# Patient Record
Sex: Male | Born: 1970 | Race: Black or African American | Hispanic: No | Marital: Single | Smoking: Former smoker
Health system: Southern US, Community
[De-identification: ages and names within clinical notes are randomized; demographics above are authoritative.]

## PROBLEM LIST (undated history)

## (undated) DIAGNOSIS — Z9114 Patient's other noncompliance with medication regimen: Secondary | ICD-10-CM

## (undated) DIAGNOSIS — I340 Nonrheumatic mitral (valve) insufficiency: Secondary | ICD-10-CM

## (undated) DIAGNOSIS — T148XXA Other injury of unspecified body region, initial encounter: Secondary | ICD-10-CM

## (undated) DIAGNOSIS — I428 Other cardiomyopathies: Secondary | ICD-10-CM

## (undated) DIAGNOSIS — N189 Chronic kidney disease, unspecified: Secondary | ICD-10-CM

## (undated) DIAGNOSIS — K759 Inflammatory liver disease, unspecified: Secondary | ICD-10-CM

## (undated) DIAGNOSIS — Q780 Osteogenesis imperfecta: Secondary | ICD-10-CM

## (undated) DIAGNOSIS — Z993 Dependence on wheelchair: Secondary | ICD-10-CM

## (undated) DIAGNOSIS — I5022 Chronic systolic (congestive) heart failure: Secondary | ICD-10-CM

## (undated) DIAGNOSIS — I10 Essential (primary) hypertension: Secondary | ICD-10-CM

## (undated) DIAGNOSIS — R0600 Dyspnea, unspecified: Secondary | ICD-10-CM

## (undated) DIAGNOSIS — Z91148 Patient's other noncompliance with medication regimen for other reason: Secondary | ICD-10-CM

## (undated) DIAGNOSIS — I272 Pulmonary hypertension, unspecified: Secondary | ICD-10-CM

## (undated) DIAGNOSIS — G4730 Sleep apnea, unspecified: Secondary | ICD-10-CM

## (undated) HISTORY — PX: LEG SURGERY: SHX1003

## (undated) HISTORY — PX: CHOLECYSTECTOMY: SHX55

## (undated) HISTORY — PX: VASCULAR SURGERY: SHX849

---

## 2002-05-10 ENCOUNTER — Encounter: Payer: Self-pay | Admitting: Family Medicine

## 2002-05-10 ENCOUNTER — Encounter: Payer: Self-pay | Admitting: General Surgery

## 2002-05-10 ENCOUNTER — Inpatient Hospital Stay (HOSPITAL_COMMUNITY): Admission: EM | Admit: 2002-05-10 | Discharge: 2002-05-16 | Payer: Self-pay | Admitting: Emergency Medicine

## 2002-05-10 ENCOUNTER — Ambulatory Visit (HOSPITAL_COMMUNITY): Admission: RE | Admit: 2002-05-10 | Discharge: 2002-05-10 | Payer: Self-pay | Admitting: Family Medicine

## 2010-04-20 ENCOUNTER — Emergency Department (HOSPITAL_COMMUNITY)
Admission: EM | Admit: 2010-04-20 | Discharge: 2010-04-20 | Payer: Self-pay | Source: Home / Self Care | Admitting: Emergency Medicine

## 2010-04-20 LAB — POCT I-STAT, CHEM 8
BUN: 17 mg/dL (ref 6–23)
Calcium, Ion: 1.11 mmol/L — ABNORMAL LOW (ref 1.12–1.32)
Creatinine, Ser: 1 mg/dL (ref 0.4–1.5)
Glucose, Bld: 93 mg/dL (ref 70–99)
HCT: 53 % — ABNORMAL HIGH (ref 39.0–52.0)
Hemoglobin: 18 g/dL — ABNORMAL HIGH (ref 13.0–17.0)
Potassium: 3.9 mEq/L (ref 3.5–5.1)
Sodium: 144 mEq/L (ref 135–145)
TCO2: 30 mmol/L (ref 0–100)

## 2011-04-24 ENCOUNTER — Emergency Department (HOSPITAL_COMMUNITY): Payer: Medicare Other

## 2011-04-24 ENCOUNTER — Other Ambulatory Visit: Payer: Self-pay

## 2011-04-24 ENCOUNTER — Emergency Department (HOSPITAL_COMMUNITY)
Admission: EM | Admit: 2011-04-24 | Discharge: 2011-04-24 | Disposition: A | Discharge status: Home / Self Care | Payer: Medicare Other | Attending: Emergency Medicine | Admitting: Emergency Medicine

## 2011-04-24 ENCOUNTER — Encounter (HOSPITAL_COMMUNITY): Payer: Self-pay | Admitting: Emergency Medicine

## 2011-04-24 DIAGNOSIS — E876 Hypokalemia: Secondary | ICD-10-CM | POA: Diagnosis not present

## 2011-04-24 DIAGNOSIS — R0602 Shortness of breath: Secondary | ICD-10-CM | POA: Diagnosis not present

## 2011-04-24 DIAGNOSIS — R42 Dizziness and giddiness: Secondary | ICD-10-CM | POA: Diagnosis not present

## 2011-04-24 DIAGNOSIS — R930 Abnormal findings on diagnostic imaging of skull and head, not elsewhere classified: Secondary | ICD-10-CM | POA: Insufficient documentation

## 2011-04-24 DIAGNOSIS — I517 Cardiomegaly: Secondary | ICD-10-CM | POA: Diagnosis not present

## 2011-04-24 DIAGNOSIS — R51 Headache: Secondary | ICD-10-CM | POA: Insufficient documentation

## 2011-04-24 DIAGNOSIS — I10 Essential (primary) hypertension: Secondary | ICD-10-CM | POA: Insufficient documentation

## 2011-04-24 DIAGNOSIS — H538 Other visual disturbances: Secondary | ICD-10-CM | POA: Insufficient documentation

## 2011-04-24 DIAGNOSIS — R6889 Other general symptoms and signs: Secondary | ICD-10-CM | POA: Insufficient documentation

## 2011-04-24 DIAGNOSIS — I498 Other specified cardiac arrhythmias: Secondary | ICD-10-CM | POA: Insufficient documentation

## 2011-04-24 HISTORY — DX: Essential (primary) hypertension: I10

## 2011-04-24 HISTORY — DX: Osteogenesis imperfecta: Q78.0

## 2011-04-24 LAB — POCT I-STAT, CHEM 8
Calcium, Ion: 1.19 mmol/L (ref 1.12–1.32)
Chloride: 100 mEq/L (ref 96–112)
HCT: 51 % (ref 39.0–52.0)
Potassium: 3.3 mEq/L — ABNORMAL LOW (ref 3.5–5.1)
Sodium: 143 mEq/L (ref 135–145)

## 2011-04-24 LAB — POCT I-STAT TROPONIN I: Troponin i, poc: 0 ng/mL (ref 0.00–0.08)

## 2011-04-24 LAB — URINALYSIS, ROUTINE W REFLEX MICROSCOPIC
Glucose, UA: NEGATIVE mg/dL
Hgb urine dipstick: NEGATIVE
pH: 6 (ref 5.0–8.0)

## 2011-04-24 LAB — URINE MICROSCOPIC-ADD ON

## 2011-04-24 MED ORDER — LORAZEPAM 1 MG PO TABS
1.0000 mg | ORAL_TABLET | Freq: Once | ORAL | Status: AC
Start: 2011-04-24 — End: 2011-04-24
  Administered 2011-04-24: 1 mg via ORAL

## 2011-04-24 MED ORDER — LORAZEPAM 1 MG PO TABS
ORAL_TABLET | ORAL | Status: AC
  Filled 2011-04-24: qty 1

## 2011-04-24 NOTE — ED Notes (Signed)
Pt alert, c/o dizziness, blurred vision, head aches, also c/o nausea, denies emesis, onset a few days ago, resp even unlabored, skin pwd,

## 2011-04-24 NOTE — ED Notes (Signed)
Pt st's he feels dizzy and lightheaded, blurred vision, however pt st's he hasn't taken his bp medications in over one year.  No n/v/d, no chest pain, no sob.  No headache right now, st's he has them off and on.  No abdominal pain

## 2011-04-24 NOTE — ED Notes (Signed)
Pt to MRI

## 2011-04-24 NOTE — ED Provider Notes (Signed)
Pt care assumed from Dr Thurnell Garbe in sign out with MRI pending. MRI does not show any acute abnormality. Pt with no new complaints prior to DC. Renal function normal. Will start on HCTZ given HTN. Further BP management more appropriate for outpt. Discussed importance of med compliance. Pt instructed to increase dietary intake of potassium.    Dg Chest 2 View  04/24/2011  *RADIOLOGY REPORT*  Clinical Data: Shortness of breath  CHEST - 2 VIEW  Comparison: 04/20/2010  Findings: The shallow inspiration.  Heart size and pulmonary vascularity are normal.  No focal airspace consolidation.  No blunting of costophrenic angles.  No pneumothorax.  Tortuous aorta. No significant change since previous study.  IMPRESSION: Mild cardiac enlargement.  No evidence of active pulmonary disease.  Original Report Authenticated By: Neale Burly, M.D.   Ct Head Wo Contrast  04/24/2011  *RADIOLOGY REPORT*  Clinical Data: Dizziness, headache, blurred vision, and lightheadedness.  History of osteogenesis imperfecta.  CT HEAD WITHOUT CONTRAST  Technique:  Contiguous axial images were obtained from the base of the skull through the vertex without contrast.  Comparison: None  Findings: The ventricles and sulci appear symmetrical.  No mass effect or midline shift.  No abnormal extra-axial fluid collections. Suggestion of low attenuation change in the brain stem and cerebellar peduncles.  Mild cerebellar tonsillar ectopia. These changes may represent artifact, but there is suggestion of some effacement of the fourth ventricle and of the basal cisterns which might be due to edema causing mass effect.  Gray-white matter junctions in the cerebral hemispheres are intact.  Ventricles are not dilated.  Suggest MRI for further evaluation.  No depressed skull fractures.  Visualized paranasal sinuses are not opacified.  IMPRESSION: Suggestion of low attenuation change in the brainstem with some mild mass effect on the fourth ventricle and  quadrigeminal cisterns. This could represent artifact but ischemia and edema is not excluded and MRI is recommended for further evaluation.  Results were discussed with Dr. Thurnell Garbe at the time of dictation, 0513 hours on 04/24/2011.  Original Report Authenticated By: Neale Burly, M.D.   Mr Brain Wo Contrast  04/24/2011  *RADIOLOGY REPORT*  Clinical Data: Dizzy.  Headache and lightheadedness.  Abnormal CT scan of the head 04/23/2010.  MRI HEAD WITHOUT CONTRAST  Technique:  Multiplanar, multiecho pulse sequences of the brain and surrounding structures were obtained according to standard protocol without intravenous contrast.  Comparison: None.  Findings: Central pontine T2 and FLAIR white matter hyperintensity corresponds with the finding at CT.  No focal mass lesion is present.  The fourth ventricle is of normal size.  Extensive periventricular and subcortical white matter changes are evident bilaterally.  No hemorrhage or mass lesion is present.  The diffusion weighted images reveal no evidence for acute or subacute infarction.  Marrow signal is somewhat low within the upper cervical spine.  The adenoid tissue is prominent for age.  Flow is present in the major intracranial arteries.  The globes and orbits are intact.  The paranasal sinuses and mastoid air cells are clear.  IMPRESSION:  1.  No acute infarct or significant intracranial abnormality to explain the patient's symptoms. 2.  Diffuse white matter disease.  This is advanced for age. The finding is nonspecific but can be seen in the setting of chronic microvascular ischemia, a demyelinating process such as multiple sclerosis, vasculitis, complicated migraine headaches, or as the sequelae of a prior infectious or inflammatory process. 3.  Prominent adenoid tissue.  No discrete mass lesion is  present. The area should be amendable to direct visualization. Although nonspecific, prominent adenoid tissue can be seen in the setting of HIV. 4.  Decreased T1  marrow signal.  Question anemia.  Original Report Authenticated By: Resa Miner. MATTERN, M.D.    Virgel Manifold, MD 04/24/11 [phone]

## 2011-04-24 NOTE — ED Notes (Addendum)
Patient transported to MRI. Will re-check vitals when pt returns.

## 2011-04-24 NOTE — ED Provider Notes (Signed)
History     CSN: [id number]  Arrival date & time 04/24/11  0243   Chief Complaint  Patient presents with  . Blurred Vision  . Dizziness    HPI Pt was seen at 0445.  Per pt, c/o gradual onset and persistence of constant "high blood pressure" for over the past year.  Pt states he has not taken his BP meds in over 1 year, unk name of last med he took.  States for the past several weeks he has been experiencing intermittent vague headaches, "blurred" vision, and lightheadedness.  Denies symptoms currently.   Denies CP/SOB, no cough, no fevers, no abd pain, no back pain, no focal motor weakness, no tingling/numbness in extremities.    Past Medical History  Diagnosis Date  . Hypertension   . Osteogenesis imperfecta     History reviewed. No pertinent past surgical history.   History  Substance Use Topics  . Smoking status: Never Smoker   . Smokeless tobacco: Not on file  . Alcohol Use: No    Review of Systems ROS: Statement: All systems negative except as marked or noted in the HPI; Constitutional: Negative for fever and chills. ; ; Eyes: Negative for eye pain, redness and discharge. ; ; ENMT: Negative for ear pain, hoarseness, nasal congestion, sinus pressure and sore throat. ; ; Cardiovascular: Negative for chest pain, palpitations, diaphoresis, dyspnea and peripheral edema. ; ; Respiratory: Negative for cough, wheezing and stridor. ; ; Gastrointestinal: Negative for nausea, vomiting, diarrhea, abdominal pain, blood in stool, hematemesis, jaundice and rectal bleeding. . ; ; Genitourinary: Negative for dysuria, flank pain and hematuria. ; ; Musculoskeletal: Negative for back pain and neck pain. Negative for swelling and trauma.; ; Skin: Negative for pruritus, rash, abrasions, blisters, bruising and skin lesion.; ; Neuro: Positive for intermittent headache, lightheadedness and negative for neck stiffness. Negative for weakness, altered level of consciousness , altered mental status,  extremity weakness, paresthesias, involuntary movement, seizure and syncope.      Allergies  Review of patient's allergies indicates no known allergies.  Home Medications  No current outpatient prescriptions on file.  BP 223/143  Pulse 112  Temp(Src) 98.7 F (37.1 C) (Oral)  SpO2 97%  Physical Exam 0455: Physical examination:  Nursing notes reviewed; Vital signs and O2 SAT reviewed;  Constitutional: Well developed, Well nourished, Well hydrated, In no acute distress; Head:  Normocephalic, atraumatic; Eyes: EOMI, PERRL, No scleral icterus; ENMT: Mouth and pharynx normal, Mucous membranes moist; Neck: Supple, Full range of motion, No lymphadenopathy; Cardiovascular: Regular rate and rhythm, No murmur, rub, or gallop; Respiratory: Breath sounds clear & equal bilaterally, No rales, rhonchi, wheezes, or rub, Normal respiratory effort/excursion; Chest: Nontender, Movement normal; Abdomen: Soft, Nontender, Nondistended, Normal bowel sounds; Genitourinary: No CVA tenderness; Extremities: Pulses normal, No tenderness, No edema, No calf edema or asymmetry.; Neuro: AA&Ox3, Major CN grossly intact.  No facial droop, speech clear, moves ext on stretcher..; Skin: Color normal, Warm, Dry, no rash.    ED Course  Procedures  0520:  Pt not orthostatic.  Will order MRI brain to f/u CT scan findings.    7:19 AM:  MRI brain pending.  Pt was medicated with ativan PO before MRI for anxiety.  Sign out to Dr. Wilson Singer.    MDM  MDM Reviewed: nursing note and vitals Reviewed previous: ECG Interpretation: ECG, labs, x-ray and CT scan    Date: 04/24/2011  Rate: 101  Rhythm: sinus tachycardia  QRS Axis: normal  Intervals: normal  ST/T Wave abnormalities:  normal  Conduction Disutrbances:nonspecific intraventricular conduction delay  Narrative Interpretation:  LVH with repolarization abnormality  Old EKG Reviewed: unchanged; no significant changes from previous EKG dated 04/20/2010.   Results for orders  placed during the hospital encounter of 04/24/11  URINALYSIS, ROUTINE W REFLEX MICROSCOPIC      Component Value Range   Color, Urine AMBER (*) YELLOW    APPearance CLEAR  CLEAR    Specific Gravity, Urine 1.023  1.005 - 1.030    pH 6.0  5.0 - 8.0    Glucose, UA NEGATIVE  NEGATIVE (mg/dL)   Hgb urine dipstick NEGATIVE  NEGATIVE    Bilirubin Urine SMALL (*) NEGATIVE    Ketones, ur TRACE (*) NEGATIVE (mg/dL)   Protein, ur 100 (*) NEGATIVE (mg/dL)   Urobilinogen, UA 1.0  0.0 - 1.0 (mg/dL)   Nitrite NEGATIVE  NEGATIVE    Leukocytes, UA NEGATIVE  NEGATIVE   URINE MICROSCOPIC-ADD ON      Component Value Range   WBC, UA 0-2  <3 (WBC/hpf)   Bacteria, UA FEW (*) RARE    Casts HYALINE CASTS (*) NEGATIVE    Urine-Other MUCOUS PRESENT    POCT I-STAT, CHEM 8      Component Value Range   Sodium 143  135 - 145 (mEq/L)   Potassium 3.3 (*) 3.5 - 5.1 (mEq/L)   Chloride 100  96 - 112 (mEq/L)   BUN 10  6 - 23 (mg/dL)   Creatinine, Ser 0.90  0.50 - 1.35 (mg/dL)   Glucose, Bld 81  70 - 99 (mg/dL)   Calcium, Ion 1.19  1.12 - 1.32 (mmol/L)   TCO2 26  0 - 100 (mmol/L)   Hemoglobin 17.3 (*) 13.0 - 17.0 (g/dL)   HCT 51.0  39.0 - 52.0 (%)  POCT I-STAT TROPONIN I      Component Value Range   Troponin i, poc 0.00  0.00 - 0.08 (ng/mL)   Comment 3            Dg Chest 2 View 04/24/2011  *RADIOLOGY REPORT*  Clinical Data: Shortness of breath  CHEST - 2 VIEW  Comparison: 04/20/2010  Findings: The shallow inspiration.  Heart size and pulmonary vascularity are normal.  No focal airspace consolidation.  No blunting of costophrenic angles.  No pneumothorax.  Tortuous aorta. No significant change since previous study.  IMPRESSION: Mild cardiac enlargement.  No evidence of active pulmonary disease.  Original Report Authenticated By: Neale Burly, M.D.    Ct Head Wo Contrast 04/24/2011  *RADIOLOGY REPORT*  Clinical Data: Dizziness, headache, blurred vision, and lightheadedness.  History of osteogenesis imperfecta.   CT HEAD WITHOUT CONTRAST  Technique:  Contiguous axial images were obtained from the base of the skull through the vertex without contrast.  Comparison: None  Findings: The ventricles and sulci appear symmetrical.  No mass effect or midline shift.  No abnormal extra-axial fluid collections. Suggestion of low attenuation change in the brain stem and cerebellar peduncles.  Mild cerebellar tonsillar ectopia. These changes may represent artifact, but there is suggestion of some effacement of the fourth ventricle and of the basal cisterns which might be due to edema causing mass effect.  Gray-white matter junctions in the cerebral hemispheres are intact.  Ventricles are not dilated.  Suggest MRI for further evaluation.  No depressed skull fractures.  Visualized paranasal sinuses are not opacified.  IMPRESSION: Suggestion of low attenuation change in the brainstem with some mild mass effect on the fourth ventricle and quadrigeminal cisterns. This  could represent artifact but ischemia and edema is not excluded and MRI is recommended for further evaluation.  Results were discussed with Dr. Thurnell Garbe at the time of dictation, 0513 hours on 04/24/2011.  Original Report Authenticated By: Neale Burly, M.D.              [facility] [address] Darnelle Spangle, DO 04/24/11 1958

## 2011-04-25 LAB — URINE CULTURE
Colony Count: NO GROWTH
Culture  Setup Time: 201302011011

## 2011-05-26 ENCOUNTER — Emergency Department (HOSPITAL_COMMUNITY)
Admission: EM | Admit: 2011-05-26 | Discharge: 2011-05-27 | Disposition: A | Discharge status: Home / Self Care | Payer: Medicare Other | Attending: Emergency Medicine | Admitting: Emergency Medicine

## 2011-05-26 ENCOUNTER — Other Ambulatory Visit: Payer: Self-pay

## 2011-05-26 ENCOUNTER — Emergency Department (HOSPITAL_COMMUNITY): Payer: Medicare Other

## 2011-05-26 ENCOUNTER — Encounter (HOSPITAL_COMMUNITY): Payer: Self-pay | Admitting: Emergency Medicine

## 2011-05-26 DIAGNOSIS — S20219A Contusion of unspecified front wall of thorax, initial encounter: Secondary | ICD-10-CM | POA: Diagnosis not present

## 2011-05-26 DIAGNOSIS — I498 Other specified cardiac arrhythmias: Secondary | ICD-10-CM | POA: Insufficient documentation

## 2011-05-26 DIAGNOSIS — I10 Essential (primary) hypertension: Secondary | ICD-10-CM | POA: Diagnosis not present

## 2011-05-26 DIAGNOSIS — R079 Chest pain, unspecified: Secondary | ICD-10-CM | POA: Insufficient documentation

## 2011-05-26 DIAGNOSIS — X58XXXA Exposure to other specified factors, initial encounter: Secondary | ICD-10-CM | POA: Insufficient documentation

## 2011-05-26 DIAGNOSIS — Q780 Osteogenesis imperfecta: Secondary | ICD-10-CM | POA: Diagnosis not present

## 2011-05-26 LAB — URINALYSIS, ROUTINE W REFLEX MICROSCOPIC
Nitrite: NEGATIVE
Specific Gravity, Urine: 1.028 (ref 1.005–1.030)
Urobilinogen, UA: 1 mg/dL (ref 0.0–1.0)

## 2011-05-26 LAB — POCT I-STAT, CHEM 8
Calcium, Ion: 1.23 mmol/L (ref 1.12–1.32)
Chloride: 105 mEq/L (ref 96–112)
Creatinine, Ser: 0.9 mg/dL (ref 0.50–1.35)
Glucose, Bld: 106 mg/dL — ABNORMAL HIGH (ref 70–99)
HCT: 45 % (ref 39.0–52.0)

## 2011-05-26 LAB — CBC
MCH: 28.6 pg (ref 26.0–34.0)
Platelets: 241 10*3/uL (ref 150–400)
RBC: 4.72 MIL/uL (ref 4.22–5.81)
WBC: 8 10*3/uL (ref 4.0–10.5)

## 2011-05-26 LAB — BASIC METABOLIC PANEL
GFR calc Af Amer: >90 mL/min (ref >90)
GFR calc non Af Amer: >90 mL/min (ref >90)
Glucose, Bld: 103 mg/dL — ABNORMAL HIGH (ref 70–99)
Potassium: 3.8 mEq/L (ref 3.5–5.1)
Sodium: 141 mEq/L (ref 135–145)

## 2011-05-26 LAB — URINE MICROSCOPIC-ADD ON

## 2011-05-26 LAB — DIFFERENTIAL
Basophils Relative: 0 % (ref 0–1)
Eosinophils Absolute: 0.2 10*3/uL (ref 0.0–0.7)
Lymphs Abs: 3.4 10*3/uL (ref 0.7–4.0)
Neutro Abs: 3.6 10*3/uL (ref 1.7–7.7)
Neutrophils Relative %: 45 % (ref 43–77)

## 2011-05-26 MED ORDER — KETOROLAC TROMETHAMINE 30 MG/ML IJ SOLN
30.0000 mg | Freq: Once | INTRAMUSCULAR | Status: AC
  Administered 2011-05-26: 30 mg via INTRAVENOUS
  Filled 2011-05-26: qty 1

## 2011-05-26 MED ORDER — HYDRALAZINE HCL 20 MG/ML IJ SOLN
5.0000 mg | Freq: Once | INTRAMUSCULAR | Status: AC
  Administered 2011-05-26: 5 mg via INTRAVENOUS
  Filled 2011-05-26: qty 0.25

## 2011-05-26 NOTE — ED Notes (Signed)
MD at bedside. Dr. Randal Buba at bedside.

## 2011-05-26 NOTE — ED Provider Notes (Signed)
History     CSN: [id number]  Arrival date & time 05/26/11  2219   First MD Initiated Contact with Patient 05/26/11 2306      Chief Complaint  Patient presents with  . Abdominal Pain    (Consider location/radiation/quality/duration/timing/severity/associated sxs/prior treatment) Patient is a 41 y.o. male presenting with chest pain. The history is provided by the patient. No language interpreter was used.  Chest Pain The chest pain began 1 - 2 weeks ago. Duration of episode(s) is 7 days. Chest pain occurs constantly. The chest pain is unchanged. Associated with: nothing. At its most intense, the pain is at 10/10. The pain is currently at 10/10. The severity of the pain is severe. The quality of the pain is described as sharp (last 3 ribs anterior axillary line are tender to palpation). The pain does not radiate. Exacerbated by: movement and touching the area. Pertinent negatives for primary symptoms include no fever, no fatigue, no syncope, no shortness of breath, no cough, no wheezing, no palpitations, no abdominal pain, no nausea, no vomiting, no dizziness and no altered mental status.  Pertinent negatives for associated symptoms include no claudication and no diaphoresis. He tried nothing for the symptoms. Risk factors include male gender.  Pertinent negatives for past medical history include no COPD, no CHF and no diabetes. Past medical history comments: osteogenesis imperfecta  Pertinent negatives for family medical history include: no Marfan's syndrome in family.  Procedure history is negative for cardiac catheterization.     Past Medical History  Diagnosis Date  . Hypertension   . Osteogenesis imperfecta     History reviewed. No pertinent past surgical history.  No family history on file.  History  Substance Use Topics  . Smoking status: Never Smoker   . Smokeless tobacco: Not on file  . Alcohol Use: No      Review of Systems  Constitutional: Negative for fever,  diaphoresis and fatigue.  HENT: Negative.   Eyes: Negative.   Respiratory: Negative for cough, chest tightness, shortness of breath and wheezing.   Cardiovascular: Positive for chest pain. Negative for palpitations, claudication and syncope.  Gastrointestinal: Negative for nausea, vomiting and abdominal pain.  Genitourinary: Negative.   Musculoskeletal: Negative.   Skin: Negative.   Neurological: Negative for dizziness.  Hematological: Negative.   Psychiatric/Behavioral: Negative.  Negative for altered mental status.    Allergies  Review of patient's allergies indicates no known allergies.  Home Medications   Current Outpatient Rx  Name Route Sig Dispense Refill  . IBUPROFEN 200 MG PO TABS Oral Take 800 mg by mouth every 8 (eight) hours as needed. For pain.      BP 207/126  Pulse 98  Temp 98 F (36.7 C)  Resp 16  SpO2 93%  Physical Exam  Constitutional: He is oriented to person, place, and time. He appears well-developed and well-nourished. No distress.  HENT:  Head: Normocephalic and atraumatic.  Mouth/Throat: Oropharynx is clear and moist.  Eyes: Conjunctivae are normal. Pupils are equal, round, and reactive to light.  Neck: Normal range of motion. Neck supple.  Cardiovascular: Normal rate and regular rhythm.   Pulmonary/Chest: Effort normal and breath sounds normal. He has no wheezes. He has no rales. He exhibits tenderness.  Abdominal: Soft. Bowel sounds are normal. There is no tenderness. There is no rebound and no guarding.  Musculoskeletal: Normal range of motion. He exhibits no edema.  Neurological: He is alert and oriented to person, place, and time.  Skin: Skin is warm and  dry.  Psychiatric: He has a normal mood and affect.    ED Course  Procedures (including critical care time)  Labs Reviewed  URINALYSIS, ROUTINE W REFLEX MICROSCOPIC - Abnormal; Notable for the following:    Protein, ur 30 (*)    All other components within normal limits  POCT I-STAT,  CHEM 8 - Abnormal; Notable for the following:    Glucose, Bld 106 (*)    All other components within normal limits  URINE MICROSCOPIC-ADD ON  CBC  DIFFERENTIAL  BASIC METABOLIC PANEL  D-DIMER, QUANTITATIVE   No results found.   No diagnosis found.    MDM   Date: 05/27/2011  Rate: 103  Rhythm: sinus tachycardia  QRS Axis: normal  Intervals: normal  ST/T Wave abnormalities: nonspecific ST changes  Conduction Disutrbances:none  Narrative Interpretation: LVH  Old EKG Reviewed: unchanged   Return for CP, shortness of breath, cough, vomiting fevers or any concerns.  Follow up with a family doctor.  Patient verbalizes understanding and agrees to follow up      Issa Luster Alfonso Patten, MD 05/27/11 0202

## 2011-05-26 NOTE — ED Notes (Signed)
Patient transported to X-ray 

## 2011-05-26 NOTE — ED Notes (Signed)
Pt alert, nad, c/o left sided abd pain, onset a week ago, denies changes in bowel or bladder, denies trauma or injury, states pain radiates from side to abd, described as a cramp

## 2011-05-26 NOTE — ED Notes (Signed)
Pt states he has L sided rib pain which feels like when he broke a rib before. Pt denies trauma to rib.

## 2011-05-27 LAB — D-DIMER, QUANTITATIVE: D-Dimer, Quant: 0.22 ug{FEU}/mL (ref 0.00–0.48)

## 2011-05-27 MED ORDER — OXYCODONE-ACETAMINOPHEN 5-325 MG PO TABS: 1.0000 | ORAL_TABLET | Freq: Four times a day (QID) | ORAL | Status: AC | PRN

## 2011-05-27 MED ORDER — AMLODIPINE BESYLATE 2.5 MG PO TABS: 5.0000 mg | ORAL_TABLET | Freq: Every day | ORAL | Status: DC

## 2011-05-27 MED ORDER — HYDRALAZINE HCL 20 MG/ML IJ SOLN
5.0000 mg | Freq: Once | INTRAMUSCULAR | Status: AC
  Administered 2011-05-27: 5 mg via INTRAVENOUS
  Filled 2011-05-27: qty 0.25

## 2011-05-27 NOTE — Discharge Instructions (Signed)
Chest Contusion A contusion is a deep bruise. Bruises happen when an injury causes bleeding under the skin. Signs of bruising include pain, puffiness (swelling), and discolored skin. The bruise may turn blue, purple, or yellow. Pay attention to how you are doing. HOME CARE  Put ice on the injured area.   Put ice in a plastic bag.   Place a towel between the skin and the bag.   Leave the ice on for 15 to 20 minutes at a time, 3 to 4 times a day for the first 48 hours.   Rest.   Do not lift anything heavy.   Limit your activity as told by your doctor   Take 3 to 4 deep breaths every hour while awake. Hold your hand or a pillow over the sore area for support.   Breathe from the belly (abdomen).   Breathe in through the nose, as if you are smelling a flower.   Breathe out through the mouth, as if you are blowing out a candle.   Only take medicine as told by your doctor.  GET HELP RIGHT AWAY IF:   You have trouble breathing or cough up thick spit (mucus).   You have chest pain that goes into the arms or jaw.   The skin is wet and pale.   You have a fever.   You feel dizzy, weak, or pass out (faint).   You cannot breathe easily.   The bruise is getting worse.  MAKE SURE YOU:   Understand these instructions.   Will watch your condition.   Will get help right away if you are not doing well or get worse.  Document Released: 08/26/2007 Document Revised: 02/26/2011 Document Reviewed: 08/26/2007 [facility] Patient Information 2012 [place].

## 2011-05-27 NOTE — ED Notes (Signed)
MD at bedside. Dr. Randal Buba at bedside.

## 2012-01-16 ENCOUNTER — Encounter (HOSPITAL_COMMUNITY): Payer: Self-pay | Admitting: *Deleted

## 2012-01-16 ENCOUNTER — Emergency Department (HOSPITAL_COMMUNITY)
Admission: EM | Admit: 2012-01-16 | Discharge: 2012-01-17 | Disposition: A | Discharge status: Home / Self Care | Payer: Medicare Other | Attending: Emergency Medicine | Admitting: Emergency Medicine

## 2012-01-16 DIAGNOSIS — J029 Acute pharyngitis, unspecified: Secondary | ICD-10-CM

## 2012-01-16 DIAGNOSIS — Q780 Osteogenesis imperfecta: Secondary | ICD-10-CM | POA: Diagnosis not present

## 2012-01-16 DIAGNOSIS — I10 Essential (primary) hypertension: Secondary | ICD-10-CM | POA: Diagnosis not present

## 2012-01-16 DIAGNOSIS — R509 Fever, unspecified: Secondary | ICD-10-CM | POA: Diagnosis not present

## 2012-01-16 MED ORDER — IBUPROFEN 800 MG PO TABS: 800.0000 mg | ORAL_TABLET | Freq: Three times a day (TID) | ORAL | Status: DC

## 2012-01-16 MED ORDER — ACETAMINOPHEN 325 MG PO TABS
650.0000 mg | ORAL_TABLET | Freq: Once | ORAL | Status: AC
  Administered 2012-01-17: 650 mg via ORAL
  Filled 2012-01-16: qty 1

## 2012-01-16 MED ORDER — AMOXICILLIN 500 MG PO CAPS: 500.0000 mg | ORAL_CAPSULE | Freq: Three times a day (TID) | ORAL | Status: DC

## 2012-01-16 MED ORDER — CEFTRIAXONE SODIUM 1 G IJ SOLR
1.0000 g | Freq: Once | INTRAMUSCULAR | Status: AC
  Administered 2012-01-17: 1 g via INTRAMUSCULAR
  Filled 2012-01-16: qty 10

## 2012-01-16 NOTE — ED Notes (Signed)
Sore throat since Thursday. Fever and chills starting today.

## 2012-01-16 NOTE — ED Provider Notes (Signed)
History     CSN: [id number]  Arrival date & time 01/16/12  2026   First MD Initiated Contact with Patient 01/16/12 2330      Chief Complaint  Patient presents with  . Sore Throat  . Fever    (Consider location/radiation/quality/duration/timing/severity/associated sxs/prior treatment) HPI Hx per PT, Sore throat and fever, hurts to swallow, has h/o strep throat and feels the same.  No cough, no emesis, no trouble breathing. No rash.  Mod in severity. No known sick contacts. No known alleviating factors Past Medical History  Diagnosis Date  . Hypertension   . Osteogenesis imperfecta     History reviewed. No pertinent past surgical history.  History reviewed. No pertinent family history.  History  Substance Use Topics  . Smoking status: Never Smoker   . Smokeless tobacco: Not on file  . Alcohol Use: Yes     occasionally      Review of Systems  Constitutional: Positive for fever and chills.  HENT: Positive for sore throat. Negative for drooling, mouth sores, neck pain, neck stiffness and voice change.   Eyes: Negative for pain.  Respiratory: Negative for shortness of breath.   Cardiovascular: Negative for chest pain.  Gastrointestinal: Negative for abdominal pain.  Genitourinary: Negative for dysuria.  Musculoskeletal: Negative for back pain.  Skin: Negative for rash.  Neurological: Negative for headaches.  All other systems reviewed and are negative.    Allergies  Peanuts; Shellfish allergy; and Latex  Home Medications   Current Outpatient Rx  Name Route Sig Dispense Refill  . GUAIFENESIN 100 MG/5ML PO SYRP Oral Take 200 mg by mouth 3 (three) times daily as needed.    Marland Kitchen AMLODIPINE BESYLATE 2.5 MG PO TABS Oral Take 2 tablets (5 mg total) by mouth daily. 30 tablet 0  . AMOXICILLIN 500 MG PO CAPS Oral Take 1 capsule (500 mg total) by mouth 3 (three) times daily. 21 capsule 0  . IBUPROFEN 200 MG PO TABS Oral Take 800 mg by mouth every 8 (eight) hours as needed.  For pain.    . IBUPROFEN 800 MG PO TABS Oral Take 1 tablet (800 mg total) by mouth 3 (three) times daily. 21 tablet 0    BP 183/109  Pulse 62  Temp 100.9 F (38.3 C) (Oral)  Resp 22  SpO2 91%  Physical Exam  Constitutional: He is oriented to person, place, and time. He appears well-developed and well-nourished.  HENT:  Head: Normocephalic and atraumatic.       Bilateral tonsillar exudates and swelling, uvula midline. No trismus, mild associated cervical lymphadenopathy.  Eyes: EOM are normal. Pupils are equal, round, and reactive to light.  Neck: Neck supple. No tracheal deviation present.  Cardiovascular: Normal rate, regular rhythm and intact distal pulses.   Pulmonary/Chest: Effort normal. No stridor. No respiratory distress.  Musculoskeletal: Normal range of motion. He exhibits no edema.  Lymphadenopathy:    He has cervical adenopathy.  Neurological: He is alert and oriented to person, place, and time.  Skin: Skin is warm and dry.    ED Course  Procedures (including critical care time)  Tylenol. IM rocephin  1. Pharyngitis     MDM   Pharyngitis with strong h/o strep throat/ treated for same, multiple centor criteria present. VS and nursing notes reviewed. Tylenol and ABx given in ED. RX provided.   Reliable historian agrees to strict return precautions.         Teressa Lower, MD 01/16/12 2350

## 2012-01-17 MED ORDER — IBUPROFEN 800 MG PO TABS
ORAL_TABLET | ORAL | Status: AC
  Administered 2012-01-17: 800 mg
  Filled 2012-01-17: qty 1

## 2012-01-17 NOTE — ED Notes (Signed)
C/o sore throat and fever. Difficulty talking and swallowing x 2 days.

## 2012-01-17 NOTE — ED Notes (Signed)
Patient given discharge instructions, information, prescriptions, and diet order. Patient states that they adequately understand discharge information given and to return to ED if symptoms return or worsen.     

## 2012-01-17 NOTE — ED Notes (Signed)
Will speak with MD Marnette Burgess to make sure he would like patient discharged with elevated temp and elevated heart rate.

## 2012-02-23 ENCOUNTER — Encounter (HOSPITAL_COMMUNITY): Payer: Self-pay | Admitting: Emergency Medicine

## 2012-02-23 ENCOUNTER — Emergency Department (HOSPITAL_COMMUNITY): Payer: Medicare Other

## 2012-02-23 ENCOUNTER — Inpatient Hospital Stay (HOSPITAL_COMMUNITY)
Admission: EM | Admit: 2012-02-23 | Discharge: 2012-02-27 | DRG: 291 | Disposition: A | Discharge status: Home / Self Care | Payer: Medicare Other | Attending: Internal Medicine | Admitting: Internal Medicine

## 2012-02-23 DIAGNOSIS — I059 Rheumatic mitral valve disease, unspecified: Secondary | ICD-10-CM | POA: Diagnosis not present

## 2012-02-23 DIAGNOSIS — J189 Pneumonia, unspecified organism: Secondary | ICD-10-CM | POA: Diagnosis not present

## 2012-02-23 DIAGNOSIS — D649 Anemia, unspecified: Secondary | ICD-10-CM

## 2012-02-23 DIAGNOSIS — F17200 Nicotine dependence, unspecified, uncomplicated: Secondary | ICD-10-CM | POA: Diagnosis not present

## 2012-02-23 DIAGNOSIS — I509 Heart failure, unspecified: Secondary | ICD-10-CM | POA: Diagnosis present

## 2012-02-23 DIAGNOSIS — I5023 Acute on chronic systolic (congestive) heart failure: Secondary | ICD-10-CM | POA: Diagnosis not present

## 2012-02-23 DIAGNOSIS — R0600 Dyspnea, unspecified: Secondary | ICD-10-CM

## 2012-02-23 DIAGNOSIS — Q780 Osteogenesis imperfecta: Secondary | ICD-10-CM | POA: Diagnosis not present

## 2012-02-23 DIAGNOSIS — I10 Essential (primary) hypertension: Secondary | ICD-10-CM | POA: Diagnosis present

## 2012-02-23 DIAGNOSIS — R079 Chest pain, unspecified: Secondary | ICD-10-CM

## 2012-02-23 DIAGNOSIS — I498 Other specified cardiac arrhythmias: Secondary | ICD-10-CM | POA: Diagnosis present

## 2012-02-23 DIAGNOSIS — R0989 Other specified symptoms and signs involving the circulatory and respiratory systems: Secondary | ICD-10-CM | POA: Diagnosis not present

## 2012-02-23 DIAGNOSIS — R0602 Shortness of breath: Secondary | ICD-10-CM | POA: Diagnosis not present

## 2012-02-23 DIAGNOSIS — R0789 Other chest pain: Secondary | ICD-10-CM | POA: Diagnosis not present

## 2012-02-23 HISTORY — DX: Other injury of unspecified body region, initial encounter: T14.8XXA

## 2012-02-23 LAB — CBC
HCT: 35.7 % — ABNORMAL LOW (ref 39.0–52.0)
Hemoglobin: 11 g/dL — ABNORMAL LOW (ref 13.0–17.0)
MCH: 27.7 pg (ref 26.0–34.0)
MCHC: 30.8 g/dL (ref 30.0–36.0)
MCV: 89.9 fL (ref 78.0–100.0)
Platelets: 370 10*3/uL (ref 150–400)
RBC: 3.97 MIL/uL — ABNORMAL LOW (ref 4.22–5.81)
RDW: 13.2 % (ref 11.5–15.5)
WBC: 7.2 10*3/uL (ref 4.0–10.5)

## 2012-02-23 LAB — BASIC METABOLIC PANEL
BUN: 26 mg/dL — ABNORMAL HIGH (ref 6–23)
CO2: 27 mEq/L (ref 19–32)
Calcium: 8.7 mg/dL (ref 8.4–10.5)
Chloride: 106 mEq/L (ref 96–112)
Creatinine, Ser: 1 mg/dL (ref 0.50–1.35)
GFR calc Af Amer: >90 mL/min (ref >90)
GFR calc non Af Amer: >90 mL/min (ref >90)
Glucose, Bld: 79 mg/dL (ref 70–99)
Potassium: 4.2 mEq/L (ref 3.5–5.1)
Sodium: 142 mEq/L (ref 135–145)

## 2012-02-23 LAB — TROPONIN I: Troponin I: <0.3 ng/mL (ref <0.30)

## 2012-02-23 LAB — PRO B NATRIURETIC PEPTIDE: Pro B Natriuretic peptide (BNP): 7441 pg/mL — ABNORMAL HIGH (ref 0–125)

## 2012-02-23 MED ORDER — FUROSEMIDE 10 MG/ML IJ SOLN
40.0000 mg | Freq: Once | INTRAMUSCULAR | Status: AC
  Administered 2012-02-23: 40 mg via INTRAVENOUS
  Filled 2012-02-23: qty 4

## 2012-02-23 MED ORDER — POTASSIUM CHLORIDE CRYS ER 20 MEQ PO TBCR
20.0000 meq | EXTENDED_RELEASE_TABLET | Freq: Once | ORAL | Status: AC
  Administered 2012-02-23: 20 meq via ORAL
  Filled 2012-02-23: qty 1

## 2012-02-23 MED ORDER — AMLODIPINE BESYLATE 5 MG PO TABS
5.0000 mg | ORAL_TABLET | Freq: Every day | ORAL | Status: DC
  Filled 2012-02-23: qty 1

## 2012-02-23 MED ORDER — BIOTENE DRY MOUTH MT LIQD
15.0000 mL | Freq: Two times a day (BID) | OROMUCOSAL | Status: DC
  Administered 2012-02-23 – 2012-02-27 (×7): 15 mL via OROMUCOSAL

## 2012-02-23 MED ORDER — IOHEXOL 300 MG/ML  SOLN
100.0000 mL | Freq: Once | INTRAMUSCULAR | Status: AC | PRN
  Administered 2012-02-23: 100 mL via INTRAVENOUS

## 2012-02-23 MED ORDER — ACETAMINOPHEN 650 MG RE SUPP: 650.0000 mg | Freq: Four times a day (QID) | RECTAL | Status: DC | PRN

## 2012-02-23 MED ORDER — ENOXAPARIN SODIUM 40 MG/0.4ML ~~LOC~~ SOLN
40.0000 mg | Freq: Every day | SUBCUTANEOUS | Status: DC
  Administered 2012-02-24 – 2012-02-26 (×4): 40 mg via SUBCUTANEOUS
  Filled 2012-02-23 (×5): qty 0.4

## 2012-02-23 MED ORDER — ONDANSETRON HCL 4 MG PO TABS: 4.0000 mg | ORAL_TABLET | Freq: Four times a day (QID) | ORAL | Status: DC | PRN

## 2012-02-23 MED ORDER — ONDANSETRON HCL 4 MG/2ML IJ SOLN: 4.0000 mg | Freq: Three times a day (TID) | INTRAMUSCULAR | Status: AC | PRN

## 2012-02-23 MED ORDER — DEXTROSE 5 % IV SOLN
1.0000 g | Freq: Once | INTRAVENOUS | Status: DC
  Filled 2012-02-23: qty 10

## 2012-02-23 MED ORDER — FUROSEMIDE 10 MG/ML IJ SOLN
40.0000 mg | Freq: Two times a day (BID) | INTRAMUSCULAR | Status: DC
  Administered 2012-02-24 (×3): 40 mg via INTRAVENOUS
  Filled 2012-02-23 (×5): qty 4

## 2012-02-23 MED ORDER — NITROGLYCERIN IN D5W 200-5 MCG/ML-% IV SOLN
2.0000 ug/min | Freq: Once | INTRAVENOUS | Status: AC
  Administered 2012-02-23: 5 ug/min via INTRAVENOUS
  Filled 2012-02-23: qty 250

## 2012-02-23 MED ORDER — ONDANSETRON HCL 4 MG/2ML IJ SOLN: 4.0000 mg | Freq: Four times a day (QID) | INTRAMUSCULAR | Status: DC | PRN

## 2012-02-23 MED ORDER — ASPIRIN 81 MG PO CHEW
324.0000 mg | CHEWABLE_TABLET | Freq: Once | ORAL | Status: AC
  Administered 2012-02-23: 324 mg via ORAL
  Filled 2012-02-23: qty 4

## 2012-02-23 MED ORDER — DEXTROSE 5 % IV SOLN: 500.0000 mg | Freq: Once | INTRAVENOUS | Status: DC

## 2012-02-23 MED ORDER — SODIUM CHLORIDE 0.9 % IJ SOLN
3.0000 mL | Freq: Two times a day (BID) | INTRAMUSCULAR | Status: DC
Start: 2012-02-23 — End: 2012-02-27
  Administered 2012-02-24 – 2012-02-26 (×2): 3 mL via INTRAVENOUS

## 2012-02-23 MED ORDER — ASPIRIN EC 325 MG PO TBEC
325.0000 mg | DELAYED_RELEASE_TABLET | Freq: Every day | ORAL | Status: DC
  Administered 2012-02-24 – 2012-02-27 (×4): 325 mg via ORAL
  Filled 2012-02-23 (×4): qty 1

## 2012-02-23 MED ORDER — SODIUM CHLORIDE 0.9 % IJ SOLN
3.0000 mL | Freq: Two times a day (BID) | INTRAMUSCULAR | Status: DC
  Administered 2012-02-24 – 2012-02-27 (×5): 3 mL via INTRAVENOUS

## 2012-02-23 MED ORDER — ACETAMINOPHEN 325 MG PO TABS: 650.0000 mg | ORAL_TABLET | Freq: Four times a day (QID) | ORAL | Status: DC | PRN

## 2012-02-23 MED ORDER — NITROGLYCERIN IN D5W 200-5 MCG/ML-% IV SOLN
2.0000 ug/min | INTRAVENOUS | Status: DC
  Administered 2012-02-23: 25 ug/min via INTRAVENOUS

## 2012-02-23 MED ORDER — FUROSEMIDE 10 MG/ML IJ SOLN
20.0000 mg | Freq: Once | INTRAMUSCULAR | Status: AC
  Administered 2012-02-23: 20 mg via INTRAVENOUS
  Filled 2012-02-23: qty 4

## 2012-02-23 NOTE — ED Provider Notes (Signed)
History    41 year old male with chest pain. Patient describes a sensation of chest tightness. Onset approximately 3 days ago. Waxes and wanes but does not completely go way. No appreciable exacerbating relieving factors. Mild shortness of breath. Feels like his heart is racing. No dizziness or lightheadedness. Reports bilateral lower extremity swelling which is unusual for him. No cough. No fevers or chills. Denies history of blood clot. Has a history of osteogenesis imperfecta and essentially nonambulatory.  CSN: [id number]  Arrival date & time 02/23/12  1522   First MD Initiated Contact with Patient 02/23/12 1610      Chief Complaint  Patient presents with  . Chest Pain  . Shortness of Breath    (Consider location/radiation/quality/duration/timing/severity/associated sxs/prior treatment) HPI  Past Medical History  Diagnosis Date  . Hypertension   . Osteogenesis imperfecta     History reviewed. No pertinent past surgical history.  No family history on file.  History  Substance Use Topics  . Smoking status: Never Smoker   . Smokeless tobacco: Not on file  . Alcohol Use: Yes     Comment: occasionally      Review of Systems   Review of symptoms negative unless otherwise noted in HPI.   Allergies  Peanuts; Shellfish allergy; and Latex  Home Medications   Current Outpatient Rx  Name  Route  Sig  Dispense  Refill  . AMLODIPINE BESYLATE 2.5 MG PO TABS   Oral   Take 2 tablets (5 mg total) by mouth daily.   30 tablet   0   . IBUPROFEN 200 MG PO TABS   Oral   Take 800 mg by mouth every 8 (eight) hours as needed. For pain.           BP 192/117  Pulse 106  Temp 98.1 F (36.7 C) (Oral)  Resp 18  SpO2 91%  Physical Exam  Nursing note and vitals reviewed. Constitutional: He appears well-developed and well-nourished. No distress.  HENT:  Head: Normocephalic and atraumatic.  Eyes: Conjunctivae normal are normal. Right eye exhibits no discharge. Left eye  exhibits no discharge.  Neck: Neck supple.  Cardiovascular: Normal rate, regular rhythm and normal heart sounds.  Exam reveals no gallop and no friction rub.   No murmur heard. Pulmonary/Chest: Breath sounds normal. No respiratory distress. He exhibits no tenderness.       Mild tachypnea.  Abdominal: Soft. He exhibits no distension. There is no tenderness.  Musculoskeletal: He exhibits edema. He exhibits no tenderness.       Chronic appearing deformities to bilateral lower extremities. Bilateral lower extremity edema.  Neurological: He is alert.  Skin: Skin is warm and dry. He is not diaphoretic.  Psychiatric: He has a normal mood and affect. His behavior is normal. Thought content normal.    ED Course  Procedures (including critical care time)  Labs Reviewed  CBC - Abnormal; Notable for the following:    RBC 3.97 (*)     Hemoglobin 11.0 (*)     HCT 35.7 (*)     All other components within normal limits  BASIC METABOLIC PANEL - Abnormal; Notable for the following:    BUN 26 (*)     All other components within normal limits  TROPONIN I  PRO B NATRIURETIC PEPTIDE   Dg Chest 2 View  02/23/2012  *RADIOLOGY REPORT*  Clinical Data: Chest pain and shortness of breath  CHEST - 2 VIEW  Comparison: May 26, 2011.  Findings: There is essentially stable enlargement  of the cardiac silhouette.  Pulmonary vascularity is normal.  Lungs clear.  No adenopathy.  Bony structures appear stable without focal lesions seen.  IMPRESSION: Stable cardiac enlargement.  The appearance of the cardiac silhouette raises concern for potential pericardial effusion. Echocardiography could be helpful in this regard as clinically indicated.  No edema or consolidation.   Original Report Authenticated By: Lowella Grip, M.D.    Ct Angio Chest W/cm &/or Wo Cm  02/23/2012  *RADIOLOGY REPORT*  Clinical Data: Chest pain and shortness of breath. History of osteogenesis imperfecta.  CT ANGIOGRAPHY CHEST  Technique:   Multidetector CT imaging of the chest using the standard protocol during bolus administration of intravenous contrast. Multiplanar reconstructed images including MIPs were obtained and reviewed to evaluate the vascular anatomy.  Contrast: 123mL OMNIPAQUE IOHEXOL 300 MG/ML  SOLN  Comparison: Plain films of the chest earlier this same day 04/24/2011.  Findings: No pulmonary embolus is identified.  The patient has marked cardiomegaly.  There is reflux of contrast material into the inferior vena cava compatible with right heart insufficiency.  Very small pericardial effusion is present.  Trace right pleural effusion is also noted.  A right hilar lymph node measures 1.7 x 2.6 cm on image 26.  No other enlarged lymph nodes are identified.  Lungs demonstrate scattered ground-glass attenuation.  Small foci of airspace disease are identified in the upper lobes bilaterally. There is diffuse body wall edema.  Incidentally imaged upper abdomen shows a small amount of perihepatic ascites.  Visualized intra-abdominal contents are otherwise unremarkable.  Bones demonstrate compression fracture deformities at all visualized levels of the spine consistent with the patient's history of osteogenesis imperfecta.  IMPRESSION:  1.  Negative for pulmonary embolus. 2.  Scattered ground-glass attenuation in patient with marked cardiomegaly most consistent with interstitial edema. 3.  Small foci airspace opacity in the upper lobes bilaterally, more prominent on the left, could be due to atelectasis or bronchopneumonia. 4.  Single 1.7 cm right hilar lymph node is nonspecific but likely reactive. 5.  Trace amount of perihepatic ascites of indeterminate etiology. 6.  Multiple thoracic spine compression fractures consistent with history of osteogenesis imperfecta.   Original Report Authenticated By: Orlean Patten, M.D.    EKG:  Rhythm: sinus tach Vent. rate 102 BPM PR interval 160 ms QRS duration 92 ms QT/QTc 368/479 ms LAE ST  segments: NS ST changes   1. Dyspnea   2. CHF (congestive heart failure)   3. Anemia   4. Chest pain   5. Osteogenesis imperfecta   6. HTN (hypertension), malignant   7. CAP (community acquired pneumonia)   80. Acute on chronic systolic CHF (congestive heart failure)       MDM  41yM with dyspnea. Pt with likely undiagnosed HF. Significant hypertension. LVH on review of EKGs.  Possible R heart failure based on CT findings.  Pt with no PCP or reliable follow-up. Only BP medication that is on was prescribed by ER physician. He is mildly tachypneic and hypoxic on RA. Will gently diurese and start on nitro gtt. Will discuss with hospitalist. Questionable pneumonia on CT. I feel infectious process is less likely. Will defer from abx at this time.        Virgel Manifold, MD 02/27/12 [id number]
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
## 2012-02-23 NOTE — ED Notes (Signed)
Pt BP still elevated, still c/o of chest pain, no headache at the moment. Nitroglycerin titrated to 10 mcg.

## 2012-02-23 NOTE — ED Notes (Signed)
Pt presents to ED with c/o centralized chest pain and SOB x3 days.  Pt also reports hot flashes, leg swelling, and dry cough.  Pt states "my heart has been beating really weird, speeding up and slowing down. I came to get checked out because I don't feel good."
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
## 2012-02-24 DIAGNOSIS — I509 Heart failure, unspecified: Secondary | ICD-10-CM

## 2012-02-24 DIAGNOSIS — I059 Rheumatic mitral valve disease, unspecified: Secondary | ICD-10-CM

## 2012-02-24 DIAGNOSIS — R0609 Other forms of dyspnea: Secondary | ICD-10-CM

## 2012-02-24 LAB — CBC WITH DIFFERENTIAL/PLATELET
Basophils Absolute: 0 10*3/uL (ref 0.0–0.1)
HCT: 37.3 % — ABNORMAL LOW (ref 39.0–52.0)
Lymphocytes Relative: 25 % (ref 12–46)
Lymphs Abs: 1.9 10*3/uL (ref 0.7–4.0)
MCV: 90.3 fL (ref 78.0–100.0)
Monocytes Absolute: 0.9 10*3/uL (ref 0.1–1.0)
Neutro Abs: 4.5 10*3/uL (ref 1.7–7.7)
Platelets: 362 10*3/uL (ref 150–400)
RBC: 4.13 MIL/uL — ABNORMAL LOW (ref 4.22–5.81)
RDW: 13.4 % (ref 11.5–15.5)
WBC: 7.4 10*3/uL (ref 4.0–10.5)

## 2012-02-24 LAB — COMPREHENSIVE METABOLIC PANEL
Albumin: 2.9 g/dL — ABNORMAL LOW (ref 3.5–5.2)
Alkaline Phosphatase: 60 U/L (ref 39–117)
BUN: 22 mg/dL (ref 6–23)
Creatinine, Ser: 0.93 mg/dL (ref 0.50–1.35)
GFR calc Af Amer: >90 mL/min (ref >90)
Glucose, Bld: 88 mg/dL (ref 70–99)
Potassium: 3.3 mEq/L — ABNORMAL LOW (ref 3.5–5.1)
Total Protein: 7 g/dL (ref 6.0–8.3)

## 2012-02-24 LAB — FOLATE: Folate: 12.5 ng/mL

## 2012-02-24 LAB — TSH: TSH: 1.802 u[IU]/mL (ref 0.350–4.500)

## 2012-02-24 LAB — PRO B NATRIURETIC PEPTIDE: Pro B Natriuretic peptide (BNP): 7123 pg/mL — ABNORMAL HIGH (ref 0–125)

## 2012-02-24 LAB — VITAMIN B12: Vitamin B-12: 810 pg/mL (ref 211–911)

## 2012-02-24 LAB — LIPID PANEL
HDL: 36 mg/dL — ABNORMAL LOW (ref >39)
Total CHOL/HDL Ratio: 3 RATIO
Triglycerides: 54 mg/dL (ref <150)

## 2012-02-24 LAB — IRON AND TIBC
Saturation Ratios: 11 % — ABNORMAL LOW (ref 20–55)
TIBC: 412 ug/dL (ref 215–435)
UIBC: 365 ug/dL (ref 125–400)

## 2012-02-24 LAB — TROPONIN I
Troponin I: <0.3 ng/mL (ref <0.30)
Troponin I: <0.3 ng/mL (ref <0.30)

## 2012-02-24 LAB — POTASSIUM: Potassium: 3.7 mEq/L (ref 3.5–5.1)

## 2012-02-24 MED ORDER — POTASSIUM CHLORIDE CRYS ER 20 MEQ PO TBCR
40.0000 meq | EXTENDED_RELEASE_TABLET | Freq: Every day | ORAL | Status: DC
  Administered 2012-02-24 – 2012-02-25 (×2): 40 meq via ORAL
  Filled 2012-02-24 (×3): qty 2

## 2012-02-24 MED ORDER — LEVOFLOXACIN IN D5W 750 MG/150ML IV SOLN
750.0000 mg | Freq: Every day | INTRAVENOUS | Status: DC
  Administered 2012-02-24: 750 mg via INTRAVENOUS
  Filled 2012-02-24: qty 150

## 2012-02-24 MED ORDER — HYDRALAZINE HCL 20 MG/ML IJ SOLN: 10.0000 mg | Freq: Four times a day (QID) | INTRAMUSCULAR | Status: DC | PRN

## 2012-02-24 MED ORDER — LISINOPRIL 20 MG PO TABS
20.0000 mg | ORAL_TABLET | Freq: Every day | ORAL | Status: DC
  Administered 2012-02-24 – 2012-02-27 (×4): 20 mg via ORAL
  Filled 2012-02-24 (×4): qty 1

## 2012-02-24 MED ORDER — CARVEDILOL 3.125 MG PO TABS
3.1250 mg | ORAL_TABLET | Freq: Two times a day (BID) | ORAL | Status: DC
  Filled 2012-02-24 (×3): qty 1

## 2012-02-24 MED ORDER — CARVEDILOL 6.25 MG PO TABS
6.2500 mg | ORAL_TABLET | Freq: Two times a day (BID) | ORAL | Status: DC
  Administered 2012-02-24 – 2012-02-25 (×4): 6.25 mg via ORAL
  Filled 2012-02-24 (×6): qty 1

## 2012-02-24 MED ORDER — LEVOFLOXACIN IN D5W 750 MG/150ML IV SOLN
750.0000 mg | Freq: Every day | INTRAVENOUS | Status: AC
  Administered 2012-02-24 – 2012-02-25 (×2): 750 mg via INTRAVENOUS
  Filled 2012-02-24 (×2): qty 150
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
## 2012-02-24 NOTE — Progress Notes (Signed)
[facility] PROGRESS NOTE  Jerry Edwards [id number] DOB: June 25, 1970 DOA: 02/23/2012 PCP: No primary provider on file.  Assessment/Plan: Principal Problem:  *CHF (congestive heart failure) Active Problems:  Chest pain  Anemia  Osteogenesis imperfecta    1. Malignant HTN: Patient was found to have a markedly elevated BP of 192/117 at presentation  Associated with chest discomfort, over the past 3 day, as well as shortness of breath. . This is being managed with ivi NTG, Norvasc and Coreg, with improvement overnight Patient has had no chest pain overnight. Will discontinue ivi NTG today, and manage with increased doses of ACE-i/Coreg, as well as prn iv Hydralazine.  2. Congestive Heart Failure: As described above, patient has SOB on presentation. CXR/CTA confirmed cardiomegaly and CHF, likely driven by afterload, in the context of uncontrolled HTN. 12-Lead EKG is devoid of acute ischemic changes, cardiac enzymes are un-elevated. 2D Echocardiogram is pending. Managing with iv Lasix and following BNP. Dr Aletta Edouard provided cardiology consultation, and we are managing as recommended. 3. CAP: This was an incidental finding on CTA, which showed Small foci airspace opacity in the upper lobes bilaterally, more prominent on the left. Patient is afebrile, and wcc is normal. Managing with Levaquin, now day#2.     Code Status: Full Code.  Family Communication:  Disposition Plan: Stable for transfer to telemetry floor.    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 He reports 3 days of orthopnea and PND as well as 2 days of nearly constant chest pressure. He recently had a cold and was taking alka seltzer cold with phenylephrine but stopped this a few days ago. He has previously noted elevated BP but not to the degree that it was elevated here. He received IV lasix in the ED with improvement of his  symptoms. He also underwent CTA of the chest for PE. He was admitted for further management.   Consultants:  N/A  Procedures:  CXR  CTA.   Antibiotics:  Levaquin 02/23/12>>>  HPI/Subjective: Feels better. No chest pain, no SOB.   Objective: Vital signs in last 24 hours: Temp:  [97.7 F (36.5 C)-98.1 F (36.7 C)] 98.1 F (36.7 C) (12/04 0400) Pulse Rate:  [94-113] 111  (12/04 0730) Resp:  [13-31] 24  (12/04 0730) BP: (140-192)/(64-138) 149/64 mmHg (12/04 0730) SpO2:  [91 %-100 %] 94 % (12/04 0730) Weight:  [83.2 kg (183 lb 6.8 oz)] 83.2 kg (183 lb 6.8 oz) (12/04 0145) Weight change:  Last BM Date: 02/22/12  Intake/Output from previous day: 12/03 0701 - 12/04 0700 In: 298.5 [I.V.:58.5; IV Piggyback:160] Out: [id number] [Urine:1825] Total I/O In: 14.5 [I.V.:4.5; Other:10] Out: 300 [Urine:300]   Physical Exam: General: Comfortable, alert, communicative, fully oriented, not short of breath at rest.  HEENT:  No clinical pallor, no jaundice, no conjunctival injection or discharge. Hydration status is fair.  NECK:  Supple, JVP not seen, no carotid bruits, no palpable lymphadenopathy, no palpable goiter. CHEST:  Clinically clear to auscultation, no wheezes, no crackles. HEART:  Sounds 1 and 2 heard, normal, regular, mildly tachycardic, no murmurs. ABDOMEN:  Full, soft, non-tender, no palpable organomegaly, no palpable masses, normal bowel sounds. GENITALIA:  Not examined. LOWER EXTREMITIES:  No pitting edema, palpable peripheral pulses. MUSCULOSKELETAL SYSTEM:  Bilateral LE, shortened/bowed.  CENTRAL NERVOUS SYSTEM:  No focal neurologic deficit on gross examination.  Lab Results:  Basename 02/24/12 0010 02/23/12 1620  WBC 7.4 7.2  HGB 11.6* 11.0*  HCT 37.3*  35.7*  PLT 362 370    Basename 02/24/12 0530 02/24/12 0010 02/23/12 1620  NA 141 -- 142  K 3.3* 3.7 --  CL 102 -- 106  CO2 30 -- 27  GLUCOSE 88 -- 79  BUN 22 -- 26*  CREATININE 0.93 0.99 --  CALCIUM 8.3* --  8.7   Recent Results (from the past 240 hour(s))  MRSA PCR SCREENING     Status: Normal   Collection Time   02/23/12 11:20 PM      Component Value Range Status Comment   MRSA by PCR NEGATIVE  NEGATIVE Final      Studies/Results: Dg Chest 2 View  02/23/2012  *RADIOLOGY REPORT*  Clinical Data: Chest pain and shortness of breath  CHEST - 2 VIEW  Comparison: May 26, 2011.  Findings: There is essentially stable enlargement of the cardiac silhouette.  Pulmonary vascularity is normal.  Lungs clear.  No adenopathy.  Bony structures appear stable without focal lesions seen.  IMPRESSION: Stable cardiac enlargement.  The appearance of the cardiac silhouette raises concern for potential pericardial effusion. Echocardiography could be helpful in this regard as clinically indicated.  No edema or consolidation.   Original Report Authenticated By: Lowella Grip, M.D.    Ct Angio Chest W/cm &/or Wo Cm  02/23/2012  *RADIOLOGY REPORT*  Clinical Data: Chest pain and shortness of breath. History of osteogenesis imperfecta.  CT ANGIOGRAPHY CHEST  Technique:  Multidetector CT imaging of the chest using the standard protocol during bolus administration of intravenous contrast. Multiplanar reconstructed images including MIPs were obtained and reviewed to evaluate the vascular anatomy.  Contrast: 169mL OMNIPAQUE IOHEXOL 300 MG/ML  SOLN  Comparison: Plain films of the chest earlier this same day 04/24/2011.  Findings: No pulmonary embolus is identified.  The patient has marked cardiomegaly.  There is reflux of contrast material into the inferior vena cava compatible with right heart insufficiency.  Very small pericardial effusion is present.  Trace right pleural effusion is also noted.  A right hilar lymph node measures 1.7 x 2.6 cm on image 26.  No other enlarged lymph nodes are identified.  Lungs demonstrate scattered ground-glass attenuation.  Small foci of airspace disease are identified in the upper lobes bilaterally.  There is diffuse body wall edema.  Incidentally imaged upper abdomen shows a small amount of perihepatic ascites.  Visualized intra-abdominal contents are otherwise unremarkable.  Bones demonstrate compression fracture deformities at all visualized levels of the spine consistent with the patient's history of osteogenesis imperfecta.  IMPRESSION:  1.  Negative for pulmonary embolus. 2.  Scattered ground-glass attenuation in patient with marked cardiomegaly most consistent with interstitial edema. 3.  Small foci airspace opacity in the upper lobes bilaterally, more prominent on the left, could be due to atelectasis or bronchopneumonia. 4.  Single 1.7 cm right hilar lymph node is nonspecific but likely reactive. 5.  Trace amount of perihepatic ascites of indeterminate etiology. 6.  Multiple thoracic spine compression fractures consistent with history of osteogenesis imperfecta.   Original Report Authenticated By: Orlean Patten, M.D.     Medications: Scheduled Meds:   . amLODipine  5 mg Oral Daily  . antiseptic oral rinse  15 mL Mouth Rinse BID  . [COMPLETED] aspirin  324 mg Oral Once  . aspirin EC  325 mg Oral Daily  . carvedilol  3.125 mg Oral BID WC  . enoxaparin (LOVENOX) injection  40 mg Subcutaneous QHS  . [COMPLETED] furosemide  20 mg Intravenous Once  . [COMPLETED] furosemide  40 mg Intravenous Once  . furosemide  40 mg Intravenous BID  . [COMPLETED] nitroGLYCERIN  2-200 mcg/min Intravenous Once  . [COMPLETED] potassium chloride  20 mEq Oral Once  . sodium chloride  3 mL Intravenous Q12H  . sodium chloride  3 mL Intravenous Q12H  . [DISCONTINUED] azithromycin (ZITHROMAX) 500 MG IVPB  500 mg Intravenous Once  . [DISCONTINUED] cefTRIAXone (ROCEPHIN)  IV  1 g Intravenous Once  . [DISCONTINUED] levofloxacin (LEVAQUIN) IV  750 mg Intravenous QHS   Continuous Infusions:   . nitroGLYCERIN 15 mcg/min (02/24/12 0800)   PRN Meds:.acetaminophen, acetaminophen, [COMPLETED] iohexol, ondansetron  (ZOFRAN) IV, ondansetron (ZOFRAN) IV, ondansetron    LOS: 1 day   Chuckie Mccathern,CHRISTOPHER  [facility] Pager [phone]. If 8PM-8AM, please contact night-coverage at [email or web address], password [facility] 02/24/2012, 8:53 AM  LOS: 1 day

## 2012-02-24 NOTE — Progress Notes (Signed)
[id number] Rosana Hoes, RN, BSN, CCM: CHART REVIEWED AND UPDATED.  Next chart review due on [id number]. NO DISCHARGE NEEDS PRESENT AT THIS TIME. CASE MANAGEMENT [phone]

## 2012-02-25 DIAGNOSIS — J189 Pneumonia, unspecified organism: Secondary | ICD-10-CM

## 2012-02-25 DIAGNOSIS — I10 Essential (primary) hypertension: Secondary | ICD-10-CM

## 2012-02-25 LAB — BASIC METABOLIC PANEL
Calcium: 8.8 mg/dL (ref 8.4–10.5)
GFR calc Af Amer: >90 mL/min (ref >90)
GFR calc non Af Amer: >90 mL/min (ref >90)
Potassium: 3.2 mEq/L — ABNORMAL LOW (ref 3.5–5.1)
Sodium: 140 mEq/L (ref 135–145)

## 2012-02-25 LAB — POTASSIUM: Potassium: 3.7 mEq/L (ref 3.5–5.1)

## 2012-02-25 LAB — MAGNESIUM: Magnesium: 1.8 mg/dL (ref 1.5–2.5)

## 2012-02-25 LAB — PRO B NATRIURETIC PEPTIDE: Pro B Natriuretic peptide (BNP): 5069 pg/mL — ABNORMAL HIGH (ref 0–125)

## 2012-02-25 LAB — CBC
MCH: 27.7 pg (ref 26.0–34.0)
Platelets: 341 10*3/uL (ref 150–400)
RBC: 4.01 MIL/uL — ABNORMAL LOW (ref 4.22–5.81)
WBC: 7.7 10*3/uL (ref 4.0–10.5)

## 2012-02-25 MED ORDER — SODIUM CHLORIDE 0.9 % IV BOLUS (SEPSIS)
500.0000 mL | Freq: Once | INTRAVENOUS | Status: AC
  Administered 2012-02-25: 500 mL via INTRAVENOUS

## 2012-02-25 MED ORDER — CARVEDILOL 3.125 MG PO TABS
3.1250 mg | ORAL_TABLET | Freq: Two times a day (BID) | ORAL | Status: DC
  Filled 2012-02-25 (×4): qty 1

## 2012-02-25 MED ORDER — HYDRALAZINE HCL 20 MG/ML IJ SOLN
5.0000 mg | Freq: Four times a day (QID) | INTRAMUSCULAR | Status: DC | PRN
  Administered 2012-02-25 – 2012-02-27 (×2): 5 mg via INTRAVENOUS
  Filled 2012-02-25 (×3): qty 1

## 2012-02-25 MED ORDER — LEVOFLOXACIN 500 MG PO TABS
500.0000 mg | ORAL_TABLET | Freq: Every day | ORAL | Status: DC
  Administered 2012-02-26 – 2012-02-27 (×2): 500 mg via ORAL
  Filled 2012-02-25 (×2): qty 1

## 2012-02-25 MED ORDER — FUROSEMIDE 40 MG PO TABS
40.0000 mg | ORAL_TABLET | Freq: Every day | ORAL | Status: DC
  Administered 2012-02-25 – 2012-02-27 (×3): 40 mg via ORAL
  Filled 2012-02-25 (×3): qty 1

## 2012-02-25 MED ORDER — HYDRALAZINE HCL 20 MG/ML IJ SOLN
10.0000 mg | Freq: Four times a day (QID) | INTRAMUSCULAR | Status: DC | PRN
  Administered 2012-02-25: 10 mg via INTRAVENOUS
  Filled 2012-02-25: qty 1

## 2012-02-25 MED ORDER — CARVEDILOL 3.125 MG PO TABS: 3.1250 mg | ORAL_TABLET | Freq: Two times a day (BID) | ORAL | Status: DC

## 2012-02-25 NOTE — Clinical Documentation Improvement (Signed)
CHF DOCUMENTATION CLARIFICATION QUERY  THIS DOCUMENT IS NOT A PERMANENT PART OF THE MEDICAL RECORD  TO RESPOND TO THE THIS QUERY, FOLLOW THE INSTRUCTIONS BELOW:  1. If needed, update documentation for the patient's encounter via the notes activity.  2. Access this query again and click edit on the [employer].  3. After updating, or not, click F2 to complete all highlighted (required) fields concerning your review. Select "additional documentation in the medical record" OR "no additional documentation provided".  4. Click Sign note button.  5. The deficiency will fall out of your In Basket *Please let us know if you are not able to complete this workflow by phone or e-mail (listed below).  Please update your documentation within the medical record to reflect your response to this query.                                                                                    02/25/12  Dear Dr.P Leathie Weich and  Associates,  In a better effort to capture your patient's severity of illness, reflect appropriate length of stay and utilization of resources, a review of the patient medical record has revealed the following indicators the diagnosis of Heart Failure.    Based on your clinical judgment, please clarify and document in a progress note and/or discharge summary the clinical condition associated with the following supporting information:  In responding to this query please exercise your independent judgment.  The fact that a query is asked, does not imply that any particular answer is desired or expected. 02/25/12 progr note "Congestive Heart Failure"... For accute Dx specificity & severity can noted cond be further specified for type & acuity. Thank you   Possible Clinical Conditions? . CHF NOS . Left Heart Failure . Systolic or Diastolic Congestive Heart Failure . Systolic & Diastolic Congestive Heart Failure  . Chronic Systolic or Diastolic Congestive Heart Failure . Chronic Systolic  & Diastolic Congestive Heart Failure . Acute Systolic or Diastolic Congestive Heart Failure . Acute Systolic & Diastolic Congestive Heart Failure  . Acute on Chronic Systolic or Diastolic Congestive Heart Failure . Acute on Chronic Systolic & Diastolic Congestive Heart Failure  . Other Condition . 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 He reports 3 days of orthopnea and PND as well as 2 days of nearly constant chest pressure.".Marland KitchenMarland Kitchen  Diagnostics: BNP: 02/25/12: Pro B Natriuretic peptide (BNP) 5069.0 (*)   Echo results:Study Conclusions: EF: 02/25/12 Progr note.Marland KitchenMarland Kitchen"Left ventricle: The cavity size was mildly dilated. Wall thickness was increased in a pattern of mild LVH. Systolic function was severely reduced. The estimated ejection fraction was in the range of 25% to 30%. Diffuse hypokinesis. Left ventricular diastolic function parameters were normal.".Marland KitchenMarland Kitchen  EKG:02/25/12 Prog note.Marland KitchenMarland Kitchen"EKG: sinus tachy with LVH"...  Radiology: 02/25/12 prog note..."CXR: Cardiomegally with mild edema.".Marland KitchenMarland Kitchen  Treatment: 02/25/12 Progr note.Marland KitchenMarland Kitchen"Coreg 6.25 BID; Lasix 40 IV BID - change to po 40 mg daily ;Lisinopril 20 QD"...    Reviewed: additional documentation in the medical record  Thank You,  Ophelia R  Laurance Flatten, RN, BSN, [place] Certified Clinical Documentation Specialist Pager: [phone]
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
## 2012-02-25 NOTE — Progress Notes (Signed)
Writer informed by monitor tech that pt had a 3.98 second pause on telemetry. Pt's MD present, Dr. Blenda Nicely, and made aware of this pause.  Pt asleep and was awakened by Probation officer and questioned of how he is feeling. Pt stated he was feeling OK and that he was not feeling any "different" than before. Will order K and MG level per MD order and contact Cardiology. Will continue to monitor pt closely.

## 2012-02-25 NOTE — Progress Notes (Signed)
[facility] PROGRESS NOTE  Jerry Edwards [id number] DOB: [birth year] DOA: 02/23/2012 PCP: No primary provider on file.  Assessment/Plan: Principal Problem:  *CHF (congestive heart failure) Active Problems:  Chest pain  Anemia  Osteogenesis imperfecta  HTN (hypertension), malignant    1. Malignant HTN: Patient was found to have a markedly elevated BP of 192/117 at presentation, associated with chest discomfort, over the past 3 days, as well as shortness of breath. This was initially managed with ivi NTG, Norvasc and Coreg, with improvement overnight. As of 02/24/12, patient had no chest pain and BP had improved, so ivi NTG was discontinued on that date, and patient managed with ACE-i/Coreg, adjusted as indicated. BP is normal today. Patient had a single hypotensive episode with iv Hydralazine, so this has been discontinued.  2. Systolic Congestive Heart Failure: As described above, patient has SOB on presentation. CXR/CTA confirmed cardiomegaly and CHF, likely driven by afterload, in the context of uncontrolled HTN. 12-Lead EKG was devoid of acute ischemic changes, cardiac enzymes were un-elevated. 2D Echocardiogram showed mildly dilated LV cavity size, mild LVH, EF of 25% to 30% and diffusehypokinesis. There was mild mitral regurgitation, mildly dilated left atrium, mildly dilated RV and right atrium. Dr Aletta Edouard provided cardiology consultation, and patient was subsequently seen by Dr Acie Fredrickson. We are managing as recommended. Patient has been transitioned to oral Lasix today.  3. CAP: This was an incidental finding on CTA, which showed Small foci airspace opacity in the upper lobes bilaterally, more prominent on the left. Patient is afebrile, and wcc is normal. Managing with iv Levaquin, now day#3. Patient is afebrile, he has no phlegm production or cough, and wcc is normal. Have switched to oral Levaquin for an additional 4 days.     Code Status: Full Code.  Family Communication:   Disposition Plan: Per cardiology.    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 He reports 3 days of orthopnea and PND as well as 2 days of nearly constant chest pressure. He recently had a cold and was taking alka seltzer cold with phenylephrine but stopped this a few days ago. He has previously noted elevated BP but not to the degree that it was elevated here. He received IV lasix in the ED with improvement of his symptoms. He also underwent CTA of the chest for PE. He was admitted for further management.   Consultants:  N/A  Procedures:  CXR  CTA.   Antibiotics:  Levaquin 02/23/12>>>  HPI/Subjective: No new issues.   Objective: Vital signs in last 24 hours: Temp:  [97.6 F (36.4 C)-98.7 F (37.1 C)] 98.7 F (37.1 C) (12/05 1325) Pulse Rate:  [70-100] 89  (12/05 1325) Resp:  [19-27] 21  (12/05 1325) BP: (104-155)/(68-108) 138/76 mmHg (12/05 1325) SpO2:  [94 %-99 %] 99 % (12/05 1325) Weight:  [79.742 kg (175 lb 12.8 oz)-79.8 kg (175 lb 14.8 oz)] 79.742 kg (175 lb 12.8 oz) (12/05 0500) Weight change: -3.4 kg (-7 lb 7.9 oz) Last BM Date: 02/24/12  Intake/Output from previous day: 12/04 0701 - 12/05 0700 In: 717 [I.V.:9; IV Piggyback:658] Out: [id number] [Urine:3275; Stool:1]     Physical Exam: General: Comfortable, alert, communicative, fully oriented, not short of breath at rest.  HEENT:  No clinical pallor, no jaundice, no conjunctival injection or discharge. Hydration status is fair.  NECK:  Supple, JVP not seen, no carotid bruits, no palpable lymphadenopathy, no palpable goiter. CHEST:  Clinically clear to auscultation,  no wheezes, no crackles. HEART:  Sounds 1 and 2 heard, normal, regular, mildly tachycardic, no murmurs. ABDOMEN:  Full, soft, non-tender, no palpable organomegaly, no palpable masses, normal bowel sounds. GENITALIA:  Not examined. LOWER EXTREMITIES:  No pitting  edema, palpable peripheral pulses. MUSCULOSKELETAL SYSTEM:  Bilateral LE, shortened/bowed.  CENTRAL NERVOUS SYSTEM:  No focal neurologic deficit on gross examination.  Lab Results:  Basename 02/25/12 0415 02/24/12 0010  WBC 7.7 7.4  HGB 11.1* 11.6*  HCT 36.0* 37.3*  PLT 341 362    Basename 02/25/12 0415 02/24/12 0530  NA 140 141  K 3.2* 3.3*  CL 102 102  CO2 29 30  GLUCOSE 97 88  BUN 16 22  CREATININE 0.85 0.93  CALCIUM 8.8 8.3*   Recent Results (from the past 240 hour(s))  MRSA PCR SCREENING     Status: Normal   Collection Time   02/23/12 11:20 PM      Component Value Range Status Comment   MRSA by PCR NEGATIVE  NEGATIVE Final      Studies/Results: Dg Chest 2 View  02/23/2012  *RADIOLOGY REPORT*  Clinical Data: Chest pain and shortness of breath  CHEST - 2 VIEW  Comparison: May 26, 2011.  Findings: There is essentially stable enlargement of the cardiac silhouette.  Pulmonary vascularity is normal.  Lungs clear.  No adenopathy.  Bony structures appear stable without focal lesions seen.  IMPRESSION: Stable cardiac enlargement.  The appearance of the cardiac silhouette raises concern for potential pericardial effusion. Echocardiography could be helpful in this regard as clinically indicated.  No edema or consolidation.   Original Report Authenticated By: Lowella Grip, M.D.    Ct Angio Chest W/cm &/or Wo Cm  02/23/2012  *RADIOLOGY REPORT*  Clinical Data: Chest pain and shortness of breath. History of osteogenesis imperfecta.  CT ANGIOGRAPHY CHEST  Technique:  Multidetector CT imaging of the chest using the standard protocol during bolus administration of intravenous contrast. Multiplanar reconstructed images including MIPs were obtained and reviewed to evaluate the vascular anatomy.  Contrast: 156mL OMNIPAQUE IOHEXOL 300 MG/ML  SOLN  Comparison: Plain films of the chest earlier this same day 04/24/2011.  Findings: No pulmonary embolus is identified.  The patient has marked  cardiomegaly.  There is reflux of contrast material into the inferior vena cava compatible with right heart insufficiency.  Very small pericardial effusion is present.  Trace right pleural effusion is also noted.  A right hilar lymph node measures 1.7 x 2.6 cm on image 26.  No other enlarged lymph nodes are identified.  Lungs demonstrate scattered ground-glass attenuation.  Small foci of airspace disease are identified in the upper lobes bilaterally. There is diffuse body wall edema.  Incidentally imaged upper abdomen shows a small amount of perihepatic ascites.  Visualized intra-abdominal contents are otherwise unremarkable.  Bones demonstrate compression fracture deformities at all visualized levels of the spine consistent with the patient's history of osteogenesis imperfecta.  IMPRESSION:  1.  Negative for pulmonary embolus. 2.  Scattered ground-glass attenuation in patient with marked cardiomegaly most consistent with interstitial edema. 3.  Small foci airspace opacity in the upper lobes bilaterally, more prominent on the left, could be due to atelectasis or bronchopneumonia. 4.  Single 1.7 cm right hilar lymph node is nonspecific but likely reactive. 5.  Trace amount of perihepatic ascites of indeterminate etiology. 6.  Multiple thoracic spine compression fractures consistent with history of osteogenesis imperfecta.   Original Report Authenticated By: Orlean Patten, M.D.     Medications: Scheduled  Meds:    . antiseptic oral rinse  15 mL Mouth Rinse BID  . aspirin EC  325 mg Oral Daily  . carvedilol  6.25 mg Oral BID WC  . enoxaparin (LOVENOX) injection  40 mg Subcutaneous QHS  . furosemide  40 mg Oral Daily  . levofloxacin (LEVAQUIN) IV  750 mg Intravenous QHS  . lisinopril  20 mg Oral Daily  . potassium chloride  40 mEq Oral Daily  . [COMPLETED] sodium chloride  500 mL Intravenous Once  . sodium chloride  3 mL Intravenous Q12H  . sodium chloride  3 mL Intravenous Q12H  . [DISCONTINUED]  furosemide  40 mg Intravenous BID   Continuous Infusions:  PRN Meds:.acetaminophen, acetaminophen, hydrALAZINE, ondansetron (ZOFRAN) IV, ondansetron, [DISCONTINUED] hydrALAZINE, [DISCONTINUED] hydrALAZINE    LOS: 2 days   Ermine Spofford,CHRISTOPHER  [facility] Pager [phone]. If 8PM-8AM, please contact night-coverage at [email or web address], password [email or web address] 02/25/2012, 2:29 PM  LOS: 2 days

## 2012-02-25 NOTE — Progress Notes (Signed)
Patient had high BP on 155/104 when arrived to unit around 0020. 10 mg PRN hydralazine given as ordered and about an hour later patient suddenly began to feel very dizzy. No nausea or headache. BP taken at 0137 and was 141/90. NP on call notified and I was instructed to watch the BP. At 0219 patient complained of worsening dizziness and BP had dropped to 104/68. NP on call notified again and a 500 cc bolus was ordered. PRN hydralazine order was also reduced to 5 mg. At completion of the bolus, patient's BP was back up to 138/88. Patient stated that he still felt dizzy so I reassured him that I would keep an eye on his BP. At around 0500, pt was resting comfortably and his BP was 147/88. Will continue to monitor. Marita Snellen

## 2012-02-25 NOTE — Progress Notes (Signed)
Paged Cardiology

## 2012-02-25 NOTE — Progress Notes (Signed)
Cardiologist, Dr. Idolina Primer returned page.  Aware of pause. Orders noted.

## 2012-02-25 NOTE — Progress Notes (Signed)
Received call from nursing that patient had nearly 4 second pause. He was asleep so was asymptomatic. No documented hx of sleep apnea but has osteogenesis imperfecta/tobacco abuse. BP stable. Already got Coreg for today at 6.25mg . Will cut down dose to 3.125mg  BID starting tomorrow morning until MD can review strips. If this is only an isolated pause, may able to continue previous dose of 6.25mg  but will have rounding MD assess HR trend in AM. Melina Copa PA-C

## 2012-02-25 NOTE — Progress Notes (Signed)
Talked to patient about DCP; patient lives alone, depends on friends to assist him when needed; patient does not have a PCP; A list of primary care physicians given to patient that are accepting new patients; also information given to patient about the [facility], [facility], [facility], [facility] and [facility]; patient will decide on which place/ physician to follow up with; Pharmacy of choice is [facility] and patient does not have any problems getting his medication. Encouraged patient to get scales to weigh himself regularly. CM stressed the importance of getting a PCP for continuation of care after hospitalization, patient acknowledged understanding. B Narmeen Kerper RN,BSN,MHA.

## 2012-02-26 DIAGNOSIS — R079 Chest pain, unspecified: Secondary | ICD-10-CM

## 2012-02-26 DIAGNOSIS — I5023 Acute on chronic systolic (congestive) heart failure: Principal | ICD-10-CM

## 2012-02-26 LAB — PRO B NATRIURETIC PEPTIDE: Pro B Natriuretic peptide (BNP): 8246 pg/mL — ABNORMAL HIGH (ref 0–125)

## 2012-02-26 LAB — BASIC METABOLIC PANEL
CO2: 27 mEq/L (ref 19–32)
Calcium: 9.3 mg/dL (ref 8.4–10.5)
Chloride: 101 mEq/L (ref 96–112)
Potassium: 4.2 mEq/L (ref 3.5–5.1)
Sodium: 137 mEq/L (ref 135–145)

## 2012-02-26 LAB — CBC
Platelets: 371 10*3/uL (ref 150–400)
RBC: 4.47 MIL/uL (ref 4.22–5.81)
WBC: 9.2 10*3/uL (ref 4.0–10.5)

## 2012-02-26 MED ORDER — CARVEDILOL 6.25 MG PO TABS
6.2500 mg | ORAL_TABLET | Freq: Two times a day (BID) | ORAL | Status: DC
  Administered 2012-02-26 – 2012-02-27 (×3): 6.25 mg via ORAL
  Filled 2012-02-26 (×5): qty 1

## 2012-02-26 MED ORDER — MENTHOL 3 MG MT LOZG
1.0000 | LOZENGE | OROMUCOSAL | Status: DC | PRN
  Filled 2012-02-26: qty 9

## 2012-02-26 MED ORDER — POTASSIUM CHLORIDE CRYS ER 20 MEQ PO TBCR
20.0000 meq | EXTENDED_RELEASE_TABLET | Freq: Every day | ORAL | Status: DC
  Administered 2012-02-26 – 2012-02-27 (×2): 20 meq via ORAL
  Filled 2012-02-26 (×2): qty 1

## 2012-02-26 NOTE — Progress Notes (Signed)
Pt c/o "sore throat, like the beginning of strep throat", rubbing his throat area and pointing to the area below his ears.  Oral area pink.  Refuses Tylenol at present time.  Fredirick Maudlin NP made aware; order received and implemented.
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
## 2012-02-26 NOTE — Discharge Summary (Addendum)
Physician Discharge Summary  Jerry Edwards [id number] DOB: Oct 07, 1970 DOA: 02/23/2012  PCP: No primary provider on file.  Admit date: 02/23/2012 Discharge date: 02/27/2012  Time spent: 40 minutes  Recommendations for Outpatient Follow-up:  1. Follow up with [facility] cardiology.   Discharge Diagnoses:  Principal Problem:  *Acute on chronic systolic CHF (congestive heart failure) Active Problems:  Chest pain  Anemia  Osteogenesis imperfecta  HTN (hypertension), malignant  CAP (community acquired pneumonia)   Discharge Condition: Satisfactory.   Diet recommendation: Heart-Healthy.   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 He reports 3 days of orthopnea and PND as well as 2 days of nearly constant chest pressure. He recently had a cold and was taking alka seltzer cold with phenylephrine but stopped this a few days ago. He has previously noted elevated BP but not to the degree that it was elevated here. He received IV lasix in the ED with improvement of his symptoms. He also underwent CTA of the chest for PE. He was admitted for further management.   Hospital Course:  1. Malignant HTN: Patient was found to have a markedly elevated BP of 192/117 at presentation, associated with chest discomfort, over the prior 3 days, as well as shortness of breath. This was initially managed with ivi NTG, Norvasc and Coreg, with improvement overnight. As of 02/24/12, patient had no further chest pain and BP had improved, so ivi NTG was discontinued on that date, and patient managed with ACE-i/Coreg, adjusted as indicated. Patient had a single hypotensive episode with iv Hydralazine, so this has been discontinued. BP medications were titrated, with satisfactory  clinical response.  2. Systolic Congestive Heart Failure: As described above, patient has SOB on presentation. CXR/CTA confirmed cardiomegaly and CHF, likely driven by afterload, in the context of uncontrolled HTN. 12-Lead EKG was devoid of acute ischemic changes, cardiac enzymes were un-elevated. 2D Echocardiogram showed mildly dilated LV cavity size, mild LVH, EF of 25% to 30% and diffusehypokinesis. There was mild mitral regurgitation, mildly dilated left atrium, mildly dilated RV and right atrium. Dr Aletta Edouard provided initial cardiology consultation, and patient was subsequently seen by Dr Acie Fredrickson. Patient was initially managed with iv lasix, but with clinical improvement, he was successfully transitioned to oral Lasix on 02/25/12. Clinically, CHF now compensated.  3. CAP: This was an incidental finding on CTA, which showed Small focal airspace opacity in the upper lobes bilaterally, more prominent on the left. Patient remained afebrile, had no phlegm production or cough, and wcc, normal. He was managed with a 7-day course of Levaquin, to be concluded on 02/29/12.      Procedures:  See below.  2D Echocardiogram.  Consultations:  Dr Aletta Edouard, cardiologist.   Discharge Exam: Filed Vitals:   02/27/12 [id number] 02/27/12 [id number] 02/27/12 0727 02/27/12 1401  BP: 153/113 173/114 152/88 119/94  Pulse: 97   90  Temp: 97.9 F (36.6 C)   98.2 F (36.8 C)  TempSrc: Oral   Oral  Resp: 18   20  Height:      Weight:      SpO2: 97%   99%    General: Comfortable, alert, communicative, fully oriented, not short of breath at rest.  HEENT: No clinical pallor, no jaundice, no  conjunctival injection or discharge. Hydration status is fair.  NECK: Supple, JVP not seen, no carotid bruits, no palpable lymphadenopathy, no palpable goiter.  CHEST: Clinically clear to auscultation, no wheezes, no crackles.  HEART: Sounds 1 and 2 heard, normal, regular, no murmurs.  ABDOMEN: Full, soft, non-tender, no  palpable organomegaly, no palpable masses, normal bowel sounds.  GENITALIA: Not examined.  LOWER EXTREMITIES: No pitting edema, palpable peripheral pulses.  MUSCULOSKELETAL SYSTEM: Bilateral LE, shortened/bowed.  CENTRAL NERVOUS SYSTEM: No focal neurologic deficit on gross examination.   Discharge Instructions      Discharge Orders    Future Orders Please Complete By Expires   Diet - low sodium heart healthy      Increase activity slowly          Medication List     As of 02/27/2012  3:08 PM    STOP taking these medications         amLODipine 2.5 MG tablet   Commonly known as: NORVASC      ibuprofen 200 MG tablet   Commonly known as: ADVIL,MOTRIN      TAKE these medications         aspirin 325 MG EC tablet   Take 1 tablet (325 mg total) by mouth daily.      carvedilol 12.5 MG tablet   Commonly known as: COREG   Take 1 tablet (12.5 mg total) by mouth 2 (two) times daily with a meal.      furosemide 40 MG tablet   Commonly known as: LASIX   Take 1 tablet (40 mg total) by mouth daily.      levofloxacin 500 MG tablet   Commonly known as: LEVAQUIN   Take 1 tablet (500 mg total) by mouth daily.      lisinopril 20 MG tablet   Commonly known as: PRINIVIL,ZESTRIL   Take 1 tablet (20 mg total) by mouth daily.      potassium chloride SA 20 MEQ tablet   Commonly known as: K-DUR,KLOR-CON   Take 1 tablet (20 mEq total) by mouth daily.         Follow-up Information    Schedule an appointment as soon as possible for a visit with Darden Amber., MD.   Contact information:   [address]  [place] [address] [phone]           The results of significant diagnostics from this hospitalization (including imaging, microbiology, ancillary and laboratory) are listed below for reference.    Significant Diagnostic Studies: Dg Chest 2 View  02/23/2012  *RADIOLOGY REPORT*  Clinical Data: Chest pain and shortness of breath  CHEST - 2 VIEW   Comparison: May 26, 2011.  Findings: There is essentially stable enlargement of the cardiac silhouette.  Pulmonary vascularity is normal.  Lungs clear.  No adenopathy.  Bony structures appear stable without focal lesions seen.  IMPRESSION: Stable cardiac enlargement.  The appearance of the cardiac silhouette raises concern for potential pericardial effusion. Echocardiography could be helpful in this regard as clinically indicated.  No edema or consolidation.   Original Report Authenticated By: Lowella Grip, M.D.    Ct Angio Chest W/cm &/or Wo Cm  02/23/2012  *RADIOLOGY REPORT*  Clinical Data: Chest pain and shortness of breath. History of osteogenesis imperfecta.  CT ANGIOGRAPHY CHEST  Technique:  Multidetector CT imaging of the chest using the standard protocol during bolus administration of intravenous contrast. Multiplanar reconstructed images including MIPs were obtained and reviewed to evaluate the vascular  anatomy.  Contrast: 147mL OMNIPAQUE IOHEXOL 300 MG/ML  SOLN  Comparison: Plain films of the chest earlier this same day 04/24/2011.  Findings: No pulmonary embolus is identified.  The patient has marked cardiomegaly.  There is reflux of contrast material into the inferior vena cava compatible with right heart insufficiency.  Very small pericardial effusion is present.  Trace right pleural effusion is also noted.  A right hilar lymph node measures 1.7 x 2.6 cm on image 26.  No other enlarged lymph nodes are identified.  Lungs demonstrate scattered ground-glass attenuation.  Small foci of airspace disease are identified in the upper lobes bilaterally. There is diffuse body wall edema.  Incidentally imaged upper abdomen shows a small amount of perihepatic ascites.  Visualized intra-abdominal contents are otherwise unremarkable.  Bones demonstrate compression fracture deformities at all visualized levels of the spine consistent with the patient's history of osteogenesis imperfecta.  IMPRESSION:  1.   Negative for pulmonary embolus. 2.  Scattered ground-glass attenuation in patient with marked cardiomegaly most consistent with interstitial edema. 3.  Small foci airspace opacity in the upper lobes bilaterally, more prominent on the left, could be due to atelectasis or bronchopneumonia. 4.  Single 1.7 cm right hilar lymph node is nonspecific but likely reactive. 5.  Trace amount of perihepatic ascites of indeterminate etiology. 6.  Multiple thoracic spine compression fractures consistent with history of osteogenesis imperfecta.   Original Report Authenticated By: Orlean Patten, M.D.     Microbiology: Recent Results (from the past 240 hour(s))  MRSA PCR SCREENING     Status: Normal   Collection Time   02/23/12 11:20 PM      Component Value Range Status Comment   MRSA by PCR NEGATIVE  NEGATIVE Final      Labs: Basic Metabolic Panel:  Lab [id number] 02/26/12 0445 02/25/12 1900 02/25/12 0415 02/24/12 0530 02/24/12 0010 02/23/12 1620  NA 138 137 -- 140 141 -- 142  K 3.9 4.2 3.7 3.2* 3.3* -- --  CL 101 101 -- 102 102 -- 106  CO2 27 27 -- 29 30 -- 27  GLUCOSE 96 98 -- 97 88 -- 79  BUN 20 18 -- 16 22 -- 26*  CREATININE 0.97 0.97 -- 0.85 0.93 0.99 --  CALCIUM 8.9 9.3 -- 8.8 8.3* -- 8.7  MG 1.8 -- 1.8 -- -- 1.8 --  PHOS -- -- -- -- -- -- --   Liver Function Tests:  Lab 02/24/12 0530  AST 28  ALT 22  ALKPHOS 60  BILITOT 0.6  PROT 7.0  ALBUMIN 2.9*   No results found for this basename: LIPASE:5,AMYLASE:5 in the last 168 hours No results found for this basename: AMMONIA:5 in the last 168 hours CBC:  Lab 02/26/12 0445 02/25/12 0415 02/24/12 0010 02/23/12 1620  WBC 9.2 7.7 7.4 7.2  NEUTROABS -- -- 4.5 --  HGB 12.5* 11.1* 11.6* 11.0*  HCT 40.8 36.0* 37.3* 35.7*  MCV 91.3 89.8 90.3 89.9  PLT 371 341 362 370   Cardiac Enzymes:  Lab 02/24/12 1159 02/24/12 0530 02/24/12 0010 02/23/12 1620  CKTOTAL -- -- -- --  CKMB -- -- -- --  CKMBINDEX -- -- -- --  TROPONINI <0.30 <0.30  <0.30 <0.30   BNP: BNP (last 3 results)  Basename 02/26/12 0445 02/25/12 0415 02/24/12 0530  PROBNP 8246.0* 5069.0* 7123.0*   CBG: No results found for this basename: GLUCAP:5 in the last 168 hours     Signed:  Kerisha Goughnour,CHRISTOPHER  [facility] 02/27/2012, 3:08 PM

## 2012-02-26 NOTE — Progress Notes (Signed)
[facility] PROGRESS NOTE  Jerry Edwards [id number] DOB: 04-29-1970 DOA: 02/23/2012 PCP: No primary provider on file.  Assessment/Plan: Principal Problem:  *Acute on chronic systolic CHF (congestive heart failure) Active Problems:  Chest pain  Anemia  Osteogenesis imperfecta  HTN (hypertension), malignant  CAP (community acquired pneumonia)    1. Malignant HTN: Patient was found to have a markedly elevated BP of 192/117 at presentation, associated with chest discomfort, over the past 3 days, as well as shortness of breath. This was initially managed with ivi NTG, Norvasc and Coreg, with improvement overnight. As of 02/24/12, patient had no chest pain and BP had improved, so ivi NTG was discontinued on that date, and patient managed with ACE-i/Coreg, adjusted as indicated. Patient had a single hypotensive episode with iv Hydralazine, so this has been discontinued. As of 02/26/12, BP is reasonable, but sub-optimal.  2. Systolic Congestive Heart Failure: As described above, patient has SOB on presentation. CXR/CTA confirmed cardiomegaly and CHF, likely driven by afterload, in the context of uncontrolled HTN. 12-Lead EKG was devoid of acute ischemic changes, cardiac enzymes were un-elevated. 2D Echocardiogram showed mildly dilated LV cavity size, mild LVH, EF of 25% to 30% and diffusehypokinesis. There was mild mitral regurgitation, mildly dilated left atrium, mildly dilated RV and right atrium. Dr Aletta Edouard provided cardiology consultation, and patient was subsequently seen by Dr Acie Fredrickson. We are managing as recommended. Patient has been transitioned to oral Lasix on 02/25/12, and clinically, CHF appears compensated.  3. CAP: This was an incidental finding on CTA, which showed Small foci airspace opacity in the upper lobes bilaterally, more prominent on the left. Patient is afebrile, and wcc is normal. Managing with iv Levaquin, now day#3. Patient is afebrile, he has no phlegm production or  cough, and wcc is normal. Have switched to oral Levaquin for an additional 4 days, to be concluded on 02/29/12.     Code Status: Full Code.  Family Communication:  Disposition Plan: Per cardiology. Aiming discharge on 02/27/12.    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 He reports 3 days of orthopnea and PND as well as 2 days of nearly constant chest pressure. He recently had a cold and was taking alka seltzer cold with phenylephrine but stopped this a few days ago. He has previously noted elevated BP but not to the degree that it was elevated here. He received IV lasix in the ED with improvement of his symptoms. He also underwent CTA of the chest for PE. He was admitted for further management.   Consultants:  N/A  Procedures:  CXR  CTA.   Antibiotics:  Levaquin 02/23/12>>>  HPI/Subjective: Asymptomatic.  Objective: Vital signs in last 24 hours: Temp:  [97.5 F (36.4 C)-98.4 F (36.9 C)] 98.4 F (36.9 C) (12/06 0618) Pulse Rate:  [84-102] 84  (12/06 0634) Resp:  [22] 22  (12/05 2109) BP: (147-157)/(89-111) 157/96 mmHg (12/06 0634) SpO2:  [97 %-98 %] 97 % (12/06 0618) Weight:  [79.9 kg (176 lb 2.4 oz)] 79.9 kg (176 lb 2.4 oz) (12/06 0500) Weight change: 0.1 kg (3.5 oz) Last BM Date: 02/24/12  Intake/Output from previous day: 12/05 0701 - 12/06 0700 In: 120 [P.O.:120] Out: 100 [Urine:100]     Physical Exam: General: Comfortable, alert, communicative, fully oriented, not short of breath at rest.  HEENT:  No clinical pallor, no jaundice, no conjunctival injection or discharge. Hydration status is fair.  NECK:  Supple, JVP not seen,  no carotid bruits, no palpable lymphadenopathy, no palpable goiter. CHEST:  Clinically clear to auscultation, no wheezes, no crackles. HEART:  Sounds 1 and 2 heard, normal, regular, mildly tachycardic, no murmurs. ABDOMEN:  Full, soft, non-tender, no  palpable organomegaly, no palpable masses, normal bowel sounds. GENITALIA:  Not examined. LOWER EXTREMITIES:  No pitting edema, palpable peripheral pulses. MUSCULOSKELETAL SYSTEM:  Bilateral LE, shortened/bowed.  CENTRAL NERVOUS SYSTEM:  No focal neurologic deficit on gross examination.  Lab Results:  Basename 02/26/12 0445 02/25/12 0415  WBC 9.2 7.7  HGB 12.5* 11.1*  HCT 40.8 36.0*  PLT 371 341    Basename 02/26/12 0445 02/25/12 1900 02/25/12 0415  NA 137 -- 140  K 4.2 3.7 --  CL 101 -- 102  CO2 27 -- 29  GLUCOSE 98 -- 97  BUN 18 -- 16  CREATININE 0.97 -- 0.85  CALCIUM 9.3 -- 8.8   Recent Results (from the past 240 hour(s))  MRSA PCR SCREENING     Status: Normal   Collection Time   02/23/12 11:20 PM      Component Value Range Status Comment   MRSA by PCR NEGATIVE  NEGATIVE Final      Studies/Results: No results found.  Medications: Scheduled Meds:    . antiseptic oral rinse  15 mL Mouth Rinse BID  . aspirin EC  325 mg Oral Daily  . carvedilol  6.25 mg Oral BID WC  . enoxaparin (LOVENOX) injection  40 mg Subcutaneous QHS  . furosemide  40 mg Oral Daily  . [COMPLETED] levofloxacin (LEVAQUIN) IV  750 mg Intravenous QHS  . levofloxacin  500 mg Oral Daily  . lisinopril  20 mg Oral Daily  . potassium chloride  20 mEq Oral Daily  . sodium chloride  3 mL Intravenous Q12H  . sodium chloride  3 mL Intravenous Q12H  . [DISCONTINUED] carvedilol  3.125 mg Oral BID WC  . [DISCONTINUED] carvedilol  3.125 mg Oral BID WC  . [DISCONTINUED] carvedilol  6.25 mg Oral BID WC  . [DISCONTINUED] potassium chloride  40 mEq Oral Daily   Continuous Infusions:  PRN Meds:.acetaminophen, acetaminophen, hydrALAZINE, ondansetron (ZOFRAN) IV, ondansetron    LOS: 3 days   [facility] Pager [phone]. If 8PM-8AM, please contact night-coverage at [email or web address], password [facility] 02/26/2012, 2:09 PM  LOS: 3 days

## 2012-02-26 NOTE — Plan of Care (Signed)
Problem: Phase II Progression Outcomes Goal: Walk in hall or up in chair TID Outcome: Completed/Met Date Met:  02/26/12 Up in wheelchair TID

## 2012-02-27 LAB — BASIC METABOLIC PANEL
BUN: 20 mg/dL (ref 6–23)
GFR calc Af Amer: >90 mL/min (ref >90)
GFR calc non Af Amer: >90 mL/min (ref >90)
Potassium: 3.9 mEq/L (ref 3.5–5.1)

## 2012-02-27 MED ORDER — LEVOFLOXACIN 500 MG PO TABS: 500.0000 mg | ORAL_TABLET | Freq: Every day | ORAL | Status: AC

## 2012-02-27 MED ORDER — POTASSIUM CHLORIDE CRYS ER 20 MEQ PO TBCR: 20.0000 meq | EXTENDED_RELEASE_TABLET | Freq: Every day | ORAL | Status: DC

## 2012-02-27 MED ORDER — CARVEDILOL 12.5 MG PO TABS
12.5000 mg | ORAL_TABLET | Freq: Two times a day (BID) | ORAL | Status: DC
  Filled 2012-02-27 (×2): qty 1

## 2012-02-27 MED ORDER — LISINOPRIL 20 MG PO TABS: 20.0000 mg | ORAL_TABLET | Freq: Every day | ORAL | Status: DC

## 2012-02-27 MED ORDER — CARVEDILOL 12.5 MG PO TABS: 12.5000 mg | ORAL_TABLET | Freq: Two times a day (BID) | ORAL | Status: DC

## 2012-02-27 MED ORDER — FUROSEMIDE 40 MG PO TABS: 40.0000 mg | ORAL_TABLET | Freq: Every day | ORAL | Status: DC

## 2012-02-27 MED ORDER — ASPIRIN 325 MG PO TBEC: 325.0000 mg | DELAYED_RELEASE_TABLET | Freq: Every day | ORAL | Status: DC

## 2012-02-27 MED ORDER — LEVOFLOXACIN 500 MG PO TABS: 500.0000 mg | ORAL_TABLET | Freq: Every day | ORAL | Status: DC

## 2012-02-27 NOTE — Progress Notes (Signed)
Patient ID: Jerry Edwards, male   DOB: 1970-05-18, 41 y.o.   MRN: [id number]   SUBJECTIVE: Please see the complete cardiology note by Dr.[name] yesterday.    Filed Vitals:   02/26/12 2127 02/27/12 0500 02/27/12 0620 02/27/12 0638  BP: 139/94  153/113 173/114  Pulse: 93  97   Temp: 98.3 F (36.8 C)  97.9 F (36.6 C)   TempSrc: Oral  Oral   Resp: 20  18   Height:      Weight:  173 lb 11.6 oz (78.8 kg)    SpO2: 98%  97%     Intake/Output Summary (Last 24 hours) at 02/27/12 0724 Last data filed at 02/26/12 2300  Gross per 24 hour  Intake    480 ml  Output    700 ml  Net   -220 ml    LABS: Basic Metabolic Panel:  Basename 02/27/12 0525 02/26/12 0445 02/25/12 1900  NA 138 137 --  K 3.9 4.2 --  CL 101 101 --  CO2 27 27 --  GLUCOSE 96 98 --  BUN 20 18 --  CREATININE 0.97 0.97 --  CALCIUM 8.9 9.3 --  MG -- -- 1.8  PHOS -- -- --   Liver Function Tests: No results found for this basename: AST:2,ALT:2,ALKPHOS:2,BILITOT:2,PROT:2,ALBUMIN:2 in the last 72 hours No results found for this basename: LIPASE:2,AMYLASE:2 in the last 72 hours CBC:  Basename 02/26/12 0445 02/25/12 0415  WBC 9.2 7.7  NEUTROABS -- --  HGB 12.5* 11.1*  HCT 40.8 36.0*  MCV 91.3 89.8  PLT 371 341   Cardiac Enzymes:  Basename 02/24/12 1159  CKTOTAL --  CKMB --  CKMBINDEX --  TROPONINI <0.30   BNP: No components found with this basename: POCBNP:3 D-Dimer: No results found for this basename: DDIMER:2 in the last 72 hours Hemoglobin A1C: No results found for this basename: HGBA1C in the last 72 hours Fasting Lipid Panel: No results found for this basename: CHOL,HDL,LDLCALC,TRIG,CHOLHDL,LDLDIRECT in the last 72 hours Thyroid Function Tests: No results found for this basename: TSH,T4TOTAL,FREET3,T3FREE,THYROIDAB in the last 72 hours  RADIOLOGY: Dg Chest 2 View  02/23/2012  *RADIOLOGY REPORT*  Clinical Data: Chest pain and shortness of breath  CHEST - 2 VIEW  Comparison: May 26, 2011.   Findings: There is essentially stable enlargement of the cardiac silhouette.  Pulmonary vascularity is normal.  Lungs clear.  No adenopathy.  Bony structures appear stable without focal lesions seen.  IMPRESSION: Stable cardiac enlargement.  The appearance of the cardiac silhouette raises concern for potential pericardial effusion. Echocardiography could be helpful in this regard as clinically indicated.  No edema or consolidation.   Original Report Authenticated By: Lowella Grip, M.D.    Ct Angio Chest W/cm &/or Wo Cm  02/23/2012  *RADIOLOGY REPORT*  Clinical Data: Chest pain and shortness of breath. History of osteogenesis imperfecta.  CT ANGIOGRAPHY CHEST  Technique:  Multidetector CT imaging of the chest using the standard protocol during bolus administration of intravenous contrast. Multiplanar reconstructed images including MIPs were obtained and reviewed to evaluate the vascular anatomy.  Contrast: 17mL OMNIPAQUE IOHEXOL 300 MG/ML  SOLN  Comparison: Plain films of the chest earlier this same day 04/24/2011.  Findings: No pulmonary embolus is identified.  The patient has marked cardiomegaly.  There is reflux of contrast material into the inferior vena cava compatible with right heart insufficiency.  Very small pericardial effusion is present.  Trace right pleural effusion is also noted.  A right hilar lymph node measures 1.7 x 2.6  cm on image 26.  No other enlarged lymph nodes are identified.  Lungs demonstrate scattered ground-glass attenuation.  Small foci of airspace disease are identified in the upper lobes bilaterally. There is diffuse body wall edema.  Incidentally imaged upper abdomen shows a small amount of perihepatic ascites.  Visualized intra-abdominal contents are otherwise unremarkable.  Bones demonstrate compression fracture deformities at all visualized levels of the spine consistent with the patient's history of osteogenesis imperfecta.  IMPRESSION:  1.  Negative for pulmonary embolus.  2.  Scattered ground-glass attenuation in patient with marked cardiomegaly most consistent with interstitial edema. 3.  Small foci airspace opacity in the upper lobes bilaterally, more prominent on the left, could be due to atelectasis or bronchopneumonia. 4.  Single 1.7 cm right hilar lymph node is nonspecific but likely reactive. 5.  Trace amount of perihepatic ascites of indeterminate etiology. 6.  Multiple thoracic spine compression fractures consistent with history of osteogenesis imperfecta.   Original Report Authenticated By: Orlean Patten, M.D.     PHYSICAL EXAM    TELEMETRY: I have reviewed telemetry today February 27, 2012. There is normal sinus rhythm   ASSESSMENT AND PLAN:   HTN (hypertension), malignant    Suggest increasing carvedilol today,    Dola Argyle 02/27/2012 7:24 AM

## 2012-02-27 NOTE — Progress Notes (Signed)
Pt had 3bts vtach, then returns to NSR.  BP 173/114.  Alert, oriented.  Denies chest pain.  Medicated with Hydralazine prn as ordered.   At 0730, BP now 152/88.  Dr Rockne Menghini made aware of events; new orders received.

## 2012-02-27 NOTE — Progress Notes (Signed)
Having pauses longest 3.22 hr with one dropped to 39,Dr. Blenda Nicely and Dr. Ron Parker notified, No further orders.

## 2012-03-03 ENCOUNTER — Ambulatory Visit (INDEPENDENT_AMBULATORY_CARE_PROVIDER_SITE_OTHER): Payer: Medicare Other | Admitting: Physician Assistant

## 2012-03-03 ENCOUNTER — Ambulatory Visit: Payer: Medicare Other | Admitting: Physician Assistant

## 2012-03-03 ENCOUNTER — Encounter: Payer: Self-pay | Admitting: Physician Assistant

## 2012-03-03 VITALS — BP 133/83 | HR 80 | Ht 60.0 in | Wt 161.0 lb

## 2012-03-03 DIAGNOSIS — I5023 Acute on chronic systolic (congestive) heart failure: Secondary | ICD-10-CM

## 2012-03-03 DIAGNOSIS — I10 Essential (primary) hypertension: Secondary | ICD-10-CM

## 2012-03-03 DIAGNOSIS — I509 Heart failure, unspecified: Secondary | ICD-10-CM | POA: Diagnosis not present

## 2012-03-03 LAB — BASIC METABOLIC PANEL
BUN: 17 mg/dL (ref 6–23)
Creatinine, Ser: 1 mg/dL (ref 0.4–1.5)
GFR: 107.93 mL/min (ref >60.00)
Potassium: 3.7 mEq/L (ref 3.5–5.1)

## 2012-03-03 MED ORDER — CARVEDILOL 25 MG PO TABS: 25.0000 mg | ORAL_TABLET | Freq: Two times a day (BID) | ORAL | Status: DC

## 2012-03-03 NOTE — Patient Instructions (Addendum)
Your physician recommends that you schedule a follow-up appointment in: 1 month with Dr Acie Fredrickson  Your physician has recommended you make the following change in your medication: INCREASE Carvedilol to 25 mg twice daily    2 Gram Low Sodium Diet A 2 gram sodium diet restricts the amount of sodium in the diet to no more than 2 g or 2000 mg daily. Limiting the amount of sodium is often used to help lower blood pressure. It is important if you have heart, liver, or kidney problems. Many foods contain sodium for flavor and sometimes as a preservative. When the amount of sodium in a diet needs to be low, it is important to know what to look for when choosing foods and drinks. The following includes some information and guidelines to help make it easier for you to adapt to a low sodium diet. QUICK TIPS  Do not add salt to food.  Avoid convenience items and fast food.  Choose unsalted snack foods.  Buy lower sodium products, often labeled as "lower sodium" or "no salt added."  Check food labels to learn how much sodium is in 1 serving.  When eating at a restaurant, ask that your food be prepared with less salt or none, if possible. READING FOOD LABELS FOR SODIUM INFORMATION The nutrition facts label is a good place to find how much sodium is in foods. Look for products with no more than 500 to 600 mg of sodium per meal and no more than 150 mg per serving. Remember that 2 g = 2000 mg. The food label may also list foods as:  Sodium-free: Less than 5 mg in a serving.  Very low sodium: 35 mg or less in a serving.  Low-sodium: 140 mg or less in a serving.  Light in sodium: 50% less sodium in a serving. For example, if a food that usually has 300 mg of sodium is changed to become light in sodium, it will have 150 mg of sodium.  Reduced sodium: 25% less sodium in a serving. For example, if a food that usually has 400 mg of sodium is changed to reduced sodium, it will have 300 mg of  sodium. CHOOSING FOODS Grains  Avoid: Salted crackers and snack items. Some cereals, including instant hot cereals. Bread stuffing and biscuit mixes. Seasoned rice or pasta mixes.  Choose: Unsalted snack items. Low-sodium cereals, oats, puffed wheat and rice, shredded wheat. English muffins and bread. Pasta. Meats  Avoid: Salted, canned, smoked, spiced, pickled meats, including fish and poultry. Bacon, ham, sausage, cold cuts, hot dogs, anchovies.  Choose: Low-sodium canned tuna and salmon. Fresh or frozen meat, poultry, and fish. Dairy  Avoid: Processed cheese and spreads. Cottage cheese. Buttermilk and condensed milk. Regular cheese.  Choose: Milk. Low-sodium cottage cheese. Yogurt. Sour cream. Low-sodium cheese. Fruits and Vegetables  Avoid: Regular canned vegetables. Regular canned tomato sauce and paste. Frozen vegetables in sauces. Olives. Angie Fava. Relishes. Sauerkraut.  Choose: Low-sodium canned vegetables. Low-sodium tomato sauce and paste. Frozen or fresh vegetables. Fresh and frozen fruit. Condiments  Avoid: Canned and packaged gravies. Worcestershire sauce. Tartar sauce. Barbecue sauce. Soy sauce. Steak sauce. Ketchup. Onion, garlic, and table salt. Meat flavorings and tenderizers.  Choose: Fresh and dried herbs and spices. Low-sodium varieties of mustard and ketchup. Lemon juice. Tabasco sauce. Horseradish. SAMPLE 2 GRAM SODIUM MEAL PLAN Breakfast / Sodium (mg)  1 cup low-fat milk / [id number] mg  2 slices whole-wheat toast / 270 mg  1 tbs heart-healthy margarine / 153 mg  1 hard-boiled egg / 139 mg  1 small orange / 0 mg Lunch / Sodium (mg)  1 cup raw carrots / 76 mg   cup hummus / 298 mg  1 cup low-fat milk / 143 mg   cup red grapes / 2 mg  1 whole-wheat pita bread / 356 mg Dinner / Sodium (mg)  1 cup whole-wheat pasta / 2 mg  1 cup low-sodium tomato sauce / 73 mg  3 oz lean ground beef / 57 mg  1 small side salad (1 cup raw spinach leaves,  cup  cucumber,  cup yellow bell pepper) with 1 tsp olive oil and 1 tsp red wine vinegar / 25 mg Snack / Sodium (mg)  1 container low-fat vanilla yogurt / 107 mg  3 graham cracker squares / 127 mg Nutrient Analysis  Calories: 2033  Protein: 77 g  Carbohydrate: 282 g  Fat: 72 g  Sodium: 1971 mg Document Released: 03/09/2005 Document Revised: 06/01/2011 Document Reviewed: 06/10/2009 [facility] Patient Information 2013 [place], [place].
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
## 2012-03-03 NOTE — Assessment & Plan Note (Addendum)
Patient had recent admission with acute on chronic heart failure. His ejection fraction is 25-30% with diffuse hypokinesis. He is well compensated today. Continue current medications. Will increase Coreg to 25 mg b.i.d.Check BMET.

## 2012-03-03 NOTE — Assessment & Plan Note (Signed)
Blood pressure is stable. Will increase Coreg to 25 mg b.i.d.

## 2012-03-14 ENCOUNTER — Emergency Department (HOSPITAL_COMMUNITY)
Admission: EM | Admit: 2012-03-14 | Discharge: 2012-03-15 | Disposition: A | Discharge status: Home / Self Care | Payer: Medicare Other | Attending: Emergency Medicine | Admitting: Emergency Medicine

## 2012-03-14 ENCOUNTER — Encounter (HOSPITAL_COMMUNITY): Payer: Self-pay | Admitting: *Deleted

## 2012-03-14 DIAGNOSIS — Z79899 Other long term (current) drug therapy: Secondary | ICD-10-CM | POA: Insufficient documentation

## 2012-03-14 DIAGNOSIS — Z7982 Long term (current) use of aspirin: Secondary | ICD-10-CM | POA: Diagnosis not present

## 2012-03-14 DIAGNOSIS — Q780 Osteogenesis imperfecta: Secondary | ICD-10-CM | POA: Insufficient documentation

## 2012-03-14 DIAGNOSIS — R141 Gas pain: Secondary | ICD-10-CM | POA: Diagnosis not present

## 2012-03-14 DIAGNOSIS — Z8781 Personal history of (healed) traumatic fracture: Secondary | ICD-10-CM | POA: Diagnosis not present

## 2012-03-14 DIAGNOSIS — I509 Heart failure, unspecified: Secondary | ICD-10-CM | POA: Insufficient documentation

## 2012-03-14 DIAGNOSIS — J811 Chronic pulmonary edema: Secondary | ICD-10-CM | POA: Diagnosis not present

## 2012-03-14 DIAGNOSIS — Z87891 Personal history of nicotine dependence: Secondary | ICD-10-CM | POA: Insufficient documentation

## 2012-03-14 DIAGNOSIS — I10 Essential (primary) hypertension: Secondary | ICD-10-CM | POA: Insufficient documentation

## 2012-03-14 DIAGNOSIS — R142 Eructation: Secondary | ICD-10-CM | POA: Insufficient documentation

## 2012-03-14 DIAGNOSIS — M7989 Other specified soft tissue disorders: Secondary | ICD-10-CM | POA: Insufficient documentation

## 2012-03-14 NOTE — ED Notes (Signed)
Per pt report: Pt has hx of CHF and was release from the hospital about 3 weeks ago.  Pt currently c/o SOB that began this evening.  Pt reporting tightness in his chest and has become worse after pt took a shower.  Pt reports it feels like he is retaining fluids.  Pt reports legs are swollen and abd feeling "tight."

## 2012-03-15 ENCOUNTER — Emergency Department (HOSPITAL_COMMUNITY): Payer: Medicare Other

## 2012-03-15 DIAGNOSIS — J811 Chronic pulmonary edema: Secondary | ICD-10-CM | POA: Diagnosis not present

## 2012-03-15 LAB — COMPREHENSIVE METABOLIC PANEL
ALT: 37 U/L (ref 0–53)
AST: 42 U/L — ABNORMAL HIGH (ref 0–37)
Albumin: 2.8 g/dL — ABNORMAL LOW (ref 3.5–5.2)
Alkaline Phosphatase: 69 U/L (ref 39–117)
BUN: 22 mg/dL (ref 6–23)
Chloride: 107 mEq/L (ref 96–112)
Potassium: 3.8 mEq/L (ref 3.5–5.1)
Sodium: 140 mEq/L (ref 135–145)
Total Bilirubin: 0.4 mg/dL (ref 0.3–1.2)
Total Protein: 7.1 g/dL (ref 6.0–8.3)

## 2012-03-15 LAB — CBC WITH DIFFERENTIAL/PLATELET
Basophils Absolute: 0 10*3/uL (ref 0.0–0.1)
Basophils Relative: 0 % (ref 0–1)
Eosinophils Absolute: 0 10*3/uL (ref 0.0–0.7)
Hemoglobin: 11.8 g/dL — ABNORMAL LOW (ref 13.0–17.0)
MCH: 27.7 pg (ref 26.0–34.0)
MCHC: 30.6 g/dL (ref 30.0–36.0)
Monocytes Relative: 14 % — ABNORMAL HIGH (ref 3–12)
Neutro Abs: 2.2 10*3/uL (ref 1.7–7.7)
Neutrophils Relative %: 48 % (ref 43–77)
Platelets: 233 10*3/uL (ref 150–400)

## 2012-03-15 MED ORDER — FUROSEMIDE 10 MG/ML IJ SOLN
80.0000 mg | Freq: Once | INTRAMUSCULAR | Status: AC
  Administered 2012-03-15: 80 mg via INTRAVENOUS
  Filled 2012-03-15: qty 8
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
## 2012-03-15 NOTE — ED Notes (Signed)
Pt reports feeling less pressure in his abd and feeling well enough to go home.  MD made aware.

## 2012-03-24 ENCOUNTER — Telehealth: Payer: Self-pay | Admitting: Cardiovascular Disease

## 2012-03-24 MED ORDER — POTASSIUM CHLORIDE CRYS ER 20 MEQ PO TBCR: 20.0000 meq | EXTENDED_RELEASE_TABLET | Freq: Every day | ORAL | Status: DC

## 2012-03-24 MED ORDER — FUROSEMIDE 40 MG PO TABS: 40.0000 mg | ORAL_TABLET | Freq: Every day | ORAL | Status: DC

## 2012-03-24 MED ORDER — LISINOPRIL 20 MG PO TABS: 20.0000 mg | ORAL_TABLET | Freq: Every day | ORAL | Status: DC

## 2012-03-24 NOTE — Telephone Encounter (Signed)
Fax Received. Refill Completed. [place] Beckford Chowoe (R.M.A)  No Refills Until Appointment

## 2012-03-24 NOTE — Telephone Encounter (Signed)
Pt calling back to say he is out of potassium, almost out of lasix and lisinopril, needs refill asap

## 2012-03-24 NOTE — Telephone Encounter (Signed)
New problem:   [facility] on [address]    rx on all medication

## 2012-03-30 DIAGNOSIS — G245 Blepharospasm: Secondary | ICD-10-CM | POA: Diagnosis not present

## 2012-04-08 ENCOUNTER — Encounter: Payer: Self-pay | Admitting: Cardiovascular Disease

## 2012-04-08 ENCOUNTER — Ambulatory Visit (INDEPENDENT_AMBULATORY_CARE_PROVIDER_SITE_OTHER): Payer: Medicare Other | Admitting: Cardiovascular Disease

## 2012-04-08 VITALS — BP 156/108 | HR 81 | Ht <= 58 in | Wt 147.0 lb

## 2012-04-08 DIAGNOSIS — I5023 Acute on chronic systolic (congestive) heart failure: Secondary | ICD-10-CM

## 2012-04-08 DIAGNOSIS — I509 Heart failure, unspecified: Secondary | ICD-10-CM

## 2012-04-08 DIAGNOSIS — I10 Essential (primary) hypertension: Secondary | ICD-10-CM

## 2012-04-08 MED ORDER — HYDRALAZINE HCL 25 MG PO TABS: ORAL_TABLET | ORAL | Status: DC

## 2012-04-08 NOTE — Assessment & Plan Note (Signed)
Peytin presents today for followup. His blood pressure still elevated. He is feeling better than when he presented to the hospital last month.  He had a good response to  IV hydralazine in the hospital. We'll start him on thousand 12.5 mg 3 times a day per week. He will then increase to 25 mg 3 times a day. I'll see him again in 3 months. We'll check a basic metabolic profile at that time.

## 2012-04-08 NOTE — Patient Instructions (Addendum)
Your physician has recommended you make the following change in your medication:   START HYDRALAZINE 25 MG TABLETS.  FIRST WEEK TAKE 1/2 TABLET THREE TIMES DAILY WHICH IS EVERY 8 HRS. THEN  TAKE WHOLE TABLET THREE TIMES DAILY WHICH IS EVERY 8 HRS.   Your physician recommends that you schedule a follow-up appointment in: 3 MONTHS    REDUCE [place], GRAVY, SAUCES, READY PREPARED FOODS [place]; LEAN CUISINE, LASAGNA. BACON, SAUSAGE, LUNCH MEAT, FAST FOODS.Marland Kitchen   CALL IF YOUR HAVING ANY DIZZINESS FOR FURTHER INTRUCTIONS
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
## 2012-04-08 NOTE — Assessment & Plan Note (Addendum)
Jerry Edwards seems to be doing well. His symptoms have improved. We will add hydralazine.I will see him  in 3 months.  We reviewed the fact that he should be low salt diet. We've given him information on the DASH diet.   I suspect that his congestive heart failure is due to his prolonged hypertension. He's never had any episodes of angina. At some point we will likely want to do a Lexiscan Myoview to rule out coronary artery disease.

## 2012-04-14 ENCOUNTER — Telehealth: Payer: Self-pay | Admitting: Cardiovascular Disease

## 2012-04-14 MED ORDER — FUROSEMIDE 40 MG PO TABS: 40.0000 mg | ORAL_TABLET | Freq: Every day | ORAL | Status: DC

## 2012-04-14 NOTE — Telephone Encounter (Signed)
Pt dropped med in the sink, needs refill of lasix asap to [place]

## 2012-04-25 ENCOUNTER — Telehealth: Payer: Self-pay | Admitting: Cardiovascular Disease

## 2012-04-25 MED ORDER — POTASSIUM CHLORIDE CRYS ER 20 MEQ PO TBCR: 20.0000 meq | EXTENDED_RELEASE_TABLET | Freq: Every day | ORAL | Status: DC

## 2012-04-25 MED ORDER — LISINOPRIL 20 MG PO TABS: 20.0000 mg | ORAL_TABLET | Freq: Every day | ORAL | Status: DC

## 2012-04-25 NOTE — Telephone Encounter (Signed)
New Problem:    PAtient called in needing a refill of his lisinopril (PRINIVIL,ZESTRIL) 20 MG tablet and potassium chloride SA (K-DUR,KLOR-CON) 20 MEQ tablet.

## 2012-05-04 ENCOUNTER — Other Ambulatory Visit: Payer: Self-pay | Admitting: Cardiovascular Disease

## 2012-05-04 DIAGNOSIS — R0602 Shortness of breath: Secondary | ICD-10-CM

## 2012-05-04 DIAGNOSIS — D649 Anemia, unspecified: Secondary | ICD-10-CM

## 2012-05-04 DIAGNOSIS — I509 Heart failure, unspecified: Secondary | ICD-10-CM

## 2012-05-04 MED ORDER — FUROSEMIDE 40 MG PO TABS: 40.0000 mg | ORAL_TABLET | Freq: Every day | ORAL | Status: DC

## 2012-05-04 NOTE — Telephone Encounter (Signed)
New Problem    Pt called in c/o being very tired. Requesting some nutritional suggestions to boost his energy. Could like to speak to nurse. (Has not contacted PCP)

## 2012-05-04 NOTE — Telephone Encounter (Signed)
No further recs. Agree with him calling his primary md.

## 2012-05-04 NOTE — Telephone Encounter (Signed)
Pt c/o occasional SOB/ increased fatigue, he is not getting daily wts/ taught him 3-5 lb rule/ daily wts with digital scale and encouraged to call with weight gain so meds can be adjusted. Salt intake was reviewed and seems to be avoiding salt intake well. Pt has not had labs since hospital dc12/24/13. Ordered labs bmet/bnp/cbc for this week, pt has been anemic and last bnp was 10243. on 03/15/12. Pt will call back with his bp readings but states they have been better since adding hydralazine. Advised calling pcp but we can check labs in the mean time, please review and can you advise anything further?

## 2012-05-05 ENCOUNTER — Other Ambulatory Visit: Payer: Medicare Other

## 2012-05-06 ENCOUNTER — Emergency Department (HOSPITAL_COMMUNITY): Payer: Medicare Other

## 2012-05-06 ENCOUNTER — Inpatient Hospital Stay (HOSPITAL_COMMUNITY)
Admission: EM | Admit: 2012-05-06 | Discharge: 2012-05-08 | DRG: 194 | Disposition: A | Discharge status: Home / Self Care | Payer: Medicare Other | Attending: Internal Medicine | Admitting: Internal Medicine

## 2012-05-06 ENCOUNTER — Encounter (HOSPITAL_COMMUNITY): Payer: Self-pay | Admitting: Emergency Medicine

## 2012-05-06 DIAGNOSIS — R059 Cough, unspecified: Secondary | ICD-10-CM | POA: Diagnosis not present

## 2012-05-06 DIAGNOSIS — R0902 Hypoxemia: Secondary | ICD-10-CM | POA: Diagnosis not present

## 2012-05-06 DIAGNOSIS — Q780 Osteogenesis imperfecta: Secondary | ICD-10-CM

## 2012-05-06 DIAGNOSIS — Z79899 Other long term (current) drug therapy: Secondary | ICD-10-CM

## 2012-05-06 DIAGNOSIS — D649 Anemia, unspecified: Secondary | ICD-10-CM | POA: Diagnosis present

## 2012-05-06 DIAGNOSIS — I5023 Acute on chronic systolic (congestive) heart failure: Secondary | ICD-10-CM

## 2012-05-06 DIAGNOSIS — Z87891 Personal history of nicotine dependence: Secondary | ICD-10-CM | POA: Diagnosis not present

## 2012-05-06 DIAGNOSIS — I5022 Chronic systolic (congestive) heart failure: Secondary | ICD-10-CM | POA: Diagnosis present

## 2012-05-06 DIAGNOSIS — J189 Pneumonia, unspecified organism: Principal | ICD-10-CM

## 2012-05-06 DIAGNOSIS — Z7982 Long term (current) use of aspirin: Secondary | ICD-10-CM | POA: Diagnosis not present

## 2012-05-06 DIAGNOSIS — R0602 Shortness of breath: Secondary | ICD-10-CM | POA: Diagnosis not present

## 2012-05-06 DIAGNOSIS — I509 Heart failure, unspecified: Secondary | ICD-10-CM | POA: Diagnosis not present

## 2012-05-06 DIAGNOSIS — I10 Essential (primary) hypertension: Secondary | ICD-10-CM | POA: Diagnosis not present

## 2012-05-06 DIAGNOSIS — R05 Cough: Secondary | ICD-10-CM | POA: Diagnosis not present

## 2012-05-06 LAB — CBC WITH DIFFERENTIAL/PLATELET
Basophils Relative: 1 % (ref 0–1)
Eosinophils Absolute: 0 10*3/uL (ref 0.0–0.7)
HCT: 38.3 % — ABNORMAL LOW (ref 39.0–52.0)
Hemoglobin: 11.7 g/dL — ABNORMAL LOW (ref 13.0–17.0)
Lymphs Abs: 0.7 10*3/uL (ref 0.7–4.0)
MCH: 26.8 pg (ref 26.0–34.0)
MCHC: 30.5 g/dL (ref 30.0–36.0)
MCV: 87.8 fL (ref 78.0–100.0)
Monocytes Absolute: 1.1 10*3/uL — ABNORMAL HIGH (ref 0.1–1.0)
Monocytes Relative: 24 % — ABNORMAL HIGH (ref 3–12)
Neutrophils Relative %: 59 % (ref 43–77)
RBC: 4.36 MIL/uL (ref 4.22–5.81)

## 2012-05-06 LAB — COMPREHENSIVE METABOLIC PANEL
Albumin: 2.9 g/dL — ABNORMAL LOW (ref 3.5–5.2)
Alkaline Phosphatase: 77 U/L (ref 39–117)
BUN: 13 mg/dL (ref 6–23)
Creatinine, Ser: 0.75 mg/dL (ref 0.50–1.35)
GFR calc Af Amer: >90 mL/min (ref >90)
Glucose, Bld: 89 mg/dL (ref 70–99)
Potassium: 4.1 mEq/L (ref 3.5–5.1)
Total Bilirubin: 1.7 mg/dL — ABNORMAL HIGH (ref 0.3–1.2)
Total Protein: 7.3 g/dL (ref 6.0–8.3)

## 2012-05-06 LAB — TROPONIN I: Troponin I: <0.3 ng/mL (ref <0.30)

## 2012-05-06 LAB — EXPECTORATED SPUTUM ASSESSMENT W GRAM STAIN, RFLX TO RESP C

## 2012-05-06 LAB — PRO B NATRIURETIC PEPTIDE: Pro B Natriuretic peptide (BNP): 9525 pg/mL — ABNORMAL HIGH (ref 0–125)

## 2012-05-06 MED ORDER — DEXTROSE 5 % IV SOLN
1.0000 g | INTRAVENOUS | Status: DC
  Administered 2012-05-06: 1 g via INTRAVENOUS
  Filled 2012-05-06: qty 10

## 2012-05-06 MED ORDER — HYDRALAZINE HCL 50 MG PO TABS
50.0000 mg | ORAL_TABLET | Freq: Three times a day (TID) | ORAL | Status: DC
  Administered 2012-05-06 – 2012-05-07 (×3): 50 mg via ORAL
  Filled 2012-05-06 (×6): qty 1

## 2012-05-06 MED ORDER — SODIUM CHLORIDE 0.9 % IJ SOLN
3.0000 mL | Freq: Two times a day (BID) | INTRAMUSCULAR | Status: DC
  Administered 2012-05-08: 3 mL via INTRAVENOUS

## 2012-05-06 MED ORDER — LABETALOL HCL 5 MG/ML IV SOLN
10.0000 mg | Freq: Once | INTRAVENOUS | Status: AC
  Administered 2012-05-06: 10 mg via INTRAVENOUS
  Filled 2012-05-06: qty 4

## 2012-05-06 MED ORDER — SODIUM CHLORIDE 0.9 % IJ SOLN: 3.0000 mL | Freq: Two times a day (BID) | INTRAMUSCULAR | Status: DC

## 2012-05-06 MED ORDER — LISINOPRIL 20 MG PO TABS
20.0000 mg | ORAL_TABLET | Freq: Every day | ORAL | Status: DC
  Administered 2012-05-06: 20 mg via ORAL
  Filled 2012-05-06 (×2): qty 1

## 2012-05-06 MED ORDER — VANCOMYCIN HCL IN DEXTROSE 1-5 GM/200ML-% IV SOLN
1000.0000 mg | Freq: Three times a day (TID) | INTRAVENOUS | Status: DC
  Administered 2012-05-06 – 2012-05-08 (×6): 1000 mg via INTRAVENOUS
  Filled 2012-05-06 (×10): qty 200

## 2012-05-06 MED ORDER — IOHEXOL 350 MG/ML SOLN
80.0000 mL | Freq: Once | INTRAVENOUS | Status: AC | PRN
  Administered 2012-05-06: 80 mL via INTRAVENOUS

## 2012-05-06 MED ORDER — DEXTROSE 5 % IV SOLN
1.0000 g | Freq: Three times a day (TID) | INTRAVENOUS | Status: DC
  Administered 2012-05-06 – 2012-05-08 (×5): 1 g via INTRAVENOUS
  Filled 2012-05-06 (×8): qty 1

## 2012-05-06 MED ORDER — FUROSEMIDE 40 MG PO TABS
40.0000 mg | ORAL_TABLET | Freq: Two times a day (BID) | ORAL | Status: DC
  Administered 2012-05-06 – 2012-05-08 (×4): 40 mg via ORAL
  Filled 2012-05-06 (×6): qty 1

## 2012-05-06 MED ORDER — CARVEDILOL 25 MG PO TABS
25.0000 mg | ORAL_TABLET | Freq: Two times a day (BID) | ORAL | Status: DC
  Administered 2012-05-06 – 2012-05-08 (×4): 25 mg via ORAL
  Filled 2012-05-06 (×6): qty 1

## 2012-05-06 MED ORDER — LEVOFLOXACIN IN D5W 750 MG/150ML IV SOLN
750.0000 mg | INTRAVENOUS | Status: DC
  Administered 2012-05-06 – 2012-05-07 (×2): 750 mg via INTRAVENOUS
  Filled 2012-05-06 (×3): qty 150

## 2012-05-06 MED ORDER — SODIUM CHLORIDE 0.9 % IJ SOLN: 3.0000 mL | INTRAMUSCULAR | Status: DC | PRN

## 2012-05-06 MED ORDER — ALBUTEROL SULFATE (5 MG/ML) 0.5% IN NEBU
2.5000 mg | INHALATION_SOLUTION | Freq: Four times a day (QID) | RESPIRATORY_TRACT | Status: DC
  Administered 2012-05-07: 2.5 mg via RESPIRATORY_TRACT
  Filled 2012-05-06 (×3): qty 0.5

## 2012-05-06 MED ORDER — FUROSEMIDE 10 MG/ML IJ SOLN
40.0000 mg | Freq: Once | INTRAMUSCULAR | Status: AC
  Administered 2012-05-06: 40 mg via INTRAVENOUS
  Filled 2012-05-06: qty 4

## 2012-05-06 MED ORDER — ALBUTEROL SULFATE (5 MG/ML) 0.5% IN NEBU: 2.5000 mg | INHALATION_SOLUTION | RESPIRATORY_TRACT | Status: DC | PRN

## 2012-05-06 MED ORDER — SODIUM CHLORIDE 0.9 % IV SOLN: 250.0000 mL | INTRAVENOUS | Status: DC | PRN

## 2012-05-06 MED ORDER — POTASSIUM CHLORIDE CRYS ER 20 MEQ PO TBCR
20.0000 meq | EXTENDED_RELEASE_TABLET | Freq: Every day | ORAL | Status: DC
  Administered 2012-05-06 – 2012-05-08 (×3): 20 meq via ORAL
  Filled 2012-05-06 (×3): qty 1

## 2012-05-06 MED ORDER — ENOXAPARIN SODIUM 40 MG/0.4ML ~~LOC~~ SOLN
40.0000 mg | SUBCUTANEOUS | Status: DC
  Administered 2012-05-06 – 2012-05-07 (×2): 40 mg via SUBCUTANEOUS
  Filled 2012-05-06 (×3): qty 0.4

## 2012-05-06 MED ORDER — GUAIFENESIN-DM 100-10 MG/5ML PO SYRP
5.0000 mL | ORAL_SOLUTION | ORAL | Status: DC | PRN
  Administered 2012-05-07: 5 mL via ORAL
  Filled 2012-05-06: qty 10

## 2012-05-06 MED ORDER — ASPIRIN EC 325 MG PO TBEC
325.0000 mg | DELAYED_RELEASE_TABLET | Freq: Every day | ORAL | Status: DC
  Administered 2012-05-06 – 2012-05-08 (×3): 325 mg via ORAL
  Filled 2012-05-06 (×4): qty 1
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
## 2012-05-06 NOTE — ED Notes (Signed)
Transporter called to transport pt to floor.
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
## 2012-05-06 NOTE — ED Notes (Signed)
Per patient, hypertension, heart "flutter"-history of CHF, enlarged heart-cough past 4 days-unable to sleep

## 2012-05-06 NOTE — ED Notes (Signed)
Pt states been having shortness of breath, difficulty sleeping d/t fluid and shortness of breath, also states been coughing. Pt states been feeling heart "flutters" also.
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
## 2012-05-06 NOTE — ED Notes (Signed)
Pt returned from CT °

## 2012-05-06 NOTE — Progress Notes (Signed)
Pt confirms he is seen at [facility] providers-- dr Tollie Pizza, wilson, stallings and kaplan

## 2012-05-07 DIAGNOSIS — I10 Essential (primary) hypertension: Secondary | ICD-10-CM | POA: Diagnosis not present

## 2012-05-07 DIAGNOSIS — J189 Pneumonia, unspecified organism: Secondary | ICD-10-CM | POA: Diagnosis not present

## 2012-05-07 DIAGNOSIS — I5023 Acute on chronic systolic (congestive) heart failure: Secondary | ICD-10-CM | POA: Diagnosis not present

## 2012-05-07 DIAGNOSIS — I509 Heart failure, unspecified: Secondary | ICD-10-CM | POA: Diagnosis not present

## 2012-05-07 LAB — BASIC METABOLIC PANEL
BUN: 11 mg/dL (ref 6–23)
Calcium: 8.3 mg/dL — ABNORMAL LOW (ref 8.4–10.5)
GFR calc non Af Amer: >90 mL/min (ref >90)
Glucose, Bld: 110 mg/dL — ABNORMAL HIGH (ref 70–99)
Sodium: 136 mEq/L (ref 135–145)

## 2012-05-07 LAB — VANCOMYCIN, TROUGH: Vancomycin Tr: 17.1 ug/mL (ref 10.0–20.0)

## 2012-05-07 LAB — LEGIONELLA ANTIGEN, URINE: Legionella Antigen, Urine: NEGATIVE

## 2012-05-07 MED ORDER — METOPROLOL TARTRATE 1 MG/ML IV SOLN
5.0000 mg | Freq: Once | INTRAVENOUS | Status: AC
  Administered 2012-05-07: 5 mg via INTRAVENOUS
  Filled 2012-05-07: qty 5

## 2012-05-07 MED ORDER — GUAIFENESIN-CODEINE 100-10 MG/5ML PO SOLN
5.0000 mL | Freq: Four times a day (QID) | ORAL | Status: DC | PRN
  Administered 2012-05-07 – 2012-05-08 (×2): 5 mL via ORAL
  Filled 2012-05-07 (×2): qty 5

## 2012-05-07 MED ORDER — LISINOPRIL 40 MG PO TABS
40.0000 mg | ORAL_TABLET | Freq: Every day | ORAL | Status: DC
  Administered 2012-05-07 – 2012-05-08 (×2): 40 mg via ORAL
  Filled 2012-05-07 (×2): qty 1

## 2012-05-07 MED ORDER — DIPHENHYDRAMINE HCL 25 MG PO CAPS
25.0000 mg | ORAL_CAPSULE | Freq: Once | ORAL | Status: AC
  Administered 2012-05-07: 25 mg via ORAL
  Filled 2012-05-07: qty 1

## 2012-05-07 MED ORDER — HYDRALAZINE HCL 50 MG PO TABS
75.0000 mg | ORAL_TABLET | Freq: Three times a day (TID) | ORAL | Status: DC
  Administered 2012-05-07 – 2012-05-08 (×3): 75 mg via ORAL
  Filled 2012-05-07 (×6): qty 1

## 2012-05-07 NOTE — Progress Notes (Signed)
[facility] PROGRESS NOTE  Jerry Edwards [id number] DOB: Jul 24, 1970 DOA: 05/06/2012 PCP: [name], MD   Brief narrative: 42 year old male with osteogenesis imperfecta, CHF with EF of 25-30% on recent echo 2 months back, malignant hypertension who presented to the ED with a three-day history of cough with clear sputum and worsening shortness of breath.  Assessment/Plan: HCAP (healthcare-associated pneumonia)  Admit to telemetry. Given recent hospitalization in past 2 months we'll treat as HCP with both spectrum antibiotics including IV vancomycin cefepime and Levaquin. Follow sputum and blood culture.  Patient wheezy on admission. Continue with albuterol nebs. Continue antitussives.   Active Problems:  History of systolic CHF  Patient has EF of 20-30% on recent 2-D echo. Has trace edema on exam with elevated proBNP.increased his Lasix dose to 40 mg twice a day. Symptoms better today. Follows with [facility] cardiology who can be consulted as needed   Hypertensive urgency the setting of uncontrolled hypertension  Blood pressure slightly improved after receiving IV Lasix and labetalol in the ED. resume medications . Continue Coreg. Blood pressure still elevated will increase lisinopril and hydralazine dose. Continue amlodipine. Continue telemetry monitoring   Anemia  Stable at baseline   DVT prophylaxis: Subcutaneous Lovenox   Code Status: Full code  Family Communication: Brother at bedside  Disposition Plan: Home once stable        Consultants:  None  Procedures:  None  Antibiotics:  IV vancomycin, Zosyn and Levaquin (day 2)  HPI/Subjective: Informs his shortness of breath to be better however complains of productive cough persistent. Afebrile.  Objective: Filed Vitals:   05/07/12 0500 05/07/12 0843 05/07/12 1017 05/07/12 1325  BP:  145/81 145/94 135/82  Pulse:  91 97 82  Temp:   99.2 F (37.3 C) 98.4 F (36.9 C)  TempSrc:   Oral Oral  Resp:   18  20  Height: 4\' 7"  (1.397 m)     Weight: 78.2 kg (172 lb 6.4 oz)     SpO2:  95% 93% 98%    Intake/Output Summary (Last 24 hours) at 05/07/12 1558 Last data filed at 05/07/12 1325  Gross per 24 hour  Intake    120 ml  Output    625 ml  Net   -505 ml   Filed Weights   05/06/12 1608 05/07/12 0500  Weight: 66.679 kg (147 lb) 78.2 kg (172 lb 6.4 oz)    Exam:   General:  Middle aged male lying in bed in no acute distress  HEENT: No pallor, moist oral mucosa, no JVD  Cardiovascular: Normal S1 and S2, no murmurs rub or gallop  Respiratory: Clear to auscultation bilaterally no added sounds  Abdomen: Soft, nontender, nondistended, bowel sounds present  Extremities: Short lower extremity, warm, no edema  CNS: AAO x3  Data Reviewed: Basic Metabolic Panel:  Recent Labs Lab 05/06/12 0840 05/07/12 0540  NA 136 136  K 4.1 3.6  CL 100 100  CO2 24 27  GLUCOSE 89 110*  BUN 13 11  CREATININE 0.75 0.78  CALCIUM 8.4 8.3*   Liver Function Tests:  Recent Labs Lab 05/06/12 0840  AST 57*  ALT 38  ALKPHOS 77  BILITOT 1.7*  PROT 7.3  ALBUMIN 2.9*   No results found for this basename: LIPASE, AMYLASE,  in the last 168 hours No results found for this basename: AMMONIA,  in the last 168 hours CBC:  Recent Labs Lab 05/06/12 0840  WBC 4.4  NEUTROABS 2.6  HGB 11.7*  HCT 38.3*  MCV  87.8  PLT 240   Cardiac Enzymes:  Recent Labs Lab 05/06/12 0924  TROPONINI <0.30   BNP (last 3 results)  Recent Labs  02/26/12 0445 03/15/12 0014 05/06/12 0840  PROBNP 8246.0* 10243.0* 9525.0*   CBG: No results found for this basename: GLUCAP,  in the last 168 hours  Recent Results (from the past 240 hour(s))  CULTURE, EXPECTORATED SPUTUM-ASSESSMENT     Status: None   Collection Time    05/06/12  4:46 PM      Result Value Range Status   Specimen Description SPUTUM   Final   Special Requests Immunocompromised   Final   Sputum evaluation     Final   Value: MICROSCOPIC  FINDINGS SUGGEST THAT THIS SPECIMEN IS NOT REPRESENTATIVE OF LOWER RESPIRATORY SECRETIONS. PLEASE RECOLLECT.     Gram Stain Report Called to,Read Back By and Verified With: [id number]   Report Status 05/06/2012 FINAL   Final  CULTURE, EXPECTORATED SPUTUM-ASSESSMENT     Status: None   Collection Time    05/06/12  6:39 PM      Result Value Range Status   Specimen Description SPUTUM   Final   Special Requests NONE   Final   Sputum evaluation     Final   Value: THIS SPECIMEN IS ACCEPTABLE. RESPIRATORY CULTURE REPORT TO FOLLOW.   Report Status 05/06/2012 FINAL   Final  CULTURE, RESPIRATORY (NON-EXPECTORATED)     Status: None   Collection Time    05/06/12  6:39 PM      Result Value Range Status   Specimen Description SPUTUM   Final   Special Requests NONE   Final   Gram Stain     Final   Value: MODERATE WBC PRESENT, PREDOMINANTLY PMN     RARE SQUAMOUS EPITHELIAL CELLS PRESENT     MODERATE GRAM POSITIVE COCCI     IN PAIRS IN CHAINS IN CLUSTERS FEW GRAM POSITIVE RODS     FEW GRAM NEGATIVE RODS   Culture PENDING   Incomplete   Report Status PENDING   Incomplete     Studies: Dg Chest 2 View  05/06/2012  *RADIOLOGY REPORT*  Clinical Data: Cough and shortness of breath.  CHEST - 2 VIEW  Comparison: Single view of the chest 03/15/2012 and CT chest 02/23/2012.  Findings: There is marked cardiomegaly without pulmonary edema.  No consolidative process, pneumothorax or effusion is identified. Thoracic spine compression fractures at all levels consistent with history of osteogenesis imperfecta appear unchanged.  IMPRESSION: Cardiomegaly without acute disease.   Original Report Authenticated By: Orlean Patten, M.D.    Ct Angio Chest W/cm &/or Wo Cm  05/06/2012  *RADIOLOGY REPORT*  Clinical Data: Short of breath, hypoxia, cough  CT ANGIOGRAPHY CHEST  Technique:  Multidetector CT imaging of the chest using the standard protocol during bolus administration of intravenous contrast.  Multiplanar reconstructed images including MIPs were obtained and reviewed to evaluate the vascular anatomy.  Contrast: 13mL OMNIPAQUE IOHEXOL 350 MG/ML SOLN  Comparison: CT angio chest of 02/23/2012  Findings: The pulmonary arteries are moderately well opacified and there is no evidence of acute pulmonary embolism.  The thoracic aorta also opacifies with no acute abnormality noted.  The origins of the great vessels appear patent.  No mediastinal or hilar adenopathy is seen.  A posterior right hilar lymph node is stable with a short-axis diameter of 17 mm. Borderline cardiomegaly is stable. The upper abdomen is unremarkable.  On the lung window images there is parenchymal infiltrate within the right  upper lobe most consistent with right upper lobe pneumonia.  No central endobronchial lesion is seen.  Mild atelectasis is noted in both lower lobes.  No pleural effusion is seen.  The bones are osteopenic and there is deformity of multiple vertebral bodies in this patient with history of osteogenesis imperfecta. Thoracolumbar scoliosis is noted.  IMPRESSION:  1.  No evidence of acute pulmonary embolism. 2.  Parenchymal opacity in the right upper lobe most consistent with right upper lobe pneumonia. 3.  Multiple vertebral deformities in this patient with osteogenesis imperfecta.   Original Report Authenticated By: Ivar Drape, M.D.     Scheduled Meds: . aspirin  325 mg Oral Daily  . carvedilol  25 mg Oral BID WC  . ceFEPime (MAXIPIME) IV  1 g Intravenous Q8H  . enoxaparin (LOVENOX) injection  40 mg Subcutaneous Q24H  . furosemide  40 mg Oral BID  . hydrALAZINE  75 mg Oral Q8H  . levofloxacin (LEVAQUIN) IV  750 mg Intravenous Q24H  . lisinopril  40 mg Oral Daily  . potassium chloride SA  20 mEq Oral Daily  . sodium chloride  3 mL Intravenous Q12H  . sodium chloride  3 mL Intravenous Q12H  . vancomycin  1,000 mg Intravenous Q8H   Continuous Infusions:     Time spent: 25 minutes    Lakeithia Rasor,  [place]  [facility] Pager [phone] If 8PM-8AM, please contact night-coverage at [email or web address], password [facility] 05/07/2012, 3:58 PM  LOS: 1 day

## 2012-05-07 NOTE — Progress Notes (Signed)
ANTIBIOTIC CONSULT NOTE - FOLLOW UP  Pharmacy Consult for Vanc, abx renal adjustment Indication: suspected HCAP  Allergies  Allergen Reactions  . Fish Allergy Anaphylaxis  . Peanuts (Peanut Oil) Anaphylaxis  . Shellfish Allergy Anaphylaxis  . Latex Itching    Patient Measurements: Height: 4\' 7"  (139.7 cm) Weight: 172 lb 6.4 oz (78.2 kg) (pt states RN on 2/14 got inaccurate weight with bed per RN.) IBW/kg (Calculated) : 38.5  Vital Signs: Temp: 98.4 F (36.9 C) (02/15 1325) Temp src: Oral (02/15 1325) BP: 160/93 mmHg (02/15 1742) Pulse Rate: 91 (02/15 1742) Intake/Output from previous day:   Intake/Output from this shift: Total I/O In: 120 [P.O.:120] Out: 825 [Urine:825]  Labs:  Recent Labs  05/06/12 0840 05/07/12 0540  WBC 4.4  --   HGB 11.7*  --   PLT 240  --   CREATININE 0.75 0.78   Estimated Creatinine Clearance: 92.6 ml/min (by C-G formula based on Cr of 0.78).  Recent Labs  05/07/12 1653  VANCOTROUGH 17.1     Assessment:  60 yom presented 2/14 with cough, SOB, subjective fever. CT angio negative for PE, but with RUL PNA. Broad abx for HCAP give recent hospitalization.  Today is D#2 Vanc, Cefepime, D2/3 Levaquin per protocol.  Patient is Afeb, WBC wnl, Scr 0.78, CG CrCl 92 ml/min, Normalized CrCl > 100 m/min.  Blood cultures and sputum culture pending.    Vancomycin trough returns therapeutic today at 17.1   Goal of Therapy:  Vancomycin trough level 15-20 mcg/ml Renal adjustment of Cefepime, Levaquin  Plan:   Continue Vancomycin 1gm IV q8h  Continue Cefepime 1g IV q8h  Levaquin 750 mg IV q24h set to end tomorrow  Pharmacy will f/u  Vanessa Lisbon, PharmD, BCPS Pager: [phone] 6:08 PM Pharmacy #: [id number]

## 2012-05-07 NOTE — Progress Notes (Signed)
UR completed 

## 2012-05-08 DIAGNOSIS — I509 Heart failure, unspecified: Secondary | ICD-10-CM | POA: Diagnosis not present

## 2012-05-08 DIAGNOSIS — J189 Pneumonia, unspecified organism: Secondary | ICD-10-CM | POA: Diagnosis not present

## 2012-05-08 DIAGNOSIS — I10 Essential (primary) hypertension: Secondary | ICD-10-CM | POA: Diagnosis not present

## 2012-05-08 MED ORDER — LISINOPRIL 40 MG PO TABS: 40.0000 mg | ORAL_TABLET | Freq: Every day | ORAL | Status: DC

## 2012-05-08 MED ORDER — GUAIFENESIN-CODEINE 100-10 MG/5ML PO SOLN: 5.0000 mL | Freq: Four times a day (QID) | ORAL | Status: DC | PRN

## 2012-05-08 MED ORDER — HYDRALAZINE HCL 25 MG PO TABS: 75.0000 mg | ORAL_TABLET | Freq: Three times a day (TID) | ORAL | Status: DC

## 2012-05-08 MED ORDER — LEVOFLOXACIN 750 MG PO TABS: 750.0000 mg | ORAL_TABLET | Freq: Every day | ORAL | Status: DC

## 2012-05-08 NOTE — Discharge Summary (Signed)
Physician Discharge Summary  Jerry Edwards [id number] DOB: 1970-05-14 DOA: 05/06/2012  PCP: [name], MD  Admit date: 05/06/2012 Discharge date: 05/08/2012  Time spent: 40 minutes  Recommendations for Outpatient Follow-up:  1. Home with outpatient follow up with PCP and cardiology.  Discharge Diagnoses:  Principal Problem:   HCAP (healthcare-associated pneumonia)   Active Problems:   Uncontrolled hypertension   Anemia   Osteogenesis imperfecta   CHF (congestive heart failure)   Discharge Condition: Fair  Diet recommendation: Cardiac  Filed Weights   05/06/12 1608 05/07/12 0500 05/08/12 0604  Weight: 66.679 kg (147 lb) 78.2 kg (172 lb 6.4 oz) 77.111 kg (170 lb)    History of present illness:  Please refer to admission H&P for details but in brief 42 year old male with osteogenesis imperfecta, CHF with EF of 25-30% on recent echo 2 months back, malignant hypertension who presented to the ED with a three-day history of cough with clear sputum and worsening shortness of breath.   Hospital Course:     HCAP (healthcare-associated pneumonia)  Right upper lobe infiltrate noted on CT of the chest which was done to rule out PE. Admit to telemetry. Given recent hospitalization in past 2 months being treated as HC AP with broad spectrum antibiotics including IV vancomycin cefepime and Levaquin.  sputum and blood culture negative.  Patient wheezy on admission. Improved with albuterol nebs and antitussives. Remains afebrile without any leukocytosis and symptoms much better. I will discharge him on 3 more days of oral Levaquin to complete a five-day course.  Active Problems:  History of systolic CHF  Patient has EF of 20-30% on recent 2-D echo. Has trace edema on exam with elevated proBNP on presentation.increased his Lasix dose to 40 mg twice a day. Symptoms much improved now and I will discharge him on his home dose of Lasix  Hypertensive urgency the setting of  uncontrolled hypertension  Patient has issue with uncontrolled hypertension. I have increased his hydralazine to 75 mg 3 times a day and his lisinopril to 40 mg once a day. Continue Coreg and Lasix  Anemia  Stable at baseline   Code Status: Full code   This is stable for discharge home with outpatient follow up       Procedures:  None  Consultations:  None  Discharge Exam: Filed Vitals:   05/07/12 1742 05/07/12 2138 05/08/12 0604 05/08/12 0755  BP: 160/93 150/91 152/82 162/85  Pulse: 91 88 89 85  Temp:  100.2 F (37.9 C) 99.4 F (37.4 C)   TempSrc:  Oral Oral   Resp:  20 18 20   Height:      Weight:   77.111 kg (170 lb)   SpO2: 98% 95% 92% 95%    General: Middle aged male lying in bed in no acute distress  HEENT: No pallor, moist oral mucosa, no JVD  Cardiovascular: Normal S1 and S2, no murmurs rub or gallop  Respiratory: Clear to auscultation bilaterally no added sounds  Abdomen: Soft, nontender, nondistended, bowel sounds present  Extremities: Short lower extremity, warm, no edema  CNS: AAO x3   Discharge Instructions   Future Appointments Provider Department Dept Phone   05/09/2012 8:10 AM [facility] [employer] [facility] Main Office [place]) [phone]   07/11/2012 10:30 AM Thayer Headings, MD [facility] Main Office [place]) [phone]   07/11/2012 10:45 AM [facility] [place]) [phone]       Medication List    TAKE these medications  aspirin 325 MG EC tablet  Take 1 tablet (325 mg total) by mouth daily.     COREG 25 MG tablet  Generic drug:  carvedilol  Take 25 mg by mouth 2 (two) times daily with a meal.     furosemide 40 MG tablet  Commonly known as:  LASIX  Take 1 tablet (40 mg total) by mouth daily.     guaiFENesin-codeine 100-10 MG/5ML syrup  Take 5 mLs by mouth every 6 (six) hours as needed for cough.     hydrALAZINE 25 MG tablet  Commonly known as:  APRESOLINE  Take 3 tablets (75  mg total) by mouth every 8 (eight) hours.     levofloxacin 750 MG tablet  Commonly known as:  LEVAQUIN  Take 1 tablet (750 mg total) by mouth daily.     lisinopril 40 MG tablet  Commonly known as:  PRINIVIL,ZESTRIL  Take 1 tablet (40 mg total) by mouth daily.             potassium chloride SA 20 MEQ tablet  Commonly known as:  K-DUR,KLOR-CON  Take 1 tablet (20 mEq total) by mouth daily.           Follow-up Information   Follow up with BURNETT,BRENT A, MD In 1 week.   Contact information:   [address] [place] [address] [phone]        The results of significant diagnostics from this hospitalization (including imaging, microbiology, ancillary and laboratory) are listed below for reference.    Significant Diagnostic Studies: Dg Chest 2 View  05/06/2012  *RADIOLOGY REPORT*  Clinical Data: Cough and shortness of breath.  CHEST - 2 VIEW  Comparison: Single view of the chest 03/15/2012 and CT chest 02/23/2012.  Findings: There is marked cardiomegaly without pulmonary edema.  No consolidative process, pneumothorax or effusion is identified. Thoracic spine compression fractures at all levels consistent with history of osteogenesis imperfecta appear unchanged.  IMPRESSION: Cardiomegaly without acute disease.   Original Report Authenticated By: Orlean Patten, M.D.    Ct Angio Chest W/cm &/or Wo Cm  05/06/2012  *RADIOLOGY REPORT*  Clinical Data: Short of breath, hypoxia, cough  CT ANGIOGRAPHY CHEST  Technique:  Multidetector CT imaging of the chest using the standard protocol during bolus administration of intravenous contrast. Multiplanar reconstructed images including MIPs were obtained and reviewed to evaluate the vascular anatomy.  Contrast: 28mL OMNIPAQUE IOHEXOL 350 MG/ML SOLN  Comparison: CT angio chest of 02/23/2012  Findings: The pulmonary arteries are moderately well opacified and there is no evidence of acute pulmonary embolism.  The thoracic aorta also opacifies  with no acute abnormality noted.  The origins of the great vessels appear patent.  No mediastinal or hilar adenopathy is seen.  A posterior right hilar lymph node is stable with a short-axis diameter of 17 mm. Borderline cardiomegaly is stable. The upper abdomen is unremarkable.  On the lung window images there is parenchymal infiltrate within the right upper lobe most consistent with right upper lobe pneumonia.  No central endobronchial lesion is seen.  Mild atelectasis is noted in both lower lobes.  No pleural effusion is seen.  The bones are osteopenic and there is deformity of multiple vertebral bodies in this patient with history of osteogenesis imperfecta. Thoracolumbar scoliosis is noted.  IMPRESSION:  1.  No evidence of acute pulmonary embolism. 2.  Parenchymal opacity in the right upper lobe most consistent with right upper lobe pneumonia. 3.  Multiple vertebral deformities in this patient with osteogenesis  imperfecta.   Original Report Authenticated By: Ivar Drape, M.D.     Microbiology: Recent Results (from the past 240 hour(s))  CULTURE, EXPECTORATED SPUTUM-ASSESSMENT     Status: None   Collection Time    05/06/12  4:46 PM      Result Value Range Status   Specimen Description SPUTUM   Final   Special Requests Immunocompromised   Final   Sputum evaluation     Final   Value: MICROSCOPIC FINDINGS SUGGEST THAT THIS SPECIMEN IS NOT REPRESENTATIVE OF LOWER RESPIRATORY SECRETIONS. PLEASE RECOLLECT.     Gram Stain Report Called to,Read Back By and Verified With: [id number]   Report Status 05/06/2012 FINAL   Final  CULTURE, EXPECTORATED SPUTUM-ASSESSMENT     Status: None   Collection Time    05/06/12  6:39 PM      Result Value Range Status   Specimen Description SPUTUM   Final   Special Requests NONE   Final   Sputum evaluation     Final   Value: THIS SPECIMEN IS ACCEPTABLE. RESPIRATORY CULTURE REPORT TO FOLLOW.   Report Status 05/06/2012 FINAL   Final  CULTURE, RESPIRATORY  (NON-EXPECTORATED)     Status: None   Collection Time    05/06/12  6:39 PM      Result Value Range Status   Specimen Description SPUTUM   Final   Special Requests NONE   Final   Gram Stain     Final   Value: MODERATE WBC PRESENT, PREDOMINANTLY PMN     RARE SQUAMOUS EPITHELIAL CELLS PRESENT     MODERATE GRAM POSITIVE COCCI     IN PAIRS IN CHAINS IN CLUSTERS FEW GRAM POSITIVE RODS     FEW GRAM NEGATIVE RODS   Culture PENDING   Incomplete   Report Status PENDING   Incomplete     Labs: Basic Metabolic Panel:  Recent Labs Lab 05/06/12 0840 05/07/12 0540  NA 136 136  K 4.1 3.6  CL 100 100  CO2 24 27  GLUCOSE 89 110*  BUN 13 11  CREATININE 0.75 0.78  CALCIUM 8.4 8.3*   Liver Function Tests:  Recent Labs Lab 05/06/12 0840  AST 57*  ALT 38  ALKPHOS 77  BILITOT 1.7*  PROT 7.3  ALBUMIN 2.9*   No results found for this basename: LIPASE, AMYLASE,  in the last 168 hours No results found for this basename: AMMONIA,  in the last 168 hours CBC:  Recent Labs Lab 05/06/12 0840  WBC 4.4  NEUTROABS 2.6  HGB 11.7*  HCT 38.3*  MCV 87.8  PLT 240   Cardiac Enzymes:  Recent Labs Lab 05/06/12 0924  TROPONINI <0.30   BNP: BNP (last 3 results)  Recent Labs  02/26/12 0445 03/15/12 0014 05/06/12 0840  PROBNP 8246.0* 10243.0* 9525.0*   CBG: No results found for this basename: GLUCAP,  in the last 168 hours     Signed:  Viviann Broyles  [facility] 05/08/2012, 9:56 AM

## 2012-05-08 NOTE — Progress Notes (Signed)
05/08/12 1204 Reviewed discharge instructions with patient and his father. Both verbalized understanding. Patient received prescriptions and a copy of discharge instructions. IV removed. Telemetry box removed and given to CNA to clean.

## 2012-05-09 ENCOUNTER — Other Ambulatory Visit: Payer: Medicare Other

## 2012-05-09 LAB — CULTURE, RESPIRATORY W GRAM STAIN

## 2012-05-09 NOTE — ED Provider Notes (Signed)
Medical screening examination/treatment/procedure(s) were performed by non-physician practitioner and as supervising physician I was immediately available for consultation/collaboration.  Leota Jacobsen, MD 05/09/12 [phone]

## 2012-05-09 NOTE — Telephone Encounter (Signed)
Pt was hospitalized for pneumonia. htn med's were changed also, today's BP 146/97 139/95, will continue to monitor and will get daily weights, he was told to call with 3-5 lb wt gain. He agreed to plan.

## 2012-05-13 LAB — CULTURE, BLOOD (ROUTINE X 2): Culture: NO GROWTH

## 2012-06-02 ENCOUNTER — Telehealth: Payer: Self-pay | Admitting: Cardiovascular Disease

## 2012-06-02 NOTE — Telephone Encounter (Signed)
Pt eeds refill of  , klor kon, furosimide, [facility]

## 2012-06-02 NOTE — Telephone Encounter (Signed)
Called pharmacy and states pt has refills left. Pt is informed

## 2012-06-21 ENCOUNTER — Telehealth: Payer: Self-pay

## 2012-06-21 DIAGNOSIS — I509 Heart failure, unspecified: Secondary | ICD-10-CM

## 2012-06-21 DIAGNOSIS — R0602 Shortness of breath: Secondary | ICD-10-CM

## 2012-06-21 DIAGNOSIS — D649 Anemia, unspecified: Secondary | ICD-10-CM

## 2012-06-21 MED ORDER — FUROSEMIDE 40 MG PO TABS: 40.0000 mg | ORAL_TABLET | Freq: Every day | ORAL | Status: DC

## 2012-06-21 MED ORDER — HYDRALAZINE HCL 25 MG PO TABS: 25.0000 mg | ORAL_TABLET | Freq: Three times a day (TID) | ORAL | Status: DC

## 2012-06-21 MED ORDER — LISINOPRIL 40 MG PO TABS: 40.0000 mg | ORAL_TABLET | Freq: Every day | ORAL | Status: DC

## 2012-06-21 NOTE — Telephone Encounter (Signed)
Pt called to rqst refills for lasix, lisinopril, hctz. 30 R-3 for each completed.

## 2012-07-11 ENCOUNTER — Encounter: Payer: Self-pay | Admitting: Cardiovascular Disease

## 2012-07-11 ENCOUNTER — Other Ambulatory Visit (INDEPENDENT_AMBULATORY_CARE_PROVIDER_SITE_OTHER): Payer: Medicare Other

## 2012-07-11 ENCOUNTER — Encounter: Payer: Self-pay | Admitting: *Deleted

## 2012-07-11 ENCOUNTER — Ambulatory Visit (INDEPENDENT_AMBULATORY_CARE_PROVIDER_SITE_OTHER): Payer: Medicare Other | Admitting: Cardiovascular Disease

## 2012-07-11 VITALS — BP 140/86 | HR 89 | Ht <= 58 in | Wt 180.8 lb

## 2012-07-11 DIAGNOSIS — D649 Anemia, unspecified: Secondary | ICD-10-CM

## 2012-07-11 DIAGNOSIS — I509 Heart failure, unspecified: Secondary | ICD-10-CM | POA: Diagnosis not present

## 2012-07-11 DIAGNOSIS — R0602 Shortness of breath: Secondary | ICD-10-CM

## 2012-07-11 DIAGNOSIS — I10 Essential (primary) hypertension: Secondary | ICD-10-CM

## 2012-07-11 LAB — BASIC METABOLIC PANEL
BUN: 16 mg/dL (ref 6–23)
Chloride: 102 mEq/L (ref 96–112)
Potassium: 3.4 mEq/L — ABNORMAL LOW (ref 3.5–5.1)
Sodium: 138 mEq/L (ref 135–145)

## 2012-07-11 LAB — CBC
HCT: 43.7 % (ref 39.0–52.0)
Hemoglobin: 14 g/dL (ref 13.0–17.0)
Platelets: 211 10*3/uL (ref 150.0–400.0)
RBC: 5.03 Mil/uL (ref 4.22–5.81)
RDW: 14.4 % (ref 11.5–14.6)
WBC: 7.3 10*3/uL (ref 4.5–10.5)

## 2012-07-11 NOTE — Patient Instructions (Addendum)
Your physician wants you to follow-up in: 6 months  You will receive a reminder letter in the mail two months in advance. If you don't receive a letter, please call our office to schedule the follow-up appointment.  Your physician recommends that you continue on your current medications as directed. Please refer to the Current Medication list given to you today.  
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
## 2012-07-11 NOTE — Assessment & Plan Note (Signed)
Continue current medications. 

## 2012-07-11 NOTE — Assessment & Plan Note (Addendum)
His CHF symptoms are well controlled.  Cont. Current meds. BMP and BNP today.

## 2012-07-12 MED ORDER — FUROSEMIDE 40 MG PO TABS: 40.0000 mg | ORAL_TABLET | Freq: Every day | ORAL | Status: DC

## 2013-01-31 ENCOUNTER — Other Ambulatory Visit: Payer: Self-pay | Admitting: Cardiovascular Disease

## 2013-01-31 MED ORDER — LISINOPRIL 40 MG PO TABS: 40.0000 mg | ORAL_TABLET | Freq: Every day | ORAL | Status: DC

## 2013-02-01 ENCOUNTER — Other Ambulatory Visit: Payer: Self-pay

## 2013-02-01 MED ORDER — LISINOPRIL 40 MG PO TABS: 40.0000 mg | ORAL_TABLET | Freq: Every day | ORAL | Status: DC

## 2013-02-01 MED ORDER — LISINOPRIL 20 MG PO TABS: ORAL_TABLET | ORAL | Status: DC

## 2013-02-08 ENCOUNTER — Ambulatory Visit: Payer: Medicare Other | Admitting: Physician Assistant

## 2013-02-10 ENCOUNTER — Encounter: Payer: Self-pay | Admitting: Physician Assistant

## 2013-04-08 ENCOUNTER — Emergency Department (HOSPITAL_COMMUNITY): Payer: Medicare Other

## 2013-04-08 ENCOUNTER — Inpatient Hospital Stay (HOSPITAL_COMMUNITY)
Admission: EM | Admit: 2013-04-08 | Discharge: 2013-04-21 | DRG: 287 | Disposition: A | Discharge status: Home / Self Care | Payer: Medicare Other | Attending: Internal Medicine | Admitting: Internal Medicine

## 2013-04-08 ENCOUNTER — Encounter (HOSPITAL_COMMUNITY): Payer: Self-pay | Admitting: Emergency Medicine

## 2013-04-08 DIAGNOSIS — T502X5A Adverse effect of carbonic-anhydrase inhibitors, benzothiadiazides and other diuretics, initial encounter: Secondary | ICD-10-CM | POA: Diagnosis not present

## 2013-04-08 DIAGNOSIS — J189 Pneumonia, unspecified organism: Secondary | ICD-10-CM

## 2013-04-08 DIAGNOSIS — I5043 Acute on chronic combined systolic (congestive) and diastolic (congestive) heart failure: Secondary | ICD-10-CM | POA: Diagnosis not present

## 2013-04-08 DIAGNOSIS — E662 Morbid (severe) obesity with alveolar hypoventilation: Secondary | ICD-10-CM | POA: Diagnosis present

## 2013-04-08 DIAGNOSIS — N179 Acute kidney failure, unspecified: Secondary | ICD-10-CM | POA: Diagnosis not present

## 2013-04-08 DIAGNOSIS — I10 Essential (primary) hypertension: Secondary | ICD-10-CM | POA: Diagnosis not present

## 2013-04-08 DIAGNOSIS — R0609 Other forms of dyspnea: Secondary | ICD-10-CM | POA: Diagnosis not present

## 2013-04-08 DIAGNOSIS — I498 Other specified cardiac arrhythmias: Secondary | ICD-10-CM | POA: Diagnosis not present

## 2013-04-08 DIAGNOSIS — I509 Heart failure, unspecified: Secondary | ICD-10-CM | POA: Diagnosis not present

## 2013-04-08 DIAGNOSIS — F4321 Adjustment disorder with depressed mood: Secondary | ICD-10-CM | POA: Diagnosis present

## 2013-04-08 DIAGNOSIS — R079 Chest pain, unspecified: Secondary | ICD-10-CM | POA: Diagnosis not present

## 2013-04-08 DIAGNOSIS — I4729 Other ventricular tachycardia: Secondary | ICD-10-CM | POA: Diagnosis not present

## 2013-04-08 DIAGNOSIS — R918 Other nonspecific abnormal finding of lung field: Secondary | ICD-10-CM | POA: Diagnosis not present

## 2013-04-08 DIAGNOSIS — G4733 Obstructive sleep apnea (adult) (pediatric): Secondary | ICD-10-CM

## 2013-04-08 DIAGNOSIS — I5023 Acute on chronic systolic (congestive) heart failure: Secondary | ICD-10-CM

## 2013-04-08 DIAGNOSIS — I959 Hypotension, unspecified: Secondary | ICD-10-CM | POA: Diagnosis not present

## 2013-04-08 DIAGNOSIS — Z87891 Personal history of nicotine dependence: Secondary | ICD-10-CM | POA: Diagnosis not present

## 2013-04-08 DIAGNOSIS — Z8249 Family history of ischemic heart disease and other diseases of the circulatory system: Secondary | ICD-10-CM | POA: Diagnosis not present

## 2013-04-08 DIAGNOSIS — R0602 Shortness of breath: Secondary | ICD-10-CM | POA: Diagnosis not present

## 2013-04-08 DIAGNOSIS — Z91199 Patient's noncompliance with other medical treatment and regimen due to unspecified reason: Secondary | ICD-10-CM

## 2013-04-08 DIAGNOSIS — I472 Ventricular tachycardia: Secondary | ICD-10-CM | POA: Diagnosis not present

## 2013-04-08 DIAGNOSIS — R001 Bradycardia, unspecified: Secondary | ICD-10-CM

## 2013-04-08 DIAGNOSIS — I2789 Other specified pulmonary heart diseases: Secondary | ICD-10-CM | POA: Diagnosis not present

## 2013-04-08 DIAGNOSIS — Q780 Osteogenesis imperfecta: Secondary | ICD-10-CM

## 2013-04-08 DIAGNOSIS — R0789 Other chest pain: Secondary | ICD-10-CM | POA: Diagnosis not present

## 2013-04-08 DIAGNOSIS — I428 Other cardiomyopathies: Secondary | ICD-10-CM | POA: Diagnosis not present

## 2013-04-08 DIAGNOSIS — I5033 Acute on chronic diastolic (congestive) heart failure: Secondary | ICD-10-CM

## 2013-04-08 DIAGNOSIS — R141 Gas pain: Secondary | ICD-10-CM | POA: Diagnosis not present

## 2013-04-08 DIAGNOSIS — I272 Pulmonary hypertension, unspecified: Secondary | ICD-10-CM

## 2013-04-08 DIAGNOSIS — E876 Hypokalemia: Secondary | ICD-10-CM | POA: Diagnosis not present

## 2013-04-08 DIAGNOSIS — R0989 Other specified symptoms and signs involving the circulatory and respiratory systems: Secondary | ICD-10-CM | POA: Diagnosis not present

## 2013-04-08 DIAGNOSIS — Z9119 Patient's noncompliance with other medical treatment and regimen: Secondary | ICD-10-CM

## 2013-04-08 DIAGNOSIS — Z7982 Long term (current) use of aspirin: Secondary | ICD-10-CM

## 2013-04-08 DIAGNOSIS — R0600 Dyspnea, unspecified: Secondary | ICD-10-CM

## 2013-04-08 DIAGNOSIS — R140 Abdominal distension (gaseous): Secondary | ICD-10-CM

## 2013-04-08 LAB — BASIC METABOLIC PANEL
BUN: 20 mg/dL (ref 6–23)
CHLORIDE: 100 meq/L (ref 96–112)
CO2: 28 mEq/L (ref 19–32)
CREATININE: 1.07 mg/dL (ref 0.50–1.35)
Calcium: 8.6 mg/dL (ref 8.4–10.5)
GFR calc non Af Amer: 84 mL/min — ABNORMAL LOW (ref >90)
Glucose, Bld: 104 mg/dL — ABNORMAL HIGH (ref 70–99)
POTASSIUM: 3.5 meq/L — AB (ref 3.7–5.3)
SODIUM: 142 meq/L (ref 137–147)

## 2013-04-08 LAB — CBC WITH DIFFERENTIAL/PLATELET
BASOS ABS: 0 10*3/uL (ref 0.0–0.1)
Basophils Relative: 0 % (ref 0–1)
Eosinophils Absolute: 0.1 10*3/uL (ref 0.0–0.7)
Eosinophils Relative: 1 % (ref 0–5)
HCT: 42.6 % (ref 39.0–52.0)
Hemoglobin: 12.9 g/dL — ABNORMAL LOW (ref 13.0–17.0)
LYMPHS ABS: 1.7 10*3/uL (ref 0.7–4.0)
LYMPHS PCT: 25 % (ref 12–46)
MCH: 28.1 pg (ref 26.0–34.0)
MCHC: 30.3 g/dL (ref 30.0–36.0)
MCV: 92.8 fL (ref 78.0–100.0)
Monocytes Absolute: 0.7 10*3/uL (ref 0.1–1.0)
Monocytes Relative: 10 % (ref 3–12)
NEUTROS ABS: 4.5 10*3/uL (ref 1.7–7.7)
NEUTROS PCT: 64 % (ref 43–77)
PLATELETS: 256 10*3/uL (ref 150–400)
RBC: 4.59 MIL/uL (ref 4.22–5.81)
RDW: 14.2 % (ref 11.5–15.5)
WBC: 7.1 10*3/uL (ref 4.0–10.5)

## 2013-04-08 LAB — TROPONIN I: Troponin I: <0.3 ng/mL (ref <0.30)

## 2013-04-08 LAB — PRO B NATRIURETIC PEPTIDE: PRO B NATRI PEPTIDE: 6010 pg/mL — AB (ref 0–125)

## 2013-04-08 LAB — MAGNESIUM: MAGNESIUM: 1.9 mg/dL (ref 1.5–2.5)

## 2013-04-08 MED ORDER — POTASSIUM CHLORIDE CRYS ER 20 MEQ PO TBCR
20.0000 meq | EXTENDED_RELEASE_TABLET | Freq: Every day | ORAL | Status: DC
  Administered 2013-04-08: 20 meq via ORAL
  Filled 2013-04-08: qty 1

## 2013-04-08 MED ORDER — ENOXAPARIN SODIUM 40 MG/0.4ML ~~LOC~~ SOLN
40.0000 mg | SUBCUTANEOUS | Status: DC
  Administered 2013-04-08 – 2013-04-21 (×13): 40 mg via SUBCUTANEOUS
  Filled 2013-04-08 (×14): qty 0.4

## 2013-04-08 MED ORDER — CARVEDILOL 25 MG PO TABS
25.0000 mg | ORAL_TABLET | Freq: Two times a day (BID) | ORAL | Status: DC
  Administered 2013-04-08 – 2013-04-16 (×17): 25 mg via ORAL
  Filled 2013-04-08 (×21): qty 1
  Filled 2013-04-08: qty 2

## 2013-04-08 MED ORDER — SODIUM CHLORIDE 0.9 % IJ SOLN
3.0000 mL | Freq: Two times a day (BID) | INTRAMUSCULAR | Status: DC
  Administered 2013-04-08 – 2013-04-21 (×19): 3 mL via INTRAVENOUS

## 2013-04-08 MED ORDER — ONDANSETRON HCL 4 MG PO TABS: 4.0000 mg | ORAL_TABLET | Freq: Four times a day (QID) | ORAL | Status: DC | PRN

## 2013-04-08 MED ORDER — LISINOPRIL 20 MG PO TABS
20.0000 mg | ORAL_TABLET | Freq: Two times a day (BID) | ORAL | Status: DC
  Administered 2013-04-08 – 2013-04-15 (×16): 20 mg via ORAL
  Filled 2013-04-08 (×20): qty 1

## 2013-04-08 MED ORDER — ACETAMINOPHEN 650 MG RE SUPP: 650.0000 mg | Freq: Four times a day (QID) | RECTAL | Status: DC | PRN

## 2013-04-08 MED ORDER — NITROGLYCERIN 2 % TD OINT
1.0000 [in_us] | TOPICAL_OINTMENT | Freq: Four times a day (QID) | TRANSDERMAL | Status: DC
  Administered 2013-04-08 (×2): 1 [in_us] via TOPICAL
  Filled 2013-04-08 (×2): qty 30

## 2013-04-08 MED ORDER — ONDANSETRON HCL 4 MG/2ML IJ SOLN: 4.0000 mg | Freq: Four times a day (QID) | INTRAMUSCULAR | Status: DC | PRN

## 2013-04-08 MED ORDER — POTASSIUM CHLORIDE 20 MEQ/15ML (10%) PO LIQD
20.0000 meq | Freq: Two times a day (BID) | ORAL | Status: DC
  Administered 2013-04-08 – 2013-04-17 (×19): 20 meq via ORAL
  Filled 2013-04-08 (×25): qty 15

## 2013-04-08 MED ORDER — POTASSIUM CHLORIDE CRYS ER 20 MEQ PO TBCR
40.0000 meq | EXTENDED_RELEASE_TABLET | Freq: Once | ORAL | Status: AC
  Administered 2013-04-08: 40 meq via ORAL
  Filled 2013-04-08: qty 2

## 2013-04-08 MED ORDER — ISOSORBIDE DINITRATE 10 MG PO TABS
10.0000 mg | ORAL_TABLET | Freq: Three times a day (TID) | ORAL | Status: DC
  Administered 2013-04-08 – 2013-04-10 (×7): 10 mg via ORAL
  Filled 2013-04-08 (×9): qty 1

## 2013-04-08 MED ORDER — ACETAMINOPHEN 325 MG PO TABS
650.0000 mg | ORAL_TABLET | Freq: Four times a day (QID) | ORAL | Status: DC | PRN
  Administered 2013-04-12: 650 mg via ORAL
  Filled 2013-04-08: qty 2

## 2013-04-08 MED ORDER — ASPIRIN EC 325 MG PO TBEC
325.0000 mg | DELAYED_RELEASE_TABLET | Freq: Every day | ORAL | Status: DC
  Administered 2013-04-08 – 2013-04-13 (×5): 325 mg via ORAL
  Filled 2013-04-08 (×7): qty 1

## 2013-04-08 MED ORDER — POTASSIUM CHLORIDE CRYS ER 20 MEQ PO TBCR
20.0000 meq | EXTENDED_RELEASE_TABLET | Freq: Two times a day (BID) | ORAL | Status: DC
  Filled 2013-04-08 (×2): qty 1

## 2013-04-08 MED ORDER — FUROSEMIDE 10 MG/ML IJ SOLN
40.0000 mg | Freq: Once | INTRAMUSCULAR | Status: AC
  Administered 2013-04-08: 40 mg via INTRAVENOUS
  Filled 2013-04-08: qty 4

## 2013-04-08 MED ORDER — FUROSEMIDE 10 MG/ML IJ SOLN
40.0000 mg | Freq: Two times a day (BID) | INTRAMUSCULAR | Status: DC
  Administered 2013-04-08 – 2013-04-09 (×3): 40 mg via INTRAVENOUS
  Filled 2013-04-08 (×5): qty 4

## 2013-04-08 MED ORDER — HYDRALAZINE HCL 25 MG PO TABS
25.0000 mg | ORAL_TABLET | Freq: Four times a day (QID) | ORAL | Status: DC
Start: 2013-04-08 — End: 2013-04-14
  Administered 2013-04-08 – 2013-04-14 (×23): 25 mg via ORAL
  Filled 2013-04-08 (×32): qty 1

## 2013-04-08 MED ORDER — HYDRALAZINE HCL 20 MG/ML IJ SOLN: 10.0000 mg | INTRAMUSCULAR | Status: DC | PRN
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
## 2013-04-08 NOTE — ED Notes (Signed)
Patient states that for the past week he has been experiencing shortness of breath and chest tightness (not pain).  "Feels like a fat person is sitting on my chest and I'm not getting enough air".

## 2013-04-08 NOTE — Progress Notes (Signed)
  Echocardiogram 2D Echocardiogram has been performed.  Jerry Edwards 04/08/2013, 10:33 AM

## 2013-04-08 NOTE — Progress Notes (Signed)
[facility] PROGRESS NOTE   Jerry Edwards [id number] DOB: 1970/10/31 DOA: 04/08/2013 PCP: [name], MD  Brief narrative: Jerry Edwards is an 43 y.o. male with a PMH of osteogenesis imperfecta, hypertension and nonischemic cardiomyopathy/chronic systolic CHF, last 2-D echo done 02/24/2012 with EF 25-30%, diffuse hypokinesis, and normal diastolic parameters who presented to the ED on 04/08/13 with a chief complaint of shortness of breath and chest pain. Initial evaluation in the ED included chest x-ray which showed vascular congestion and cardiomegaly. EKG showed sinus tachycardia. ProBNP was 6010.  Assessment/Plan: Principal Problem:   Systolic CHF, acute on chronic F/U repeat 2 D Echo. Known chronic systolic CHF with EF [id number]. Was given Lasix 40 mg IV x1. Normally takes Lasix 40 mg orally daily. We'll place on Lasix 40 mg IV twice a day. Monitor intake/output and daily weights. Active Problems:   Chest pain 12-lead EKG nonacute. Continue aspirin, beta blocker. Troponin I not elevated. Cycle for 2 more sets.   Osteogenesis imperfecta No specific treatment, recommend outpatient surveillance for osteoporosis.   HTN (hypertension), malignant Continue Coreg, hydralazine, Isordil, NTG paste and lisinopril. Needs better blood pressure control with systolic blood pressure ranging [id number] and diastolic blood pressure ranging 110s.  Code Status: Full. Family Communication: Multiple family updated at bedside. Disposition Plan: Likely home tomorrow.   IV access:  Peripheral IV  Medical Consultants:  None.  Other Consultants:  None.  Anti-infectives:  None.  HPI/Subjective: Jerry Edwards says his breathing has improved.  He denies chest pain and tightness (although he reports having had some earlier today). No cough.  Objective: Filed Vitals:   04/08/13 [id number] 04/08/13 0615 04/08/13 0639 04/08/13 0650  BP:  161/112 174/118   Pulse:  95    Temp:  97.5 F (36.4 C)     TempSrc:  Axillary    Resp:      Height: 4\' 9"  (1.448 m)     Weight: 94.5 kg (208 lb 5.4 oz)   93.9 kg (207 lb 0.2 oz)  SpO2:  97%      Intake/Output Summary (Last 24 hours) at 04/08/13 0836 Last data filed at 04/08/13 0741  Gross per 24 hour  Intake      0 ml  Output    225 ml  Net   -225 ml    Exam: Gen:  NAD Cardiovascular:  RRR, No M/R/G Respiratory:  Lungs CTAB Gastrointestinal:  Abdomen soft, NT/ND, + BS Extremities:  2+ edema  Data Reviewed: Basic Metabolic Panel:  Recent Labs Lab 04/08/13 0258 04/08/13 0447  NA 142  --   K 3.5*  --   CL 100  --   CO2 28  --   GLUCOSE 104*  --   BUN 20  --   CREATININE 1.07  --   CALCIUM 8.6  --   MG  --  1.9   GFR Estimated Creatinine Clearance: 80.6 ml/min (by C-G formula based on Cr of 1.07).  CBC:  Recent Labs Lab 04/08/13 0258  WBC 7.1  NEUTROABS 4.5  HGB 12.9*  HCT 42.6  MCV 92.8  PLT 256   Cardiac Enzymes:  Recent Labs Lab 04/08/13 0258  TROPONINI <0.30   BNP (last 3 results)  Recent Labs  05/06/12 0840 07/11/12 1117 04/08/13 0258  PROBNP 9525.0* 214.0* 6010.0*     Procedures and Diagnostic Studies: Dg Chest Port 1 View  04/08/2013   CLINICAL DATA:  Dyspnea, cough and congestion.  History of smoking.  EXAM: PORTABLE CHEST - 1 VIEW  COMPARISON:  Chest radiograph and CTA of the chest performed 05/06/2012  FINDINGS: The lungs are mildly hypoexpanded. Vascular congestion is noted, with question of mild interstitial edema. No definite pleural effusion or pneumothorax is seen.  The cardiomediastinal silhouette is enlarged, as on the prior study. No acute osseous abnormalities are seen.  IMPRESSION: Lungs mildly hypoexpanded. Vascular congestion and cardiomegaly, with question of mild interstitial edema.   Electronically Signed   By: Garald Balding M.D.   On: 04/08/2013 03:25    Scheduled Meds: . aspirin  325 mg Oral Daily  . carvedilol  25 mg Oral BID WC  . enoxaparin (LOVENOX) injection  40  mg Subcutaneous Q24H  . hydrALAZINE  25 mg Oral Q6H  . isosorbide dinitrate  10 mg Oral TID  . lisinopril  20 mg Oral BID  . nitroGLYCERIN  1 inch Topical Q6H  . potassium chloride SA  20 mEq Oral Daily  . sodium chloride  3 mL Intravenous Q12H   Continuous Infusions:   Time spent: 30 minutes.   LOS: 0 days   Jerry Edwards  [facility] Pager [phone].   *Please note that the hospitalists switch teams on Wednesdays. Please call the flow manager at [phone] if you are having difficulty reaching the hospitalist taking care of this patient as she can update you and provide the most up-to-date pager number of provider caring for the patient. If 8PM-8AM, please contact night-coverage at [email or web address], password [facility]  04/08/2013, 8:36 AM
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
## 2013-04-09 DIAGNOSIS — R141 Gas pain: Secondary | ICD-10-CM

## 2013-04-09 DIAGNOSIS — R142 Eructation: Secondary | ICD-10-CM

## 2013-04-09 DIAGNOSIS — R143 Flatulence: Secondary | ICD-10-CM

## 2013-04-09 DIAGNOSIS — G4733 Obstructive sleep apnea (adult) (pediatric): Secondary | ICD-10-CM

## 2013-04-09 LAB — COMPREHENSIVE METABOLIC PANEL
ALT: 29 U/L (ref 0–53)
AST: 34 U/L (ref 0–37)
Albumin: 2.8 g/dL — ABNORMAL LOW (ref 3.5–5.2)
Alkaline Phosphatase: 65 U/L (ref 39–117)
BUN: 25 mg/dL — ABNORMAL HIGH (ref 6–23)
CALCIUM: 9.1 mg/dL (ref 8.4–10.5)
CO2: 28 meq/L (ref 19–32)
Chloride: 102 mEq/L (ref 96–112)
Creatinine, Ser: 1.07 mg/dL (ref 0.50–1.35)
GFR, EST NON AFRICAN AMERICAN: 84 mL/min — AB (ref >90)
GLUCOSE: 120 mg/dL — AB (ref 70–99)
Potassium: 4.2 mEq/L (ref 3.7–5.3)
Sodium: 141 mEq/L (ref 137–147)
Total Bilirubin: 0.6 mg/dL (ref 0.3–1.2)
Total Protein: 6.9 g/dL (ref 6.0–8.3)

## 2013-04-09 LAB — CBC
HEMATOCRIT: 39.6 % (ref 39.0–52.0)
HEMOGLOBIN: 11.6 g/dL — AB (ref 13.0–17.0)
MCH: 27.3 pg (ref 26.0–34.0)
MCHC: 29.3 g/dL — AB (ref 30.0–36.0)
MCV: 93.2 fL (ref 78.0–100.0)
Platelets: 237 10*3/uL (ref 150–400)
RBC: 4.25 MIL/uL (ref 4.22–5.81)
RDW: 14.1 % (ref 11.5–15.5)
WBC: 6.2 10*3/uL (ref 4.0–10.5)

## 2013-04-09 LAB — PROTIME-INR
INR: 1.05 (ref 0.00–1.49)
PROTHROMBIN TIME: 13.5 s (ref 11.6–15.2)

## 2013-04-09 MED ORDER — SIMETHICONE 80 MG PO CHEW
80.0000 mg | CHEWABLE_TABLET | Freq: Four times a day (QID) | ORAL | Status: DC
  Administered 2013-04-09 – 2013-04-11 (×9): 80 mg via ORAL
  Filled 2013-04-09 (×12): qty 1

## 2013-04-09 MED ORDER — SACCHAROMYCES BOULARDII 250 MG PO CAPS
250.0000 mg | ORAL_CAPSULE | Freq: Two times a day (BID) | ORAL | Status: DC
  Administered 2013-04-09 – 2013-04-11 (×5): 250 mg via ORAL
  Filled 2013-04-09 (×6): qty 1

## 2013-04-09 MED ORDER — FUROSEMIDE 40 MG PO TABS
40.0000 mg | ORAL_TABLET | Freq: Two times a day (BID) | ORAL | Status: DC
  Administered 2013-04-09 – 2013-04-10 (×2): 40 mg via ORAL
  Filled 2013-04-09 (×4): qty 1

## 2013-04-09 NOTE — Progress Notes (Signed)
CMT notified RN that patient's HR was bradycardic.  RN checked on patient, at that time HR was 47; 119/77.  PCP on call was notified. Nitroglycerin at 0000 is to be held. Will keep monitoring patient.

## 2013-04-09 NOTE — Progress Notes (Signed)
Patient had a pause of 2.44 seconds. Patient was sleeping at the time and may have sleep apnea? PCP on call was notified. Awaiting any new orders.

## 2013-04-09 NOTE — Progress Notes (Signed)
[facility] PROGRESS NOTE   Jerry Edwards [id number] DOB: 10-Jul-1970 DOA: 04/08/2013 PCP: [name], MD  Brief narrative: Jerry Edwards is an 43 y.o. male with a PMH of osteogenesis imperfecta, hypertension and nonischemic cardiomyopathy/chronic systolic CHF, last 2-D echo done 02/24/2012 with EF 25-30%, diffuse hypokinesis, and normal diastolic parameters who presented to the ED on 04/08/13 with a chief complaint of shortness of breath and chest pain. Initial evaluation in the ED included chest x-ray which showed vascular congestion and cardiomegaly. EKG showed sinus tachycardia. ProBNP was 6010.  Assessment/Plan: Principal Problem:   Systolic CHF, acute on chronic Repeat 2 D Echo with EF 20-25% (prior EF 25-30%). Currently on Lasix 40 mg IV twice a day. I/O balance equal, weight down 0.5 lb.  Appears compensated.  D/C nitropaste.  Change Lasix to oral route and monitor. Active Problems:   Abdominal bloating Simethicone and probiotics ordered.   ? OSA/OHS RN reports that patient's oxygen saturations drop, BP rises and HR rises in sleep. ? OSA.  Recommend outpatient sleep study.   Chest pain 12-lead EKG nonacute. Continue aspirin, beta blocker. Troponin I negative x 3 sets.   Osteogenesis imperfecta No specific treatment, recommend outpatient surveillance for osteoporosis.   HTN (hypertension), malignant Continue Coreg, hydralazine, Isordil and lisinopril. Needs better blood pressure control with systolic blood pressure ranging [id number] and diastolic blood pressure ranging 70s.  Code Status: Full. Family Communication: Multiple family updated at bedside 04/08/13. Disposition Plan: Likely home tomorrow.   IV access:  Peripheral IV  Medical Consultants:  None.  Other Consultants:  None.  Anti-infectives:  None.  HPI/Subjective: [name] says his breathing has improved.  He denies chest pain and tightness.  Complains of abdominal bloating and discomfort.   Bowels are moving, no vomiting.  Objective: Filed Vitals:   04/09/13 0009 04/09/13 0541 04/09/13 0700 04/09/13 0910  BP:  125/78 126/79 155/75  Pulse: 47 71 76 71  Temp:  97.5 F (36.4 C)    TempSrc:  Oral    Resp:  18  18  Height:      Weight:  94.2 kg (207 lb 10.8 oz)    SpO2:  95%  95%    Intake/Output Summary (Last 24 hours) at 04/09/13 1045 Last data filed at 04/09/13 0630  Gross per 24 hour  Intake    480 ml  Output    501 ml  Net    -21 ml    Exam: Gen:  NAD Cardiovascular:  RRR, No M/R/G Respiratory:  Lungs CTAB Gastrointestinal:  Abdomen distended, + BS Extremities:  1+ edema  Data Reviewed: Basic Metabolic Panel:  Recent Labs Lab 04/08/13 0258 04/08/13 0447 04/09/13 0550  NA 142  --  141  K 3.5*  --  4.2  CL 100  --  102  CO2 28  --  28  GLUCOSE 104*  --  120*  BUN 20  --  25*  CREATININE 1.07  --  1.07  CALCIUM 8.6  --  9.1  MG  --  1.9  --    GFR Estimated Creatinine Clearance: 80.8 ml/min (by C-G formula based on Cr of 1.07).  CBC:  Recent Labs Lab 04/08/13 0258 04/09/13 0550  WBC 7.1 6.2  NEUTROABS 4.5  --   HGB 12.9* 11.6*  HCT 42.6 39.6  MCV 92.8 93.2  PLT 256 237   Cardiac Enzymes:  Recent Labs Lab 04/08/13 0258 04/08/13 1055 04/08/13 1600  TROPONINI <0.30 <0.30 <0.30   BNP (last 3 results)  Recent Labs  05/06/12 0840 07/11/12 1117 04/08/13 0258  PROBNP 9525.0* 214.0* 6010.0*     Procedures and Diagnostic Studies: Dg Chest Port 1 View  04/08/2013   CLINICAL DATA:  Dyspnea, cough and congestion.  History of smoking.  EXAM: PORTABLE CHEST - 1 VIEW  COMPARISON:  Chest radiograph and CTA of the chest performed 05/06/2012  FINDINGS: The lungs are mildly hypoexpanded. Vascular congestion is noted, with question of mild interstitial edema. No definite pleural effusion or pneumothorax is seen.  The cardiomediastinal silhouette is enlarged, as on the prior study. No acute osseous abnormalities are seen.  IMPRESSION: Lungs  mildly hypoexpanded. Vascular congestion and cardiomegaly, with question of mild interstitial edema.   Electronically Signed   By: Garald Balding M.D.   On: 04/08/2013 03:25    Scheduled Meds: . aspirin  325 mg Oral Daily  . carvedilol  25 mg Oral BID WC  . enoxaparin (LOVENOX) injection  40 mg Subcutaneous Q24H  . furosemide  40 mg Intravenous BID  . hydrALAZINE  25 mg Oral Q6H  . isosorbide dinitrate  10 mg Oral TID  . lisinopril  20 mg Oral BID  . nitroGLYCERIN  1 inch Topical Q6H  . potassium chloride  20 mEq Oral BID  . saccharomyces boulardii  250 mg Oral BID  . simethicone  80 mg Oral QID  . sodium chloride  3 mL Intravenous Q12H   Continuous Infusions:   Time spent: 35 minutes with > 50% of time discussing current diagnostic test results, clinical impression and plan of care.    LOS: 1 day   RAMA,CHRISTINA  [facility] Pager [phone].   *Please note that the hospitalists switch teams on Wednesdays. Please call the flow manager at [phone] if you are having difficulty reaching the hospitalist taking care of this patient as she can update you and provide the most up-to-date pager number of provider caring for the patient. If 8PM-8AM, please contact night-coverage at [email or web address], password [facility]  04/09/2013, 10:45 AM

## 2013-04-10 DIAGNOSIS — R0989 Other specified symptoms and signs involving the circulatory and respiratory systems: Secondary | ICD-10-CM

## 2013-04-10 DIAGNOSIS — I5023 Acute on chronic systolic (congestive) heart failure: Secondary | ICD-10-CM

## 2013-04-10 DIAGNOSIS — R0609 Other forms of dyspnea: Secondary | ICD-10-CM

## 2013-04-10 DIAGNOSIS — I498 Other specified cardiac arrhythmias: Secondary | ICD-10-CM

## 2013-04-10 LAB — BASIC METABOLIC PANEL
BUN: 28 mg/dL — AB (ref 6–23)
CO2: 29 mEq/L (ref 19–32)
Calcium: 9.2 mg/dL (ref 8.4–10.5)
Chloride: 102 mEq/L (ref 96–112)
Creatinine, Ser: 1.08 mg/dL (ref 0.50–1.35)
GFR, EST NON AFRICAN AMERICAN: 83 mL/min — AB (ref >90)
Glucose, Bld: 106 mg/dL — ABNORMAL HIGH (ref 70–99)
POTASSIUM: 4.8 meq/L (ref 3.7–5.3)
Sodium: 142 mEq/L (ref 137–147)

## 2013-04-10 LAB — PRO B NATRIURETIC PEPTIDE: PRO B NATRI PEPTIDE: 1600 pg/mL — AB (ref 0–125)

## 2013-04-10 MED ORDER — FUROSEMIDE 40 MG PO TABS
60.0000 mg | ORAL_TABLET | Freq: Two times a day (BID) | ORAL | Status: AC
  Administered 2013-04-10 – 2013-04-11 (×3): 60 mg via ORAL
  Filled 2013-04-10 (×4): qty 1

## 2013-04-10 MED ORDER — ISOSORBIDE DINITRATE 5 MG PO TABS
5.0000 mg | ORAL_TABLET | Freq: Three times a day (TID) | ORAL | Status: DC
  Administered 2013-04-10 – 2013-04-13 (×10): 5 mg via ORAL
  Filled 2013-04-10 (×15): qty 1

## 2013-04-10 NOTE — Progress Notes (Signed)
Nightshift RN, Carolyn Stare, reports patient's heart rate drops between 20-30 bpm while he is asleep.  RN reports patient having pauses of four seconds while asleep.  Rhythm appears junctional at times and sinus brady at times.  Last vital signs are temp 97.9, pulse is 65, respirations are 24, blood pressure is 116/81, and oxygen saturation is 92% on 2 liters nasal canula.  Patient resting in bed upon entering room, patient easily aroused, and denies any complaints.  Patient on continuous pulse oximeter.  Dr. Rockne Menghini text paged this am to inform her of patient's night and of assessment previously stated in note.  Will continue to monitor patient.

## 2013-04-10 NOTE — Progress Notes (Addendum)
[facility] PROGRESS NOTE   Jerry Edwards [id number] DOB: 11-12-1970 DOA: 04/08/2013 PCP: [name], MD  Brief narrative: Jerry Edwards is an 43 y.o. male with a PMH of osteogenesis imperfecta, hypertension and nonischemic cardiomyopathy/chronic systolic CHF, last 2-D echo done 02/24/2012 with EF 25-30%, diffuse hypokinesis, and normal diastolic parameters who presented to the ED on 04/08/13 with a chief complaint of shortness of breath and chest pain. Initial evaluation in the ED included chest x-ray which showed vascular congestion and cardiomegaly. EKG showed sinus tachycardia. ProBNP was 6010.  Assessment/Plan: Principal Problem:   Systolic CHF, acute on chronic Repeat 2 D Echo with EF 20-25% (prior EF 25-30%). Nitropaste D/C'd and lasix changed to oral route 04/09/13. Currently on Lasix 40 mg PO twice a day (home dose 40 mg daily). I/O balance -925, weight unchanged.  Slightly more dyspneic today.  Will increase lasix to 60 mg BID.  Was placed on Isordil on admission, but should be used with caution in patient's with CHF, so will wean off. Active Problems:   Bradycardia with pauses / ? Junctional rhythm On Coreg.  HR currently 74.  Will ask cardiologist to review current medications and make adjustments.   Abdominal bloating No improvement with simethicone and probiotics.  May be from passive congestion of the liver related to his CHF.   ? OSA/OHS RN reports that patient's oxygen saturations drop when he is sleeping. ? OSA.  Recommend outpatient sleep study.   Chest pain 12-lead EKG nonacute on admission. Continue aspirin, beta blocker. Troponin I negative x 3 sets.   Osteogenesis imperfecta No specific treatment, recommend outpatient surveillance for osteoporosis.   HTN (hypertension), malignant Continue Coreg, hydralazine and lisinopril. Wean Isordil.  Systolic blood pressure ranging from 0000000 and diastolic blood pressure ranging 71-90.  Code Status: Full. Family  Communication: Multiple family updated at bedside 04/08/13. Disposition Plan: Home when stable.   IV access:  Peripheral IV  Medical Consultants:  Cardiology.  Other Consultants:  None.  Anti-infectives:  None.  HPI/Subjective: Jerry Edwards says he still is a bit dyspneic, but improved from admission, though not yet back to baseline.  He denies chest pain.  Continues to report abdominal bloating and discomfort, no change with simethicone and probiotics.  Bowels are moving, no vomiting.  Objective: Filed Vitals:   04/09/13 1816 04/09/13 2136 04/10/13 0542 04/10/13 0700  BP: 117/86 109/71 116/81 116/90  Pulse: 75 74 65 74  Temp:  98 F (36.7 C) 97.9 F (36.6 C) 97.7 F (36.5 C)  TempSrc:  Oral Oral Oral  Resp:  16 24 22   Height:      Weight:   94.303 kg (207 lb 14.4 oz)   SpO2: 90% 91% 90% 90%    Intake/Output Summary (Last 24 hours) at 04/10/13 1009 Last data filed at 04/10/13 0543  Gross per 24 hour  Intake      0 ml  Output    925 ml  Net   -925 ml    Exam: Gen:  NAD Cardiovascular:  RRR, No M/R/G Respiratory:  Lungs CTAB Gastrointestinal:  Abdomen distended, + BS Extremities:  1+ edema  Data Reviewed: Basic Metabolic Panel:  Recent Labs Lab 04/08/13 0258 04/08/13 0447 04/09/13 0550 04/10/13 0527  NA 142  --  141 142  K 3.5*  --  4.2 4.8  CL 100  --  102 102  CO2 28  --  28 29  GLUCOSE 104*  --  120* 106*  BUN 20  --  25*  28*  CREATININE 1.07  --  1.07 1.08  CALCIUM 8.6  --  9.1 9.2  MG  --  1.9  --   --    GFR Estimated Creatinine Clearance: 80.2 ml/min (by C-G formula based on Cr of 1.08).  CBC:  Recent Labs Lab 04/08/13 0258 04/09/13 0550  WBC 7.1 6.2  NEUTROABS 4.5  --   HGB 12.9* 11.6*  HCT 42.6 39.6  MCV 92.8 93.2  PLT 256 237   Cardiac Enzymes:  Recent Labs Lab 04/08/13 0258 04/08/13 1055 04/08/13 1600  TROPONINI <0.30 <0.30 <0.30   BNP (last 3 results)  Recent Labs  07/11/12 1117 04/08/13 0258  04/10/13 0527  PROBNP 214.0* 6010.0* 1600.0*     Procedures and Diagnostic Studies: Dg Chest Port 1 View  04/08/2013   CLINICAL DATA:  Dyspnea, cough and congestion.  History of smoking.  EXAM: PORTABLE CHEST - 1 VIEW  COMPARISON:  Chest radiograph and CTA of the chest performed 05/06/2012  FINDINGS: The lungs are mildly hypoexpanded. Vascular congestion is noted, with question of mild interstitial edema. No definite pleural effusion or pneumothorax is seen.  The cardiomediastinal silhouette is enlarged, as on the prior study. No acute osseous abnormalities are seen.  IMPRESSION: Lungs mildly hypoexpanded. Vascular congestion and cardiomegaly, with question of mild interstitial edema.   Electronically Signed   By: Garald Balding M.D.   On: 04/08/2013 03:25    Scheduled Meds: . aspirin  325 mg Oral Daily  . carvedilol  25 mg Oral BID WC  . enoxaparin (LOVENOX) injection  40 mg Subcutaneous Q24H  . furosemide  40 mg Oral BID  . hydrALAZINE  25 mg Oral Q6H  . isosorbide dinitrate  10 mg Oral TID  . lisinopril  20 mg Oral BID  . potassium chloride  20 mEq Oral BID  . saccharomyces boulardii  250 mg Oral BID  . simethicone  80 mg Oral QID  . sodium chloride  3 mL Intravenous Q12H   Continuous Infusions:   Time spent: 35 minutes with > 50% of time discussing current diagnostic test results, clinical impression and plan of care.    LOS: 2 days   Jerry Edwards  [facility] Pager [phone].   *Please note that the hospitalists switch teams on Wednesdays. Please call the flow manager at [phone] if you are having difficulty reaching the hospitalist taking care of this patient as she can update you and provide the most up-to-date pager number of provider caring for the patient. If 8PM-8AM, please contact night-coverage at [email or web address], password [facility]  04/10/2013, 10:09 AM

## 2013-04-10 NOTE — Progress Notes (Signed)
Patient continued to have periods of bradycardia during the night when sleeping.  HR down to high 20's-30's when he is asleep.  Patient easily arouses and has no complaints but continues to drop down and cause patient to have significant pauses.  Longest pause over night was 4.04 secs.  Will continue to monitor patient.
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
## 2013-04-11 LAB — BASIC METABOLIC PANEL
BUN: 26 mg/dL — ABNORMAL HIGH (ref 6–23)
CALCIUM: 9.3 mg/dL (ref 8.4–10.5)
CO2: 30 mEq/L (ref 19–32)
Chloride: 103 mEq/L (ref 96–112)
Creatinine, Ser: 1.07 mg/dL (ref 0.50–1.35)
GFR calc non Af Amer: 84 mL/min — ABNORMAL LOW (ref >90)
Glucose, Bld: 92 mg/dL (ref 70–99)
Potassium: 4.4 mEq/L (ref 3.7–5.3)
SODIUM: 143 meq/L (ref 137–147)

## 2013-04-11 MED ORDER — SODIUM CHLORIDE 0.9 % IV SOLN: 250.0000 mL | INTRAVENOUS | Status: DC | PRN

## 2013-04-11 MED ORDER — SODIUM CHLORIDE 0.9 % IJ SOLN
3.0000 mL | Freq: Two times a day (BID) | INTRAMUSCULAR | Status: DC
  Administered 2013-04-11 – 2013-04-12 (×2): 3 mL via INTRAVENOUS

## 2013-04-11 MED ORDER — SODIUM CHLORIDE 0.9 % IJ SOLN: 3.0000 mL | INTRAMUSCULAR | Status: DC | PRN

## 2013-04-11 MED ORDER — DIAZEPAM 2 MG PO TABS
2.0000 mg | ORAL_TABLET | ORAL | Status: AC
  Administered 2013-04-12: 2 mg via ORAL
  Filled 2013-04-11: qty 1

## 2013-04-11 MED ORDER — SODIUM CHLORIDE 0.9 % IV SOLN
INTRAVENOUS | Status: DC
  Administered 2013-04-12: 06:00:00 via INTRAVENOUS

## 2013-04-11 MED ORDER — ASPIRIN 81 MG PO CHEW
81.0000 mg | CHEWABLE_TABLET | ORAL | Status: AC
  Administered 2013-04-12: 81 mg via ORAL
  Filled 2013-04-11: qty 1

## 2013-04-11 NOTE — Progress Notes (Signed)
Patient had run of SVT. Heart rate reached around 150 bpm and did not sustain. Patient asleep during episode.  Dr. Rockne Menghini notified via text page.  Will continue to monitor patient.

## 2013-04-11 NOTE — Progress Notes (Signed)
[facility] PROGRESS NOTE   Jerry Edwards [id number] DOB: [birth year] DOA: 04/08/2013 PCP: [name], MD  Brief narrative: Jerry Edwards is an 43 y.o. male with a PMH of osteogenesis imperfecta, hypertension and nonischemic cardiomyopathy/chronic systolic CHF, last 2-D echo done 02/24/2012 with EF 25-30%, diffuse hypokinesis, and normal diastolic parameters who presented to the ED on 04/08/13 with a chief complaint of shortness of breath and chest pain. Initial evaluation in the ED included chest x-ray which showed vascular congestion and cardiomegaly. EKG showed sinus tachycardia. ProBNP was 6010.  Assessment/Plan: Principal Problem:   Systolic CHF, acute on chronic Repeat 2 D Echo with EF 20-25% (prior EF 25-30%). Nitropaste D/C'd and lasix changed to oral route 04/09/13. Currently on Lasix 60 mg PO twice a day (home dose 40 mg daily). I/O balance -1.2L, but weight up.  Dyspnea improved.  Renal function stable.  F/U TSH.  Hold a.m. Dose of Lasix tomorrow in anticipation of cath.  Will need to be resumed post-procedure. Active Problems:   Bradycardia with pauses / ? Junctional rhythm On Coreg.  HR 68-90.  Cardiologist following.   Abdominal bloating No improvement with simethicone and probiotics so will D/C.  May be from passive congestion of the liver related to his CHF.   ? OSA/OHS RN reports that patient's oxygen saturations drop when he is sleeping. ? OSA.  Recommend outpatient sleep study.   Chest pain 12-lead EKG nonacute on admission. Continue aspirin, beta blocker. Troponin I negative x 3 sets.  For cardiac catheterization.   Osteogenesis imperfecta No specific treatment, recommend outpatient surveillance for osteoporosis.   HTN (hypertension), malignant Continue Coreg, hydralazine and lisinopril and Isordil.  Systolic blood pressure ranging from [id number] and diastolic blood pressure ranging 65-83.  Code Status: Full. Family Communication: Multiple family updated at  bedside 04/08/13. Disposition Plan: Home when stable.   IV access:  Peripheral IV  Medical Consultants:  Cardiology.  Other Consultants:  None.  Anti-infectives:  None.  HPI/Subjective: Ponce Gamarra feels like his breathing is back to baseline.  He denies chest pain.  Bowels are moving, no vomiting, still with some abdominal distention.  Objective: Filed Vitals:   04/10/13 0926 04/10/13 1347 04/10/13 2201 04/11/13 0601  BP: 111/65 109/83 130/69 122/72  Pulse: 68 90 73 68  Temp: 97.6 F (36.4 C) 98.5 F (36.9 C) 98.6 F (37 C) 98.1 F (36.7 C)  TempSrc: Oral Oral Oral Oral  Resp: 22 18 18 18   Height:      Weight:    95.029 kg (209 lb 8 oz)  SpO2: 90% 92% 97% 95%    Intake/Output Summary (Last 24 hours) at 04/11/13 1216 Last data filed at 04/11/13 1100  Gross per 24 hour  Intake    240 ml  Output   1475 ml  Net  -1235 ml    Exam: Gen:  NAD Cardiovascular:  RRR, No M/R/G Respiratory:  Lungs CTAB Gastrointestinal:  Abdomen distended, + BS Extremities:  1+ edema  Data Reviewed: Basic Metabolic Panel:  Recent Labs Lab 04/08/13 0258 04/08/13 0447 04/09/13 0550 04/10/13 0527 04/11/13 0443  NA 142  --  141 142 143  K 3.5*  --  4.2 4.8 4.4  CL 100  --  102 102 103  CO2 28  --  28 29 30   GLUCOSE 104*  --  120* 106* 92  BUN 20  --  25* 28* 26*  CREATININE 1.07  --  1.07 1.08 1.07  CALCIUM 8.6  --  9.1 9.2  9.3  MG  --  1.9  --   --   --    GFR Estimated Creatinine Clearance: 81.3 ml/min (by C-G formula based on Cr of 1.07).  CBC:  Recent Labs Lab 04/08/13 0258 04/09/13 0550  WBC 7.1 6.2  NEUTROABS 4.5  --   HGB 12.9* 11.6*  HCT 42.6 39.6  MCV 92.8 93.2  PLT 256 237   Cardiac Enzymes:  Recent Labs Lab 04/08/13 0258 04/08/13 1055 04/08/13 1600  TROPONINI <0.30 <0.30 <0.30   BNP (last 3 results)  Recent Labs  07/11/12 1117 04/08/13 0258 04/10/13 0527  PROBNP 214.0* 6010.0* 1600.0*     Procedures and Diagnostic Studies: Dg  Chest Port 1 View  04/08/2013   CLINICAL DATA:  Dyspnea, cough and congestion.  History of smoking.  EXAM: PORTABLE CHEST - 1 VIEW  COMPARISON:  Chest radiograph and CTA of the chest performed 05/06/2012  FINDINGS: The lungs are mildly hypoexpanded. Vascular congestion is noted, with question of mild interstitial edema. No definite pleural effusion or pneumothorax is seen.  The cardiomediastinal silhouette is enlarged, as on the prior study. No acute osseous abnormalities are seen.  IMPRESSION: Lungs mildly hypoexpanded. Vascular congestion and cardiomegaly, with question of mild interstitial edema.   Electronically Signed   By: Garald Balding M.D.   On: 04/08/2013 03:25    Scheduled Meds: . aspirin  325 mg Oral Daily  . carvedilol  25 mg Oral BID WC  . enoxaparin (LOVENOX) injection  40 mg Subcutaneous Q24H  . furosemide  60 mg Oral BID  . hydrALAZINE  25 mg Oral Q6H  . isosorbide dinitrate  5 mg Oral TID  . lisinopril  20 mg Oral BID  . potassium chloride  20 mEq Oral BID  . saccharomyces boulardii  250 mg Oral BID  . simethicone  80 mg Oral QID  . sodium chloride  3 mL Intravenous Q12H   Continuous Infusions:   Time spent: 25 minutes.    LOS: 3 days   [facility] Pager [phone].   *Please note that the hospitalists switch teams on Wednesdays. Please call the flow manager at [phone] if you are having difficulty reaching the hospitalist taking care of this patient as she can update you and provide the most up-to-date pager number of provider caring for the patient. If 8PM-8AM, please contact night-coverage at [email or web address], password [email or web address]  04/11/2013, 12:16 PM

## 2013-04-11 NOTE — Progress Notes (Addendum)
    Subjective:  Denies CP; dyspnea improving   Objective:  Filed Vitals:   04/10/13 0926 04/10/13 1347 04/10/13 2201 04/11/13 0601  BP: 111/65 109/83 130/69 122/72  Pulse: 68 90 73 68  Temp: 97.6 F (36.4 C) 98.5 F (36.9 C) 98.6 F (37 C) 98.1 F (36.7 C)  TempSrc: Oral Oral Oral Oral  Resp: 22 18 18 18   Height:      Weight:    209 lb 8 oz (95.029 kg)  SpO2: 90% 92% 97% 95%    Intake/Output from previous day:  Intake/Output Summary (Last 24 hours) at 04/11/13 0914 Last data filed at 04/11/13 0600  Gross per 24 hour  Intake    480 ml  Output   1550 ml  Net  -1070 ml    Physical Exam: Physical exam: Osteogenesis imperfecta; well-nourished in no acute distress.  Skin is warm and dry.  HEENT is normal.  Neck is supple.  Chest is clear to auscultation with normal expansion.  Cardiovascular exam is regular rate and rhythm.  Abdominal exam nontender or distended. No masses palpated. Extremities show 1+ edema. neuro grossly intact    Lab Results: Basic Metabolic Panel:  Recent Labs  04/10/13 0527 04/11/13 0443  NA 142 143  K 4.8 4.4  CL 102 103  CO2 29 30  GLUCOSE 106* 92  BUN 28* 26*  CREATININE 1.08 1.07  CALCIUM 9.2 9.3   CBC:  Recent Labs  04/09/13 0550  WBC 6.2  HGB 11.6*  HCT 39.6  MCV 93.2  PLT 237   Cardiac Enzymes:  Recent Labs  04/08/13 1055 04/08/13 1600  TROPONINI <0.30 <0.30     Assessment/Plan:  1 acute on chronic systolic congestive heart failure-patient's symptoms are improving. Continue diuresis and follow renal function.  2 cardiomyopathy-felt to be hypertensive previously. Plan right and left cardiac catheterization tomorrow to exclude coronary disease. The risks and benefits were discussed and the patient agrees to proceed. Hold Lasix prior to the procedure. Check TSH. Continue beta blocker, ACE inhibitor and hydralazine/nitrate. 3 hypertension-blood pressure is controlled. Continue present medications. 4.  bradycardia-patient noted to have significant bradycardia on telemetry while asleep. This is most likely related to sleep apnea. Needs evaluation following discharge.  Kirk Ruths 04/11/2013, 9:14 AM

## 2013-04-12 ENCOUNTER — Encounter (HOSPITAL_COMMUNITY)
Admission: EM | Disposition: A | Discharge status: Home / Self Care | Payer: Self-pay | Source: Home / Self Care | Attending: Internal Medicine

## 2013-04-12 DIAGNOSIS — Q780 Osteogenesis imperfecta: Secondary | ICD-10-CM

## 2013-04-12 DIAGNOSIS — I4729 Other ventricular tachycardia: Secondary | ICD-10-CM

## 2013-04-12 DIAGNOSIS — I428 Other cardiomyopathies: Secondary | ICD-10-CM

## 2013-04-12 DIAGNOSIS — I10 Essential (primary) hypertension: Secondary | ICD-10-CM

## 2013-04-12 DIAGNOSIS — I472 Ventricular tachycardia: Secondary | ICD-10-CM

## 2013-04-12 DIAGNOSIS — I509 Heart failure, unspecified: Secondary | ICD-10-CM

## 2013-04-12 HISTORY — PX: LEFT AND RIGHT HEART CATHETERIZATION WITH CORONARY ANGIOGRAM: SHX5449

## 2013-04-12 LAB — POCT I-STAT 3, ART BLOOD GAS (G3+)
Acid-Base Excess: 5 mmol/L — ABNORMAL HIGH (ref 0.0–2.0)
Bicarbonate: 31.8 mEq/L — ABNORMAL HIGH (ref 20.0–24.0)
O2 SAT: 97 %
PCO2 ART: 53.4 mmHg — AB (ref 35.0–45.0)
PH ART: 7.382 (ref 7.350–7.450)
PO2 ART: 98 mmHg (ref 80.0–100.0)
TCO2: 33 mmol/L (ref 0–100)

## 2013-04-12 LAB — CBC
HCT: 41.6 % (ref 39.0–52.0)
HCT: 42.4 % (ref 39.0–52.0)
Hemoglobin: 12 g/dL — ABNORMAL LOW (ref 13.0–17.0)
Hemoglobin: 12.5 g/dL — ABNORMAL LOW (ref 13.0–17.0)
MCH: 27.3 pg (ref 26.0–34.0)
MCH: 28.6 pg (ref 26.0–34.0)
MCHC: 28.8 g/dL — ABNORMAL LOW (ref 30.0–36.0)
MCHC: 29.5 g/dL — AB (ref 30.0–36.0)
MCV: 94.5 fL (ref 78.0–100.0)
MCV: 97 fL (ref 78.0–100.0)
PLATELETS: 288 10*3/uL (ref 150–400)
Platelets: 238 10*3/uL (ref 150–400)
RBC: 4.4 MIL/uL (ref 4.22–5.81)
RBC: 4.37 MIL/uL (ref 4.22–5.81)
RDW: 14 % (ref 11.5–15.5)
RDW: 14.2 % (ref 11.5–15.5)
WBC: 5.7 10*3/uL (ref 4.0–10.5)
WBC: 4.2 10*3/uL (ref 4.0–10.5)

## 2013-04-12 LAB — BASIC METABOLIC PANEL
BUN: 24 mg/dL — ABNORMAL HIGH (ref 6–23)
CO2: 32 mEq/L (ref 19–32)
Calcium: 9.3 mg/dL (ref 8.4–10.5)
Chloride: 102 mEq/L (ref 96–112)
Creatinine, Ser: 1 mg/dL (ref 0.50–1.35)
Glucose, Bld: 86 mg/dL (ref 70–99)
POTASSIUM: 4.7 meq/L (ref 3.7–5.3)
SODIUM: 143 meq/L (ref 137–147)

## 2013-04-12 LAB — POCT I-STAT 3, VENOUS BLOOD GAS (G3P V)
ACID-BASE EXCESS: 7 mmol/L — AB (ref 0.0–2.0)
BICARBONATE: 34.9 meq/L — AB (ref 20.0–24.0)
O2 Saturation: 66 %
PH VEN: 7.324 — AB (ref 7.250–7.300)
TCO2: 37 mmol/L (ref 0–100)
pCO2, Ven: 67.1 mmHg — ABNORMAL HIGH (ref 45.0–50.0)
pO2, Ven: 38 mmHg (ref 30.0–45.0)

## 2013-04-12 LAB — CREATININE, SERUM
CREATININE: 0.91 mg/dL (ref 0.50–1.35)
GFR calc Af Amer: >90 mL/min (ref >90)

## 2013-04-12 LAB — TSH: TSH: 0.607 u[IU]/mL (ref 0.350–4.500)

## 2013-04-12 SURGERY — LEFT AND RIGHT HEART CATHETERIZATION WITH CORONARY ANGIOGRAM
Anesthesia: LOCAL

## 2013-04-12 MED ORDER — HYDROCODONE-ACETAMINOPHEN 5-325 MG PO TABS
1.0000 | ORAL_TABLET | Freq: Four times a day (QID) | ORAL | Status: DC | PRN
  Administered 2013-04-18 (×2): 1 via ORAL
  Filled 2013-04-12 (×2): qty 1

## 2013-04-12 MED ORDER — SODIUM CHLORIDE 0.9 % IV SOLN
INTRAVENOUS | Status: AC
Start: 2013-04-12 — End: 2013-04-12

## 2013-04-12 MED ORDER — HEPARIN (PORCINE) IN NACL 2-0.9 UNIT/ML-% IJ SOLN
INTRAMUSCULAR | Status: AC
  Filled 2013-04-12: qty 1000

## 2013-04-12 MED ORDER — FUROSEMIDE 10 MG/ML IJ SOLN
40.0000 mg | Freq: Two times a day (BID) | INTRAMUSCULAR | Status: DC
  Administered 2013-04-12 – 2013-04-14 (×4): 40 mg via INTRAVENOUS
  Filled 2013-04-12 (×6): qty 4

## 2013-04-12 MED ORDER — NITROGLYCERIN 0.2 MG/ML ON CALL CATH LAB
INTRAVENOUS | Status: AC
  Filled 2013-04-12: qty 1

## 2013-04-12 MED ORDER — FENTANYL CITRATE 0.05 MG/ML IJ SOLN
INTRAMUSCULAR | Status: AC
  Filled 2013-04-12: qty 2

## 2013-04-12 MED ORDER — LIDOCAINE HCL (PF) 1 % IJ SOLN
INTRAMUSCULAR | Status: AC
Start: 2013-04-12 — End: 2013-04-12
  Filled 2013-04-12: qty 30

## 2013-04-12 MED ORDER — FUROSEMIDE 40 MG PO TABS: 40.0000 mg | ORAL_TABLET | Freq: Every day | ORAL | Status: DC

## 2013-04-12 MED ORDER — MIDAZOLAM HCL 2 MG/2ML IJ SOLN
INTRAMUSCULAR | Status: AC
  Filled 2013-04-12: qty 2

## 2013-04-12 MED ORDER — FUROSEMIDE 40 MG PO TABS
60.0000 mg | ORAL_TABLET | Freq: Two times a day (BID) | ORAL | Status: DC
  Filled 2013-04-12 (×4): qty 1

## 2013-04-12 NOTE — H&P (View-Only) (Signed)
    Subjective:  Denies CP; dyspnea improving   Objective:  Filed Vitals:   04/11/13 2056 04/11/13 2347 04/12/13 0517 04/12/13 0547  BP: 127/66 133/73 152/79   Pulse: 76  77   Temp: 98.4 F (36.9 C)  98.6 F (37 C)   TempSrc: Oral  Oral   Resp: 20  12   Height:      Weight:    209 lb 14.1 oz (95.2 kg)  SpO2: 99%  98%     Intake/Output from previous day:  Intake/Output Summary (Last 24 hours) at 04/12/13 0711 Last data filed at 04/12/13 [id number]  Gross per 24 hour  Intake    600 ml  Output   2051 ml  Net  -1451 ml    Physical Exam: Physical exam: Osteogenesis imperfecta; well-nourished in no acute distress.  Skin is warm and dry.  HEENT is normal.  Neck is supple.  Chest is clear to auscultation with normal expansion.  Cardiovascular exam is regular rate and rhythm.  Abdominal exam nontender or distended. No masses palpated. Extremities show 1+ edema. neuro grossly intact    Lab Results: Basic Metabolic Panel:  Recent Labs  04/11/13 0443 04/12/13 0424  NA 143 143  K 4.4 4.7  CL 103 102  CO2 30 32  GLUCOSE 92 86  BUN 26* 24*  CREATININE 1.07 1.00  CALCIUM 9.3 9.3   CBC:  Recent Labs  04/12/13 0424  WBC 5.7  HGB 12.0*  HCT 41.6  MCV 94.5  PLT 288     Assessment/Plan:  1 acute on chronic systolic congestive heart failure-patient's symptoms are improving. Continue diuresis and follow renal function. Will most likely change lasix to 40 mg daily and add spironolactone at DC. 2 cardiomyopathy-felt to be hypertensive previously. Plan right and left cardiac catheterization to exclude coronary disease. The risks and benefits were discussed and the patient agrees to proceed. Hold Lasix prior to the procedure. TSH pending. Continue beta blocker, ACE inhibitor and hydralazine/nitrates. If no CAD, will need EP eval as outpt for consideration of ICD. 3 hypertension-blood pressure is controlled. Continue present medications. 4. bradycardia-patient noted to  have significant bradycardia on telemetry while asleep. This is most likely related to sleep apnea. Needs evaluation following discharge. 5. NSVT - noted on telemetry; continue beta blocker; EP eval as outpt for ICD.  Kirk Ruths 04/12/2013, 7:11 AM

## 2013-04-12 NOTE — Progress Notes (Signed)
[facility] PROGRESS NOTE  Jerry Edwards [id number] DOB: 09/22/1970 DOA: 04/08/2013 PCP: [name], MD  Assessment/Plan: Systolic CHF, acute on chronic  Repeat 2 D Echo with EF 20-25% (prior EF 25-30%).  -Nitropaste D/C'd and lasix changed to oral route 04/09/13. Currently on Lasix 60 mg PO twice a day (home dose 40 mg daily). -Dyspnea improved. Renal function stable.  -F/U TSH--0.607  -Held a.m. Dose of Lasix  in anticipation of cath. Will need to be resumed post-procedure.  -neg 3.5 liters for the admission -appreciate cardiology follow up Active Problems:  Bradycardia with pauses / ? Junctional rhythm  On Coreg. HR 68-90. Cardiologist following.  Abdominal bloating  No improvement with simethicone and probiotics so will D/C. May be from passive congestion of the liver related to his CHF.  ? OSA/OHS  RN reports that patient's oxygen saturations drop when he is sleeping. ? OSA.  -had apneic episodes during cath -Recommend outpatient sleep study.  Chest pain  12-lead EKG nonacute on admission. Continue aspirin, beta blocker. Troponin I negative x 3 sets. -04/12/13 heart cath--no significant CAD  Osteogenesis imperfecta  No specific treatment, recommend outpatient surveillance for osteoporosis.  HTN (hypertension), malignant with HTN heart disease Continue Coreg, hydralazine and lisinopril and Isordil. Systolic blood pressure ranging from [id number] and diastolic blood pressure ranging 65-83.  Family Communication:   mother at beside Disposition Plan:   Home when medically stable       Procedures/Studies: Dg Chest Port 1 View  04/08/2013   CLINICAL DATA:  Dyspnea, cough and congestion.  History of smoking.  EXAM: PORTABLE CHEST - 1 VIEW  COMPARISON:  Chest radiograph and CTA of the chest performed 05/06/2012  FINDINGS: The lungs are mildly hypoexpanded. Vascular congestion is noted, with question of mild interstitial edema. No definite pleural effusion or  pneumothorax is seen.  The cardiomediastinal silhouette is enlarged, as on the prior study. No acute osseous abnormalities are seen.  IMPRESSION: Lungs mildly hypoexpanded. Vascular congestion and cardiomegaly, with question of mild interstitial edema.   Electronically Signed   By: Garald Balding M.D.   On: 04/08/2013 03:25         Subjective: Patient continues to have some mild dyspnea but much improved. Denies any chest discomfort, nausea, vomiting, diarrhea, abdominal pain.  Objective: Filed Vitals:   04/12/13 0517 04/12/13 0547 04/12/13 1329 04/12/13 1540  BP: 152/79   128/75  Pulse: 77  68 62  Temp: 98.6 F (37 C)   98.2 F (36.8 C)  TempSrc: Oral   Oral  Resp: 12   18  Height:      Weight:  95.2 kg (209 lb 14.1 oz)    SpO2: 98%   100%    Intake/Output Summary (Last 24 hours) at 04/12/13 1713 Last data filed at 04/12/13 1619  Gross per 24 hour  Intake    860 ml  Output   1650 ml  Net   -790 ml   Weight change: 0.172 kg (6.1 oz) Exam:   General:  Pt is alert, follows commands appropriately, not in acute distress  HEENT: No icterus, No thrush,  [place]/AT  Cardiovascular: RRR, S1/S2, no rubs, no gallops  Respiratory: Basilar crackles. No wheezing. Good air movement.  Abdomen: Soft/+BS, non tender, non distended, no guarding  Extremities: 2+ LE edema, No lymphangitis, No petechiae, No rashes, no synovitis  Data Reviewed: Basic Metabolic Panel:  Recent Labs Lab 04/08/13 0258 04/08/13 0447 04/09/13 0550 04/10/13 0527 04/11/13 0443 04/12/13 0424  NA 142  --  141 142 143 143  K 3.5*  --  4.2 4.8 4.4 4.7  CL 100  --  102 102 103 102  CO2 28  --  28 29 30  32  GLUCOSE 104*  --  120* 106* 92 86  BUN 20  --  25* 28* 26* 24*  CREATININE 1.07  --  1.07 1.08 1.07 1.00  CALCIUM 8.6  --  9.1 9.2 9.3 9.3  MG  --  1.9  --   --   --   --    Liver Function Tests:  Recent Labs Lab 04/09/13 0550  AST 34  ALT 29  ALKPHOS 65  BILITOT 0.6  PROT 6.9  ALBUMIN 2.8*    No results found for this basename: LIPASE, AMYLASE,  in the last 168 hours No results found for this basename: AMMONIA,  in the last 168 hours CBC:  Recent Labs Lab 04/08/13 0258 04/09/13 0550 04/12/13 0424  WBC 7.1 6.2 5.7  NEUTROABS 4.5  --   --   HGB 12.9* 11.6* 12.0*  HCT 42.6 39.6 41.6  MCV 92.8 93.2 94.5  PLT 256 237 288   Cardiac Enzymes:  Recent Labs Lab 04/08/13 0258 04/08/13 1055 04/08/13 1600  TROPONINI <0.30 <0.30 <0.30   BNP: No components found with this basename: POCBNP,  CBG: No results found for this basename: GLUCAP,  in the last 168 hours  No results found for this or any previous visit (from the past 240 hour(s)).   Scheduled Meds: . aspirin  325 mg Oral Daily  . carvedilol  25 mg Oral BID WC  . enoxaparin (LOVENOX) injection  40 mg Subcutaneous Q24H  . hydrALAZINE  25 mg Oral Q6H  . isosorbide dinitrate  5 mg Oral TID  . lisinopril  20 mg Oral BID  . potassium chloride  20 mEq Oral BID  . sodium chloride  3 mL Intravenous Q12H   Continuous Infusions: . sodium chloride       Evvie Behrmann, DO  [facility] Pager [phone]  If 7PM-7AM, please contact night-coverage [email or web address] Password [email or web address] 04/12/2013, 5:13 PM   LOS: 4 days

## 2013-04-12 NOTE — Interval H&P Note (Signed)
Cath Lab Visit (complete for each Cath Lab visit)  Clinical Evaluation Leading to the Procedure:   ACS: no  Non-ACS:    Anginal Classification: CCS III  Anti-ischemic medical therapy: Maximal Therapy (2 or more classes of medications)  Non-Invasive Test Results: No non-invasive testing performed  Prior CABG: No previous CABG      History and Physical Interval Note:  04/12/2013 1:11 PM  Jerry Edwards  has presented today for surgery, with the diagnosis of hf  The various methods of treatment have been discussed with the patient and family. After consideration of risks, benefits and other options for treatment, the patient has consented to  Procedure(s): LEFT AND RIGHT HEART CATHETERIZATION WITH CORONARY ANGIOGRAM (N/A) as a surgical intervention .  The patient's history has been reviewed, patient examined, no change in status, stable for surgery.  I have reviewed the patient's chart and labs.  Questions were answered to the patient's satisfaction.     Jerry Edwards

## 2013-04-12 NOTE — Progress Notes (Signed)
Patient seen and examined after cardiac cath .  transferred from [facility] for procedure.  vitals stable. C/o pain over csth site. Appears clean. No bleeding.  continue diuretics. Monitor I/O

## 2013-04-12 NOTE — Progress Notes (Signed)
CCMD notified this RN that pt had 15 beats of V.tach around 04:49am.  HR jumped up to 115 but did not sustain. When looking at the strip it appears that they are wide QRS complexes and not beats of V.tach.  Upon assessment, pt sleeping in bed at this time.  Woke the pt up to take vitals signs and pt denies any chest pain or complaints at this time. Notified NP on call, K. Schorr and received orders to monitor pt for now and call back if pt becomes symptomatic.  Will continue to monitor.

## 2013-04-12 NOTE — Progress Notes (Signed)
    Subjective:  Denies CP; dyspnea improving   Objective:  Filed Vitals:   04/11/13 2056 04/11/13 2347 04/12/13 0517 04/12/13 0547  BP: 127/66 133/73 152/79   Pulse: 76  77   Temp: 98.4 F (36.9 C)  98.6 F (37 C)   TempSrc: Oral  Oral   Resp: 20  12   Height:      Weight:    209 lb 14.1 oz (95.2 kg)  SpO2: 99%  98%     Intake/Output from previous day:  Intake/Output Summary (Last 24 hours) at 04/12/13 0711 Last data filed at 04/12/13 [id number]  Gross per 24 hour  Intake    600 ml  Output   2051 ml  Net  -1451 ml    Physical Exam: Physical exam: Osteogenesis imperfecta; well-nourished in no acute distress.  Skin is warm and dry.  HEENT is normal.  Neck is supple.  Chest is clear to auscultation with normal expansion.  Cardiovascular exam is regular rate and rhythm.  Abdominal exam nontender or distended. No masses palpated. Extremities show 1+ edema. neuro grossly intact    Lab Results: Basic Metabolic Panel:  Recent Labs  04/11/13 0443 04/12/13 0424  NA 143 143  K 4.4 4.7  CL 103 102  CO2 30 32  GLUCOSE 92 86  BUN 26* 24*  CREATININE 1.07 1.00  CALCIUM 9.3 9.3   CBC:  Recent Labs  04/12/13 0424  WBC 5.7  HGB 12.0*  HCT 41.6  MCV 94.5  PLT 288     Assessment/Plan:  1 acute on chronic systolic congestive heart failure-patient's symptoms are improving. Continue diuresis and follow renal function. Will most likely change lasix to 40 mg daily and add spironolactone at DC. 2 cardiomyopathy-felt to be hypertensive previously. Plan right and left cardiac catheterization to exclude coronary disease. The risks and benefits were discussed and the patient agrees to proceed. Hold Lasix prior to the procedure. TSH pending. Continue beta blocker, ACE inhibitor and hydralazine/nitrates. If no CAD, will need EP eval as outpt for consideration of ICD. 3 hypertension-blood pressure is controlled. Continue present medications. 4. bradycardia-patient noted to  have significant bradycardia on telemetry while asleep. This is most likely related to sleep apnea. Needs evaluation following discharge. 5. NSVT - noted on telemetry; continue beta blocker; EP eval as outpt for ICD.  Kirk Ruths 04/12/2013, 7:11 AM

## 2013-04-12 NOTE — CV Procedure (Addendum)
Cardiac Catheterization Procedure Note  Name: Jerry Edwards MRN: [id number] DOB: 07-16-1970  Procedure: Right and Left Heart Cath via the right arm, Selective Coronary Angiography.   Indication: Congestive heart failure severe cardiomyopathy.  Medications:  Sedation:  2 mg IV Versed, 75 mcg IV Fentanyl  Contrast:  70 Omnipaque   Procedural Details: The pre-existing Angiocath in the right antecubital vein was exchanged under sterile fashion into a 6 French slender sheath. Right heart catheterization was performed with a 5 French Swan-Ganz catheter. There was difficulty in engaging the pulmonary artery and thus I used the 0.025 wire. Cardiac output was calculated by the Fick method. The right wrist was prepped, draped, and anesthetized with 1% lidocaine. Using the modified Seldinger technique, a 5 French sheath was introduced into the right radial artery. 3 mg of verapamil was administered through the sheath, weight-based unfractionated heparin was administered intravenously. A Jackie catheter could not engage the coronary arteries due to significant tortuosities in the innominate artery. Standard Judkins catheters were used. A pigtail catheter was used for left ventricular pressure. Ventricular gram was not performed.  Catheter exchanges were performed over an exchange length guidewire. There were no immediate procedural complications. A TR band was used for radial hemostasis at the completion of the procedure.  The patient was transferred to the post catheterization recovery area for further monitoring.  Procedural Findings:  Right heart catheterization:  RA pressure: Severely elevated at 39/39 with a mean of 34. Prominent X. and Y. Descends. RV pressure: 76/26 with a right ventricular end-diastolic pressure of 41 mm mercury PA pressure: 70/36 with a mean of 51 Pulmonary capillary wedge pressure: 31/30 and mean of 29 LV pressure: 140/26 with a left ventricular end-diastolic pressure of  35 PA sat 66% AO sat 97% Cardiac output: 4.94 with a cardiac index of 2.63 Pulmonary vascular resistance: 4.45 Woods units  Coronary angiography: Coronary dominance:  Right   Left Main:  Normal  Left Anterior Descending (LAD):  Normal in size with minor irregularities. There is 20% proximal stenosis.  1st diagonal (D1):  No significant disease.  2nd diagonal (D2):  No significant disease.  3rd diagonal (D3):  No significant disease.  Circumflex (LCx):  Large in size and codominant. The vessel has minor irregularities.  1st obtuse marginal:  Small in size with no significant disease.  2nd obtuse marginal:  Large in size with no significant disease.  3rd obtuse marginal:  Large in size with no significant disease.   AV groove continuation segment:  Large in size with minor irregularities. It gives 2 posterolateral branches which are free of significant disease.  Right Coronary Artery:  Normal in size and codominant. The vessel has minor irregularities with no obstructive disease.  Posterior descending artery:  Normal in size with minor irregularities.  Left ventriculography:  was not performed. EF is known to be severely reduced by echo.  Final Conclusions:   1. No significant coronary artery disease.  2. Severely elevated filling pressures with evidence of severe pulmonary hypertension. 3. Near equalization of end-diastolic pressures with prominent X. and Y. Descents.   Recommendations:  The patient has evidence of restrictive physiology (less likely to be constrictive with this degree of pulmonary hypertension). This could be just a manifestation of advanced diastolic heart failure. However, infiltrative cardiomyopathy should be in the differential diagnosis. Consider cardiac MRI if there is no contraindication. Resume diuresis with IV furosemide.  Evaluate for sleep apnea. The patient was noted to have apnea episodes during cath.  [name] MD, [facility] 04/12/2013,  2:42 PM

## 2013-04-12 NOTE — Progress Notes (Signed)
Reviewed plan of care with Dr. Flonnie Overman Dhungel and instructed to infuse NS as instructed in post cath orders and give 40 mg IV lasix as ordered. Do not given lasix 60 mg po tonight. Will evaluate diuretics again tomorrow. Reported to Marton Redwood.

## 2013-04-13 LAB — BASIC METABOLIC PANEL
BUN: 25 mg/dL — AB (ref 6–23)
CO2: 31 meq/L (ref 19–32)
Calcium: 8.7 mg/dL (ref 8.4–10.5)
Chloride: 104 mEq/L (ref 96–112)
Creatinine, Ser: 1.01 mg/dL (ref 0.50–1.35)
GFR calc Af Amer: >90 mL/min (ref >90)
GFR calc non Af Amer: 89 mL/min — ABNORMAL LOW (ref >90)
GLUCOSE: 99 mg/dL (ref 70–99)
Potassium: 5 mEq/L (ref 3.7–5.3)
SODIUM: 144 meq/L (ref 137–147)

## 2013-04-13 NOTE — Progress Notes (Signed)
RT took CPAP into patients room.  Patient up in wheelchair and eating at this time.  Made patient aware that when he was ready for bed to let his RN know and RT would come back and set patient up with CPAP.

## 2013-04-13 NOTE — Progress Notes (Signed)
Patient Name: Jerry Edwards Date of Encounter: 04/13/2013  Principal Problem:   Systolic CHF, acute on chronic Active Problems:   Chest pain   Osteogenesis imperfecta   HTN (hypertension), malignant   Abdominal bloating   Suspected OSA (obstructive sleep apnea) / OHS   Bradycardia with pauses / ? junctional rhythm    SUBJECTIVE:  Still not at baseline. Would not be able to wear O2 at work. Never been told about OSA.   OBJECTIVE Filed Vitals:   04/12/13 2004 04/13/13 0001 04/13/13 0500 04/13/13 0800  BP: 107/57 156/79 126/52 142/79  Pulse: 70 75 78 77  Temp: 98.4 F (36.9 C) 97.4 F (36.3 C) 98.6 F (37 C) 98.5 F (36.9 C)  TempSrc:  Oral Oral Oral  Resp: 18 20 18 18   Height:      Weight:      SpO2: 94% 98% 97% 91%    Intake/Output Summary (Last 24 hours) at 04/13/13 0844 Last data filed at 04/13/13 0843  Gross per 24 hour  Intake    500 ml  Output   1675 ml  Net  -1175 ml   Filed Weights   04/10/13 0542 04/11/13 0601 04/12/13 0547  Weight: 207 lb 14.4 oz (94.303 kg) 209 lb 8 oz (95.029 kg) 209 lb 14.1 oz (95.2 kg)    PHYSICAL EXAM General: Well developed, well nourished, male in no acute distress. Head: Normocephalic, atraumatic.  Neck: Supple without bruits, JVD difficult to assess secondary to body habitus. Lungs:  Resp regular and unlabored, decreased BS bases with some rales. Heart: RRR, S1, S2, no S3, S4, soft murmur; no rub. Abdomen: Soft, non-tender, non-distended, BS + x 4.  Extremities: No clubbing, cyanosis, 1-2+ LE edema. Right radial without ecchymosis or bruit.  Neuro: Alert and oriented X 3. Moves all extremities spontaneously. Psych: Normal affect.  LABS: CBC: Recent Labs  04/12/13 0424 04/12/13 1905  WBC 5.7 4.2  HGB 12.0* 12.5*  HCT 41.6 42.4  MCV 94.5 97.0  PLT 288 [id number]   Basic Metabolic Panel: Recent Labs  04/12/13 0424 04/12/13 1905 04/13/13 0440  NA 143  --  144  K 4.7  --  5.0  CL 102  --  104  CO2 32  --  31   GLUCOSE 86  --  99  BUN 24*  --  25*  CREATININE 1.00 0.91 1.01  CALCIUM 9.3  --  8.7   BNP: Pro B Natriuretic peptide (BNP)  Date/Time Value Range Status  04/10/2013  5:27 AM 1600.0* 0 - 125 pg/mL Final  04/08/2013  2:58 AM 6010.0* 0 - 125 pg/mL Final   Thyroid Function Tests: Recent Labs  04/12/13 0424  TSH 0.607   TELE:  SR w/ sinus brady, ?non-conducted PACs, not sustained < 45, generally at night.      Current Medications:  . aspirin  325 mg Oral Daily  . carvedilol  25 mg Oral BID WC  . enoxaparin (LOVENOX) injection  40 mg Subcutaneous Q24H  . furosemide  40 mg Intravenous Q12H  . furosemide  60 mg Oral BID  . hydrALAZINE  25 mg Oral Q6H  . isosorbide dinitrate  5 mg Oral TID  . lisinopril  20 mg Oral BID  . potassium chloride  20 mEq Oral BID  . sodium chloride  3 mL Intravenous Q12H      ASSESSMENT AND PLAN: Principal Problem:   Systolic CHF, acute on chronic - continue diuresis with IV Lasix, Resp not  at baseline; may not qualify for home O2. See cath report, recommends eval for infiltrative CM w/ MRI due to restrictive physiology.  Active Problems:   Chest pain - no sig CAD at cath, follow    Osteogenesis imperfecta - follow    HTN (hypertension), malignant - SBP 104-156 last 24 hr, continue to follow, generally better control    Abdominal bloating - ?secondary to CHF    Suspected OSA (obstructive sleep apnea) / OHS - Case mgr to see and advise process for home CPAP, try it here and see how tolerated.    Bradycardia with pauses / ? junctional rhythm - no Sx, on Coreg 25 mg BID; bradycardia most significant at 4 am, no pauses > 3 sec, sinus brady with ?non-conducted PACs, possibly secondary to OSA. Follow  Signed, Rosaria Ferries , PA-C 8:44 AM 04/13/2013  I have personally seen and examined this patient with Rosaria Ferries, PA-C. I agree with the assessment and plan as outlined above. Pt with non-ischemic cardiomyopathy. Admitted with volume overload.  No evidence of obstructive CAD on cath 04/12/13. Hemodynamics suggestive of restrictive cardiomyopathy. To further evaluate and exclude infiltrative cardiomyopathy, will need a cardiac MRI. This can be done as an inpatient or outpatient. Pt still with volume overload on exam and PCWP 29 yesterday. Would continue IV Lasix today. Continue to follow on telemetry. Noted to have pauses which are most likely related to sleep apnea. He will need EP referral as an outpatient for discussion regarding ICD.   MCALHANY,CHRISTOPHER 9:57 AM 04/13/2013

## 2013-04-13 NOTE — Progress Notes (Signed)
Patient out of bed in wheelchair, O2 saturations monitored during activity. Wheeled from sink to bed and transferred from wheelchair to bed. O2 saturations on room air. 88% and decreased to 83% with activity. Increased to 85% with rest in bed.2 liters O2 applied and patient resting quietly with o2 sats 92% with 2 liters O2 via nasal cannula.

## 2013-04-13 NOTE — Progress Notes (Addendum)
[facility] PROGRESS NOTE  Starsky Ozaeta [id number] DOB: 1970/04/25 DOA: 04/08/2013 PCP: [name], MD   Brief narrative 43 y.o. male with Past medical history of osteogenesis imperfecta, hypertension, nonischemic cardiomyopathy presented with SOB and chest pain. admitted for r/o ACS and Acute CHF exacerbation.  Assessment/Plan:  Systolic CHF, acute on chronic  Repeat 2 D Echo with EF 20-25% (prior EF 25-30%).  Switched to IV lasix 40 mg bid. Diuresing well. -Dyspnea improving . Renal function stable.  -cardiac cath done on 1/21 with non obstructive CAD. Cardiology considering getting cardiac MRI for possible to r/o infiltrative cardiomyopathy. Will defer to them.  Active Problems:  Bradycardia with pauses / ? Junctional rhythm  Stable On Coreg.    Abdominal bloating  Possibly hepartic congestion due to CHF. Now resolved.  ? OSA/OHS  RN reports that patient's oxygen saturations drop when he is sleeping. ? OSA.  -had apneic episodes during cath  -Recommend outpatient sleep study.   Chest pain  Resolved. 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 Continue ASA and BB.  Osteogenesis imperfecta   recommend outpatient surveillance for osteoporosis.   HTN (hypertension), malignant with HTN heart disease  Continue Coreg, hydralazine and lisinopril and Isordil. BP stable  Family Communication: at beside  Disposition Plan: Home likely in am  Procedures/Studies:  Cardiac cath on 1/21   Antibiotics:  none  HPI/Subjective: SOB much improved. No overnight issues  Objective: Filed Vitals:   04/13/13 1128  BP: 142/83  Pulse: 74  Temp: 98.7 F (37.1 C)  Resp: 18    Intake/Output Summary (Last 24 hours) at 04/13/13 1338 Last data filed at 04/13/13 1128  Gross per 24 hour  Intake    240 ml  Output   2775 ml  Net  -2535 ml   Filed Weights   04/11/13 0601 04/12/13 0547 04/13/13 0900  Weight: 95.029 kg (209 lb 8 oz) 95.2 kg (209 lb 14.1 oz) 94.8 kg (208 lb 15.9 oz)     Exam:  General: middle aged male not in acute distress  HEENT: no pallor, moist mucosa  Cardiovascular: RRR, S1/S2 normal , no rubs, no gallops  Respiratory: fine Basilar crackles. No added sounds Abdomen: Soft/+BS, non tender, non distended,   Extremities: 1+ LE edema,     Data Reviewed: Basic Metabolic Panel:  Recent Labs Lab 04/08/13 0447 04/09/13 0550 04/10/13 0527 04/11/13 0443 04/12/13 0424 04/12/13 1905 04/13/13 0440  NA  --  141 142 143 143  --  144  K  --  4.2 4.8 4.4 4.7  --  5.0  CL  --  102 102 103 102  --  104  CO2  --  28 29 30  32  --  31  GLUCOSE  --  120* 106* 92 86  --  99  BUN  --  25* 28* 26* 24*  --  25*  CREATININE  --  1.07 1.08 1.07 1.00 0.91 1.01  CALCIUM  --  9.1 9.2 9.3 9.3  --  8.7  MG 1.9  --   --   --   --   --   --    Liver Function Tests:  Recent Labs Lab 04/09/13 0550  AST 34  ALT 29  ALKPHOS 65  BILITOT 0.6  PROT 6.9  ALBUMIN 2.8*   No results found for this basename: LIPASE, AMYLASE,  in the last 168 hours No results found for this basename: AMMONIA,  in the last 168 hours CBC:  Recent Labs Lab 04/08/13 0258  04/09/13 0550 04/12/13 0424 04/12/13 1905  WBC 7.1 6.2 5.7 4.2  NEUTROABS 4.5  --   --   --   HGB 12.9* 11.6* 12.0* 12.5*  HCT 42.6 39.6 41.6 42.4  MCV 92.8 93.2 94.5 97.0  PLT 256 237 288 238   Cardiac Enzymes:  Recent Labs Lab 04/08/13 0258 04/08/13 1055 04/08/13 1600  TROPONINI <0.30 <0.30 <0.30   BNP (last 3 results)  Recent Labs  07/11/12 1117 04/08/13 0258 04/10/13 0527  PROBNP 214.0* 6010.0* 1600.0*   CBG: No results found for this basename: GLUCAP,  in the last 168 hours  No results found for this or any previous visit (from the past 240 hour(s)).   Studies: No results found.  Scheduled Meds: . aspirin  325 mg Oral Daily  . carvedilol  25 mg Oral BID WC  . enoxaparin (LOVENOX) injection  40 mg Subcutaneous Q24H  . furosemide  40 mg Intravenous Q12H  . hydrALAZINE  25 mg  Oral Q6H  . isosorbide dinitrate  5 mg Oral TID  . lisinopril  20 mg Oral BID  . potassium chloride  20 mEq Oral BID  . sodium chloride  3 mL Intravenous Q12H   Continuous Infusions:     Time spent: 25 minutes    Yaremi Stahlman, [place]  [facility] Pager [phone] If 7PM-7AM, please contact night-coverage at [email or web address], password [facility] 04/13/2013, 1:38 PM  LOS: 5 days

## 2013-04-13 NOTE — Progress Notes (Signed)
RT set up patient with CPAP machine.  Patient has never worn CPAP before.  MD wanted to trial patient on CPAP tonight.  RT set machine to 5cmH2O, bled in 3L oxygen, and used a nasal mask.  Patientis tolerating fair at this time, states that it is hard to breathe with that much air blowing.  RT explained to patient that if he could not tolerate it to let his nurse know so she could hook his nasal cannula back up to the flow meter.

## 2013-04-14 ENCOUNTER — Inpatient Hospital Stay (HOSPITAL_COMMUNITY): Payer: Medicare Other

## 2013-04-14 DIAGNOSIS — I428 Other cardiomyopathies: Secondary | ICD-10-CM

## 2013-04-14 DIAGNOSIS — I5033 Acute on chronic diastolic (congestive) heart failure: Secondary | ICD-10-CM

## 2013-04-14 DIAGNOSIS — I5043 Acute on chronic combined systolic (congestive) and diastolic (congestive) heart failure: Principal | ICD-10-CM

## 2013-04-14 LAB — FOLATE: Folate: 19.2 ng/mL

## 2013-04-14 LAB — IRON AND TIBC
Iron: 204 ug/dL — ABNORMAL HIGH (ref 42–135)
Saturation Ratios: 39 % (ref 20–55)
TIBC: 529 ug/dL — AB (ref 215–435)
UIBC: 325 ug/dL (ref 125–400)

## 2013-04-14 LAB — BASIC METABOLIC PANEL
BUN: 18 mg/dL (ref 6–23)
CHLORIDE: 102 meq/L (ref 96–112)
CO2: 32 mEq/L (ref 19–32)
Calcium: 8.9 mg/dL (ref 8.4–10.5)
Creatinine, Ser: 0.82 mg/dL (ref 0.50–1.35)
Glucose, Bld: 97 mg/dL (ref 70–99)
Potassium: 4.5 mEq/L (ref 3.7–5.3)
Sodium: 143 mEq/L (ref 137–147)

## 2013-04-14 LAB — VITAMIN B12: Vitamin B-12: 809 pg/mL (ref 211–911)

## 2013-04-14 LAB — RETICULOCYTES
RBC.: 4.32 MIL/uL (ref 4.22–5.81)
RETIC CT PCT: 0.7 % (ref 0.4–3.1)
Retic Count, Absolute: 30.2 10*3/uL (ref 19.0–186.0)

## 2013-04-14 LAB — FERRITIN: FERRITIN: 156 ng/mL (ref 22–322)

## 2013-04-14 MED ORDER — ALUM HYDROXIDE-MAG TRISILICATE 80-20 MG PO CHEW
2.0000 | CHEWABLE_TABLET | Freq: Three times a day (TID) | ORAL | Status: DC
Start: 2013-04-14 — End: 2013-04-14
  Filled 2013-04-14 (×3): qty 2

## 2013-04-14 MED ORDER — ALUM & MAG HYDROXIDE-SIMETH 200-200-20 MG/5ML PO SUSP
15.0000 mL | Freq: Three times a day (TID) | ORAL | Status: DC
  Administered 2013-04-14 – 2013-04-21 (×20): 15 mL via ORAL
  Filled 2013-04-14 (×27): qty 30

## 2013-04-14 MED ORDER — HYDRALAZINE HCL 25 MG PO TABS
25.0000 mg | ORAL_TABLET | Freq: Three times a day (TID) | ORAL | Status: DC
Start: 2013-04-14 — End: 2013-04-16
  Administered 2013-04-14 – 2013-04-16 (×4): 25 mg via ORAL
  Filled 2013-04-14 (×10): qty 1

## 2013-04-14 MED ORDER — SPIRONOLACTONE 12.5 MG HALF TABLET
12.5000 mg | ORAL_TABLET | Freq: Every day | ORAL | Status: DC
  Administered 2013-04-14 – 2013-04-18 (×5): 12.5 mg via ORAL
  Filled 2013-04-14 (×7): qty 1

## 2013-04-14 MED ORDER — FUROSEMIDE 10 MG/ML IJ SOLN
80.0000 mg | Freq: Two times a day (BID) | INTRAMUSCULAR | Status: DC
  Administered 2013-04-14 – 2013-04-16 (×5): 80 mg via INTRAVENOUS
  Filled 2013-04-14 (×6): qty 8

## 2013-04-14 MED ORDER — ISOSORBIDE MONONITRATE ER 30 MG PO TB24
30.0000 mg | ORAL_TABLET | Freq: Every day | ORAL | Status: DC
  Administered 2013-04-14 – 2013-04-15 (×2): 30 mg via ORAL
  Filled 2013-04-14 (×3): qty 1

## 2013-04-14 MED ORDER — GADOBENATE DIMEGLUMINE 529 MG/ML IV SOLN
30.0000 mL | Freq: Once | INTRAVENOUS | Status: AC | PRN
  Administered 2013-04-14: 30 mL via INTRAVENOUS

## 2013-04-14 MED ORDER — FUROSEMIDE 40 MG PO TABS
60.0000 mg | ORAL_TABLET | Freq: Two times a day (BID) | ORAL | Status: DC
  Filled 2013-04-14 (×3): qty 1

## 2013-04-14 NOTE — Progress Notes (Addendum)
Patient Name: Jerry Edwards Date of Encounter: 04/14/2013  Principal Problem:   Systolic CHF, acute on chronic Active Problems:   Chest pain   Osteogenesis imperfecta   HTN (hypertension), malignant   Abdominal bloating   Suspected OSA (obstructive sleep apnea) / OHS   Bradycardia with pauses / ? junctional rhythm   Primary Cardiologist: Dr. Cathie Olden  SUBJECTIVE: Had problems with depression after death of brother 6 mo ago and grandmother a week ago. [birth year]ylar was his [birth year]. He does not know dry weight and feels is breathing some better but has abdominal fullness, thinks Gaviscon will help. Denies orthopnea or CP. + Edema. 24 hr I/O /2 liters.   OBJECTIVE Filed Vitals:   04/13/13 2042 04/14/13 0001 04/14/13 0400 04/14/13 0719  BP:  139/83 140/91 119/53  Pulse: 72 71 65   Temp:  98.5 F (36.9 C) 98.1 F (36.7 C) 98.5 F (36.9 C)  TempSrc:  Oral Oral Oral  Resp: 20 18 18 18   Height:      Weight:   209 lb 7 oz (95 kg)   SpO2: 100% 99% 94% 97%    Intake/Output Summary (Last 24 hours) at 04/14/13 0737 Last data filed at 04/14/13 0038  Gross per 24 hour  Intake    820 ml  Output   2650 ml  Net  -1830 ml   Filed Weights   04/12/13 0547 04/13/13 0900 04/14/13 0400  Weight: 209 lb 14.1 oz (95.2 kg) 208 lb 15.9 oz (94.8 kg) 209 lb 7 oz (95 kg)    PHYSICAL EXAM General: Well developed, well nourished, male in no acute distress; in wheelchair Head: Normocephalic, atraumatic.  Neck: Supple without bruits, JVD difficult to assess secondary to body habitus, but appears 10-12 Lungs:  Resp regular and unlabored, decreased BS bases with some rales.. Heart: RRR, S1, S2, no S3, S4, 2/6 murmur; no rub. Abdomen: Soft, non-tender, +distended, BS + x 4.  Extremities: No clubbing, cyanosis, 2+ bilateral LE edema.  Neuro: Alert and oriented X 3. Moves all extremities spontaneously. Psych: Normal affect.  LABS: CBC: Recent Labs  04/12/13 0424 04/12/13 1905  WBC 5.7 4.2   HGB 12.0* 12.5*  HCT 41.6 42.4  MCV 94.5 97.0  PLT 288 99991111   Basic Metabolic Panel: Recent Labs  04/13/13 0440 04/14/13 0523  NA 144 143  K 5.0 4.5  CL 104 102  CO2 31 32  GLUCOSE 99 97  BUN 25* 18  CREATININE 1.01 0.82  CALCIUM 8.7 8.9   BNP: Pro B Natriuretic peptide (BNP)  Date/Time Value Range Status  04/10/2013  5:27 AM 1600.0* 0 - 125 pg/mL Final  04/08/2013  2:58 AM 6010.0* 0 - 125 pg/mL Final   Thyroid Function Tests: Recent Labs  04/12/13 0424  TSH 0.607   TELE:  SR, sinus brady      Radiology/Studies: No results found.   Current Medications:  . aspirin  325 mg Oral Daily  . carvedilol  25 mg Oral BID WC  . enoxaparin (LOVENOX) injection  40 mg Subcutaneous Q24H  . furosemide  40 mg Intravenous Q12H  . hydrALAZINE  25 mg Oral Q6H  . isosorbide dinitrate  5 mg Oral TID  . lisinopril  20 mg Oral BID  . potassium chloride  20 mEq Oral BID  . 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 EF 20-25% with RV dilated and systolic fx mod reduced. He has been receiving lasix 40 mg IV BID with good UOP. I/O negative by 6 L but weight is not changing, however he can't stand. Cr stable. He still appears to have quite a bit of volume on board, UOP very dark. Will increase lasix to 80 mg IV BID. Place TED hose. Possible infiltrative disease, see cath report, recommends eval for infiltrative CM w/ MRI due to restrictive physiology. Will order cMRI today along with SPEP and UPEP. HIV negative and TSH nl. On goal dose BB coreg 25 mg BID. He has had some bradycardia in the evening with no symptoms will continue and if develops symptoms can cut back. Will change isordil to IMDUR 30 mg daily and hydralazine to 25 mg TID. Will also add spiro 12.5 mg daily.  Continue ACE-I at current dose. Dietician consult for HF education. Transfer to [place].  Active Problems:   Chest pain - ez negative, EF 20-25%; no sig CAD at cath, follow    Osteogenesis imperfecta - per IM    HTN (hypertension), malignant - improved control w/ diuresis; as above will add Spiro    Abdominal bloating - pt asking for Gaviscon will order, however likely related to volume overload.    Suspected OSA (obstructive sleep apnea) / OHS - Using CPAP in the hospital which he reports feeling better with, however complains of dry mucous membranes. Keep using CPAP while here and will need to sch sleep study as OP.    Bradycardia with pauses / ? junctional rhythm - No pauses over 3 seconds, bradycardia at HR < 40 lasts less than 10 seconds, no symptoms, possibly during sleep (after 10 pm yest), follow, continue Coreg for now.    Obesity - pt has been stress-eating after loss of brother/grandmother    Depression - situational after family deaths. Caused non-compliance w/ diet, possibly medications. Pt would appreciate counseling opportunity, will ask case mgt for options.   Signed, Junie Bame B NP-C 10:22 AM  Patient seen with NP, agree with the above note.   43 yo with nonischemic cardiomyopathy was admitted with acute/chronic systolic CHF.  RHC showed markedly elevated right and left heart filling pressures and primarily pulmonary venous hypertension.  He has a nonischemic cardiomyopathy of uncertain etiology: possibly hypertension-related as he has had long-standing HTN.  He has osteogenesis imperfecta but I do not know of any cardiac effects from this.  He had a negative HIV test in 2014.  - Cardiac MRI today to assess for infiltrative disease.  - Check Fe studies, serum/urine immunofixation.  - He remains volume overloaded on exam.  Will increase Lasix to 80 mg IV bid.  - Continue Coreg.  Adjust hydralazine to 25 mg tid with Imdur 30 daily.  Continue lisinopril 20 daily.  Add  spironolactone 12.5 mg daily.  - He will need ICD eventually, does not qualify for BiV device with narrow QRS.  - He has relatively short sinus pauses in his sleep.  Suspect this is OSA-related.  Needs OSA treatment, will arrange for outpatient sleep study.   Loralie Champagne 04/14/2013 10:54 AM

## 2013-04-14 NOTE — Progress Notes (Signed)
[facility] PROGRESS NOTE  Jerry Edwards [id number] DOB: December 02, 1970 DOA: 04/08/2013 PCP: [name], MD  Brief narrative  43 y.o. male with Past medical history of osteogenesis imperfecta, hypertension, nonischemic cardiomyopathy presented with SOB and chest pain. admitted for r/o ACS and Acute CHF exacerbation.   Assessment/Plan:  Systolic CHF, acute on chronic  Repeat 2 D Echo with EF 20-25% (prior EF 25-30%).  Switched to IV lasix 40 mg bid. Diuresing well.  -Dyspnea improving . Renal function stable.  -cardiac cath done on 1/21 with non obstructive CAD.  cardiac MRI for possible to r/o infiltrative cardiomyopathy.  -still volume overloaded. Switched to IV lasix 80 gm bid. Added aldactone and ACEi.  Active Problems:  Bradycardia with pauses / ? Junctional rhythm  Stable On Coreg. Possibly related to OSA. CPAP ordered.  Abdominal bloating  Possibly hepartic congestion due to CHF. Now resolved.   ? OSA/OHS  RN reports that patient's oxygen saturations drop when he is sleeping. Also has short sinus pauses on monitor. -had apneic episodes during cath  -Recommend outpatient sleep study. Started on CPAP here.  Chest pain  Resolved. 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 Continue ASA and BB.   Osteogenesis imperfecta  recommend outpatient surveillance for osteoporosis.   HTN (hypertension), malignant with HTN heart disease  Continue Coreg, hydralazine and lisinopril and Isordil. BP stable   Family Communication: at beside   Disposition Plan: Home once SOB better  Consult: cardiology  Procedures/Studies:  Cardiac cath on 1/21   Antibiotics:  None  HPI/Subjective:  Feels SOB to be better.  No overnight issues   Objective: Filed Vitals:   04/14/13 1252  BP: 168/96  Pulse: 78  Temp: 98.6 F (37 C)  Resp: 18    Intake/Output Summary (Last 24 hours) at 04/14/13 1333 Last data filed at 04/14/13 [id number]  Gross per 24 hour  Intake    460 ml  Output   2700 ml   Net  -2240 ml   Filed Weights   04/12/13 0547 04/13/13 0900 04/14/13 0400  Weight: 95.2 kg (209 lb 14.1 oz) 94.8 kg (208 lb 15.9 oz) 95 kg (209 lb 7 oz)    Exam:  General: middle aged male not in acute distress  HEENT: no pallor, moist mucosa  Cardiovascular: RRR, S1/S2 normal , no rubs, no gallops  Respiratory: fine Basilar crackles. No added sounds  Abdomen: Soft/+BS, non tender, non distended,  Extremities: 1+ LE edema,    Data Reviewed: Basic Metabolic Panel:  Recent Labs Lab 04/08/13 0447  04/10/13 0527 04/11/13 0443 04/12/13 0424 04/12/13 1905 04/13/13 0440 04/14/13 0523  NA  --   < > 142 143 143  --  144 143  K  --   < > 4.8 4.4 4.7  --  5.0 4.5  CL  --   < > 102 103 102  --  104 102  CO2  --   < > 29 30 32  --  31 32  GLUCOSE  --   < > 106* 92 86  --  99 97  BUN  --   < > 28* 26* 24*  --  25* 18  CREATININE  --   < > 1.08 1.07 1.00 0.91 1.01 0.82  CALCIUM  --   < > 9.2 9.3 9.3  --  8.7 8.9  MG 1.9  --   --   --   --   --   --   --   < > = values  in this interval not displayed. Liver Function Tests:  Recent Labs Lab 04/09/13 0550  AST 34  ALT 29  ALKPHOS 65  BILITOT 0.6  PROT 6.9  ALBUMIN 2.8*   No results found for this basename: LIPASE, AMYLASE,  in the last 168 hours No results found for this basename: AMMONIA,  in the last 168 hours CBC:  Recent Labs Lab 04/08/13 0258 04/09/13 0550 04/12/13 0424 04/12/13 1905  WBC 7.1 6.2 5.7 4.2  NEUTROABS 4.5  --   --   --   HGB 12.9* 11.6* 12.0* 12.5*  HCT 42.6 39.6 41.6 42.4  MCV 92.8 93.2 94.5 97.0  PLT 256 237 288 238   Cardiac Enzymes:  Recent Labs Lab 04/08/13 0258 04/08/13 1055 04/08/13 1600  TROPONINI <0.30 <0.30 <0.30   BNP (last 3 results)  Recent Labs  07/11/12 1117 04/08/13 0258 04/10/13 0527  PROBNP 214.0* 6010.0* 1600.0*   CBG: No results found for this basename: GLUCAP,  in the last 168 hours  No results found for this or any previous visit (from the past 240  hour(s)).   Studies: No results found.  Scheduled Meds: . alum hydroxide-mag trisilicate  2 tablet Oral TID AC  . carvedilol  25 mg Oral BID WC  . enoxaparin (LOVENOX) injection  40 mg Subcutaneous Q24H  . furosemide  80 mg Intravenous BID  . hydrALAZINE  25 mg Oral Q8H  . isosorbide mononitrate  30 mg Oral Daily  . lisinopril  20 mg Oral BID  . potassium chloride  20 mEq Oral BID  . sodium chloride  3 mL Intravenous Q12H  . spironolactone  12.5 mg Oral Daily   Continuous Infusions:     Time spent: 25 minutes    Olivier Frayre  [facility] Pager [phone]. If 7PM-7AM, please contact night-coverage at [email or web address], password [facility] 04/14/2013, 1:33 PM  LOS: 6 days

## 2013-04-14 NOTE — Progress Notes (Signed)
04/14/13 2153  BiPAP/CPAP/SIPAP  BiPAP/CPAP/SIPAP Pt Type Adult  Mask Type Nasal mask  Mask Size Medium  BiPAP/CPAP/SIPAP CPAP  Patient Home Equipment No  Auto Titrate Yes  Auto titrate max 18 min 6 with 2lpm 02 bleed in.  Patient is tolerating well.

## 2013-04-14 NOTE — Plan of Care (Signed)
Problem: Food- and Nutrition-Related Knowledge Deficit (NB-1.1) Goal: Nutrition education Formal process to instruct or train a patient/client in a skill or to impart knowledge to help patients/clients voluntarily manage or modify food choices and eating behavior to maintain or improve health. Outcome: Progressing Nutrition Education Note  RD consulted for nutrition education regarding CHF.  Pt lost his brother 6 months ago and has since put on 40 lb. Pt has been eating foods he knows that he should not. He has been picking up fast food on the way home from work as he works late.  Pt and mother do the shopping and cooking at home.  Pt feels he knows what to eat. We spent most of our time on finding hidden sodium.  Pt seemed willing to change.   RD provided "Low Sodium Nutrition Therapy" handout from the Academy of [employer]. Reviewed patient's dietary recall. Provided examples on ways to decrease sodium intake in diet. Discouraged intake of processed foods and use of salt shaker. Encouraged fresh fruits and vegetables as well as whole grain sources of carbohydrates to maximize fiber intake.   RD discussed why it is important for patient to adhere to diet recommendations, and emphasized the role of fluids, foods to avoid, and importance of weighing self daily. Teach back method used.  Expect fair compliance.  Body mass index is 45.31 kg/(m^2). Pt meets criteria for morbidly obese class III based on current BMI.  Current diet order is Heart Healthy, patient is consuming approximately 100% of meals at this time. Labs and medications reviewed. No further nutrition interventions warranted at this time. RD contact information provided. If additional nutrition issues arise, please re-consult RD.   [place], [place], [place] Pager [phone] After Hours Pager

## 2013-04-15 DIAGNOSIS — J189 Pneumonia, unspecified organism: Secondary | ICD-10-CM

## 2013-04-15 DIAGNOSIS — I2789 Other specified pulmonary heart diseases: Secondary | ICD-10-CM

## 2013-04-15 LAB — BASIC METABOLIC PANEL
BUN: 21 mg/dL (ref 6–23)
CALCIUM: 8.9 mg/dL (ref 8.4–10.5)
CO2: 34 meq/L — AB (ref 19–32)
Chloride: 101 mEq/L (ref 96–112)
Creatinine, Ser: 0.85 mg/dL (ref 0.50–1.35)
GFR calc Af Amer: >90 mL/min (ref >90)
GFR calc non Af Amer: >90 mL/min (ref >90)
Glucose, Bld: 127 mg/dL — ABNORMAL HIGH (ref 70–99)
Potassium: 4 mEq/L (ref 3.7–5.3)
Sodium: 143 mEq/L (ref 137–147)

## 2013-04-15 MED ORDER — SALINE SPRAY 0.65 % NA SOLN
1.0000 | NASAL | Status: DC | PRN
  Filled 2013-04-15: qty 44

## 2013-04-15 NOTE — Progress Notes (Addendum)
Patient Name: Jerry Edwards Date of Encounter: 04/15/2013  Principal Problem:   Acute on chronic combined systolic and diastolic CHF, NYHA class 4 Active Problems:   Chest pain   Osteogenesis imperfecta   HTN (hypertension), malignant   Abdominal bloating   Suspected OSA (obstructive sleep apnea) / OHS   Bradycardia with pauses / ? junctional rhythm   Primary Cardiologist: Dr. Cathie Olden  SUBJECTIVE: Had problems with depression after death of brother 6 mo ago and grandmother a week ago. Denies orthopnea or CP. + Edema.   OBJECTIVE Filed Vitals:   04/14/13 2015 04/15/13 0226 04/15/13 0533 04/15/13 0830  BP: 134/83 108/56 143/74 145/88  Pulse: 79 75 79 73  Temp: 98.3 F (36.8 C)  98 F (36.7 C)   TempSrc: Oral  Oral   Resp: 18 18 18    Height:      Weight: 209 lb 7 oz (95 kg)  207 lb 10.8 oz (94.2 kg)   SpO2: 92%  90%     Intake/Output Summary (Last 24 hours) at 04/15/13 1319 Last data filed at 04/15/13 1154  Gross per 24 hour  Intake   1886 ml  Output   5125 ml  Net  -3239 ml   Filed Weights   04/14/13 0400 04/14/13 2015 04/15/13 0533  Weight: 209 lb 7 oz (95 kg) 209 lb 7 oz (95 kg) 207 lb 10.8 oz (94.2 kg)    PHYSICAL EXAM General: Well developed, well nourished, male in no acute distress; in wheelchair. Characteristics of osteogenesis inperfecta.  Head: Normocephalic, atraumatic.  Neck: Supple without bruits, JVD difficult to assess secondary to body habitus, but appears 10-12 Lungs:  Resp regular and unlabored, decreased BS bases with some rales.. Heart: RRR, S1, S2, no S3, S4, 2/6 murmur; no rub. Abdomen: Soft, non-tender, +distended, BS + x 4.  Extremities: No clubbing, cyanosis, 2+ bilateral LE edema.  Neuro: Alert and oriented X 3. Moves all extremities spontaneously. Psych: Normal affect.  LABS: CBC:  Recent Labs  04/12/13 1905  WBC 4.2  HGB 12.5*  HCT 42.4  MCV 97.0  PLT 99991111   Basic Metabolic Panel:  Recent Labs  04/14/13 0523  04/15/13 0600  NA 143 143  K 4.5 4.0  CL 102 101  CO2 32 34*  GLUCOSE 97 127*  BUN 18 21  CREATININE 0.82 0.85  CALCIUM 8.9 8.9   BNP: Pro B Natriuretic peptide (BNP)  Date/Time Value Range Status  04/10/2013  5:27 AM 1600.0* 0 - 125 pg/mL Final  04/08/2013  2:58 AM 6010.0* 0 - 125 pg/mL Final   TELE:  SR, sinus brady      Radiology/Studies: Mr Card Morphology Wo/w Cm  04/14/2013   CLINICAL DATA:  Cardiomyopathy of uncertain etiology  EXAM: CARDIAC MRI  TECHNIQUE: The patient was scanned on a 1.5 Tesla GE magnet. A dedicated cardiac coil was used. Functional imaging was done using Fiesta sequences. 2,3, and 4 chamber views were done to assess for RWMA's. Modified Simpson's rule using a short axis stack was used to calculate an ejection fraction on a dedicated work Conservation officer, nature. The patient received 30 cc of Multihance. After 10 minutes inversion recovery sequences were used to assess for infiltration and scar tissue.  CONTRAST:  30 cc Multihance  MEASUREMENTS: MEASUREMENTS LV EDV 230 mL  LV SV 87 mL  LV EF 38%  FINDINGS: Limited views of the lung fields did not show gross abnormalities. Normal left ventricular size with mild  LV hypertrophy. There was moderate global hypokinesis with EF 38%. Mild left atrial enlargement, normal right atrial size. Normal right ventricular size with mild to moderately decreased systolic function. There did not appear to be significant mitral regurgitation. The aortic valve was trileaflet without significant stenosis or regurgitation.  Delayed enhancement images were difficult. There appeared to be a small discrete area of mid-wall enhancement in the mid inferolateral wall. There was a small discrete area of subendocardial enhancement in the mid anterolateral wall. There was a small discrete area of subepicardial enhancement at the mid inferoseptal RV insertion site.  IMPRESSION: 1. Normal LV size with mild LV hypertrophy. EF 38% with global  hypokinesis.  2.  Normal RV size with mild to moderate RV systolic dysfunction.  3. Non-coronary delayed enhancement pattern as described above. This suggests the possibility of an infiltrative disease such as cardiac involvement by sarcoidosis.  Dalton Mclean   Electronically Signed   By: Loralie Champagne M.D.   On: 04/14/2013 18:46    . alum & mag hydroxide-simeth  15 mL Oral TID AC  . carvedilol  6.25 mg Oral BID WC  . enoxaparin (LOVENOX) injection  40 mg Subcutaneous Q24H  . furosemide  80 mg Intravenous BID  . lisinopril  5 mg Oral BID  . potassium chloride  20 mEq Oral BID  . sodium chloride  3 mL Intravenous Q12H  . 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 EF 20-25% with RV dilated and systolic fx mod reduced. I/O negative by -11 L but weight is not changing, however he can't stand. Cr stable. He still appears to have quite a bit of volume on board. On lasix  80 mg IV BID. Place TED hose. Possible infiltrative disease on MRI can not exclude sarcoid. EF 38%.  See cath report. SPEP and UPEP pending. HIV negative and TSH nl.   (CORRECTION IN MEDS. NOT ON HOME DOSE AS WRITTEN BELOW, ON DOSE AS ABOVE IN CURRENT MED LIST) addendum 04/16/13 at 2:39pm On goal dose BB coreg 25 mg BID. He has had some bradycardia in the evening with no symptoms will continue and if develops symptoms can cut back. IMDUR 30 mg daily and hydralazine to 25 mg TID. Also spironolactone 12.5 mg daily. Continue ACE-I at current dose. Dietician consult for HF education.   Chest pain - trop negative, EF 20-25%; no sig CAD at cath, follow  Osteogenesis imperfecta - per IM  HTN (hypertension), malignant - improved control w/ diuresis; as above will add [name]. May be reason for NICM  Abdominal bloating - pt asking for  Gaviscon will order, however likely related to volume overload.  Suspected OSA (obstructive sleep apnea) / OHS - Using CPAP in the hospital which he reports feeling better with, however complains of dry mucous membranes. Keep using CPAP while here and will need to sch sleep study as OP.  Bradycardia with pauses / ? junctional rhythm - No pauses over 3 seconds, bradycardia at HR < 40 lasts less than 10 seconds, no symptoms, possibly during sleep (after 10 pm yest), follow, continue Coreg for now.  Obesity - pt has been stress-eating after loss of brother/grandmother  Depression - situational after family deaths. Caused non-compliance w/ diet, possibly medications. Pt would appreciate counseling opportunity, will ask case mgt for options.  Still has more fluid to diurese.  Hanifa Antonetti 04/15/2013 1:19 PM

## 2013-04-15 NOTE — Progress Notes (Signed)
Chart reviewed.  [facility] PROGRESS NOTE  Jerry Edwards [id number] DOB: 1970/05/02 DOA: 04/08/2013 PCP: [name], MD  Brief narrative  42 y.o. male with Past medical history of osteogenesis imperfecta, hypertension, nonischemic cardiomyopathy presented with SOB and chest pain. admitted for r/o ACS and Acute CHF exacerbation.   Assessment/Plan:  Systolic CHF, acute on chronic  Ace inhibitor, coreg, spironolactone IV lasix, management per cardiology. Cardiac MRI suggestive of infiltrative disease.  SPEP and urine IFE pending. Add serum ACE.  Active Problems:  Bradycardia with pauses / ? Junctional rhythm  Stable On Coreg. Possibly related to OSA. CPAP ordered.   ? OSA/OHS  RN reports that patient's oxygen saturations drop when he is sleeping. Also has short sinus pauses on monitor. -had apneic episodes during cath  -Recommend outpatient sleep study. Started on CPAP here.  Osteogenesis imperfecta  recommend outpatient surveillance for osteoporosis.   HTN (hypertension), malignant with HTN heart disease  Continue Coreg, hydralazine and lisinopril and Isordil. BP stable   Family Communication: mother at beside   Disposition Plan: Home once SOB better  Consult: cardiology  Procedures/Studies:  Cardiac cath on 1/21   Antibiotics:  None  HPI/Subjective:  Dyspnea better, but not back to baseline   Objective: Filed Vitals:   04/15/13 1359  BP: 137/88  Pulse: 79  Temp: 98.6 F (37 C)  Resp: 20    Intake/Output Summary (Last 24 hours) at 04/15/13 1612 Last data filed at 04/15/13 1300  Gross per 24 hour  Intake   1886 ml  Output   4325 ml  Net  -2439 ml   Filed Weights   04/14/13 0400 04/14/13 2015 04/15/13 0533  Weight: 95 kg (209 lb 7 oz) 95 kg (209 lb 7 oz) 94.2 kg (207 lb 10.8 oz)    Exam:  General: in WC. Breathing nonlabored HEENT: no pallor, moist mucosa  Cardiovascular: RRR, S1/S2 normal , no rubs, no gallops  Respiratory: CTA  without WRR Abdomen: Soft/+BS, non tender, non distended,  Extremities: trace edema   Data Reviewed: Basic Metabolic Panel:  Recent Labs Lab 04/11/13 0443 04/12/13 0424 04/12/13 1905 04/13/13 0440 04/14/13 0523 04/15/13 0600  NA 143 143  --  144 143 143  K 4.4 4.7  --  5.0 4.5 4.0  CL 103 102  --  104 102 101  CO2 30 32  --  31 32 34*  GLUCOSE 92 86  --  99 97 127*  BUN 26* 24*  --  25* 18 21  CREATININE 1.07 1.00 0.91 1.01 0.82 0.85  CALCIUM 9.3 9.3  --  8.7 8.9 8.9   Liver Function Tests:  Recent Labs Lab 04/09/13 0550  AST 34  ALT 29  ALKPHOS 65  BILITOT 0.6  PROT 6.9  ALBUMIN 2.8*   No results found for this basename: LIPASE, AMYLASE,  in the last 168 hours No results found for this basename: AMMONIA,  in the last 168 hours CBC:  Recent Labs Lab 04/09/13 0550 04/12/13 0424 04/12/13 1905  WBC 6.2 5.7 4.2  HGB 11.6* 12.0* 12.5*  HCT 39.6 41.6 42.4  MCV 93.2 94.5 97.0  PLT 237 288 238   Cardiac Enzymes: No results found for this basename: CKTOTAL, CKMB, CKMBINDEX, TROPONINI,  in the last 168 hours BNP (last 3 results)  Recent Labs  07/11/12 1117 04/08/13 0258 04/10/13 0527  PROBNP 214.0* 6010.0* 1600.0*   CBG: No results found for this basename: GLUCAP,  in the last 168 hours  No results found  for this or any previous visit (from the past 240 hour(s)).   Studies: Mr Card Morphology Wo/w Cm  04/14/2013   CLINICAL DATA:  Cardiomyopathy of uncertain etiology  EXAM: CARDIAC MRI  TECHNIQUE: The patient was scanned on a 1.5 Tesla GE magnet. A dedicated cardiac coil was used. Functional imaging was done using Fiesta sequences. 2,3, and 4 chamber views were done to assess for RWMA's. Modified Simpson's rule using a short axis stack was used to calculate an ejection fraction on a dedicated work Conservation officer, nature. The patient received 30 cc of Multihance. After 10 minutes inversion recovery sequences were used to assess for infiltration and  scar tissue.  CONTRAST:  30 cc Multihance  MEASUREMENTS: MEASUREMENTS LV EDV 230 mL  LV SV 87 mL  LV EF 38%  FINDINGS: Limited views of the lung fields did not show gross abnormalities. Normal left ventricular size with mild LV hypertrophy. There was moderate global hypokinesis with EF 38%. Mild left atrial enlargement, normal right atrial size. Normal right ventricular size with mild to moderately decreased systolic function. There did not appear to be significant mitral regurgitation. The aortic valve was trileaflet without significant stenosis or regurgitation.  Delayed enhancement images were difficult. There appeared to be a small discrete area of mid-wall enhancement in the mid inferolateral wall. There was a small discrete area of subendocardial enhancement in the mid anterolateral wall. There was a small discrete area of subepicardial enhancement at the mid inferoseptal RV insertion site.  IMPRESSION: 1. Normal LV size with mild LV hypertrophy. EF 38% with global hypokinesis.  2.  Normal RV size with mild to moderate RV systolic dysfunction.  3. Non-coronary delayed enhancement pattern as described above. This suggests the possibility of an infiltrative disease such as cardiac involvement by sarcoidosis.  Jerry Edwards   Electronically Signed   By: Loralie Champagne M.D.   On: 04/14/2013 18:46    Scheduled Meds: . alum & mag hydroxide-simeth  15 mL Oral TID AC  . carvedilol  25 mg Oral BID WC  . enoxaparin (LOVENOX) injection  40 mg Subcutaneous Q24H  . furosemide  80 mg Intravenous BID  . hydrALAZINE  25 mg Oral Q8H  . isosorbide mononitrate  30 mg Oral Daily  . lisinopril  20 mg Oral BID  . potassium chloride  20 mEq Oral BID  . sodium chloride  3 mL Intravenous Q12H  . spironolactone  12.5 mg Oral Daily   Continuous Infusions:     Time spent: 35 minutes, counselling, going over test results  [place] [facility] Pager [phone]. If 7PM-7AM, please contact  night-coverage at [email or web address], password [facility] 04/15/2013, 4:12 PM  LOS: 7 days

## 2013-04-16 LAB — BASIC METABOLIC PANEL
BUN: 21 mg/dL (ref 6–23)
CO2: 31 meq/L (ref 19–32)
Calcium: 8.9 mg/dL (ref 8.4–10.5)
Chloride: 100 mEq/L (ref 96–112)
Creatinine, Ser: 0.9 mg/dL (ref 0.50–1.35)
GFR calc Af Amer: >90 mL/min (ref >90)
Glucose, Bld: 79 mg/dL (ref 70–99)
Potassium: 4.3 mEq/L (ref 3.7–5.3)
SODIUM: 142 meq/L (ref 137–147)

## 2013-04-16 LAB — ANGIOTENSIN CONVERTING ENZYME: Angiotensin-Converting Enzyme: 6 U/L — ABNORMAL LOW (ref 8–52)

## 2013-04-16 MED ORDER — FUROSEMIDE 40 MG PO TABS
40.0000 mg | ORAL_TABLET | Freq: Two times a day (BID) | ORAL | Status: DC
  Administered 2013-04-16 – 2013-04-17 (×2): 40 mg via ORAL
  Filled 2013-04-16 (×4): qty 1

## 2013-04-16 MED ORDER — LISINOPRIL 5 MG PO TABS
5.0000 mg | ORAL_TABLET | Freq: Two times a day (BID) | ORAL | Status: DC
  Administered 2013-04-16 – 2013-04-17 (×2): 5 mg via ORAL
  Filled 2013-04-16 (×3): qty 1

## 2013-04-16 MED ORDER — CARVEDILOL 6.25 MG PO TABS
6.2500 mg | ORAL_TABLET | Freq: Two times a day (BID) | ORAL | Status: DC
  Administered 2013-04-16 – 2013-04-17 (×2): 6.25 mg via ORAL
  Filled 2013-04-16 (×4): qty 1

## 2013-04-16 NOTE — Progress Notes (Signed)
Discussed with Dr. Radford Pax  [facility] PROGRESS NOTE  Jerry Edwards [id number] DOB: 03-Apr-1970 DOA: 04/08/2013 PCP: [name], MD  Brief narrative  43 y.o. male with Past medical history of osteogenesis imperfecta, hypertension, nonischemic cardiomyopathy presented with SOB and chest pain. admitted for r/o ACS and Acute CHF exacerbation.   Assessment/Plan:  Systolic CHF, acute on chronic  Ace inhibitor, coreg, spironolactone IV lasix, management per cardiology. Cardiac MRI suggestive of infiltrative disease.  SPEP and urine IFE serum ACE pending.  Relative hypotension: hold imdur and hydralazine. Decrease coreg and lisinopril dose for now  Active Problems:  Bradycardia with pauses / ? Junctional rhythm  Stable On Coreg. Possibly related to OSA. CPAP ordered.   ? OSA/OHS  RN reports that patient's oxygen saturations drop when he is sleeping. Also has short sinus pauses on monitor. -had apneic episodes during cath  -Recommend outpatient sleep study. Started on CPAP here.  Osteogenesis imperfecta  recommend outpatient surveillance for osteoporosis.   HTN (hypertension), malignant with HTN heart disease  BP now low  Family Communication: mother 1/24  Disposition Plan: Home once SOB better  Consult: cardiology  Procedures/Studies:  Cardiac cath on 1/21   Antibiotics:  None  HPI/Subjective:  Breathing back to baseline. No weakness or dizziness   Objective: Filed Vitals:   04/16/13 1346  BP: 124/76  Pulse: 75  Temp: 98 F (36.7 C)  Resp: 20    Intake/Output Summary (Last 24 hours) at 04/16/13 1644 Last data filed at 04/16/13 1241  Gross per 24 hour  Intake    843 ml  Output   2050 ml  Net  -1207 ml   Filed Weights   04/14/13 2015 04/15/13 0533 04/16/13 0410  Weight: 95 kg (209 lb 7 oz) 94.2 kg (207 lb 10.8 oz) 93.8 kg (206 lb 12.7 oz)    Exam:  General: in WC. Breathing nonlabored HEENT: no pallor, moist mucosa  Cardiovascular: RRR,  S1/S2 normal , no rubs, no gallops  Respiratory: CTA without WRR Abdomen: Soft/+BS, non tender, non distended,  Extremities: trace edema   Data Reviewed: Basic Metabolic Panel:  Recent Labs Lab 04/12/13 0424 04/12/13 1905 04/13/13 0440 04/14/13 0523 04/15/13 0600 04/16/13 0610  NA 143  --  144 143 143 142  K 4.7  --  5.0 4.5 4.0 4.3  CL 102  --  104 102 101 100  CO2 32  --  31 32 34* 31  GLUCOSE 86  --  99 97 127* 79  BUN 24*  --  25* 18 21 21   CREATININE 1.00 0.91 1.01 0.82 0.85 0.90  CALCIUM 9.3  --  8.7 8.9 8.9 8.9   Liver Function Tests: No results found for this basename: AST, ALT, ALKPHOS, BILITOT, PROT, ALBUMIN,  in the last 168 hours No results found for this basename: LIPASE, AMYLASE,  in the last 168 hours No results found for this basename: AMMONIA,  in the last 168 hours CBC:  Recent Labs Lab 04/12/13 0424 04/12/13 1905  WBC 5.7 4.2  HGB 12.0* 12.5*  HCT 41.6 42.4  MCV 94.5 97.0  PLT 288 238   Cardiac Enzymes: No results found for this basename: CKTOTAL, CKMB, CKMBINDEX, TROPONINI,  in the last 168 hours BNP (last 3 results)  Recent Labs  07/11/12 1117 04/08/13 0258 04/10/13 0527  PROBNP 214.0* 6010.0* 1600.0*   CBG: No results found for this basename: GLUCAP,  in the last 168 hours  No results found for this or any previous visit (from  the past 240 hour(s)).   Studies: Mr Card Morphology Wo/w Cm  04/14/2013   CLINICAL DATA:  Cardiomyopathy of uncertain etiology  EXAM: CARDIAC MRI  TECHNIQUE: The patient was scanned on a 1.5 Tesla GE magnet. A dedicated cardiac coil was used. Functional imaging was done using Fiesta sequences. 2,3, and 4 chamber views were done to assess for RWMA's. Modified Simpson's rule using a short axis stack was used to calculate an ejection fraction on a dedicated work Conservation officer, nature. The patient received 30 cc of Multihance. After 10 minutes inversion recovery sequences were used to assess for infiltration  and scar tissue.  CONTRAST:  30 cc Multihance  MEASUREMENTS: MEASUREMENTS LV EDV 230 mL  LV SV 87 mL  LV EF 38%  FINDINGS: Limited views of the lung fields did not show gross abnormalities. Normal left ventricular size with mild LV hypertrophy. There was moderate global hypokinesis with EF 38%. Mild left atrial enlargement, normal right atrial size. Normal right ventricular size with mild to moderately decreased systolic function. There did not appear to be significant mitral regurgitation. The aortic valve was trileaflet without significant stenosis or regurgitation.  Delayed enhancement images were difficult. There appeared to be a small discrete area of mid-wall enhancement in the mid inferolateral wall. There was a small discrete area of subendocardial enhancement in the mid anterolateral wall. There was a small discrete area of subepicardial enhancement at the mid inferoseptal RV insertion site.  IMPRESSION: 1. Normal LV size with mild LV hypertrophy. EF 38% with global hypokinesis.  2.  Normal RV size with mild to moderate RV systolic dysfunction.  3. Non-coronary delayed enhancement pattern as described above. This suggests the possibility of an infiltrative disease such as cardiac involvement by sarcoidosis.  Jerry Edwards   Electronically Signed   By: Loralie Champagne M.D.   On: 04/14/2013 18:46    Scheduled Meds: . alum & mag hydroxide-simeth  15 mL Oral TID AC  . carvedilol  6.25 mg Oral BID WC  . enoxaparin (LOVENOX) injection  40 mg Subcutaneous Q24H  . furosemide  40 mg Oral BID  . lisinopril  5 mg Oral BID  . potassium chloride  20 mEq Oral BID  . sodium chloride  3 mL Intravenous Q12H  . spironolactone  12.5 mg Oral Daily   Continuous Infusions:     Time spent: 25 minutes, counselling, going over test results  [place] [facility] Pager [phone]. If 7PM-7AM, please contact night-coverage at [email or web address], password [facility] 04/16/2013, 4:44 PM  LOS: 8 days

## 2013-04-16 NOTE — Progress Notes (Signed)
SUBJECTIVE:  Feels much better.  Denies any SOB  OBJECTIVE:   Vitals:   Filed Vitals:   04/15/13 2029 04/16/13 0410 04/16/13 1007 04/16/13 1346  BP:  115/82 97/48 124/76  Pulse:  80  75  Temp:  98 F (36.7 C)  98 F (36.7 C)  TempSrc: Oral Oral  Oral  Resp:  18  20  Height:      Weight:  206 lb 12.7 oz (93.8 kg)    SpO2:  98%  90%   I&O's:   Intake/Output Summary (Last 24 hours) at 04/16/13 1426 Last data filed at 04/16/13 1241  Gross per 24 hour  Intake    843 ml  Output   2050 ml  Net  -1207 ml   TELEMETRY: Reviewed telemetry pt in NSR:     PHYSICAL EXAM General: Well developed, well nourished, in no acute distress Head: Eyes PERRLA, No xanthomas.   Normal cephalic and atramatic  Lungs:   Clear bilaterally to auscultation and percussion. Heart:   HRRR S1 S2 Pulses are 2+ & equal. Abdomen: Bowel sounds are positive, abdomen soft and non-tender without masses  Extremities: trace edema Neuro: Alert and oriented X 3. Psych:  Good affect, responds appropriately   LABS: Basic Metabolic Panel:  Recent Labs  04/15/13 0600 04/16/13 0610  NA 143 142  K 4.0 4.3  CL 101 100  CO2 34* 31  GLUCOSE 127* 79  BUN 21 21  CREATININE 0.85 0.90  CALCIUM 8.9 8.9   Liver Function Tests: No results found for this basename: AST, ALT, ALKPHOS, BILITOT, PROT, ALBUMIN,  in the last 72 hours No results found for this basename: LIPASE, AMYLASE,  in the last 72 hours CBC: No results found for this basename: WBC, NEUTROABS, HGB, HCT, MCV, PLT,  in the last 72 hours Cardiac Enzymes: No results found for this basename: CKTOTAL, CKMB, CKMBINDEX, TROPONINI,  in the last 72 hours BNP: No components found with this basename: POCBNP,  D-Dimer: No results found for this basename: DDIMER,  in the last 72 hours Hemoglobin A1C: No results found for this basename: HGBA1C,  in the last 72 hours Fasting Lipid Panel: No results found for this basename: CHOL, HDL, LDLCALC, TRIG, CHOLHDL,  LDLDIRECT,  in the last 72 hours Thyroid Function Tests: No results found for this basename: TSH, T4TOTAL, FREET3, T3FREE, THYROIDAB,  in the last 72 hours Anemia Panel:  Recent Labs  04/14/13 1105  VITAMINB12 809  FOLATE 19.2  FERRITIN 156  TIBC 529*  IRON 204*  RETICCTPCT 0.7   Coag Panel:   Lab Results  Component Value Date   INR 1.05 04/09/2013    RADIOLOGY: Dg Chest Port 1 View  04/08/2013   CLINICAL DATA:  Dyspnea, cough and congestion.  History of smoking.  EXAM: PORTABLE CHEST - 1 VIEW  COMPARISON:  Chest radiograph and CTA of the chest performed 05/06/2012  FINDINGS: The lungs are mildly hypoexpanded. Vascular congestion is noted, with question of mild interstitial edema. No definite pleural effusion or pneumothorax is seen.  The cardiomediastinal silhouette is enlarged, as on the prior study. No acute osseous abnormalities are seen.  IMPRESSION: Lungs mildly hypoexpanded. Vascular congestion and cardiomegaly, with question of mild interstitial edema.   Electronically Signed   By: Garald Balding M.D.   On: 04/08/2013 03:25   Mr Card Morphology Wo/w Cm  04/14/2013   CLINICAL DATA:  Cardiomyopathy of uncertain etiology  EXAM: CARDIAC MRI  TECHNIQUE: The patient was scanned on a  1.5 Tesla GE magnet. A dedicated cardiac coil was used. Functional imaging was done using Fiesta sequences. 2,3, and 4 chamber views were done to assess for RWMA's. Modified Simpson's rule using a short axis stack was used to calculate an ejection fraction on a dedicated work Conservation officer, nature. The patient received 30 cc of Multihance. After 10 minutes inversion recovery sequences were used to assess for infiltration and scar tissue.  CONTRAST:  30 cc Multihance  MEASUREMENTS: MEASUREMENTS LV EDV 230 mL  LV SV 87 mL  LV EF 38%  FINDINGS: Limited views of the lung fields did not show gross abnormalities. Normal left ventricular size with mild LV hypertrophy. There was moderate global hypokinesis with  EF 38%. Mild left atrial enlargement, normal right atrial size. Normal right ventricular size with mild to moderately decreased systolic function. There did not appear to be significant mitral regurgitation. The aortic valve was trileaflet without significant stenosis or regurgitation.  Delayed enhancement images were difficult. There appeared to be a small discrete area of mid-wall enhancement in the mid inferolateral wall. There was a small discrete area of subendocardial enhancement in the mid anterolateral wall. There was a small discrete area of subepicardial enhancement at the mid inferoseptal RV insertion site.  IMPRESSION: 1. Normal LV size with mild LV hypertrophy. EF 38% with global hypokinesis.  2.  Normal RV size with mild to moderate RV systolic dysfunction.  3. Non-coronary delayed enhancement pattern as described above. This suggests the possibility of an infiltrative disease such as cardiac involvement by sarcoidosis.  Dalton Mclean   Electronically Signed   By: Loralie Champagne M.D.   On: 04/14/2013 18:46   ASSESSMENT AND PLAN:  1.  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 EF 20-25% with RV dilated and systolic fx mod reduced. I/O negative by -11 L but weight is not changing, however he can't stand. Cr stable. He still appears to have some volume on board. On lasix 80 mg IV BID. Placed TED hose. Possible infiltrative disease on MRI can not exclude sarcoid. EF 38%. See cath report. SPEP and UPEP pending. HIV negative and TSH nl.  He was on coreg 25 mg BIDand hydralazine to 25 mg TIDat home.  Currently only on Coreg at 6.25mg  BID with only PRN hydralazine.  Added spironolactone 12.5 mg daily yesterday. Continue ACE-I at current dose.  Hydralazine on hold and Coreg at lower dose due to soft BPs this am.  Dietician consult for HF education. Given his soft BP readings and clear lungs with only trace edema in legs I think he is approaching euvolemia.  I will change  his Lasix to 40mg  PO BID. Chest pain - trop negative, EF 20-25%; no sig CAD at cath, follow  Osteogenesis imperfecta - per IM  2.  HTN (hypertension), malignant - improved control w/ diuresis.  Spironolactone added.   May be reason for NICM  3.  Abdominal bloating -likely related to volume overload.  4.  Suspected OSA (obstructive sleep apnea) / OHS - Using CPAP in the hospital which he reports feeling better with, however complains of dry mucous membranes. Keep using CPAP while here and will need to sch sleep study as OP.  5.  Bradycardia with pauses / ? junctional rhythm - No pauses over 3 seconds, bradycardia at HR < 40 lasts less than 10 seconds, no symptoms, possibly during sleep (after 10 pm yest), follow, continue Coreg for now.  6.  Obesity -  pt has been stress-eating after loss of brother/grandmother  7.  Depression - situational after family deaths. Caused non-compliance w/ diet, possibly medications. Pt would appreciate counseling opportunity, will ask case mgt for options.  8.  ? Wide complex tachycardia at around 3pm yesterday ? Real vs. Artifact - will have EP look at strips tomorrow Still has more fluid to diurese.      Sueanne Margarita, MD  04/16/2013  2:26 PM

## 2013-04-17 LAB — BASIC METABOLIC PANEL
BUN: 21 mg/dL (ref 6–23)
CO2: 28 mEq/L (ref 19–32)
Calcium: 9.1 mg/dL (ref 8.4–10.5)
Chloride: 98 mEq/L (ref 96–112)
Creatinine, Ser: 0.88 mg/dL (ref 0.50–1.35)
GFR calc Af Amer: >90 mL/min (ref >90)
Glucose, Bld: 79 mg/dL (ref 70–99)
POTASSIUM: 4.5 meq/L (ref 3.7–5.3)
SODIUM: 144 meq/L (ref 137–147)

## 2013-04-17 MED ORDER — CARVEDILOL 6.25 MG PO TABS
6.2500 mg | ORAL_TABLET | Freq: Two times a day (BID) | ORAL | Status: DC
  Administered 2013-04-18 – 2013-04-21 (×8): 6.25 mg via ORAL
  Filled 2013-04-17 (×9): qty 1

## 2013-04-17 MED ORDER — FUROSEMIDE 10 MG/ML IJ SOLN
80.0000 mg | Freq: Once | INTRAMUSCULAR | Status: AC
  Administered 2013-04-17: 80 mg via INTRAVENOUS

## 2013-04-17 MED ORDER — LISINOPRIL 10 MG PO TABS
10.0000 mg | ORAL_TABLET | Freq: Two times a day (BID) | ORAL | Status: DC
  Administered 2013-04-17 – 2013-04-19 (×5): 10 mg via ORAL
  Filled 2013-04-17 (×8): qty 1

## 2013-04-17 MED ORDER — CARVEDILOL 12.5 MG PO TABS
12.5000 mg | ORAL_TABLET | Freq: Two times a day (BID) | ORAL | Status: DC
  Administered 2013-04-17: 12.5 mg via ORAL
  Filled 2013-04-17 (×2): qty 1

## 2013-04-17 MED ORDER — FUROSEMIDE 10 MG/ML IJ SOLN
15.0000 mg/h | INTRAVENOUS | Status: DC
  Administered 2013-04-17: 10 mg/h via INTRAVENOUS
  Administered 2013-04-17: 15 mg/h via INTRAVENOUS
  Filled 2013-04-17 (×2): qty 25

## 2013-04-17 MED ORDER — METOLAZONE 5 MG PO TABS
5.0000 mg | ORAL_TABLET | Freq: Once | ORAL | Status: AC
  Administered 2013-04-17: 5 mg via ORAL
  Filled 2013-04-17: qty 1

## 2013-04-17 NOTE — Progress Notes (Signed)
PT Cancellation Note  Patient Details Name: Anuel Quesnel MRN: [id number] DOB: [birth year]   Cancelled Treatment:    Reason Eval/Treat Not Completed: PT screened, no needs identified, will sign off.  Per pt he is at his normal level of mobility getting into and out of the WC.  He has been preforming some exercises from his chair to try to combat getting weak while here in the hospital.  He does not feel he needs PT at this time.  PT to sign off as pt is at his mobility baseline.     Barbarann Ehlers [place], [place], DPT [phone]   04/17/2013, 2:02 PM

## 2013-04-17 NOTE — Care Management Note (Addendum)
  Page 2 of 2   04/20/2013     10:45:50 AM   CARE MANAGEMENT NOTE 04/20/2013  Patient:  Jerry Edwards, Jerry Edwards   Account Number:  [id number]  Date Initiated:  04/12/2013  Documentation initiated by:  Gabriel Earing  Subjective/Objective Assessment:   PT ADMITTED WITH [facility] SOB, CHF     Action/Plan:   FROM HOME   Anticipated DC Date:  04/13/2013   Anticipated DC Plan:  [place]  CM consult      Choice offered to / List presented to:  C-1 Patient           Status of service:  In process, will continue to follow Medicare Important Message given?  NO (If response is "NO", the following Medicare IM given date fields will be blank) Date Medicare IM given:   Date Additional Medicare IM given:    Discharge Disposition:    Per UR Regulation:  Reviewed for med. necessity/level of care/duration of stay  If discussed at [place] of Stay Meetings, dates discussed:    Comments:  04/20/2013 Sleep Study Appointment: [facility] [phone] Appointment:  05/18/2013 - Thursday [name] will mail documents to patient for completion prior to appointment. CM provided patietn with the above  and website information/contact information  printed for patient.  04/19/2013 CPap while IP this admission.  Plan for today:  eval creat on po Lasix. Social:  From home with parents and w/c bound but independent with ADLs. Transportation:  Mother provides IDT:  [facility] - active PT RECS:  Per NOTE: Cancelled Treatment:    Reason Eval/Treat Not Completed: PT screened, no needs identified, will sign off.  Per pt he is at his normal level of mobility getting into and out of the WC.  He has been preforming some exercises from his chair to try to combat getting weak while here in the hospital.  He does not feel he needs PT at this time.  PT to sign off as pt is at his mobility baseline.  Outpatient sleep study recommended d/t O2 sats dropping during  sleep.  Started on CPAP here. Home once SOB better Elects HHS Provided:  [facility] if needed at time of d/c. Disposition Plan: [facility] (New) OP Sleep Study ADD: [address] RN, BSN, [place], CCM [phone] 04/19/2013   04/17/2013 Social:  From home with parents and w/c bound. IDT:  [facility] - active PT RECS:  pending Outpatient sleep study recommended d/t  O2 sats dropping during sleep.  Started on CPAP here. Home once SOB better Disposition: Pending ADD: [phone] RN, BSN, [place], CCM ([phone] 04/17/2013  04/12/13 MMCGIBBONEY, RN, BSN CHART REVIEWED. PLAN TO DC TODAY AFTER CARDIAC CATH.

## 2013-04-17 NOTE — Progress Notes (Signed)
Discussed Ms Jerry Edwards  [facility] PROGRESS NOTE  Jerry Edwards [id number] DOB: 02-18-1971 DOA: 04/08/2013 PCP: [name], MD  Brief narrative  43 y.o. male with Past medical history of osteogenesis imperfecta, hypertension, nonischemic cardiomyopathy presented with SOB and chest pain. admitted for r/o ACS and Acute CHF exacerbation.   Assessment/Plan:  Systolic CHF, acute on chronic  Ace inhibitor, coreg, spironolactone IV lasix, management per cardiology. Cardiac MRI suggestive of infiltrative disease.  SPEP and urine IFE serum pending. Serum ACE ok.  Relative hypotension:  imdur and hydralazine held for relative hypotension. Decreased coreg and lisinopril dose for now  Active Problems:  Bradycardia with pauses / ? Junctional rhythm  Stable On Coreg. Possibly related to OSA. CPAP ordered.   Probable OSA RN reports that patient's oxygen saturations drop when he is sleeping. Also has short sinus pauses on monitor. -had apneic episodes during cath  -Recommend outpatient sleep study. Started on CPAP here.  Osteogenesis imperfecta  recommend outpatient surveillance for osteoporosis.   HTN (hypertension), malignant with HTN heart disease  BP now low  Family Communication: mother 1/24  Disposition Plan: Home once SOB better  Consult: cardiology  Procedures/Studies:  Cardiac cath on 1/21   Antibiotics:  None  HPI/Subjective:  Breathing back to baseline. No weakness or dizziness   Objective: Filed Vitals:   04/17/13 1127  BP: 139/66  Pulse: 84  Temp:   Resp:     Intake/Output Summary (Last 24 hours) at 04/17/13 1417 Last data filed at 04/17/13 1233  Gross per 24 hour  Intake    480 ml  Output    701 ml  Net   -221 ml   Filed Weights   04/15/13 0533 04/16/13 0410 04/17/13 0504  Weight: 94.2 kg (207 lb 10.8 oz) 93.8 kg (206 lb 12.7 oz) 93.2 kg (205 lb 7.5 oz)    Exam:  General: in WC. Breathing nonlabored HEENT: no pallor, moist mucosa   Cardiovascular: RRR, S1/S2 normal , no rubs, no gallops  Respiratory: CTA without WRR Abdomen: Soft/+BS, non tender, non distended,  Extremities: trace edema   Data Reviewed: Basic Metabolic Panel:  Recent Labs Lab 04/13/13 0440 04/14/13 0523 04/15/13 0600 04/16/13 0610 04/17/13 0407  NA 144 143 143 142 144  K 5.0 4.5 4.0 4.3 4.5  CL 104 102 101 100 98  CO2 31 32 34* 31 28  GLUCOSE 99 97 127* 79 79  BUN 25* 18 21 21 21   CREATININE 1.01 0.82 0.85 0.90 0.88  CALCIUM 8.7 8.9 8.9 8.9 9.1   Liver Function Tests: No results found for this basename: AST, ALT, ALKPHOS, BILITOT, PROT, ALBUMIN,  in the last 168 hours No results found for this basename: LIPASE, AMYLASE,  in the last 168 hours No results found for this basename: AMMONIA,  in the last 168 hours CBC:  Recent Labs Lab 04/12/13 0424 04/12/13 1905  WBC 5.7 4.2  HGB 12.0* 12.5*  HCT 41.6 42.4  MCV 94.5 97.0  PLT 288 238   Cardiac Enzymes: No results found for this basename: CKTOTAL, CKMB, CKMBINDEX, TROPONINI,  in the last 168 hours BNP (last 3 results)  Recent Labs  07/11/12 1117 04/08/13 0258 04/10/13 0527  PROBNP 214.0* 6010.0* 1600.0*   CBG: No results found for this basename: GLUCAP,  in the last 168 hours  No results found for this or any previous visit (from the past 240 hour(s)).   Studies: No results found.  Scheduled Meds: . alum & mag hydroxide-simeth  15 mL  Oral TID AC  . carvedilol  6.25 mg Oral BID WC  . enoxaparin (LOVENOX) injection  40 mg Subcutaneous Q24H  . furosemide  40 mg Oral BID  . lisinopril  5 mg Oral BID  . potassium chloride  20 mEq Oral BID  . sodium chloride  3 mL Intravenous Q12H  . spironolactone  12.5 mg Oral Daily   Continuous Infusions:   Time spent: 25 minutes,   [facility] Pager [phone]. If 7PM-7AM, please contact night-coverage at [email or web address], password [facility] 04/17/2013, 2:17 PM  LOS: 9 days

## 2013-04-17 NOTE — Progress Notes (Addendum)
Patient Name: Jerry Edwards Date of Encounter: 04/17/2013  Principal Problem:   Acute on chronic combined systolic and diastolic CHF, NYHA class 4 Active Problems:   Chest pain   Osteogenesis imperfecta   HTN (hypertension), malignant   Abdominal bloating   Suspected OSA (obstructive sleep apnea) / OHS   Bradycardia with pauses / ? junctional rhythm   Pulmonary hypertension   Primary Cardiologist: Dr. Cathie Olden  SUBJECTIVE: Admitted with dyspnea and SOB.   ECHO 04/08/13 ECHO EF 25%    RHC 04/08/13  Right heart catheterization:  RA pressure: Severely elevated at 39/39 with a mean of 34. Prominent X. and Y. Descends.  RV pressure: 76/26 with a right ventricular end-diastolic pressure of 41 mm mercury  PA pressure: 70/36 with a mean of 51  Pulmonary capillary wedge pressure: 31/30 and mean of 29  LV pressure: 140/26 with a left ventricular end-diastolic pressure of 35  PA sat 66%  AO sat 97%  Cardiac output: 4.94 with a cardiac index of 2.63  Pulmonary vascular resistance: 4.45 Woods units   CMRI- EF 38%  possible infiltrative disease. SPEP UPEP pending.   Yesterday he switched to po lasix . Complains of abdominal bloating.   Creatinine 0.8  TIBC 529  Vit B 12 809   OBJECTIVE Filed Vitals:   04/16/13 2138 04/17/13 0244 04/17/13 0504 04/17/13 1127  BP: 118/60  134/68 139/66  Pulse: 80 81 71 84  Temp: 97.8 F (36.6 C)  98 F (36.7 C)   TempSrc: Oral  Oral   Resp: 20 18 19    Height:      Weight:   205 lb 7.5 oz (93.2 kg)   SpO2: 93% 98% 97% 93%    Intake/Output Summary (Last 24 hours) at 04/17/13 1345 Last data filed at 04/17/13 1233  Gross per 24 hour  Intake    480 ml  Output    701 ml  Net   -221 ml   Filed Weights   04/15/13 0533 04/16/13 0410 04/17/13 0504  Weight: 207 lb 10.8 oz (94.2 kg) 206 lb 12.7 oz (93.8 kg) 205 lb 7.5 oz (93.2 kg)    PHYSICAL EXAM General: Well developed, well nourished, male in no acute distress; Sitting on the side of the  bed.  Head: Normocephalic, atraumatic.  Neck: Supple without bruits, JVD difficult to assess secondary to body habitus, but appears elevated.  Lungs:  Resp regular and unlabored, decreased BS bases with some rales.. Heart: RRR, S1, S2, + S3, S4, 2/6 murmur; no rub. Abdomen: Soft, non-tender, +++ distended, BS + x 4.  Extremities: No clubbing, cyanosis, trace  bilateral LE edema.  Neuro: Alert and oriented X 3. Moves all extremities spontaneously. Psych: Normal affect.  LABS: CBC:No results found for this basename: WBC, NEUTROABS, HGB, HCT, MCV, PLT,  in the last 72 hours Basic Metabolic Panel:  Recent Labs  04/16/13 0610 04/17/13 0407  NA 142 144  K 4.3 4.5  CL 100 98  CO2 31 28  GLUCOSE 79 79  BUN 21 21  CREATININE 0.90 0.88  CALCIUM 8.9 9.1   BNP: Pro B Natriuretic peptide (BNP)  Date/Time Value Range Status  04/10/2013  5:27 AM 1600.0* 0 - 125 pg/mL Final  04/08/2013  2:58 AM 6010.0* 0 - 125 pg/mL Final   Thyroid Function Tests:No results found for this basename: TSH, T4TOTAL, FREET3, T3FREE, THYROIDAB,  in the last 72 hours TELE:  SR, sinus brady      Radiology/Studies: No  results found.   Current Medications:  . alum & mag hydroxide-simeth  15 mL Oral TID AC  . carvedilol  6.25 mg Oral BID WC  . enoxaparin (LOVENOX) injection  40 mg Subcutaneous Q24H  . furosemide  40 mg Oral BID  . lisinopril  5 mg Oral BID  . potassium chloride  20 mEq Oral BID  . sodium chloride  3 mL Intravenous Q12H  . spironolactone  12.5 mg Oral Daily   acetaminophen, acetaminophen, hydrALAZINE, HYDROcodone-acetaminophen, ondansetron (ZOFRAN) IV, ondansetron, sodium chloride   ASSESSMENT AND PLAN: 1. - Combined Diastolic and Systolic CHF, acute on chronic w/ pulmonary HTN - NICM. EF 20-25% with RV dilated and systolic fx mod reduced. Possible infiltrative disease, see cath report, recommends eval for infiltrative CM w/ MRI due to restrictive physiology.  cMRI possible infiltrative  disease. SPEP and UPEP pending.  HIV negative and TSH nl.  2. Chest pain - CE negative, resolved.  No sig CAD at cath 3.Osteogenesis imperfecta - per IM 4. HTN (hypertension), malignant - improved control w/ diuresis; as above will add Spiro 5. Abdominal bloating - pt asking for Gaviscon will order, however likely related to volume overload. 6. Suspected OSA (obstructive sleep apnea) / OHS - Using CPAP in the hospital. Needs outpatient sleep study.  7. Obesity -  8. Depression    RHC reviewed with elevated filling pressure noted and poor diuresis over the last few days. Weight has not really changed. Baseline weight should be well below 200 pounds. Volume status levated. Give 80 mg IV lasix then start lasix drip at 15 mg per hour. Continue reduced dose of carvedilol 6.25 mg twice a day. On low dose lisinopril 5 mg daily. Hydralazine/Imdur stopped due to hypotension.   If he has sluggish diuresis overnight will need to move to stepdown for PICC.   Signed, CLEGG,AMY NP-C 1:45 PM  Patient seen and examined with Darrick Grinder, NP. We discussed all aspects of the encounter. I agree with the assessment and plan as stated above.   Difficult situation. I reviewed recent [place] numbers which showed markedly elevated biventricular filling pressures. His weight has not changed despite attempts at diuresis. Although exam isn't suggestive of marked volume overload, his weight is up nearly 40 pounds from this summer. Agree with lasix drip and adding metolazone. If no response, may be worthwhile to place PICC line and transfer to SDU for management with co-ox and CVPs. May need endomyocardial biopsy at some point.   Daniel Bensimhon,MD 6:30 PM

## 2013-04-18 ENCOUNTER — Inpatient Hospital Stay (HOSPITAL_COMMUNITY): Payer: Medicare Other

## 2013-04-18 LAB — IGG, IGA, IGM
IGA: 717 mg/dL — AB (ref 68–379)
IGG (IMMUNOGLOBIN G), SERUM: 1760 mg/dL — AB (ref 650–1600)
IGM, SERUM: 147 mg/dL (ref 41–251)

## 2013-04-18 LAB — PROTEIN ELECTROPH W RFLX QUANT IMMUNOGLOBULINS
ALPHA-2-GLOBULIN: 11.8 % (ref 7.1–11.8)
Albumin ELP: 45.8 % — ABNORMAL LOW (ref 55.8–66.1)
Alpha-1-Globulin: 4.6 % (ref 2.9–4.9)
Beta 2: 7.6 % — ABNORMAL HIGH (ref 3.2–6.5)
Beta Globulin: 9 % — ABNORMAL HIGH (ref 4.7–7.2)
Gamma Globulin: 21.2 % — ABNORMAL HIGH (ref 11.1–18.8)
M-SPIKE, %: NOT DETECTED g/dL
Total Protein ELP: 7.3 g/dL (ref 6.0–8.3)

## 2013-04-18 LAB — BASIC METABOLIC PANEL
BUN: 26 mg/dL — AB (ref 6–23)
CHLORIDE: 92 meq/L — AB (ref 96–112)
CO2: 32 meq/L (ref 19–32)
Calcium: 9.5 mg/dL (ref 8.4–10.5)
Creatinine, Ser: 1.04 mg/dL (ref 0.50–1.35)
GFR calc non Af Amer: 86 mL/min — ABNORMAL LOW (ref >90)
Glucose, Bld: 84 mg/dL (ref 70–99)
POTASSIUM: 3.5 meq/L — AB (ref 3.7–5.3)
Sodium: 140 mEq/L (ref 137–147)

## 2013-04-18 LAB — IMMUNOFIXATION ADD-ON

## 2013-04-18 LAB — GLUCOSE, CAPILLARY: Glucose-Capillary: 112 mg/dL — ABNORMAL HIGH (ref 70–99)

## 2013-04-18 MED ORDER — FUROSEMIDE 10 MG/ML IJ SOLN
10.0000 mg/h | INTRAVENOUS | Status: DC
  Administered 2013-04-18 (×2): 10 mg/h via INTRAVENOUS
  Filled 2013-04-18 (×2): qty 25

## 2013-04-18 MED ORDER — POTASSIUM CHLORIDE CRYS ER 20 MEQ PO TBCR
20.0000 meq | EXTENDED_RELEASE_TABLET | ORAL | Status: DC
  Administered 2013-04-18: 20 meq via ORAL

## 2013-04-18 MED ORDER — POTASSIUM CHLORIDE 20 MEQ/15ML (10%) PO LIQD
20.0000 meq | Freq: Once | ORAL | Status: AC
  Administered 2013-04-18: 20 meq via ORAL
  Filled 2013-04-18: qty 15

## 2013-04-18 MED ORDER — POTASSIUM CHLORIDE 20 MEQ/15ML (10%) PO LIQD
40.0000 meq | Freq: Two times a day (BID) | ORAL | Status: DC
  Administered 2013-04-18 – 2013-04-19 (×3): 40 meq via ORAL
  Filled 2013-04-18 (×4): qty 30

## 2013-04-18 NOTE — Progress Notes (Addendum)
Patient Name: Jerry Edwards Date of Encounter: 04/18/2013  Principal Problem:   Acute on chronic combined systolic and diastolic CHF, NYHA class 4 Active Problems:   Chest pain   Osteogenesis imperfecta   HTN (hypertension), malignant   Abdominal bloating   Suspected OSA (obstructive sleep apnea) / OHS   Bradycardia with pauses / ? junctional rhythm   Pulmonary hypertension   Primary Cardiologist: Dr. Cathie Olden  SUBJECTIVE: Admitted with dyspnea and SOB.   ECHO 04/08/13 ECHO EF 25%    RHC 04/08/13  Right heart catheterization:  RA pressure: Severely elevated at 39/39 with a mean of 34. Prominent X. and Y. Descends.  RV pressure: 76/26 with a right ventricular end-diastolic pressure of 41 mm mercury  PA pressure: 70/36 with a mean of 51  Pulmonary capillary wedge pressure: 31/30 and mean of 29  LV pressure: 140/26 with a left ventricular end-diastolic pressure of 35  PA sat 66%  AO sat 97%  Cardiac output: 4.94 with a cardiac index of 2.63  Pulmonary vascular resistance: 4.45 Woods units   CMRI- EF 38%  possible infiltrative disease. SPEP UPEP pending.   Yesterday he switched from po lasix to Lasix drip at 15 mg per hour. Weight down 15 pounds?  24 hour I/O - 2.1 liters.  Complaining of lower extremity cramps. Denies SOB.  Creatinine 0.8> 1.04  K 3.5  TIBC 529  Vit B 12 809   OBJECTIVE Filed Vitals:   04/17/13 2159 04/18/13 0023 04/18/13 0447 04/18/13 0619  BP: 86/43  117/64 110/80  Pulse: 85 77 77 85  Temp: 97.4 F (36.3 C)  98 F (36.7 C) 98.5 F (36.9 C)  TempSrc: Oral  Oral Oral  Resp: 18 18 20 20   Height:      Weight:    190 lb 14.7 oz (86.6 kg)  SpO2: 97% 97% 91% 92%    Intake/Output Summary (Last 24 hours) at 04/18/13 0750 Last data filed at 04/18/13 0723  Gross per 24 hour  Intake    850 ml  Output   3150 ml  Net  -2300 ml   Filed Weights   04/16/13 0410 04/17/13 0504 04/18/13 0619  Weight: 206 lb 12.7 oz (93.8 kg) 205 lb 7.5 oz (93.2 kg)  190 lb 14.7 oz (86.6 kg)    PHYSICAL EXAM General: Well developed, well nourished, male in no acute distress; Lying in bed.   Head: Normocephalic, atraumatic.  Neck: Supple without bruits, JVD difficult to assess secondary to body habitus, but about 8-9.  Lungs:  Resp regular and unlabored, decreased BS bases. Heart: RRR, S1, S2, + S3, S4, 2/6 murmur; no rub. Abdomen: Soft, non-tender, + distended, BS + x 4.  Extremities: No clubbing, cyanosis, trace  bilateral LE edema.  Neuro: Alert and oriented X 3. Moves all extremities spontaneously. Psych: Normal affect.  LABS: CBC:No results found for this basename: WBC, NEUTROABS, HGB, HCT, MCV, PLT,  in the last 72 hours Basic Metabolic Panel:  Recent Labs  04/17/13 0407 04/18/13 0511  NA 144 140  K 4.5 3.5*  CL 98 92*  CO2 28 32  GLUCOSE 79 84  BUN 21 26*  CREATININE 0.88 1.04  CALCIUM 9.1 9.5   BNP: Pro B Natriuretic peptide (BNP)  Date/Time Value Range Status  04/10/2013  5:27 AM 1600.0* 0 - 125 pg/mL Final  04/08/2013  2:58 AM 6010.0* 0 - 125 pg/mL Final   Thyroid Function Tests:No results found for this basename: TSH, T4TOTAL, FREET3,  T3FREE, THYROIDAB,  in the last 72 hours TELE:  SR, sinus brady      Radiology/Studies: No results found.   Current Medications:  . alum & mag hydroxide-simeth  15 mL Oral TID AC  . carvedilol  6.25 mg Oral BID WC  . enoxaparin (LOVENOX) injection  40 mg Subcutaneous Q24H  . lisinopril  10 mg Oral BID  . potassium chloride  20 mEq Oral BID  . sodium chloride  3 mL Intravenous Q12H  . spironolactone  12.5 mg Oral Daily   acetaminophen, acetaminophen, hydrALAZINE, HYDROcodone-acetaminophen, ondansetron (ZOFRAN) IV, ondansetron, sodium chloride   ASSESSMENT AND PLAN: 1. - Combined Diastolic and Systolic CHF, acute on chronic w/ pulmonary HTN - NICM. EF 20-25% with RV dilated and systolic fx mod reduced. Possible infiltrative disease, see cath report, recommends eval for infiltrative CM  w/ MRI due to restrictive physiology.  cMRI possible infiltrative disease. SPEP and UPEP pending.  HIV negative and TSH nl.  Volume status improving. Weight down 15 pounds.Cut back lasix drip to 10 mg per hour likely transition to po tomorrow. . Renal function ok. Will CT of chest ? Sarcoid. Supplement potassium. Continue current dose of  2. Chest pain - CE negative, resolved.  No sig CAD at cath 3.Osteogenesis imperfecta - per IM 4. HTN (hypertension), malignant - improved control w/ diuresis; as above will add Spiro 5. Abdominal bloating - pt asking for Gaviscon will order, however likely related to volume overload. 6. Suspected OSA (obstructive sleep apnea) / OHS - Using CPAP in the hospital. Needs outpatient sleep study.  7. Obesity 8. Depression    Signed, CLEGG,AMY NP-C 7:50 AM  Patient seen with NP, agree with the above note.  He probably still has some volume overload.   - Lasix 10 mg/hr today, replace K and Mg.  Probably transition to po tomorrow.  - Increase spironolactone to 25 mg daily, other meds the same.  - Given concern for infiltrative disease on cMRI with somewhat discrete delayed enhancement nodules, will get a noncontrast chest CT to assess for evidence of sarcoidosis.   Loralie Champagne 04/18/2013 8:07 AM

## 2013-04-18 NOTE — Progress Notes (Signed)
[facility] PROGRESS NOTE  Jerry Edwards [id number] DOB: Mar 05, 1971 DOA: 04/08/2013 PCP: [name], MD  Brief narrative  43 y.o. male with Past medical history of osteogenesis imperfecta, hypertension, nonischemic cardiomyopathy presented with SOB and chest pain. admitted for r/o ACS and Acute CHF exacerbation.   Assessment/Plan:  Systolic CHF, acute on chronic  Ace inhibitor, coreg, spironolactone, lasix gtt, management per cardiology. Cardiac MRI suggestive of infiltrative disease. Serum ACE ok and CT chest shows no evidence of sarcoidosis. Might eventually need bx.  Bradycardia with pauses / ? Junctional rhythm  Stable On Coreg. Possibly related to OSA. CPAP ordered.   Probable OSA RN reports that patient's oxygen saturations drop when he is sleeping. Also has short sinus pauses on monitor. -had apneic episodes during cath  -Recommend outpatient sleep study. Started on CPAP here.  Osteogenesis imperfecta  recommend outpatient surveillance for osteoporosis.   HTN (hypertension), malignant with HTN heart disease  controlled  Family Communication: mother 1/24  Disposition Plan: home  Consult: cardiology  Procedures/Studies:  Cardiac cath on 1/21   Antibiotics:  None  HPI/Subjective:  Breathing back to baseline. No weakness or dizziness   Objective: Filed Vitals:   04/18/13 1658  BP: 105/65  Pulse: 65  Temp:   Resp: 16    Intake/Output Summary (Last 24 hours) at 04/18/13 1745 Last data filed at 04/18/13 1540  Gross per 24 hour  Intake   1000 ml  Output   3600 ml  Net  -2600 ml   Filed Weights   04/16/13 0410 04/17/13 0504 04/18/13 0619  Weight: 93.8 kg (206 lb 12.7 oz) 93.2 kg (205 lb 7.5 oz) 86.6 kg (190 lb 14.7 oz)    Exam:  General: in WC. Breathing nonlabored HEENT: no pallor, moist mucosa  Cardiovascular: RRR, S1/S2 normal , no rubs, no gallops  Respiratory: CTA without WRR Abdomen: Soft/+BS, non tender, non distended,   Extremities: trace edema   Data Reviewed: Basic Metabolic Panel:  Recent Labs Lab 04/14/13 0523 04/15/13 0600 04/16/13 0610 04/17/13 0407 04/18/13 0511  NA 143 143 142 144 140  K 4.5 4.0 4.3 4.5 3.5*  CL 102 101 100 98 92*  CO2 32 34* 31 28 32  GLUCOSE 97 127* 79 79 84  BUN 18 21 21 21  26*  CREATININE 0.82 0.85 0.90 0.88 1.04  CALCIUM 8.9 8.9 8.9 9.1 9.5   Liver Function Tests: No results found for this basename: AST, ALT, ALKPHOS, BILITOT, PROT, ALBUMIN,  in the last 168 hours No results found for this basename: LIPASE, AMYLASE,  in the last 168 hours No results found for this basename: AMMONIA,  in the last 168 hours CBC:  Recent Labs Lab 04/12/13 0424 04/12/13 1905  WBC 5.7 4.2  HGB 12.0* 12.5*  HCT 41.6 42.4  MCV 94.5 97.0  PLT 288 238   Cardiac Enzymes: No results found for this basename: CKTOTAL, CKMB, CKMBINDEX, TROPONINI,  in the last 168 hours BNP (last 3 results)  Recent Labs  07/11/12 1117 04/08/13 0258 04/10/13 0527  PROBNP 214.0* 6010.0* 1600.0*   CBG:  Recent Labs Lab 04/18/13 1458  GLUCAP 112*    No results found for this or any previous visit (from the past 240 hour(s)).   Studies: Ct Chest Wo Contrast  04/18/2013   CLINICAL DATA:  Concern for pulmonary infiltrate  EXAM: CT CHEST WITHOUT CONTRAST  TECHNIQUE: Multidetector CT imaging of the chest was performed following the standard protocol without IV contrast.  COMPARISON:  MR CARD MORPHOLOGY  WO/W CM dated 04/14/2013; CT ANGIO CHEST W/CM &/OR WO/CM dated 05/06/2012  FINDINGS: Noncontrast evaluation of the thoracic inlet is unremarkable.  Multichamber cardiac enlargement is appreciated. Subcentimeter lymph prevascular and right paratracheal lymph nodes are identified. Within the limitations of a noncontrast CT there is no evidence of appreciable pathologic sized adenopathy.  The lung parenchyma demonstrates no evidence of focal infiltrates, nor focal regions of consolidation. There are  minimal areas of pleural thickening within the dependent bases of the lungs. There is no evidence of pulmonary nodules nor masses. The central airways are patent.  Visualized upper abdominal viscera demonstrate no gross abnormalities.  Multilevel central endplate depressions and of multilevel areas of vertebral body height loss are appreciated throughout the thoracic and lumbar spine. There does not appear to be CT evidence of aggressive appearing osseous lesions.  IMPRESSION: 1. No CT evidence of pulmonary infiltrates or regions of consolidation. 2. Within the limitations of a noncontrast CT there is no evidence of significant mediastinal adenopathy or masses. Multichamber cardiac enlargement is identified.   Electronically Signed   By: Margaree Mackintosh M.D.   On: 04/18/2013 12:54    Scheduled Meds: . alum & mag hydroxide-simeth  15 mL Oral TID AC  . carvedilol  6.25 mg Oral BID WC  . enoxaparin (LOVENOX) injection  40 mg Subcutaneous Q24H  . lisinopril  10 mg Oral BID  . potassium chloride  40 mEq Oral BID  . sodium chloride  3 mL Intravenous Q12H  . spironolactone  12.5 mg Oral Daily   Continuous Infusions: . furosemide (LASIX) infusion 10 mg/hr (04/18/13 1344)    Time spent: 15 minutes,   [facility] Pager [phone]. If 7PM-7AM, please contact night-coverage at [email or web address], password [facility] 04/18/2013, 5:45 PM  LOS: 10 days

## 2013-04-18 NOTE — Progress Notes (Signed)
I visited with Jerry Edwards and reviewed the Heart Failure diagnosis.  He verbalizes some understanding of the diagnosis.  We reviewed heart failure education including signs and symptoms of heart failure, when to call physician, importance of daily weights, taking medications as prescribed, low sodium diet and fluid restriction.  He acknowledges understanding of all of the above.  He says that he had some social issues (deaths in the family) that have "derailed" him.  He is being followed by the Heart Failure team here in the hospital.  I will plan to see him prior to discharge to reinforce teaching.  Carole Binning RN, BSN, PCCN--Heart Failure Navigator

## 2013-04-18 NOTE — Progress Notes (Signed)
1500 Pt complaint feeling dizzy Sitting comfortably n wheelchair eating lunch VS noted and charted BS =112 skin and warm to touch . Leg cramping eased up with PRN given earlier . Continue monitoring done

## 2013-04-19 DIAGNOSIS — N179 Acute kidney failure, unspecified: Secondary | ICD-10-CM

## 2013-04-19 LAB — IMMUNOFIXATION, URINE

## 2013-04-19 LAB — BASIC METABOLIC PANEL
BUN: 44 mg/dL — ABNORMAL HIGH (ref 6–23)
CO2: 31 mEq/L (ref 19–32)
CREATININE: 1.66 mg/dL — AB (ref 0.50–1.35)
Calcium: 9.3 mg/dL (ref 8.4–10.5)
Chloride: 93 mEq/L — ABNORMAL LOW (ref 96–112)
GFR, EST AFRICAN AMERICAN: 57 mL/min — AB (ref >90)
GFR, EST NON AFRICAN AMERICAN: 49 mL/min — AB (ref >90)
Glucose, Bld: 99 mg/dL (ref 70–99)
POTASSIUM: 4.3 meq/L (ref 3.7–5.3)
Sodium: 141 mEq/L (ref 137–147)

## 2013-04-19 NOTE — Progress Notes (Signed)
I cosign with Talmadge Chad Student RN on all assessments, notes, I/O, and medication passes for this shift. Ronnette Hila, RN

## 2013-04-19 NOTE — Progress Notes (Addendum)
[facility] PROGRESS NOTE  Jerry Edwards [id number] DOB: [birth year] DOA: 04/08/2013 PCP: [name], MD  Brief narrative  43 y.o. male with Past medical history of osteogenesis imperfecta, hypertension, nonischemic cardiomyopathy presented with SOB and chest pain. admitted for r/o ACS and Acute CHF exacerbation.   Assessment/Plan:  Systolic CHF, acute on chronic  -Ace inhibitor and coreg on board -cardiology providing rec's on diuretic regimen -Cr has now bumped to 1.66; plan is for diuretic holiday today and transition base on Cr level to PO regimen tomorrow -no SOB and no crackles -patient with 15 pounds off approx since admission. -Cardiac MRI suggestive of infiltrative disease. Serum ACE ok and CT chest shows no evidence of sarcoidosis. Will need biopsy of endocardium as an outpatient.  Bradycardia with pauses / ? Junctional rhythm  -Possibly related to OSA. CPAP ordered.  -continue low dose coreg  Probable OSA -RN reports that patient's oxygen saturations drop when he is sleeping. Also has short sinus pauses on monitor. -positive apneic episodes during cath  -Recommend outpatient sleep study.  -Started on CPAP with auto-mode while inpatient.  ARF -secondary to diuresis and nephrotoxic agents -close monitoring of renal failure -hold lasix and aldactone today -if levels continue going up with stop lisinopril  Osteogenesis imperfecta  -will recommend outpatient surveillance for osteoporosis.   HTN (hypertension), malignant with HTN heart disease  -Well controlled currently -will monitor  Family Communication: mother 1/24  Disposition Plan: home  Consult: cardiology  Procedures/Studies:  Cardiac cath on 1/21   Antibiotics:  None  HPI/Subjective:  Breathing a whole lot better. Now 15 pounds lighter since admission. No CP.   Objective: Filed Vitals:   04/19/13 2132  BP: 106/63  Pulse: 77  Temp: 98.3 F (36.8 C)  Resp: 20     Intake/Output Summary (Last 24 hours) at 04/19/13 2228 Last data filed at 04/19/13 1618  Gross per 24 hour  Intake   1113 ml  Output    875 ml  Net    238 ml   Filed Weights   04/17/13 0504 04/18/13 0619 04/19/13 0530  Weight: 93.2 kg (205 lb 7.5 oz) 86.6 kg (190 lb 14.7 oz) 86.6 kg (190 lb 14.7 oz)    Exam:  General: on side of bed having lunch, no acute distress, breathing better. Afebrile  HEENT: no pallor, moist mucosa  Cardiovascular: RRR, S1/S2 normal , no rubs, no gallops, soft SEM  Respiratory: CTA without wheezing, rales or crackles Abdomen: Soft/+BS, non tender, non distended,  Extremities: trace edema   Data Reviewed: Basic Metabolic Panel:  Recent Labs Lab 04/15/13 0600 04/16/13 0610 04/17/13 0407 04/18/13 0511 04/19/13 0609  NA 143 142 144 140 141  K 4.0 4.3 4.5 3.5* 4.3  CL 101 100 98 92* 93*  CO2 34* 31 28 32 31  GLUCOSE 127* 79 79 84 99  BUN 21 21 21  26* 44*  CREATININE 0.85 0.90 0.88 1.04 1.66*  CALCIUM 8.9 8.9 9.1 9.5 9.3    BNP (last 3 results)  Recent Labs  07/11/12 1117 04/08/13 0258 04/10/13 0527  PROBNP 214.0* 6010.0* 1600.0*   CBG:  Recent Labs Lab 04/18/13 1458  GLUCAP 112*    Studies: Ct Chest Wo Contrast  04/18/2013   CLINICAL DATA:  Concern for pulmonary infiltrate  EXAM: CT CHEST WITHOUT CONTRAST  TECHNIQUE: Multidetector CT imaging of the chest was performed following the standard protocol without IV contrast.  COMPARISON:  MR CARD MORPHOLOGY WO/W CM dated 04/14/2013; CT ANGIO CHEST W/CM &/OR  WO/CM dated 05/06/2012  FINDINGS: Noncontrast evaluation of the thoracic inlet is unremarkable.  Multichamber cardiac enlargement is appreciated. Subcentimeter lymph prevascular and right paratracheal lymph nodes are identified. Within the limitations of a noncontrast CT there is no evidence of appreciable pathologic sized adenopathy.  The lung parenchyma demonstrates no evidence of focal infiltrates, nor focal regions of  consolidation. There are minimal areas of pleural thickening within the dependent bases of the lungs. There is no evidence of pulmonary nodules nor masses. The central airways are patent.  Visualized upper abdominal viscera demonstrate no gross abnormalities.  Multilevel central endplate depressions and of multilevel areas of vertebral body height loss are appreciated throughout the thoracic and lumbar spine. There does not appear to be CT evidence of aggressive appearing osseous lesions.  IMPRESSION: 1. No CT evidence of pulmonary infiltrates or regions of consolidation. 2. Within the limitations of a noncontrast CT there is no evidence of significant mediastinal adenopathy or masses. Multichamber cardiac enlargement is identified.   Electronically Signed   By: Margaree Mackintosh M.D.   On: 04/18/2013 12:54    Scheduled Meds: . alum & mag hydroxide-simeth  15 mL Oral TID AC  . carvedilol  6.25 mg Oral BID WC  . enoxaparin (LOVENOX) injection  40 mg Subcutaneous Q24H  . lisinopril  10 mg Oral BID  . sodium chloride  3 mL Intravenous Q12H   Continuous Infusions:    Time spent: <30 minutes,   Letta Cargile  [facility] Pager [phone]. If 7PM-7AM, please contact night-coverage at [email or web address], password [facility] 04/19/2013, 10:28 PM  LOS: 11 days

## 2013-04-19 NOTE — Progress Notes (Signed)
UR completed Chantee Cerino K. Emori Mumme, RN, BSN, [place], CCM  04/19/2013 5:21 PM

## 2013-04-19 NOTE — Progress Notes (Addendum)
Patient Name: Jerry Edwards Date of Encounter: 04/19/2013  Principal Problem:   Acute on chronic combined systolic and diastolic CHF, NYHA class 4 Active Problems:   Chest pain   Osteogenesis imperfecta   HTN (hypertension), malignant   Abdominal bloating   Suspected OSA (obstructive sleep apnea) / OHS   Bradycardia with pauses / ? junctional rhythm   Pulmonary hypertension   Primary Cardiologist: Dr. Cathie Olden  SUBJECTIVE: Admitted with dyspnea and SOB.   ECHO 04/08/13 ECHO EF 25%    RHC 04/08/13  Right heart catheterization:  RA pressure: Severely elevated at 39/39 with a mean of 34. Prominent X. and Y. Descends.  RV pressure: 76/26 with a right ventricular end-diastolic pressure of 41 mm mercury  PA pressure: 70/36 with a mean of 51  Pulmonary capillary wedge pressure: 31/30 and mean of 29  LV pressure: 140/26 with a left ventricular end-diastolic pressure of 35  PA sat 66%  AO sat 97%  Cardiac output: 4.94 with a cardiac index of 2.63  Pulmonary vascular resistance: 4.45 Woods units   CMRI- EF 38%  possible infiltrative disease. SPEP UPEP pending.  CT of chest-no evidence of pulmonary infiltrates.   Yesterday he Lasix drip was cut back to 10 mg  per hour. Weight unchanged. Overall his weight is down 19 pounds.  Denies SOB.  Creatinine 0.8> 1.04 >1.6 K 3.5 >4.3  OBJECTIVE Filed Vitals:   04/18/13 2153 04/18/13 2323 04/19/13 0128 04/19/13 0530  BP: 110/84 116/74 114/78 94/57  Pulse: 76 74 81 63  Temp: 98 F (36.7 C)  98.2 F (36.8 C) 97.9 F (36.6 C)  TempSrc: Oral  Oral Oral  Resp: 17  20 18   Height:      Weight:    190 lb 14.7 oz (86.6 kg)  SpO2: 90%  100% 92%    Intake/Output Summary (Last 24 hours) at 04/19/13 0745 Last data filed at 04/19/13 0659  Gross per 24 hour  Intake    900 ml  Output    975 ml  Net    -75 ml   Filed Weights   04/17/13 0504 04/18/13 0619 04/19/13 0530  Weight: 205 lb 7.5 oz (93.2 kg) 190 lb 14.7 oz (86.6 kg) 190 lb 14.7  oz (86.6 kg)    PHYSICAL EXAM General: Well developed, well nourished, male in no acute distress; Lying in bed.   Head: Normocephalic, atraumatic.  Neck: Supple without bruits, JVD difficult to assess secondary to body habitus, but about 8-9.  Lungs:  Resp regular and unlabored, Clear. Heart: RRR, S1, S2, + S3, S4, 2/6 murmur; no rub. Abdomen: Soft, non-tender, + distended, BS + x 4.  Extremities: No clubbing, cyanosis, trace  bilateral LE edema.  Neuro: Alert and oriented X 3. Moves all extremities spontaneously. Psych: Normal affect.  LABS: CBC:No results found for this basename: WBC, NEUTROABS, HGB, HCT, MCV, PLT,  in the last 72 hours Basic Metabolic Panel:  Recent Labs  04/18/13 0511 04/19/13 0609  NA 140 141  K 3.5* 4.3  CL 92* 93*  CO2 32 31  GLUCOSE 84 99  BUN 26* 44*  CREATININE 1.04 1.66*  CALCIUM 9.5 9.3   BNP: Pro B Natriuretic peptide (BNP)  Date/Time Value Range Status  04/10/2013  5:27 AM 1600.0* 0 - 125 pg/mL Final  04/08/2013  2:58 AM 6010.0* 0 - 125 pg/mL Final   Thyroid Function Tests:No results found for this basename: TSH, T4TOTAL, FREET3, T3FREE, THYROIDAB,  in the last 72  hours TELE:  SR, sinus brady      Radiology/Studies: Ct Chest Wo Contrast  04/18/2013   CLINICAL DATA:  Concern for pulmonary infiltrate  EXAM: CT CHEST WITHOUT CONTRAST  TECHNIQUE: Multidetector CT imaging of the chest was performed following the standard protocol without IV contrast.  COMPARISON:  MR CARD MORPHOLOGY WO/W CM dated 04/14/2013; CT ANGIO CHEST W/CM &/OR WO/CM dated 05/06/2012  FINDINGS: Noncontrast evaluation of the thoracic inlet is unremarkable.  Multichamber cardiac enlargement is appreciated. Subcentimeter lymph prevascular and right paratracheal lymph nodes are identified. Within the limitations of a noncontrast CT there is no evidence of appreciable pathologic sized adenopathy.  The lung parenchyma demonstrates no evidence of focal infiltrates, nor focal regions of  consolidation. There are minimal areas of pleural thickening within the dependent bases of the lungs. There is no evidence of pulmonary nodules nor masses. The central airways are patent.  Visualized upper abdominal viscera demonstrate no gross abnormalities.  Multilevel central endplate depressions and of multilevel areas of vertebral body height loss are appreciated throughout the thoracic and lumbar spine. There does not appear to be CT evidence of aggressive appearing osseous lesions.  IMPRESSION: 1. No CT evidence of pulmonary infiltrates or regions of consolidation. 2. Within the limitations of a noncontrast CT there is no evidence of significant mediastinal adenopathy or masses. Multichamber cardiac enlargement is identified.   Electronically Signed   By: Margaree Mackintosh M.D.   On: 04/18/2013 12:54     Current Medications:  . alum & mag hydroxide-simeth  15 mL Oral TID AC  . carvedilol  6.25 mg Oral BID WC  . enoxaparin (LOVENOX) injection  40 mg Subcutaneous Q24H  . lisinopril  10 mg Oral BID  . potassium chloride  40 mEq Oral BID  . sodium chloride  3 mL Intravenous Q12H  . spironolactone  12.5 mg Oral Daily   acetaminophen, acetaminophen, hydrALAZINE, HYDROcodone-acetaminophen, ondansetron (ZOFRAN) IV, ondansetron, sodium chloride   ASSESSMENT AND PLAN: 1. - Combined Diastolic and Systolic CHF, acute on chronic w/ pulmonary HTN - NICM. EF 20-25% with RV dilated and systolic fx mod reduced. Possible infiltrative disease, see cath report, recommends eval for infiltrative CM w/ MRI due to restrictive physiology.  cMRI possible infiltrative disease. SPEP and UPEP pending.  HIV negative and TSH nl.CT of chest no evidence of pulmonary infiltrates.   Volume status low.  Weight unchanged. Overall weight down 19 pounds. Creatinine bump noted 1.0>1.6 . Stop lasix drip. Hold diuretics today to include spironolactone.  CT of chest no evidence of pulmonary infiltrates.  Continue current dose of  carvedilol and lisinopril .  2. Chest pain - CE negative, resolved.  No sig CAD at cath 3.Osteogenesis imperfecta - per IM 4. HTN (hypertension), malignant - Soft this am.  5. Abdominal bloating - resolved  6. Suspected OSA (obstructive sleep apnea) / OHS - Using CPAP in the hospital. Needs outpatient sleep study.  7. Obesity 8. Depression  9. Acute kidney injury likely due to overdiuresis  Likely home tomorrow if renal function improves.   CLEGG,AMY NP-C 7:45 AM   Patient seen and examined with Darrick Grinder, NP. We discussed all aspects of the encounter. I agree with the assessment and plan as stated above. He looks dry. Renal function worse. Hold lasix today. If renal function better can go home tomorrow. New dry weight 190-195. Counseled on fluid restriction. CT chest reviewed personally no evidence sarcoid. Can consider endomyocardial biopsy as outpatient if needed.   Daniel Bensimhon,MD 8:16  AM

## 2013-04-20 LAB — BASIC METABOLIC PANEL
BUN: 58 mg/dL — ABNORMAL HIGH (ref 6–23)
CALCIUM: 9.7 mg/dL (ref 8.4–10.5)
CO2: 35 mEq/L — ABNORMAL HIGH (ref 19–32)
Chloride: 93 mEq/L — ABNORMAL LOW (ref 96–112)
Creatinine, Ser: 1.78 mg/dL — ABNORMAL HIGH (ref 0.50–1.35)
GFR, EST AFRICAN AMERICAN: 52 mL/min — AB (ref >90)
GFR, EST NON AFRICAN AMERICAN: 45 mL/min — AB (ref >90)
Glucose, Bld: 89 mg/dL (ref 70–99)
Potassium: 5.1 mEq/L (ref 3.7–5.3)
Sodium: 139 mEq/L (ref 137–147)

## 2013-04-20 NOTE — Progress Notes (Signed)
Patient Name: Jerry Edwards Date of Encounter: 04/20/2013  Principal Problem:   Acute on chronic combined systolic and diastolic CHF, NYHA class 4 Active Problems:   Chest pain   Osteogenesis imperfecta   HTN (hypertension), malignant   Abdominal bloating   Suspected OSA (obstructive sleep apnea) / OHS   Bradycardia with pauses / ? junctional rhythm   Pulmonary hypertension   Primary Cardiologist: Dr. Cathie Olden  SUBJECTIVE: Admitted with dyspnea and SOB.   ECHO 04/08/13 ECHO EF 25%    RHC 04/08/13  Right heart catheterization:  RA pressure: Severely elevated at 39/39 with a mean of 34. Prominent X. and Y. Descends.  RV pressure: 76/26 with a right ventricular end-diastolic pressure of 41 mm mercury  PA pressure: 70/36 with a mean of 51  Pulmonary capillary wedge pressure: 31/30 and mean of 29  LV pressure: 140/26 with a left ventricular end-diastolic pressure of 35  PA sat 66%  AO sat 97%  Cardiac output: 4.94 with a cardiac index of 2.63  Pulmonary vascular resistance: 4.45 Woods units   CMRI- EF 38%  possible infiltrative disease. SPEP UPEP pending.  CT of chest-no evidence of pulmonary infiltrates.   Yesterday he Lasix drip was cut back to 10 mg  per hour. Weight unchanged. Overall his weight is down 19 pounds.  Denies SOB.  Creatinine 0.8> 1.04 >1.6>1.7 K 3.5 >4.3>5.1  OBJECTIVE Filed Vitals:   04/19/13 0700 04/19/13 1300 04/19/13 2132 04/20/13 0628  BP: 103/64 118/68 106/63 104/61  Pulse: 82 76 77 73  Temp: 98.2 F (36.8 C) 97.9 F (36.6 C) 98.3 F (36.8 C) 98.5 F (36.9 C)  TempSrc: Oral Oral Oral Oral  Resp: 20 20 20 20   Height:      Weight:    194 lb 1.6 oz (88.043 kg)  SpO2: 92% 99% 98% 96%    Intake/Output Summary (Last 24 hours) at 04/20/13 0746 Last data filed at 04/20/13 [id number]  Gross per 24 hour  Intake    843 ml  Output    700 ml  Net    143 ml   Filed Weights   04/18/13 0619 04/19/13 0530 04/20/13 0628  Weight: 190 lb 14.7 oz (86.6  kg) 190 lb 14.7 oz (86.6 kg) 194 lb 1.6 oz (88.043 kg)    PHYSICAL EXAM General: Well developed, well nourished, male in no acute distress; Lying in bed. With CPAP on    Head: Normocephalic, atraumatic.  Neck: Supple without bruits, JVD difficult to assess secondary to body habitus, but about 8-9.  Lungs:  Resp regular and unlabored, Clear. Heart: RRR, S1, S2, + S3, S4, 2/6 murmur; no rub. Abdomen: Soft, non-tender, + distended, BS + x 4.  Extremities: No clubbing, cyanosis, trace  bilateral LE edema.  Neuro: Alert and oriented X 3. Moves all extremities spontaneously. Psych: Normal affect.  LABS: CBC:No results found for this basename: WBC, NEUTROABS, HGB, HCT, MCV, PLT,  in the last 72 hours Basic Metabolic Panel:  Recent Labs  04/19/13 0609 04/20/13 0540  NA 141 139  K 4.3 5.1  CL 93* 93*  CO2 31 35*  GLUCOSE 99 89  BUN 44* 58*  CREATININE 1.66* 1.78*  CALCIUM 9.3 9.7   BNP: Pro B Natriuretic peptide (BNP)  Date/Time Value Range Status  04/10/2013  5:27 AM 1600.0* 0 - 125 pg/mL Final  04/08/2013  2:58 AM 6010.0* 0 - 125 pg/mL Final   Thyroid Function Tests:No results found for this basename: TSH, T4TOTAL, FREET3,  T3FREE, THYROIDAB,  in the last 72 hours TELE:  SR, sinus brady      Radiology/Studies: Ct Chest Wo Contrast  04/18/2013   CLINICAL DATA:  Concern for pulmonary infiltrate  EXAM: CT CHEST WITHOUT CONTRAST  TECHNIQUE: Multidetector CT imaging of the chest was performed following the standard protocol without IV contrast.  COMPARISON:  MR CARD MORPHOLOGY WO/W CM dated 04/14/2013; CT ANGIO CHEST W/CM &/OR WO/CM dated 05/06/2012  FINDINGS: Noncontrast evaluation of the thoracic inlet is unremarkable.  Multichamber cardiac enlargement is appreciated. Subcentimeter lymph prevascular and right paratracheal lymph nodes are identified. Within the limitations of a noncontrast CT there is no evidence of appreciable pathologic sized adenopathy.  The lung parenchyma demonstrates  no evidence of focal infiltrates, nor focal regions of consolidation. There are minimal areas of pleural thickening within the dependent bases of the lungs. There is no evidence of pulmonary nodules nor masses. The central airways are patent.  Visualized upper abdominal viscera demonstrate no gross abnormalities.  Multilevel central endplate depressions and of multilevel areas of vertebral body height loss are appreciated throughout the thoracic and lumbar spine. There does not appear to be CT evidence of aggressive appearing osseous lesions.  IMPRESSION: 1. No CT evidence of pulmonary infiltrates or regions of consolidation. 2. Within the limitations of a noncontrast CT there is no evidence of significant mediastinal adenopathy or masses. Multichamber cardiac enlargement is identified.   Electronically Signed   By: Margaree Mackintosh M.D.   On: 04/18/2013 12:54     Current Medications:  . alum & mag hydroxide-simeth  15 mL Oral TID AC  . carvedilol  6.25 mg Oral BID WC  . enoxaparin (LOVENOX) injection  40 mg Subcutaneous Q24H  . lisinopril  10 mg Oral BID  . sodium chloride  3 mL Intravenous Q12H   acetaminophen, acetaminophen, hydrALAZINE, HYDROcodone-acetaminophen, ondansetron (ZOFRAN) IV, ondansetron, sodium chloride   ASSESSMENT AND PLAN: 1. - Combined Diastolic and Systolic CHF, acute on chronic w/ pulmonary HTN - NICM. EF 20-25% with RV dilated and systolic fx mod reduced. Possible infiltrative disease, see cath report, recommends eval for infiltrative CM w/ MRI due to restrictive physiology.  cMRI possible infiltrative disease. SPEP and UPEP pending.  HIV negative and TSH nl.CT of chest no evidence of pulmonary infiltrates.   Volume status low.  Weight up 4 pounds. Overall weight down 15 pounds. Creatinine continues to rise. 1.0>1.6>1.7 . Continue to hold diuretics. Will need to stop lisinopril.   Continue current dose of carvedilol.   2. Chest pain - CE negative, resolved.  No sig CAD at  cath 3.Osteogenesis imperfecta - per IM 4. HTN (hypertension), malignant - Soft this am.  5. Abdominal bloating - resolved  6. Suspected OSA (obstructive sleep apnea) / OHS - Using CPAP in the hospital. Needs outpatient sleep study.  7. Obesity 8. Depression  9. Acute kidney injury likely due to overdiuresis  Hold discharge.    CLEGG,AMY NP-C 7:46 AM   Patient seen and examined with Darrick Grinder, NP. We discussed all aspects of the encounter. I agree with the assessment and plan as stated above. Doing well. Renal function slightly worse likely due to overdiuresis. Will hold diuretics today and recheck BMET in am. Likely home tomorrow. Will pend d/c. Dry weight likely 195-200.   Chidiebere Wynn,MD 5:40 PM

## 2013-04-20 NOTE — Progress Notes (Signed)
[facility] PROGRESS NOTE  Jerry Edwards [id number] DOB: [birth year] DOA: 04/08/2013 PCP: [name], MD  Brief narrative  43 y.o. male with Past medical history of osteogenesis imperfecta, hypertension, nonischemic cardiomyopathy presented with SOB and chest pain. admitted for r/o ACS and Acute CHF exacerbation.   Assessment/Plan:  Systolic CHF, acute on chronic  -Ace inhibitor and coreg on board -cardiology providing rec's on diuretic regimen -Cr has now bumped to 1.78; plan is to continue holding diuretics and lisinopril as well -reassess BMET in am -no SOB and no crackles -patient approx 15 pounds off since admission. -Cardiac MRI suggestive of infiltrative disease. Serum ACE ok and CT chest shows no evidence of sarcoidosis. Will need biopsy of endocardium as an outpatient. -weight 194 (dry weight 195-200) -will follow cardiology rec's for outpatient regimen.  Bradycardia with pauses / ? Junctional rhythm  -Possibly related to OSA. CPAP ordered.  -continue low dose coreg  Probable OSA -RN reports that patient's oxygen saturations drop when he is sleeping. Also has short sinus pauses on monitor. -positive apneic episodes during cath  -outpatient sleep study arranged as an outpatient (05/18/13).  -Started on CPAP with auto-mode while inpatient.  ARF -secondary to diuresis and nephrotoxic agents -close monitoring of renal failure -hold lasix and aldactone today -hold lisinopril -BMET in am  Osteogenesis imperfecta  -will recommend outpatient surveillance for osteoporosis.   HTN (hypertension), malignant with HTN heart disease  -Soft, but stable and well controlled currently -Will monitor  Family Communication: significant other at bedside  Disposition Plan: home  Consult: cardiology  Procedures/Studies:  Cardiac cath on 1/21   Antibiotics:  None  HPI/Subjective:  Breathing a whole lot better. Currently at dry weight (194); No CP  .  Objective: Filed Vitals:   04/20/13 1420  BP: 106/56  Pulse: 66  Temp: 97.5 F (36.4 C)  Resp: 20    Intake/Output Summary (Last 24 hours) at 04/20/13 1657 Last data filed at 04/20/13 1300  Gross per 24 hour  Intake    600 ml  Output    600 ml  Net      0 ml   Filed Weights   04/18/13 0619 04/19/13 0530 04/20/13 0628  Weight: 86.6 kg (190 lb 14.7 oz) 86.6 kg (190 lb 14.7 oz) 88.043 kg (194 lb 1.6 oz)    Exam:  General: on side of bed having lunch, no acute distress, breathing better. Afebrile  HEENT: no pallor, moist mucosa  Cardiovascular: RRR, S1/S2 normal , no rubs, no gallops, soft SEM  Respiratory: CTA without wheezing, rales or crackles Abdomen: Soft/+BS, non tender, non distended,  Extremities: trace edema   Data Reviewed: Basic Metabolic Panel:  Recent Labs Lab 04/16/13 0610 04/17/13 0407 04/18/13 0511 04/19/13 0609 04/20/13 0540  NA 142 144 140 141 139  K 4.3 4.5 3.5* 4.3 5.1  CL 100 98 92* 93* 93*  CO2 31 28 32 31 35*  GLUCOSE 79 79 84 99 89  BUN 21 21 26* 44* 58*  CREATININE 0.90 0.88 1.04 1.66* 1.78*  CALCIUM 8.9 9.1 9.5 9.3 9.7    BNP (last 3 results)  Recent Labs  07/11/12 1117 04/08/13 0258 04/10/13 0527  PROBNP 214.0* 6010.0* 1600.0*   CBG:  Recent Labs Lab 04/18/13 1458  GLUCAP 112*    Studies: No results found.  Scheduled Meds: . alum & mag hydroxide-simeth  15 mL Oral TID AC  . carvedilol  6.25 mg Oral BID WC  . enoxaparin (LOVENOX) injection  40 mg Subcutaneous  Q24H  . sodium chloride  3 mL Intravenous Q12H   Continuous Infusions:    Time spent: <30 minutes,   Malaya Cagley  [facility] Pager [phone]. If 7PM-7AM, please contact night-coverage at [email or web address], password [facility] 04/20/2013, 4:57 PM  LOS: 12 days

## 2013-04-20 NOTE — Progress Notes (Signed)
Set up CPAP for pt use tonight. Water provided for humidity and O2 hooked up in line. Pt denies needing anything else at this time; states he will place himself on "a little later". Informed pt to notify for RT if he needs further assistance.

## 2013-04-20 NOTE — Discharge Instructions (Signed)
Sleep Study Appointment:  Jerry Edwards [phone]  Appointment:  05/18/2013 - Thursday [facility] will mail documents to patient for completion prior to appointment. [address] RN, BSN, [place], [place] [phone] 04/19/2013

## 2013-04-21 LAB — BASIC METABOLIC PANEL
BUN: 47 mg/dL — ABNORMAL HIGH (ref 6–23)
CALCIUM: 9.7 mg/dL (ref 8.4–10.5)
CO2: 31 mEq/L (ref 19–32)
Chloride: 94 mEq/L — ABNORMAL LOW (ref 96–112)
Creatinine, Ser: 1.25 mg/dL (ref 0.50–1.35)
GFR, EST AFRICAN AMERICAN: 80 mL/min — AB (ref >90)
GFR, EST NON AFRICAN AMERICAN: 69 mL/min — AB (ref >90)
Glucose, Bld: 75 mg/dL (ref 70–99)
Potassium: 4.6 mEq/L (ref 3.7–5.3)
SODIUM: 138 meq/L (ref 137–147)

## 2013-04-21 MED ORDER — POTASSIUM CHLORIDE CRYS ER 10 MEQ PO TBCR: 10.0000 meq | EXTENDED_RELEASE_TABLET | Freq: Every day | ORAL | Status: DC

## 2013-04-21 MED ORDER — LISINOPRIL 2.5 MG PO TABS: 2.5000 mg | ORAL_TABLET | Freq: Two times a day (BID) | ORAL | Status: DC

## 2013-04-21 MED ORDER — LISINOPRIL 2.5 MG PO TABS
2.5000 mg | ORAL_TABLET | Freq: Two times a day (BID) | ORAL | Status: DC
  Administered 2013-04-21: 2.5 mg via ORAL
  Filled 2013-04-21 (×2): qty 1

## 2013-04-21 MED ORDER — ASPIRIN EC 81 MG PO TBEC: 81.0000 mg | DELAYED_RELEASE_TABLET | Freq: Every day | ORAL | Status: AC

## 2013-04-21 MED ORDER — CARVEDILOL 6.25 MG PO TABS: 25.0000 mg | ORAL_TABLET | Freq: Two times a day (BID) | ORAL | Status: DC

## 2013-04-21 MED ORDER — FUROSEMIDE 40 MG PO TABS
40.0000 mg | ORAL_TABLET | Freq: Every day | ORAL | Status: DC
  Administered 2013-04-21: 40 mg via ORAL
  Filled 2013-04-21: qty 1

## 2013-04-21 MED ORDER — POTASSIUM CHLORIDE 20 MEQ/15ML (10%) PO LIQD: 10.0000 meq | Freq: Every day | ORAL | Status: DC

## 2013-04-21 NOTE — Progress Notes (Signed)
Patient Name: Jerry Edwards Date of Encounter: 04/21/2013  Principal Problem:   Acute on chronic combined systolic and diastolic CHF, NYHA class 4 Active Problems:   Chest pain   Osteogenesis imperfecta   HTN (hypertension), malignant   Abdominal bloating   Suspected OSA (obstructive sleep apnea) / OHS   Bradycardia with pauses / ? junctional rhythm   Pulmonary hypertension   Primary Cardiologist: Dr. Cathie Olden  SUBJECTIVE: Admitted with dyspnea and SOB.   ECHO 04/08/13 ECHO EF 25%    RHC 04/08/13  Right heart catheterization:  RA pressure: Severely elevated at 39/39 with a mean of 34. Prominent X. and Y. Descends.  RV pressure: 76/26 with a right ventricular end-diastolic pressure of 41 mm mercury  PA pressure: 70/36 with a mean of 51  Pulmonary capillary wedge pressure: 31/30 and mean of 29  LV pressure: 140/26 with a left ventricular end-diastolic pressure of 35  PA sat 66%  AO sat 97%  Cardiac output: 4.94 with a cardiac index of 2.63  Pulmonary vascular resistance: 4.45 Woods units   CMRI- EF 38%  possible infiltrative disease. SPEP UPEP pending.  CT of chest-no evidence of pulmonary infiltrates.    Diuretics held for the past 2 days due to elevated creatinine. Yesterday lisinopril stopped. Overall his weight is down 16 pounds. Denies SOB. Wants to go home.   Creatinine 0.8> 1.04 >1.6>1.7>1.2  K 3.5 >4.3>5.1> 4.6  OBJECTIVE Filed Vitals:   04/20/13 1054 04/20/13 1420 04/20/13 1946 04/21/13 0517  BP: 105/51 106/56 112/64 119/75  Pulse: 74 66 71 76  Temp:  97.5 F (36.4 C) 97.9 F (36.6 C) 98.3 F (36.8 C)  TempSrc:  Oral Oral Oral  Resp:  20 20 22   Height:      Weight:    193 lb 4.8 oz (87.68 kg)  SpO2: 96% 92% 92% 100%    Intake/Output Summary (Last 24 hours) at 04/21/13 0729 Last data filed at 04/21/13 0521  Gross per 24 hour  Intake   1080 ml  Output   1150 ml  Net    -70 ml   Filed Weights   04/19/13 0530 04/20/13 0628 04/21/13 0517  Weight:  190 lb 14.7 oz (86.6 kg) 194 lb 1.6 oz (88.043 kg) 193 lb 4.8 oz (87.68 kg)    PHYSICAL EXAM General: Well developed, well nourished, male in no acute distress; Lying in bed. With CPAP on    Head: Normocephalic, atraumatic.  Neck: Supple without bruits, JVD difficult to assess secondary to body habitus, but about ~7-8  Lungs:  Resp regular and unlabored, Clear. Heart: RRR, S1, S2, + S3, S4, 2/6 murmur; no rub. Abdomen: Soft, non-tender, + distended, BS + x 4.  Extremities: No clubbing, cyanosis, trace  bilateral LE edema.  Neuro: Alert and oriented X 3. Moves all extremities spontaneously. Psych: Normal affect.  LABS: CBC:No results found for this basename: WBC, NEUTROABS, HGB, HCT, MCV, PLT,  in the last 72 hours Basic Metabolic Panel:  Recent Labs  04/20/13 0540 04/21/13 0327  NA 139 138  K 5.1 4.6  CL 93* 94*  CO2 35* 31  GLUCOSE 89 75  BUN 58* 47*  CREATININE 1.78* 1.25  CALCIUM 9.7 9.7   BNP: Pro B Natriuretic peptide (BNP)  Date/Time Value Range Status  04/10/2013  5:27 AM 1600.0* 0 - 125 pg/mL Final  04/08/2013  2:58 AM 6010.0* 0 - 125 pg/mL Final   Thyroid Function Tests:No results found for this basename: TSH, T4TOTAL,  FREET3, T3FREE, THYROIDAB,  in the last 72 hours TELE:  SR   Radiology/Studies: No results found.   Current Medications:  . alum & mag hydroxide-simeth  15 mL Oral TID AC  . carvedilol  6.25 mg Oral BID WC  . enoxaparin (LOVENOX) injection  40 mg Subcutaneous Q24H  . sodium chloride  3 mL Intravenous Q12H   acetaminophen, acetaminophen, hydrALAZINE, HYDROcodone-acetaminophen, ondansetron (ZOFRAN) IV, ondansetron, sodium chloride   ASSESSMENT AND PLAN: 1. - Combined Diastolic and Systolic CHF, acute on chronic w/ pulmonary HTN - NICM. EF 20-25% with RV dilated and systolic fx mod reduced. Possible infiltrative disease, see cath report, recommends eval for infiltrative CM w/ MRI due to restrictive physiology.  cMRI possible infiltrative  disease. SPEP and UPEP pending.  HIV negative and TSH nl.CT of chest no evidence of pulmonary infiltrates.   Volume status stable. Weight down 1 pounds. New baseline weight 193-195 pounds.  Overall weight down 16 pounds. Creatinine back down 1.7>1.2  Restart lasix 40 mg daily. Add low dose lisinopril 2.5 mg twice a day. Continue current dose of carvedilol.  Will not add spiro yet. Try to add as outpatient. Follow up in HF clinic 04/27/13 at 11:40 . Will provide scale prior to discharge.  2. Chest pain - CE negative, resolved.  No sig CAD at cath 3.Osteogenesis imperfecta - per IM 4. HTN (hypertension), malignant - Soft this am.  5. Abdominal bloating - resolved  6. Suspected OSA (obstructive sleep apnea) / OHS - Using CPAP in the hospital. Needs outpatient sleep study. Set up for the end of February.  7. Obesity 8. Depression  9. Acute kidney injury likely due to overdiuresis  Can go home today.   HF Meds.  Lasix 40 mg daily  Lisinopril 2.5 mg twice a day Carvedilol 6.25 mg twice a day Potassium 10 meq daily can start tomorrow.   CLEGG,AMY NP-C 7:29 AM  Patient seen with, agree with the above note.    His weight is down 16 lbs, he feels good.  Will send him home on the meds listed above.  Will titrate up carefully in clinic.   Loralie Champagne 04/21/2013 7:51 AM

## 2013-04-21 NOTE — Discharge Summary (Signed)
Physician Discharge Summary  Jerry Edwards [id number] DOB: 18-May-1970 DOA: 04/08/2013  PCP: [name], MD  Admit date: 04/08/2013 Discharge date: 04/21/2013  Time spent: >30 minutes  Recommendations for Outpatient Follow-up:  1. BMET to follow electrolytes and renal function 2. Reassess BP and continue adjusting/titrating meds 3. Will benefit of endocardium biopsy in outpatient setting 4. Follow SPEP/UPEP  BNP    Component Value Date/Time   PROBNP 1600.0* 04/10/2013 0527   Filed Weights   04/19/13 0530 04/20/13 0628 04/21/13 0517  Weight: 86.6 kg (190 lb 14.7 oz) 88.043 kg (194 lb 1.6 oz) 87.68 kg (193 lb 4.8 oz)     Discharge Diagnoses:  Principal Problem:   Acute on chronic combined systolic and diastolic CHF, NYHA class 4 Active Problems:   Chest pain   Osteogenesis imperfecta   HTN (hypertension), malignant   Abdominal bloating   Suspected OSA (obstructive sleep apnea) / OHS   Bradycardia with pauses / ? junctional rhythm   Pulmonary hypertension   Discharge Condition: stable and improved. Will follow with PCP in 2 weeks and follow up with heart failure clinic in 1 week.  Diet recommendation: low sodium diet   History of present illness:  43 y.o. male with Past medical history of osteogenesis imperfecta, hypertension, nonischemic cardiomyopathy.  The patient is coming from home.  The patient presented with complaints of shortness of breath and chest pain. He mentions that since last one week he has been having sensation of chest tightness on and off resolving on its own within 10-15 minutes. Chest tightness was substernal and nonradiating and has been associated with shortness of breath. He mentions he has baseline shortness of breath which got worse over last few weeks. He also started having orthopnea since last one week denies any PND or palpitation. He started noticing worsening leg swelling since last few weeks as well. He mentions over last 6 months he  might have gained 30 pounds.  He also mentions he is compliant with his medication.  Today while at work he started having chest pain which was most severe and therefore the hospital.  He has not been checking his blood pressure at home.  He denies any sick contacts.  Pt denies any fever, chills, headache, cough,nausea, vomiting, abdominal pain, diarrhea, constipation, active bleeding, burning urination, dizziness, focal neurological deficit.    Hospital Course:  Systolic CHF, acute on chronic  -Ace inhibitor and coreg on board  -cardiology providing rec's on diuretic regimen  -Cr has now bumped to 1.78; plan is to continue holding diuretics and lisinopril as well  -reassess BMET in am  -no SOB and no crackles  -patient approx 15 pounds off since admission.  -Cardiac MRI suggestive of infiltrative disease. Serum ACE ok and CT chest shows no evidence of sarcoidosis. Will need biopsy of endocardium as an outpatient.  -weight 193 at discharge (dry weight 195-200)  -will follow cardiology rec's for outpatient regimen (Lasix 40 mg daily ; Lisinopril 2.5 mg twice a day; Carvedilol 6.25 mg twice a day; Potassium 10 meq daily)   Bradycardia with pauses / ? Junctional rhythm  -Possibly related to OSA. CPAP ordered.  -continue low dose coreg   Probable OSA  -RN reports that patient's oxygen saturations drop when he is sleeping. Also has short sinus pauses on monitor.  -positive apneic episodes during cath  -outpatient sleep study arranged as an outpatient (05/18/13).  -Started on CPAP with auto-mode while inpatient and will try to get machine through [facility] prior to  study if possible..   ARF  -secondary to diuresis and nephrotoxic agents  -will discharge on  Low dose lasix and lisinopril and will follow BMET in one week -at discharge Cr 1.2  Osteogenesis imperfecta  -will recommend outpatient surveillance for osteoporosis.   HTN (hypertension), malignant with HTN heart disease  -Soft, but  stable and well controlled currently  -Will monitor closely in outpatient setting and adjust meds as needed   Procedures:  ECHO 04/08/13 ECHO EF 25%   RHC 04/08/13  RA pressure: Severely elevated at 39/39 with a mean of 34. Prominent X. and Y. Descends.  RV pressure: 76/26 with a right ventricular end-diastolic pressure of 41 mm mercury  PA pressure: 70/36 with a mean of 51  Pulmonary capillary wedge pressure: 31/30 and mean of 29  LV pressure: 140/26 with a left ventricular end-diastolic pressure of 35  PA sat 66%  AO sat 97%  Cardiac output: 4.94 with a cardiac index of 2.63  Pulmonary vascular resistance: 4.45 Woods units    CMRI- EF 38% possible infiltrative disease. SPEP UPEP pending.   CT of chest-no evidence of pulmonary infiltrates.   Consultations:  cardiology  Discharge Exam: Filed Vitals:   04/21/13 1341  BP: 126/69  Pulse: 80  Temp: 98.4 F (36.9 C)  Resp: 20   General: on side of bed having lunch, no acute distress, breathing better. Afebrile  HEENT: no pallor, moist mucosa  Cardiovascular: RRR, S1/S2 normal , no rubs, no gallops, soft, SEM  Respiratory: CTA without wheezing, rales or crackles  Abdomen: Soft/+BS, non tender, non distended,  Extremities: trace edema   Discharge Instructions  Discharge Orders   Future Appointments Provider Department Dept Phone   04/27/2013 11:40 AM [facility] [place] [phone]   05/18/2013 8:00 PM [facility] [place] [phone]   Future Orders Complete By Expires   Split night study  05/18/2013 04/20/2014   Questions:     Where should this test be performed:  [place]   Ambulatory referral to Sleep Studies  As directed    Diet - low sodium heart healthy  As directed    Discharge instructions  As directed    Comments:     Take medications as prescribed Follow up with heart failure clinic in 1 week as  instructed Follow a low sodium diet (< 2 grams sodium daily) Check your weight everyday (contact clinic with gain weight of > 3 pounds overnight or > 5 pounds in one week)       Medication List    STOP taking these medications       hydrALAZINE 25 MG tablet  Commonly known as:  APRESOLINE      TAKE these medications       aspirin EC 81 MG tablet  Take 1 tablet (81 mg total) by mouth daily.     carvedilol 6.25 MG tablet  Commonly known as:  COREG  Take 4 tablets (25 mg total) by mouth 2 (two) times daily with a meal.     furosemide 40 MG tablet  Commonly known as:  LASIX  Take 1 tablet (40 mg total) by mouth daily.     lisinopril 2.5 MG tablet  Commonly known as:  PRINIVIL,ZESTRIL  Take 1 tablet (2.5 mg total) by mouth 2 (two) times daily.     potassium chloride 10 MEQ tablet  Commonly known as:  K-DUR,KLOR-CON  Take 1 tablet (10  mEq total) by mouth daily.       Allergies  Allergen Reactions  . Fish Allergy Anaphylaxis  . Peanuts [Peanut Oil] Anaphylaxis  . Shellfish Allergy Anaphylaxis  . Latex Itching       Follow-up Information   Follow up with Glori Bickers, MD On 04/27/2013. ( at 11:40 Garage Code 0300)    Specialty:  Cardiology   Contact information:   [address] [place] [address] [phone]       Follow up with [facility] On 05/18/2013. (05/18/2013 - Thursday 8pm)    Contact information:   [address], [place] [address] [phone]      Follow up with BURNETT,BRENT A, MD. Schedule an appointment as soon as possible for a visit in 2 weeks.   Specialty:  Family Medicine   Contact information:   [id number] [place] [address] [phone]       The results of significant diagnostics from this hospitalization (including imaging, microbiology, ancillary and laboratory) are listed below for reference.    Significant Diagnostic Studies: Ct Chest Wo  Contrast  04/18/2013   CLINICAL DATA:  Concern for pulmonary infiltrate  EXAM: CT CHEST WITHOUT CONTRAST  TECHNIQUE: Multidetector CT imaging of the chest was performed following the standard protocol without IV contrast.  COMPARISON:  MR CARD MORPHOLOGY WO/W CM dated 04/14/2013; CT ANGIO CHEST W/CM &/OR WO/CM dated 05/06/2012  FINDINGS: Noncontrast evaluation of the thoracic inlet is unremarkable.  Multichamber cardiac enlargement is appreciated. Subcentimeter lymph prevascular and right paratracheal lymph nodes are identified. Within the limitations of a noncontrast CT there is no evidence of appreciable pathologic sized adenopathy.  The lung parenchyma demonstrates no evidence of focal infiltrates, nor focal regions of consolidation. There are minimal areas of pleural thickening within the dependent bases of the lungs. There is no evidence of pulmonary nodules nor masses. The central airways are patent.  Visualized upper abdominal viscera demonstrate no gross abnormalities.  Multilevel central endplate depressions and of multilevel areas of vertebral body height loss are appreciated throughout the thoracic and lumbar spine. There does not appear to be CT evidence of aggressive appearing osseous lesions.  IMPRESSION: 1. No CT evidence of pulmonary infiltrates or regions of consolidation. 2. Within the limitations of a noncontrast CT there is no evidence of significant mediastinal adenopathy or masses. Multichamber cardiac enlargement is identified.   Electronically Signed   By: Margaree Mackintosh M.D.   On: 04/18/2013 12:54   Dg Chest Port 1 View  04/08/2013   CLINICAL DATA:  Dyspnea, cough and congestion.  History of smoking.  EXAM: PORTABLE CHEST - 1 VIEW  COMPARISON:  Chest radiograph and CTA of the chest performed 05/06/2012  FINDINGS: The lungs are mildly hypoexpanded. Vascular congestion is noted, with question of mild interstitial edema. No definite pleural effusion or pneumothorax is seen.  The  cardiomediastinal silhouette is enlarged, as on the prior study. No acute osseous abnormalities are seen.  IMPRESSION: Lungs mildly hypoexpanded. Vascular congestion and cardiomegaly, with question of mild interstitial edema.   Electronically Signed   By: Garald Balding M.D.   On: 04/08/2013 03:25   Mr Card Morphology Wo/w Cm  04/14/2013   CLINICAL DATA:  Cardiomyopathy of uncertain etiology  EXAM: CARDIAC MRI  TECHNIQUE: The patient was scanned on a 1.5 Tesla GE magnet. A dedicated cardiac coil was used. Functional imaging was done using Fiesta sequences. 2,3, and 4 chamber  views were done to assess for RWMA's. Modified Simpson's rule using a short axis stack was used to calculate an ejection fraction on a dedicated work Conservation officer, nature. The patient received 30 cc of Multihance. After 10 minutes inversion recovery sequences were used to assess for infiltration and scar tissue.  CONTRAST:  30 cc Multihance  MEASUREMENTS: MEASUREMENTS LV EDV 230 mL  LV SV 87 mL  LV EF 38%  FINDINGS: Limited views of the lung fields did not show gross abnormalities. Normal left ventricular size with mild LV hypertrophy. There was moderate global hypokinesis with EF 38%. Mild left atrial enlargement, normal right atrial size. Normal right ventricular size with mild to moderately decreased systolic function. There did not appear to be significant mitral regurgitation. The aortic valve was trileaflet without significant stenosis or regurgitation.  Delayed enhancement images were difficult. There appeared to be a small discrete area of mid-wall enhancement in the mid inferolateral wall. There was a small discrete area of subendocardial enhancement in the mid anterolateral wall. There was a small discrete area of subepicardial enhancement at the mid inferoseptal RV insertion site.  IMPRESSION: 1. Normal LV size with mild LV hypertrophy. EF 38% with global hypokinesis.  2.  Normal RV size with mild to moderate RV systolic  dysfunction.  3. Non-coronary delayed enhancement pattern as described above. This suggests the possibility of an infiltrative disease such as cardiac involvement by sarcoidosis.  Dalton Mclean   Electronically Signed   By: Loralie Champagne M.D.   On: 04/14/2013 18:46   Labs: Basic Metabolic Panel:  Recent Labs Lab 04/17/13 0407 04/18/13 0511 04/19/13 0609 04/20/13 0540 04/21/13 0327  NA 144 140 141 139 138  K 4.5 3.5* 4.3 5.1 4.6  CL 98 92* 93* 93* 94*  CO2 28 32 31 35* 31  GLUCOSE 79 84 99 89 75  BUN 21 26* 44* 58* 47*  CREATININE 0.88 1.04 1.66* 1.78* 1.25  CALCIUM 9.1 9.5 9.3 9.7 9.7   BNP: BNP (last 3 results)  Recent Labs  07/11/12 1117 04/08/13 0258 04/10/13 0527  PROBNP 214.0* 6010.0* 1600.0*   CBG:  Recent Labs Lab 04/18/13 1458  GLUCAP 112*    Signed:  Edvin Albus  [facility] 04/21/2013, 3:40 PM

## 2013-04-27 ENCOUNTER — Ambulatory Visit (HOSPITAL_COMMUNITY)
Admit: 2013-04-27 | Discharge: 2013-04-27 | Disposition: A | Discharge status: Home / Self Care | Payer: Medicare Other | Attending: Internal Medicine | Admitting: Internal Medicine

## 2013-04-27 ENCOUNTER — Encounter (HOSPITAL_COMMUNITY): Payer: Self-pay

## 2013-04-27 VITALS — BP 158/100 | HR 70 | Wt 196.8 lb

## 2013-04-27 DIAGNOSIS — I2789 Other specified pulmonary heart diseases: Secondary | ICD-10-CM | POA: Diagnosis not present

## 2013-04-27 DIAGNOSIS — Z7982 Long term (current) use of aspirin: Secondary | ICD-10-CM | POA: Insufficient documentation

## 2013-04-27 DIAGNOSIS — I129 Hypertensive chronic kidney disease with stage 1 through stage 4 chronic kidney disease, or unspecified chronic kidney disease: Secondary | ICD-10-CM | POA: Diagnosis not present

## 2013-04-27 DIAGNOSIS — Z87891 Personal history of nicotine dependence: Secondary | ICD-10-CM | POA: Insufficient documentation

## 2013-04-27 DIAGNOSIS — G4733 Obstructive sleep apnea (adult) (pediatric): Secondary | ICD-10-CM

## 2013-04-27 DIAGNOSIS — I10 Essential (primary) hypertension: Secondary | ICD-10-CM

## 2013-04-27 DIAGNOSIS — E669 Obesity, unspecified: Secondary | ICD-10-CM | POA: Diagnosis not present

## 2013-04-27 DIAGNOSIS — N189 Chronic kidney disease, unspecified: Secondary | ICD-10-CM | POA: Diagnosis not present

## 2013-04-27 DIAGNOSIS — I509 Heart failure, unspecified: Secondary | ICD-10-CM

## 2013-04-27 DIAGNOSIS — Q780 Osteogenesis imperfecta: Secondary | ICD-10-CM | POA: Diagnosis not present

## 2013-04-27 DIAGNOSIS — Z79899 Other long term (current) drug therapy: Secondary | ICD-10-CM | POA: Insufficient documentation

## 2013-04-27 DIAGNOSIS — I5022 Chronic systolic (congestive) heart failure: Secondary | ICD-10-CM | POA: Diagnosis not present

## 2013-04-27 LAB — BASIC METABOLIC PANEL
BUN: 14 mg/dL (ref 6–23)
CO2: 26 mEq/L (ref 19–32)
Calcium: 9.5 mg/dL (ref 8.4–10.5)
Chloride: 104 mEq/L (ref 96–112)
Creatinine, Ser: 0.8 mg/dL (ref 0.50–1.35)
GFR calc Af Amer: >90 mL/min (ref >90)
GFR calc non Af Amer: >90 mL/min (ref >90)
GLUCOSE: 89 mg/dL (ref 70–99)
POTASSIUM: 4.2 meq/L (ref 3.7–5.3)
SODIUM: 142 meq/L (ref 137–147)

## 2013-04-27 LAB — PRO B NATRIURETIC PEPTIDE: PRO B NATRI PEPTIDE: 5910 pg/mL — AB (ref 0–125)

## 2013-04-27 MED ORDER — CARVEDILOL 25 MG PO TABS: 25.0000 mg | ORAL_TABLET | Freq: Two times a day (BID) | ORAL | Status: DC

## 2013-04-27 MED ORDER — ISOSORB DINITRATE-HYDRALAZINE 20-37.5 MG PO TABS: 1.0000 | ORAL_TABLET | Freq: Three times a day (TID) | ORAL | Status: DC

## 2013-04-27 NOTE — Progress Notes (Signed)
Patient ID: Jerry Edwards, male   DOB: 27-Nov-1970, 43 y.o.   MRN: [id number]  Weight Range   Baseline proBNP    Primary Physician: Jerry Shire, MD  Primary Cardiologist: Jerry Edwards  HPI: Jerry Edwards is a 43 year old with history of NICM, Chronic systolic heart failure, HTN, OSA, and osteogenic imperfecta.   Admitted to [facility] 1/17 through 04/21/13 with increased dyspnea and mild volume overload. Question regarding possible infiltrative disease. ECHO 04/08/2013. EF 25%. cMRI possible infiltrative disease with discrete areas of non-coronary pattern delayed enhancement. Coronaries were normal on LHC.  SPEP no M spike and UPEP no free light chains. HIV negative and TSH nl.CT of chest no evidence of pulmonary infiltrates. D/C weight 193 pounds.   He returns for post hospital follow up with his Mom. Denies SOB/PND. Sleeps on 1 pillow. Using CPAP nightly. He has not been weighing at home because he doesn't have a scale. He is unable to weigh due to balance problem. He would like to try a scale. Plan for sleep study 04/17/13. He is wheelchair-bound due to osteogenesis imperfecta.     LHC/RHC 04/08/13  Normal coronaries RA pressure:  39/39 with a mean of 34. Prominent X. and Y. Descends.  RV pressure: 76/26  mean 4 PA pressure: 70/36 with a mean of 51  Pulmonary capillary wedge pressure: 31/30 and mean of 29  LV pressure: 140/26 with a left ventricular end-diastolic pressure of 35  PA sat 66%  AO sat 97%  CO/CI 4.94/2.63  Pulmonary vascular resistance: 4.45 Woods units   Labs (1/15): K 4.6, creatinine 1.25, TSH normal, UPEP negative, SPEP negative, transferrin saturation 39%, HIV negative, ACE level not elevated.   ROS: All systems negative except as listed in HPI, PMH and Problem List.  PMH: 1. HTN 2. Osteogenesis imperfecta: wheelchair-bound.  History of bone fractures.  3. Suspect OSA 4. CKD 5. Nonischemic cardiomyopathy: Echo (1/15) with EF 20-25%, severe diffuse hypokinesis, mild Jerry, mildly  enlarged RV with moderately decreased systolic function.  LHC/RHC (1/15) with normal coronaries, mean RA 34, PA 70/36 mean 51, mean PCWP 29, CI 2.63.  Cardiac MRI (1/15) with EF 38%, global hypokinesis, discrete areas of delayed enhancement in the subepicardial mid inferoseptal RV insertion site, the mid wall of the mid inferolateral wall, and the subendocardial mid anterolateral wall.  This pattern was suggestive of infiltrative disease.  UPEP/SPEP negative, HIV negative.  Chest CT to look for evidence of sarcoidosis did not show any sarcoid-type lesions.    SH:  Single, prior smoking now quit, occasional marijuana, works as Dispensing optician.   FH:  HTN, no significant cardiac disease.   Current Outpatient Prescriptions  Medication Sig Dispense Refill  . aspirin 81 MG tablet Take 1 tablet (81 mg total) by mouth daily.      . carvedilol (COREG) 6.25 MG tablet Take 4 tablets (25 mg total) by mouth 2 (two) times daily with a meal.  60 tablet  1  . furosemide (LASIX) 40 MG tablet Take 1 tablet (40 mg total) by mouth daily.  30 tablet  5  . lisinopril (PRINIVIL,ZESTRIL) 2.5 MG tablet Take 1 tablet (2.5 mg total) by mouth 2 (two) times daily.  60 tablet  1  . potassium chloride SA (K-DUR,KLOR-CON) 10 MEQ tablet Take 1 tablet (10 mEq total) by mouth daily.  30 tablet  1   No current facility-administered medications for this encounter.    PHYSICAL EXAM: Filed Vitals:   04/27/13 1142  BP: 158/100  Pulse: 70  Weight: 196 lb 12.8 oz (89.268 kg)  SpO2: 86%   General:  Well appearing. No resp difficulty Arrived in the clinic in a wheelchair Mom Present  HEENT: normal Neck: supple. JVP flat. Carotids 2+ bilaterally; no bruits. No lymphadenopathy or thryomegaly appreciated. Cor: PMI normal. Regular rate & rhythm. No rubs, gallops or murmurs. Lungs: clear Abdomen: soft, nontender, nondistended. No hepatosplenomegaly. No bruits or masses. Good bowel sounds. Extremities: no cyanosis, clubbing, rash, edema Neuro:  alert & orientedx3, cranial nerves grossly intact. Moves all 4 extremities w/o difficulty. Affect pleasant.   ASSESSMENT & PLAN: 1. Chronic Systolic Heart Failure. NICM EF 25% with no coronary disease on LHC in 1/15. 03/2013 cMRI possible infiltrative disease. SPEP and UPEP ok. NYHA II symptoms.  He does not appear volume overloaded. Based on the cMRI pattern and concern for cardiac sarcoidosis, a chest CT was done to look for evidence of pulmonary sarcoidosis.  This was not indicative of pulmonary sarcoidosis.  I still think it is possible that this is cardiac sarcoidosis as rarely, sarcoidosis can present only in the heart.  Patient has had no significant arrhythmias detected.  QRS on ECG is not widened.  - Continue lasix 40 mg daily as he appears euvolemic.  - On goal dose Coreg 25 mg bid.  He was actually supposed to go home on Coreg 6.25 mg bid but has been taking 4 pills twice a day.  He has been tolerating the higher dose fine and BP is actually high.  Therefore, I will continue it.  - Continue lisinopril 2.5 mg twice a day. I will not increase this today as he recently had AKI with diuresis and I need to followup on his BMET.  - Add Bidil 20/37.5 mg tid today. - Reinforced daily weights, low salt food choices, limiting fluid intake to < 2 liters, and medication compliance. Provided with scale and weight chart today.   - Check BMET /Pro BNP. Consider spironolactone/increased lisinopril if renal function ok.  - Repeat ECHO in 3 months after HF meds optimized, may need ICD.  2. Osteogenesis imperfecta - wheel chair.  3. HTN - BP elevated. Continue current dose coreg, lisinopril. Add BiDil 20/37.5 mg tid 4. Suspect OSA . Sleep study scheduled .  5. Obesity 6. Pulmonary hypertension- This appears to be primarily pulmonary venous hypertension on the recent RHC.  There may be a component of PAH related to OSA.    Follow up in 1 month   CLEGG,AMY NP-C 12:23 PM  Patient seen with NP, agree with  the above note.  I have edited the note.  Tolerating Coreg 25 mg bid without problems so will continue.  Euvolemic, continue current Lasix.  Will add Bidil 1 tab tid (should be able to get this as he has Medicaid).  Will not adjust lisinopril until I see his BMET. Will get him on a good medical regimen and repeat an echo in about 3 months.  See above for discussion of possible infiltrative disease.   Loralie Champagne 04/28/2013

## 2013-04-27 NOTE — Patient Instructions (Signed)
Follow up in 1 month  Take Bidil 20-37.5 mg three times a day  Do the following things EVERYDAY: 1) Weigh yourself in the morning before breakfast. Write it down and keep it in a log. 2) Take your medicines as prescribed 3) Eat low salt foods-Limit salt (sodium) to 2000 mg per day.  4) Stay as active as you can everyday 5) Limit all fluids for the day to less than 2 liters

## 2013-05-16 ENCOUNTER — Telehealth (HOSPITAL_COMMUNITY): Payer: Self-pay | Admitting: Cardiology

## 2013-05-16 DIAGNOSIS — D649 Anemia, unspecified: Secondary | ICD-10-CM

## 2013-05-16 DIAGNOSIS — I509 Heart failure, unspecified: Secondary | ICD-10-CM

## 2013-05-16 DIAGNOSIS — R0602 Shortness of breath: Secondary | ICD-10-CM

## 2013-05-16 NOTE — Telephone Encounter (Signed)
Per pt insurance will not pay for insurance because "code was put in incorrectly" if we changed code his insurance will pay for meds Currently the co pay is $300  Spoke with the pharmacy to see if med change,PA was needed.... no drug coverage in file  Pt will check to see if he has drug coverage, he is unsure  Will check with his mother and call back

## 2013-05-17 MED ORDER — FUROSEMIDE 40 MG PO TABS: 40.0000 mg | ORAL_TABLET | Freq: Every day | ORAL | Status: DC

## 2013-05-17 NOTE — Telephone Encounter (Signed)
Pt said he has been waiting 3 days to get his Furosemide Rx refilled- I refilled this.  Kerin Ransom PA-C 05/17/2013 6:35 PM

## 2013-05-18 ENCOUNTER — Ambulatory Visit (HOSPITAL_BASED_OUTPATIENT_CLINIC_OR_DEPARTMENT_OTHER): Payer: Medicare Other | Attending: Internal Medicine | Admitting: Radiology

## 2013-05-18 ENCOUNTER — Telehealth (HOSPITAL_COMMUNITY): Payer: Self-pay | Admitting: Anesthesiology

## 2013-05-18 VITALS — Ht <= 58 in | Wt 196.0 lb

## 2013-05-18 DIAGNOSIS — I509 Heart failure, unspecified: Secondary | ICD-10-CM

## 2013-05-18 DIAGNOSIS — R0602 Shortness of breath: Secondary | ICD-10-CM

## 2013-05-18 DIAGNOSIS — D649 Anemia, unspecified: Secondary | ICD-10-CM

## 2013-05-18 DIAGNOSIS — G4733 Obstructive sleep apnea (adult) (pediatric): Secondary | ICD-10-CM | POA: Diagnosis not present

## 2013-05-18 MED ORDER — FUROSEMIDE 40 MG PO TABS: 40.0000 mg | ORAL_TABLET | Freq: Every day | ORAL | Status: DC

## 2013-05-18 NOTE — Telephone Encounter (Signed)
Pt. Called and needs refill of lasix today. Sent script to [facility].

## 2013-05-20 DIAGNOSIS — G4733 Obstructive sleep apnea (adult) (pediatric): Secondary | ICD-10-CM | POA: Diagnosis not present

## 2013-05-20 NOTE — Sleep Study (Signed)
   NAME: Jerry Edwards DATE OF BIRTH:  [birth year] MEDICAL RECORD NUMBER [id number]  LOCATION: [facility]  PHYSICIAN: Chela Sutphen D  DATE OF STUDY: 05/18/2013  SLEEP STUDY TYPE: Nocturnal Polysomnogram               REFERRING PHYSICIAN: Barton Dubois, MD  INDICATION FOR STUDY: Hypersomnia with sleep apnea  EPWORTH SLEEPINESS SCORE:   7/24 HEIGHT: 4\' 9"  (144.8 cm)  WEIGHT: 196 lb (88.905 kg)    Body mass index is 42.4 kg/(m^2).  NECK SIZE: 17.5 in.  MEDICATIONS: Charted for review  SLEEP ARCHITECTURE: Total sleep time 246 minutes with sleep efficiency 66.9%. Stage I was 13%, stage II 68.3%, stage III absent, REM 18.7% of total sleep time. Sleep latency 76 minutes, REM latency 86 minutes, awake after sleep onset 44 minutes, arousal index 45.4 per hour. Bedtime medication: None.  RESPIRATORY DATA: Apnea hypopnea index (AHI) 21.5 per hour. 88 total events scored including 15 obstructive apneas and 73 hypopneas. All events were nonsupine. REM AHI 79.6 per hour. Sleep onset was delayed until midnight, preventing application of split protocol CPAP titration.  OXYGEN DATA: Moderate snoring with oxygen desaturation to a nadir of 73% and mean oxygen saturation through the study of 90.6% . Supplemental oxygen was added at 1 L per minute at 20 3:07 PM because of consistent desaturation below 88%.  CARDIAC DATA: Sinus rhythm with PVCs  MOVEMENT/PARASOMNIA: No significant movement disturbance, no bathroom trips  IMPRESSION/ RECOMMENDATION:   1) Moderate obstructive sleep apnea/hypopnea syndrome, AHI 21.5 per hour with nonsupine events. REM AHI 79.6 per hour. Moderate snoring with oxygen desaturation to a nadir of 73% and mean oxygen saturation through the study of 90.6% with supplemental oxygen at 1 L. 2) Sleep onset was delayed until midnight and that the patient did not meet protocol requirements for split protocol CPAP titration on this study. The patient can return for  dedicated CPAP titration study if appropriate. 3) Room air oxygen saturation on arrival while awake and upright was 85%. Supplemental oxygen was required during the study night. This suggests underlying cardiopulmonary disease.  Signed Kasandra Knudsen Taylen Osorto M.D. Deneise Lever Diplomate, American Board of Sleep Medicine  ELECTRONICALLY SIGNED ON:  05/20/2013, 10:34 AM [place] PH: [phone]   FX: [phone] [place]

## 2013-05-25 NOTE — Progress Notes (Deleted)
Opened in error

## 2013-05-26 ENCOUNTER — Inpatient Hospital Stay (HOSPITAL_COMMUNITY)
Admission: RE | Admit: 2013-05-26 | Discharge: 2013-05-26 | Disposition: A | Discharge status: Home / Self Care | Payer: Medicare Other | Source: Ambulatory Visit

## 2013-05-26 ENCOUNTER — Encounter (HOSPITAL_COMMUNITY): Payer: Self-pay

## 2013-06-26 ENCOUNTER — Other Ambulatory Visit (HOSPITAL_COMMUNITY): Payer: Self-pay

## 2013-06-26 ENCOUNTER — Other Ambulatory Visit (HOSPITAL_COMMUNITY): Payer: Self-pay | Admitting: Cardiology

## 2013-06-26 DIAGNOSIS — I5023 Acute on chronic systolic (congestive) heart failure: Secondary | ICD-10-CM

## 2013-06-26 MED ORDER — LISINOPRIL 2.5 MG PO TABS: 2.5000 mg | ORAL_TABLET | Freq: Two times a day (BID) | ORAL | Status: DC

## 2013-06-26 MED ORDER — POTASSIUM CHLORIDE CRYS ER 10 MEQ PO TBCR: 10.0000 meq | EXTENDED_RELEASE_TABLET | Freq: Every day | ORAL | Status: DC

## 2013-08-27 ENCOUNTER — Other Ambulatory Visit (HOSPITAL_COMMUNITY): Payer: Self-pay | Admitting: Internal Medicine

## 2013-10-08 ENCOUNTER — Other Ambulatory Visit (HOSPITAL_COMMUNITY): Payer: Self-pay | Admitting: Internal Medicine

## 2013-10-24 ENCOUNTER — Other Ambulatory Visit (HOSPITAL_COMMUNITY): Payer: Self-pay | Admitting: Internal Medicine

## 2013-12-05 ENCOUNTER — Other Ambulatory Visit (HOSPITAL_COMMUNITY): Payer: Self-pay

## 2013-12-05 DIAGNOSIS — R0602 Shortness of breath: Secondary | ICD-10-CM

## 2013-12-05 DIAGNOSIS — I5022 Chronic systolic (congestive) heart failure: Secondary | ICD-10-CM

## 2013-12-11 ENCOUNTER — Other Ambulatory Visit (HOSPITAL_COMMUNITY): Payer: Self-pay | Admitting: Internal Medicine

## 2013-12-11 ENCOUNTER — Other Ambulatory Visit (HOSPITAL_COMMUNITY): Payer: Self-pay

## 2013-12-11 DIAGNOSIS — R0602 Shortness of breath: Secondary | ICD-10-CM

## 2013-12-11 DIAGNOSIS — I5022 Chronic systolic (congestive) heart failure: Secondary | ICD-10-CM

## 2013-12-11 MED ORDER — FUROSEMIDE 40 MG PO TABS: 40.0000 mg | ORAL_TABLET | Freq: Every day | ORAL | Status: DC

## 2013-12-26 ENCOUNTER — Other Ambulatory Visit (HOSPITAL_COMMUNITY): Payer: Self-pay | Admitting: Internal Medicine

## 2014-01-05 ENCOUNTER — Other Ambulatory Visit (HOSPITAL_COMMUNITY): Payer: Self-pay | Admitting: Internal Medicine

## 2014-03-01 ENCOUNTER — Encounter (HOSPITAL_COMMUNITY): Payer: Self-pay | Admitting: Cardiovascular Disease

## 2014-03-05 ENCOUNTER — Other Ambulatory Visit (HOSPITAL_COMMUNITY): Payer: Self-pay | Admitting: Adult Health

## 2014-03-05 ENCOUNTER — Other Ambulatory Visit (HOSPITAL_COMMUNITY): Payer: Self-pay | Admitting: Internal Medicine

## 2014-03-06 ENCOUNTER — Encounter (HOSPITAL_COMMUNITY): Payer: Self-pay | Admitting: Cardiology

## 2014-04-11 ENCOUNTER — Other Ambulatory Visit (HOSPITAL_COMMUNITY): Payer: Self-pay | Admitting: Anesthesiology

## 2014-04-12 ENCOUNTER — Encounter (HOSPITAL_COMMUNITY): Payer: Self-pay | Admitting: Cardiology

## 2014-06-04 ENCOUNTER — Other Ambulatory Visit (HOSPITAL_COMMUNITY): Payer: Self-pay | Admitting: *Deleted

## 2014-06-04 ENCOUNTER — Other Ambulatory Visit (HOSPITAL_COMMUNITY): Payer: Self-pay | Admitting: Adult Health

## 2014-06-05 ENCOUNTER — Other Ambulatory Visit (HOSPITAL_COMMUNITY): Payer: Self-pay | Admitting: *Deleted

## 2014-06-13 ENCOUNTER — Encounter: Payer: Self-pay | Admitting: Cardiovascular Disease

## 2014-06-13 ENCOUNTER — Ambulatory Visit (INDEPENDENT_AMBULATORY_CARE_PROVIDER_SITE_OTHER): Payer: Medicare Other | Admitting: Cardiovascular Disease

## 2014-06-13 VITALS — BP 160/110 | HR 95 | Ht <= 58 in | Wt 201.0 lb

## 2014-06-13 DIAGNOSIS — I10 Essential (primary) hypertension: Secondary | ICD-10-CM | POA: Diagnosis not present

## 2014-06-13 DIAGNOSIS — I5042 Chronic combined systolic (congestive) and diastolic (congestive) heart failure: Secondary | ICD-10-CM

## 2014-06-13 MED ORDER — LISINOPRIL 2.5 MG PO TABS: 2.5000 mg | ORAL_TABLET | Freq: Every day | ORAL | Status: DC

## 2014-06-13 MED ORDER — CARVEDILOL 25 MG PO TABS: 25.0000 mg | ORAL_TABLET | Freq: Two times a day (BID) | ORAL | Status: DC

## 2014-06-13 MED ORDER — ISOSORB DINITRATE-HYDRALAZINE 20-37.5 MG PO TABS: 1.0000 | ORAL_TABLET | Freq: Three times a day (TID) | ORAL | Status: DC

## 2014-06-13 MED ORDER — FUROSEMIDE 40 MG PO TABS: ORAL_TABLET | ORAL | Status: DC

## 2014-06-13 NOTE — Patient Instructions (Signed)
Your physician recommends that you continue on your current medications as directed. Please refer to the Current Medication list given to you today.  Your physician wants you to follow-up in: 1 year with Dr. [name].  You will receive a reminder letter in the mail two months in advance. If you don't receive a letter, please call our office to schedule the follow-up appointment.  
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
## 2014-12-30 ENCOUNTER — Other Ambulatory Visit: Payer: Self-pay | Admitting: Cardiovascular Disease

## 2015-01-15 ENCOUNTER — Other Ambulatory Visit: Payer: Self-pay | Admitting: Cardiovascular Disease

## 2015-01-18 ENCOUNTER — Telehealth: Payer: Self-pay | Admitting: *Deleted

## 2015-01-18 ENCOUNTER — Other Ambulatory Visit: Payer: Self-pay | Admitting: Cardiovascular Disease
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
## 2015-01-18 NOTE — Telephone Encounter (Signed)
He will need to contact his pharmacy.

## 2015-01-18 NOTE — Telephone Encounter (Signed)
per MS pt will have to call Pharm regarding his lasix. pt expressed understanding

## 2015-03-25 ENCOUNTER — Other Ambulatory Visit: Payer: Self-pay | Admitting: Cardiovascular Disease

## 2015-03-27 ENCOUNTER — Other Ambulatory Visit: Payer: Self-pay | Admitting: Cardiovascular Disease

## 2015-03-28 ENCOUNTER — Emergency Department (HOSPITAL_COMMUNITY): Payer: Medicare Other

## 2015-03-28 ENCOUNTER — Encounter (HOSPITAL_COMMUNITY): Payer: Self-pay | Admitting: Emergency Medicine

## 2015-03-28 ENCOUNTER — Inpatient Hospital Stay (HOSPITAL_COMMUNITY)
Admission: EM | Admit: 2015-03-28 | Discharge: 2015-04-02 | DRG: 291 | Disposition: A | Discharge status: Home / Self Care | Payer: Medicare Other | Attending: Internal Medicine | Admitting: Internal Medicine

## 2015-03-28 DIAGNOSIS — Z833 Family history of diabetes mellitus: Secondary | ICD-10-CM

## 2015-03-28 DIAGNOSIS — Z91013 Allergy to seafood: Secondary | ICD-10-CM | POA: Diagnosis not present

## 2015-03-28 DIAGNOSIS — E876 Hypokalemia: Secondary | ICD-10-CM | POA: Diagnosis not present

## 2015-03-28 DIAGNOSIS — I5023 Acute on chronic systolic (congestive) heart failure: Secondary | ICD-10-CM | POA: Diagnosis present

## 2015-03-28 DIAGNOSIS — Z6841 Body Mass Index (BMI) 40.0 and over, adult: Secondary | ICD-10-CM | POA: Diagnosis not present

## 2015-03-28 DIAGNOSIS — J9602 Acute respiratory failure with hypercapnia: Secondary | ICD-10-CM

## 2015-03-28 DIAGNOSIS — Z87891 Personal history of nicotine dependence: Secondary | ICD-10-CM | POA: Diagnosis not present

## 2015-03-28 DIAGNOSIS — J9601 Acute respiratory failure with hypoxia: Secondary | ICD-10-CM | POA: Diagnosis present

## 2015-03-28 DIAGNOSIS — I248 Other forms of acute ischemic heart disease: Secondary | ICD-10-CM | POA: Diagnosis present

## 2015-03-28 DIAGNOSIS — Q780 Osteogenesis imperfecta: Secondary | ICD-10-CM | POA: Diagnosis not present

## 2015-03-28 DIAGNOSIS — I160 Hypertensive urgency: Secondary | ICD-10-CM | POA: Diagnosis present

## 2015-03-28 DIAGNOSIS — I10 Essential (primary) hypertension: Secondary | ICD-10-CM | POA: Diagnosis present

## 2015-03-28 DIAGNOSIS — I272 Other secondary pulmonary hypertension: Secondary | ICD-10-CM | POA: Diagnosis present

## 2015-03-28 DIAGNOSIS — Z91010 Allergy to peanuts: Secondary | ICD-10-CM

## 2015-03-28 DIAGNOSIS — Z8249 Family history of ischemic heart disease and other diseases of the circulatory system: Secondary | ICD-10-CM | POA: Diagnosis not present

## 2015-03-28 DIAGNOSIS — R0902 Hypoxemia: Secondary | ICD-10-CM

## 2015-03-28 DIAGNOSIS — I5043 Acute on chronic combined systolic (congestive) and diastolic (congestive) heart failure: Secondary | ICD-10-CM | POA: Diagnosis not present

## 2015-03-28 DIAGNOSIS — R778 Other specified abnormalities of plasma proteins: Secondary | ICD-10-CM | POA: Diagnosis present

## 2015-03-28 DIAGNOSIS — E119 Type 2 diabetes mellitus without complications: Secondary | ICD-10-CM | POA: Diagnosis present

## 2015-03-28 DIAGNOSIS — G4733 Obstructive sleep apnea (adult) (pediatric): Secondary | ICD-10-CM | POA: Diagnosis present

## 2015-03-28 DIAGNOSIS — Z7982 Long term (current) use of aspirin: Secondary | ICD-10-CM

## 2015-03-28 DIAGNOSIS — Z801 Family history of malignant neoplasm of trachea, bronchus and lung: Secondary | ICD-10-CM | POA: Diagnosis not present

## 2015-03-28 DIAGNOSIS — I110 Hypertensive heart disease with heart failure: Principal | ICD-10-CM | POA: Diagnosis present

## 2015-03-28 DIAGNOSIS — I161 Hypertensive emergency: Secondary | ICD-10-CM | POA: Diagnosis not present

## 2015-03-28 DIAGNOSIS — I429 Cardiomyopathy, unspecified: Secondary | ICD-10-CM | POA: Diagnosis present

## 2015-03-28 DIAGNOSIS — R7989 Other specified abnormal findings of blood chemistry: Secondary | ICD-10-CM | POA: Diagnosis present

## 2015-03-28 DIAGNOSIS — Z91040 Latex allergy status: Secondary | ICD-10-CM | POA: Diagnosis not present

## 2015-03-28 DIAGNOSIS — Z823 Family history of stroke: Secondary | ICD-10-CM

## 2015-03-28 DIAGNOSIS — Z9111 Patient's noncompliance with dietary regimen: Secondary | ICD-10-CM

## 2015-03-28 DIAGNOSIS — R0602 Shortness of breath: Secondary | ICD-10-CM | POA: Diagnosis not present

## 2015-03-28 DIAGNOSIS — I509 Heart failure, unspecified: Secondary | ICD-10-CM | POA: Diagnosis not present

## 2015-03-28 LAB — COMPREHENSIVE METABOLIC PANEL
ALBUMIN: 3.4 g/dL — AB (ref 3.5–5.0)
ALK PHOS: 66 U/L (ref 38–126)
ALT: 33 U/L (ref 17–63)
AST: 39 U/L (ref 15–41)
Anion gap: 11 (ref 5–15)
BILIRUBIN TOTAL: 1.3 mg/dL — AB (ref 0.3–1.2)
BUN: 15 mg/dL (ref 6–20)
CALCIUM: 8.9 mg/dL (ref 8.9–10.3)
CO2: 31 mmol/L (ref 22–32)
CREATININE: 1.17 mg/dL (ref 0.61–1.24)
Chloride: 103 mmol/L (ref 101–111)
GFR calc Af Amer: >60 mL/min (ref >60)
GFR calc non Af Amer: >60 mL/min (ref >60)
GLUCOSE: 102 mg/dL — AB (ref 65–99)
Potassium: 3.6 mmol/L (ref 3.5–5.1)
Sodium: 145 mmol/L (ref 135–145)
TOTAL PROTEIN: 7.4 g/dL (ref 6.5–8.1)

## 2015-03-28 LAB — CBC
HCT: 53 % — ABNORMAL HIGH (ref 39.0–52.0)
HEMATOCRIT: 46.4 % (ref 39.0–52.0)
HEMOGLOBIN: 14.6 g/dL (ref 13.0–17.0)
Hemoglobin: 16 g/dL (ref 13.0–17.0)
MCH: 28.7 pg (ref 26.0–34.0)
MCH: 29.3 pg (ref 26.0–34.0)
MCHC: 30.2 g/dL (ref 30.0–36.0)
MCHC: 31.5 g/dL (ref 30.0–36.0)
MCV: 95 fL (ref 78.0–100.0)
MCV: 93.2 fL (ref 78.0–100.0)
PLATELETS: 218 10*3/uL (ref 150–400)
Platelets: 180 10*3/uL (ref 150–400)
RBC: 5.58 MIL/uL (ref 4.22–5.81)
RBC: 4.98 MIL/uL (ref 4.22–5.81)
RDW: 12.5 % (ref 11.5–15.5)
RDW: 12.3 % (ref 11.5–15.5)
WBC: 9.5 10*3/uL (ref 4.0–10.5)
WBC: 8.8 10*3/uL (ref 4.0–10.5)

## 2015-03-28 LAB — BASIC METABOLIC PANEL
Anion gap: 10 (ref 5–15)
BUN: 12 mg/dL (ref 6–20)
CHLORIDE: 104 mmol/L (ref 101–111)
CO2: 30 mmol/L (ref 22–32)
Calcium: 9.4 mg/dL (ref 8.9–10.3)
Creatinine, Ser: 0.94 mg/dL (ref 0.61–1.24)
GFR calc Af Amer: >60 mL/min (ref >60)
GLUCOSE: 106 mg/dL — AB (ref 65–99)
POTASSIUM: 3.6 mmol/L (ref 3.5–5.1)
Sodium: 144 mmol/L (ref 135–145)

## 2015-03-28 LAB — TSH: TSH: 0.512 u[IU]/mL (ref 0.350–4.500)

## 2015-03-28 LAB — PROTIME-INR
INR: 0.98 (ref 0.00–1.49)
Prothrombin Time: 13.2 seconds (ref 11.6–15.2)

## 2015-03-28 LAB — BRAIN NATRIURETIC PEPTIDE
B Natriuretic Peptide: 788.2 pg/mL — ABNORMAL HIGH (ref 0.0–100.0)
B Natriuretic Peptide: 740.7 pg/mL — ABNORMAL HIGH (ref 0.0–100.0)

## 2015-03-28 LAB — APTT: aPTT: 29 seconds (ref 24–37)

## 2015-03-28 LAB — TROPONIN I
TROPONIN I: 0.12 ng/mL — AB (ref <0.031)
Troponin I: 0.12 ng/mL — ABNORMAL HIGH (ref <0.031)

## 2015-03-28 LAB — MAGNESIUM: Magnesium: 1.5 mg/dL — ABNORMAL LOW (ref 1.7–2.4)

## 2015-03-28 MED ORDER — NITROGLYCERIN IN D5W 200-5 MCG/ML-% IV SOLN
0.0000 ug/min | Freq: Once | INTRAVENOUS | Status: AC
  Administered 2015-03-28: 5 ug/min via INTRAVENOUS
  Filled 2015-03-28: qty 250

## 2015-03-28 MED ORDER — FUROSEMIDE 10 MG/ML IJ SOLN
60.0000 mg | Freq: Every day | INTRAMUSCULAR | Status: DC
  Filled 2015-03-28: qty 8

## 2015-03-28 MED ORDER — SODIUM CHLORIDE 0.9 % IV SOLN: 250.0000 mL | INTRAVENOUS | Status: DC | PRN

## 2015-03-28 MED ORDER — FUROSEMIDE 10 MG/ML IJ SOLN
60.0000 mg | Freq: Once | INTRAMUSCULAR | Status: AC
  Administered 2015-03-28: 60 mg via INTRAVENOUS
  Filled 2015-03-28: qty 8

## 2015-03-28 MED ORDER — POTASSIUM CHLORIDE CRYS ER 10 MEQ PO TBCR
10.0000 meq | EXTENDED_RELEASE_TABLET | Freq: Every day | ORAL | Status: DC
  Administered 2015-03-28 – 2015-03-29 (×2): 10 meq via ORAL
  Filled 2015-03-28 (×2): qty 1

## 2015-03-28 MED ORDER — LISINOPRIL 2.5 MG PO TABS
2.5000 mg | ORAL_TABLET | Freq: Every day | ORAL | Status: DC
  Filled 2015-03-28 (×2): qty 1

## 2015-03-28 MED ORDER — ACETAMINOPHEN 325 MG PO TABS: 650.0000 mg | ORAL_TABLET | ORAL | Status: DC | PRN

## 2015-03-28 MED ORDER — NITROPRUSSIDE SODIUM 25 MG/ML IV SOLN
0.0000 ug/kg/min | INTRAVENOUS | Status: DC
  Administered 2015-03-28: 0.3 ug/kg/min via INTRAVENOUS
  Administered 2015-03-29: 1 ug/kg/min via INTRAVENOUS
  Administered 2015-03-30: 0.8 ug/kg/min via INTRAVENOUS
  Filled 2015-03-28 (×7): qty 2

## 2015-03-28 MED ORDER — ASPIRIN EC 81 MG PO TBEC
81.0000 mg | DELAYED_RELEASE_TABLET | Freq: Every day | ORAL | Status: DC
  Administered 2015-03-29 – 2015-04-02 (×5): 81 mg via ORAL
  Filled 2015-03-28 (×6): qty 1

## 2015-03-28 MED ORDER — ENOXAPARIN SODIUM 40 MG/0.4ML ~~LOC~~ SOLN
40.0000 mg | SUBCUTANEOUS | Status: DC
  Administered 2015-03-28 – 2015-04-01 (×5): 40 mg via SUBCUTANEOUS
  Filled 2015-03-28 (×6): qty 0.4

## 2015-03-28 MED ORDER — SODIUM CHLORIDE 0.9 % IJ SOLN
3.0000 mL | Freq: Two times a day (BID) | INTRAMUSCULAR | Status: DC
  Administered 2015-03-29 – 2015-04-02 (×7): 3 mL via INTRAVENOUS

## 2015-03-28 MED ORDER — SODIUM CHLORIDE 0.9 % IJ SOLN: 3.0000 mL | INTRAMUSCULAR | Status: DC | PRN

## 2015-03-28 MED ORDER — NITROGLYCERIN 0.4 MG SL SUBL
0.8000 mg | SUBLINGUAL_TABLET | Freq: Once | SUBLINGUAL | Status: AC
  Administered 2015-03-28: 0.4 mg via SUBLINGUAL
  Filled 2015-03-28: qty 2

## 2015-03-28 MED ORDER — ONDANSETRON HCL 4 MG/2ML IJ SOLN: 4.0000 mg | Freq: Four times a day (QID) | INTRAMUSCULAR | Status: DC | PRN

## 2015-03-28 NOTE — ED Notes (Signed)
Hospitalist at bedside 
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
## 2015-03-28 NOTE — ED Notes (Signed)
0.4 mg sublingual nitroglycerin given per order comments.

## 2015-03-28 NOTE — ED Notes (Addendum)
0.4mg  nitroglycerin sublingual given per order
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
## 2015-03-28 NOTE — ED Notes (Signed)
Per pt, states he feels he is getting PNA again-had it last year-states difficulty breathing last night-shallow, labored
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
## 2015-03-29 ENCOUNTER — Inpatient Hospital Stay (HOSPITAL_COMMUNITY): Payer: Medicare Other

## 2015-03-29 DIAGNOSIS — I509 Heart failure, unspecified: Secondary | ICD-10-CM

## 2015-03-29 LAB — BASIC METABOLIC PANEL
ANION GAP: 9 (ref 5–15)
BUN: 13 mg/dL (ref 6–20)
CHLORIDE: 103 mmol/L (ref 101–111)
CO2: 30 mmol/L (ref 22–32)
Calcium: 8.3 mg/dL — ABNORMAL LOW (ref 8.9–10.3)
Creatinine, Ser: 1 mg/dL (ref 0.61–1.24)
GFR calc Af Amer: >60 mL/min (ref >60)
GLUCOSE: 104 mg/dL — AB (ref 65–99)
POTASSIUM: 4 mmol/L (ref 3.5–5.1)
SODIUM: 142 mmol/L (ref 135–145)

## 2015-03-29 LAB — CBC
HCT: 46.4 % (ref 39.0–52.0)
HEMOGLOBIN: 14.1 g/dL (ref 13.0–17.0)
MCH: 29.3 pg (ref 26.0–34.0)
MCHC: 30.4 g/dL (ref 30.0–36.0)
MCV: 96.5 fL (ref 78.0–100.0)
Platelets: 185 10*3/uL (ref 150–400)
RBC: 4.81 MIL/uL (ref 4.22–5.81)
RDW: 12.6 % (ref 11.5–15.5)
WBC: 9.4 10*3/uL (ref 4.0–10.5)

## 2015-03-29 LAB — RAPID URINE DRUG SCREEN, HOSP PERFORMED
AMPHETAMINES: NOT DETECTED
BENZODIAZEPINES: NOT DETECTED
Barbiturates: NOT DETECTED
COCAINE: NOT DETECTED
OPIATES: NOT DETECTED
TETRAHYDROCANNABINOL: POSITIVE — AB

## 2015-03-29 LAB — TROPONIN I
TROPONIN I: 0.11 ng/mL — AB (ref <0.031)
Troponin I: 0.09 ng/mL — ABNORMAL HIGH (ref <0.031)

## 2015-03-29 LAB — MRSA PCR SCREENING: MRSA by PCR: NEGATIVE

## 2015-03-29 MED ORDER — ISOSORB DINITRATE-HYDRALAZINE 20-37.5 MG PO TABS
1.0000 | ORAL_TABLET | Freq: Two times a day (BID) | ORAL | Status: DC
  Administered 2015-03-29: 1 via ORAL
  Filled 2015-03-29 (×2): qty 1

## 2015-03-29 MED ORDER — LORAZEPAM 0.5 MG PO TABS
0.5000 mg | ORAL_TABLET | Freq: Once | ORAL | Status: DC | PRN
  Filled 2015-03-29: qty 1

## 2015-03-29 MED ORDER — MAGNESIUM SULFATE 2 GM/50ML IV SOLN
2.0000 g | Freq: Once | INTRAVENOUS | Status: AC
  Administered 2015-03-29: 2 g via INTRAVENOUS
  Filled 2015-03-29: qty 50

## 2015-03-29 MED ORDER — PERFLUTREN LIPID MICROSPHERE
1.0000 mL | INTRAVENOUS | Status: AC | PRN
Start: 2015-03-29 — End: 2015-03-29
  Administered 2015-03-29: 2 mL via INTRAVENOUS
  Filled 2015-03-29: qty 10

## 2015-03-29 MED ORDER — SALINE SPRAY 0.65 % NA SOLN
1.0000 | NASAL | Status: DC | PRN
  Administered 2015-03-29: 1 via NASAL
  Filled 2015-03-29: qty 44

## 2015-03-29 MED ORDER — PERFLUTREN LIPID MICROSPHERE
INTRAVENOUS | Status: AC
  Filled 2015-03-29: qty 10

## 2015-03-29 MED ORDER — ISOSORB DINITRATE-HYDRALAZINE 20-37.5 MG PO TABS
1.0000 | ORAL_TABLET | Freq: Three times a day (TID) | ORAL | Status: DC
  Administered 2015-03-29 – 2015-04-02 (×12): 1 via ORAL
  Filled 2015-03-29 (×13): qty 1

## 2015-03-29 MED ORDER — LISINOPRIL 10 MG PO TABS
20.0000 mg | ORAL_TABLET | Freq: Every day | ORAL | Status: DC
  Administered 2015-03-29: 20 mg via ORAL
  Filled 2015-03-29: qty 1

## 2015-03-29 MED ORDER — FUROSEMIDE 10 MG/ML IJ SOLN
60.0000 mg | Freq: Two times a day (BID) | INTRAMUSCULAR | Status: DC
  Administered 2015-03-29: 60 mg via INTRAVENOUS
  Filled 2015-03-29: qty 6

## 2015-03-29 NOTE — ED Notes (Signed)
Spoke with Santiago Glad, NP and advised her on patients' troponin and BP.  Keep Santiago Glad, NP informed on BP, especially if systolic increases.  Santiago Glad, NP was also advised that patient seemed restless and Ativan (one time order) was placed but patient requested medication after CPAP was in place.  Respiratory was called a second time for CPAP by Art, Network engineer.

## 2015-03-29 NOTE — Progress Notes (Signed)
RT placed patient on CPAP. Patient setting is auto 8-20 cmH2O. Sterile water added to water chamber for humidification. 6 liters oxygen bleed into tubing. Patient is tolerating well. RT will continue to monitor.

## 2015-03-29 NOTE — Progress Notes (Signed)
  Echocardiogram 2D Echocardiogram with Definity has been performed.  Jaylon Boylen 03/29/2015, 11:09 AM

## 2015-03-29 NOTE — Progress Notes (Signed)
[facility] PROGRESS NOTE  Jerry [facility] [id number] DOB: 1971/03/11 DOA: 03/28/2015 PCP: [name], MD  Brief narrative 45 year old male with osteogenesis imperfecta, hypertension, nonischemic cardiomyopathy with EF of 20-25%, OSA on CPAP presented to the ED with several days of progressively worsening shortness of breath with exertion followed by dyspnea at rest with orthopnea, cough and chest tightness. In the ED patient found to be in CHF exacerbation with hypertensive urgency and placed on nitroglycerin drip and then switched to IV nitroprusside. He also had mildly elevated troponin. Seen by cardiology and recommend diuresis and continue nitroglycerin drip with step down monitoring.   Assessment/Plan: Acute hypoxic respiratory failure Secondary to acute on chronic combined systolic and diastolic CHF. Being diuresed with IV Lasix. Monitor strict I/O and daily weight.   Acute decompensated congestive heart failure Has history of nonischemic cardiomyopathy with cardiac cath in 2015 showing no significant coronary artery disease but severely elevated LV filling pressure and pulmonary hypertension. Also had cardiac MRI done in the past showing EF of 30% with global hypokinesis. Recommended for outpatient endocardial biopsy. Currently being diabetes with IV Lasix 60 mg twice daily. Net -1.5 L since admission. Continue aspirin and lisinopril.  2-D echo repeated showed septal apical and inferior wall hypokinesis with EF of 30-35%. -Holding beta blocker due to acute decompensated CHF continue IV Lasix and ACE inhibitor. Appreciate cardiology follow-up.  Hypertensive urgency Continue nitroprusside drip. Blood pressure much improved. Added BiDil, Lasix and lisinopril dose increased.wean off and pressure drip as tolerated. If still on the drip will need #at level drawn in the morning.  Elevated troponin Secondary to dementia May. No chest pain symptoms.  OSA Continue nighttime  CPAP  Hypomagnesemia Replenish  Diet: Heart healthy DVT prophylaxis: Subcutaneous Lovenox  Code Status: Full code Family Communication: None at bedside Disposition Plan: Continue ICU monitoring   Consultants:  cardiology  Procedures:  2-D echo  Antibiotics:  None  HPI/Subjective: Seen and examined. Patient on CPAP. He pushes breathing to be slightly better.  Objective: Filed Vitals:   03/29/15 1300 03/29/15 1340  BP: 152/83 162/78  Pulse:  96  Temp:  98.6 F (37 C)  Resp:  24    Intake/Output Summary (Last 24 hours) at 03/29/15 1347 Last data filed at 03/29/15 1300  Gross per 24 hour  Intake      0 ml  Output   1380 ml  Net  -1380 ml   Filed Weights   03/28/15 1659 03/29/15 1340  Weight: 96.299 kg (212 lb 4.8 oz) 95.9 kg (211 lb 6.7 oz)    Exam:   General:  Middle aged male not in distress,   HEENT: On CPAP  Chest: Diminished bibasilar breath sounds  CVS: Normal S1 and S2, no murmurs rub or gallop  GI: Soft, nondistended, nontender, bowel sounds present  Musculoskeletal: Short extremities, no edema  CNS: Alert and oriented  Data Reviewed: Basic Metabolic Panel:  Recent Labs Lab 03/28/15 1222 03/28/15 1848 03/29/15 0525  NA 144 145 142  K 3.6 3.6 4.0  CL 104 103 103  CO2 30 31 30   GLUCOSE 106* 102* 104*  BUN 12 15 13   CREATININE 0.94 1.17 1.00  CALCIUM 9.4 8.9 8.3*  MG  --  1.5*  --    Liver Function Tests:  Recent Labs Lab 03/28/15 1848  AST 39  ALT 33  ALKPHOS 66  BILITOT 1.3*  PROT 7.4  ALBUMIN 3.4*   No results for input(s): LIPASE, AMYLASE in the last 168 hours.  No results for input(s): AMMONIA in the last 168 hours. CBC:  Recent Labs Lab 03/28/15 1222 03/28/15 1848 03/29/15 0525  WBC 9.5 8.8 9.4  HGB 16.0 14.6 14.1  HCT 53.0* 46.4 46.4  MCV 95.0 93.2 96.5  PLT 218 180 185   Cardiac Enzymes:  Recent Labs Lab 03/28/15 1222 03/28/15 1848 03/29/15 0121 03/29/15 0528  TROPONINI 0.12* 0.12* 0.11*  0.09*   BNP (last 3 results)  Recent Labs  03/28/15 1221 03/28/15 1848  BNP 788.2* 740.7*    ProBNP (last 3 results) No results for input(s): PROBNP in the last 8760 hours.  CBG: No results for input(s): GLUCAP in the last 168 hours.  No results found for this or any previous visit (from the past 240 hour(s)).   Studies: Dg Chest 2 View  03/28/2015  CLINICAL DATA:  Shortness of breath EXAM: CHEST  2 VIEW COMPARISON:  04/08/2013 FINDINGS: Chronic cardiomegaly and aortic tortuosity. No aortic aneurysm on chest CT 04/18/2013 Pulmonary venous congestion accentuated by low volumes. There is no edema, consolidation, effusion, or pneumothorax. Scoliosis with innumerable compression fractures of the thoracic and lumbar spine. Patient has history of osteogenesis imperfecta. IMPRESSION: 1. Stable exam.  No evidence of acute disease. 2. Chronic cardiomegaly, aortic tortuosity, and venous congestion. Electronically Signed   By: Monte Fantasia M.D.   On: 03/28/2015 10:52    Scheduled Meds: . aspirin  81 mg Oral Daily  . enoxaparin (LOVENOX) injection  40 mg Subcutaneous Q24H  . furosemide  60 mg Intravenous BID  . isosorbide-hydrALAZINE  1 tablet Oral BID  . lisinopril  20 mg Oral Daily  . potassium chloride  10 mEq Oral Daily  . sodium chloride  3 mL Intravenous Q12H   Continuous Infusions: . nitroPRUSSide 0.6 mcg/kg/min (03/29/15 1346)      Time spent: 25 minutes    Catelynn Sparger, [place]  [facility] Pager [phone] 7PM-7AM, please contact night-coverage at [email or web address], password [facility] 03/29/2015, 1:47 PM  LOS: 1 day

## 2015-03-29 NOTE — Progress Notes (Signed)
Patient: Jerry [facility] / Admit Date: 03/28/2015 / Date of Encounter: 03/29/2015, 8:55 AM   Subjective: Sleeping on CPAP when I entered the room. Easily arousable from sleep. Says his breathing is lot better but not 100%.   Objective: Telemetry: sinus tach around 100bpm Physical Exam: Blood pressure 147/89, pulse 100, temperature 98 F (36.7 C), temperature source Oral, resp. rate 23, height 4\' 9"  (1.448 m), weight 212 lb 4.8 oz (96.299 kg), SpO2 97 %. General: Well developed, well nourished, in no acute distress, short stature Head: Normocephalic, atraumatic, sclera non-icteric, no xanthomas, nares are without discharge. Neck: JVP difficult to assess given neck habitus  Lungs: Diminished throughout. No wheezes, rales or rhonchi. Breathing is unlabored. Heart: Reg rhythm with mildly elevated rate, S1 S2 without murmurs, rubs, or gallops.  Abdomen: Soft, non-tender, non-distended with normoactive bowel sounds. No rebound/guarding. Extremities: No clubbing or cyanosis. Trace-1+ BLE edema. Distal pedal pulses are 2+ and equal bilaterally. Neuro: Alert and oriented X 3. Moves all extremities spontaneously. Psych:  Responds to questions appropriately with a normal affect.   Intake/Output Summary (Last 24 hours) at 03/29/15 0855 Last data filed at 03/28/15 1553  Gross per 24 hour  Intake      0 ml  Output   1530 ml  Net  -1530 ml    Inpatient Medications:  . aspirin  81 mg Oral Daily  . enoxaparin (LOVENOX) injection  40 mg Subcutaneous Q24H  . furosemide  60 mg Intravenous Daily  . lisinopril  2.5 mg Oral Daily  . potassium chloride  10 mEq Oral Daily  . sodium chloride  3 mL Intravenous Q12H   Infusions:  . sodium chloride    . nitroPRUSSide 1 mcg/kg/min (03/29/15 0131)    Labs:  Recent Labs  03/28/15 1848 03/29/15 0525  NA 145 142  K 3.6 4.0  CL 103 103  CO2 31 30  GLUCOSE 102* 104*  BUN 15 13  CREATININE 1.17 1.00  CALCIUM 8.9 8.3*  MG 1.5*  --     Recent Labs  03/28/15 1848  AST 39  ALT 33  ALKPHOS 66  BILITOT 1.3*  PROT 7.4  ALBUMIN 3.4*    Recent Labs  03/28/15 1848 03/29/15 0525  WBC 8.8 9.4  HGB 14.6 14.1  HCT 46.4 46.4  MCV 93.2 96.5  PLT 180 185    Recent Labs  03/28/15 1222 03/28/15 1848 03/29/15 0121 03/29/15 0528  TROPONINI 0.12* 0.12* 0.11* 0.09*   Invalid input(s): POCBNP No results for input(s): HGBA1C in the last 72 hours.   Radiology/Studies:  Dg Chest 2 View  03/28/2015  CLINICAL DATA:  Shortness of breath EXAM: CHEST  2 VIEW COMPARISON:  04/08/2013 FINDINGS: Chronic cardiomegaly and aortic tortuosity. No aortic aneurysm on chest CT 04/18/2013 Pulmonary venous congestion accentuated by low volumes. There is no edema, consolidation, effusion, or pneumothorax. Scoliosis with innumerable compression fractures of the thoracic and lumbar spine. Patient has history of osteogenesis imperfecta. IMPRESSION: 1. Stable exam.  No evidence of acute disease. 2. Chronic cardiomegaly, aortic tortuosity, and venous congestion. Electronically Signed   By: Monte Fantasia M.D.   On: 03/28/2015 10:52     Assessment and Plan  [name] with osteogenesis imperfecta, morbid obesity (BMI 45.9), HTN, NICM/chronic systolic CHF EF 0000000 (cath 2015: no sig CAD, + severely elevated LV filling pressures and pulm HTN). Seen by AHF team with cMRI showing EF 38%, global HK, mild LVH, mild-mod RV dysfunction, possible infiltrative disease. Serum ACEI normal and no evidence  of sarcoid on CT. Outpatient endocardial bx recommended but never performed. Admitted 03/28/15 with DOE/orthopnea in setting of dietary noncompliance nad accelerated HTN.   1. Acute on chronic combined CHF with pulm HTN - continue current regimen. Will review plan for diuretics/nitroprusside with Dr. Debara Pickett. BP responded better to nitroprusside than IV NTG. Remains with low grade sinus tach. Beta blocker on hold given acute decompensation. Change diet from regular to low sodium/fluid  restricted heart failure diet.  2. Elevated troponin - likely due to HTN/CHF. Flat trend. Historically normal coronaries. Do not anticipate further ischemic workup as inpatient.  3. Accelerated HTN - currently on lisinopril, Lasix, and nitroprusside. See above. Check UDS (prior THC use noted).  Signed, Melina Copa PA-C Pager: [phone]

## 2015-03-29 NOTE — Progress Notes (Signed)
Called by nurse that BP is creeping back up - [id number] systolic this PM. Was able to get normotensive in ER earlier (down to 115/86 even). Nipride drip titrated back up. D/w Dr. Percival Spanish who agrees that we should increase Bidil to TID dosing with middle dose given now and follow BP. Nurse aware. Per Dr. Lysbeth Penner plan, thiocyanate level ordered for AM. Melina Copa PA-C

## 2015-03-29 NOTE — ED Notes (Signed)
Report given to [facility], Therapist, sports.

## 2015-03-29 NOTE — Progress Notes (Signed)
ED CM reviewed pt chart to include labs, meds, imaging, notes  03/29/15 pt continue with low O2 sats 78-97%  HR 76-116  On admission to ED positive troponin Refer to CV consult 03/29/15 at 1024 /2017 10:24 AM

## 2015-03-29 NOTE — ED Notes (Signed)
Santiago Glad, NP was notified of rate change/max dosage and no improvement in systolic BP according to parameters.  Santiago Glad, NP wanted to be notified of BP but no changes made; Order placed for CPAP upon patient request.  Respiratory notified by Art, Network engineer.

## 2015-03-30 DIAGNOSIS — I161 Hypertensive emergency: Secondary | ICD-10-CM | POA: Insufficient documentation

## 2015-03-30 LAB — BASIC METABOLIC PANEL
ANION GAP: 10 (ref 5–15)
BUN: 16 mg/dL (ref 6–20)
CALCIUM: 8.1 mg/dL — AB (ref 8.9–10.3)
CO2: 31 mmol/L (ref 22–32)
Chloride: 102 mmol/L (ref 101–111)
Creatinine, Ser: 0.81 mg/dL (ref 0.61–1.24)
GFR calc Af Amer: >60 mL/min (ref >60)
GLUCOSE: 107 mg/dL — AB (ref 65–99)
Potassium: 3.3 mmol/L — ABNORMAL LOW (ref 3.5–5.1)
Sodium: 143 mmol/L (ref 135–145)

## 2015-03-30 MED ORDER — FUROSEMIDE 10 MG/ML IJ SOLN
80.0000 mg | Freq: Two times a day (BID) | INTRAMUSCULAR | Status: DC
  Administered 2015-03-30 – 2015-04-01 (×4): 80 mg via INTRAVENOUS
  Filled 2015-03-30 (×4): qty 8

## 2015-03-30 MED ORDER — POTASSIUM CHLORIDE CRYS ER 20 MEQ PO TBCR
40.0000 meq | EXTENDED_RELEASE_TABLET | Freq: Once | ORAL | Status: AC
  Administered 2015-03-30: 40 meq via ORAL
  Filled 2015-03-30: qty 2

## 2015-03-30 MED ORDER — CARVEDILOL 6.25 MG PO TABS
6.2500 mg | ORAL_TABLET | Freq: Two times a day (BID) | ORAL | Status: DC
  Administered 2015-03-30 (×2): 6.25 mg via ORAL
  Filled 2015-03-30 (×2): qty 1

## 2015-03-30 MED ORDER — LISINOPRIL 40 MG PO TABS
40.0000 mg | ORAL_TABLET | Freq: Every day | ORAL | Status: DC
  Administered 2015-03-30 – 2015-04-02 (×4): 40 mg via ORAL
  Filled 2015-03-30 (×2): qty 4
  Filled 2015-03-30 (×2): qty 1

## 2015-03-30 MED ORDER — POTASSIUM CHLORIDE CRYS ER 20 MEQ PO TBCR
40.0000 meq | EXTENDED_RELEASE_TABLET | Freq: Two times a day (BID) | ORAL | Status: DC
  Administered 2015-03-30 – 2015-03-31 (×4): 40 meq via ORAL
  Filled 2015-03-30 (×4): qty 2

## 2015-03-30 NOTE — Progress Notes (Signed)
Pt stated that he will self administer CPAP when ready for bed.  RT to monitor and assess as needed.  

## 2015-03-30 NOTE — Progress Notes (Signed)
Cardiologist: [name] Subjective:  Feels much better. Breathing much better. No CP, no dizziness. Neuro intact.  Objective:  Vital Signs in the last 24 hours: Temp:  [98.3 F (36.8 C)-98.7 F (37.1 C)] 98.5 F (36.9 C) (01/07 0424) Pulse Rate:  [78-119] 96 (01/07 0600) Resp:  [16-39] 25 (01/07 0600) BP: (115-183)/(59-108) 174/84 mmHg (01/07 0600) SpO2:  [81 %-98 %] 90 % (01/07 0600) Weight:  [211 lb 6.7 oz (95.9 kg)-216 lb 4.3 oz (98.1 kg)] 216 lb 4.3 oz (98.1 kg) (01/07 0425)  Intake/Output from previous day: 01/06 0701 - 01/07 0700 In: 871.6 [I.V.:871.6] Out: 1975 [Urine:1975]   Physical Exam: General: Well nourished, in no acute distress, OI staue. Head:  Normocephalic and atraumatic. Lungs: Clear to auscultation and percussion. Heart: Normal S1 and S2, Reg tachy.  No murmur, rubs or gallops.  Abdomen: soft, non-tender, positive bowel sounds. Overweight Extremities: No clubbing or cyanosis. trace edema. Neurologic: Alert and oriented x 3.    Lab Results:  Recent Labs  03/28/15 1848 03/29/15 0525  WBC 8.8 9.4  HGB 14.6 14.1  PLT 180 185    Recent Labs  03/29/15 0525 03/30/15 0330  NA 142 143  K 4.0 3.3*  CL 103 102  CO2 30 31  GLUCOSE 104* 107*  BUN 13 16  CREATININE 1.00 0.81    Recent Labs  03/29/15 0121 03/29/15 0528  TROPONINI 0.11* 0.09*   Hepatic Function Panel  Recent Labs  03/28/15 1848  PROT 7.4  ALBUMIN 3.4*  AST 39  ALT 33  ALKPHOS 66  BILITOT 1.3*   Imaging: Dg Chest 2 View  03/28/2015  CLINICAL DATA:  Shortness of breath EXAM: CHEST  2 VIEW COMPARISON:  04/08/2013 FINDINGS: Chronic cardiomegaly and aortic tortuosity. No aortic aneurysm on chest CT 04/18/2013 Pulmonary venous congestion accentuated by low volumes. There is no edema, consolidation, effusion, or pneumothorax. Scoliosis with innumerable compression fractures of the thoracic and lumbar spine. Patient has history of osteogenesis imperfecta. IMPRESSION: 1. Stable  exam.  No evidence of acute disease. 2. Chronic cardiomegaly, aortic tortuosity, and venous congestion. Electronically Signed   By: Monte Fantasia M.D.   On: 03/28/2015 10:52   Personally viewed.   Telemetry: Sinus tachy 105 Personally viewed.   EKG:  Sinus tach Personally viewed.  Cardiac Studies:  ECHO 03/28/14- EF 35%  Meds: Scheduled Meds: . aspirin  81 mg Oral Daily  . carvedilol  6.25 mg Oral BID WC  . enoxaparin (LOVENOX) injection  40 mg Subcutaneous Q24H  . furosemide  80 mg Intravenous BID  . isosorbide-hydrALAZINE  1 tablet Oral TID  . lisinopril  40 mg Oral Daily  . potassium chloride  40 mEq Oral Once  . potassium chloride  40 mEq Oral BID  . sodium chloride  3 mL Intravenous Q12H   Continuous Infusions: . nitroPRUSSide 0.8 mcg/kg/min (03/30/15 0400)   PRN Meds:.sodium chloride, acetaminophen, LORazepam, ondansetron (ZOFRAN) IV, sodium chloride, sodium chloride  Assessment/Plan:  Principal Problem:   Acute respiratory failure with hypoxemia (HCC) Active Problems:   Acute on chronic combined systolic and diastolic CHF, NYHA class 4 (HCC)   Elevated troponin   Accelerated hypertension   Acute respiratory failure with hypoxia (HCC)  - Weaning nitroprusside - Starting coreg 6.25 BID (25 BID prior at home-compliance) - Lasix 80IV BID - KDUR 40 BID -BMET in AM - Keep in unit today while we wean off nitroprusside - Troponin flat - demand ischemia  Candee Furbish, MD    Marlou Porch,  [place] 03/30/2015, 8:09 AM

## 2015-03-30 NOTE — Progress Notes (Signed)
PROGRESS NOTE  Jerry Edwards [id number] DOB: May 25, 1970 DOA: 03/28/2015 PCP: [name], MD  Brief narrative 45 year old male with osteogenesis imperfecta, hypertension, nonischemic cardiomyopathy with EF of 20-25%, OSA on CPAP presented to the ED with several days of progressively worsening shortness of breath with exertion followed by dyspnea at rest with orthopnea, cough and chest tightness. In the ED patient found to be in CHF exacerbation with hypertensive urgency and placed on nitroglycerin drip and then switched to IV nitroprusside. He also had mildly elevated troponin. Seen by cardiology and recommend diuresis and continue nitroglycerin drip with step down monitoring.  HPI/Subjective: - patient without complaints this morning, denies chest pain / palpitations, shortness of breath improved.   Assessment/Plan: Acute hypoxic respiratory failure - Secondary to acute on chronic combined systolic and diastolic CHF.  - Being diuresed with IV Lasix. Monitor strict I/O and daily weight.  Acute decompensated congestive heart failure - Has history of nonischemic cardiomyopathy with cardiac cath in 2015 showing no significant coronary artery disease but severely elevated LV filling pressure and pulmonary hypertension. Also had cardiac MRI done in the past showing EF of 30% with global hypokinesis. Recommended for outpatient endocardial biopsy. - Currently being diabetes with IV Lasix 80 mg twice daily. Net -2.6 L since admission. Continue aspirin and lisinopril.  2-D echo repeated showed septal apical and inferior wall hypokinesis with EF of 30-35%. - cardiology following.  Hypertensive urgency - Continue nitroprusside drip, attempt to wean today - Blood pressure overall improved.  - currently on Lasix 80 BID, Coreg 6.25 BID, Bidil 20-37.5 TID, Lisinopril 40 daily  Elevated troponin - Secondary to demand.  - No chest pains  OSA - Continue nighttime CPAP  Hypomagnesemia -  Replenish  Diet: Heart healthy DVT prophylaxis: Subcutaneous Lovenox  Code Status: Full code Family Communication: None at bedside Disposition Plan: Continue ICU monitoring   Consultants:  Cardiology  Procedures:  2-D echo Study Conclusions - Left ventricle: Septal apical and inferior wall hypokinesis. Thecavity size was moderately dilated. Wall thickness was normal.Systolic function was moderately to severely reduced. Theestimated ejection fraction was in the range of 30% to 35%. - Mitral valve: There was mild regurgitation. - Atrial septum: No defect or patent foramen ovale was identified.  Antibiotics:  None  Objective: Filed Vitals:   03/30/15 0500 03/30/15 0600  BP: 129/72 174/84  Pulse: 101 96  Temp:    Resp: 24 25    Intake/Output Summary (Last 24 hours) at 03/30/15 0646 Last data filed at 03/30/15 0600  Gross per 24 hour  Intake 871.64 ml  Output   1975 ml  Net -1103.36 ml   Filed Weights   03/28/15 1659 03/29/15 1340 03/30/15 0425  Weight: 96.299 kg (212 lb 4.8 oz) 95.9 kg (211 lb 6.7 oz) 98.1 kg (216 lb 4.3 oz)   Exam:  General:  Middle aged male not in distress  HEENT: On CPAP  Chest: Diminished bibasilar breath sounds  CVS: Normal S1 and S2, no murmurs rub or gallop  GI: Soft, nondistended, nontender, bowel sounds present  Musculoskeletal: Short extremities, no edema  CNS: Alert and oriented, non focal   Data Reviewed: Basic Metabolic Panel:  Recent Labs Lab 03/28/15 1222 03/28/15 1848 03/29/15 0525 03/30/15 0330  NA 144 145 142 143  K 3.6 3.6 4.0 3.3*  CL 104 103 103 102  CO2 30 31 30 31   GLUCOSE 106* 102* 104* 107*  BUN 12 15 13 16   CREATININE 0.94 1.17 1.00 0.81  CALCIUM  9.4 8.9 8.3* 8.1*  MG  --  1.5*  --   --    Liver Function Tests:  Recent Labs Lab 03/28/15 1848  AST 39  ALT 33  ALKPHOS 66  BILITOT 1.3*  PROT 7.4  ALBUMIN 3.4*   CBC:  Recent Labs Lab 03/28/15 1222 03/28/15 1848 03/29/15 0525  WBC  9.5 8.8 9.4  HGB 16.0 14.6 14.1  HCT 53.0* 46.4 46.4  MCV 95.0 93.2 96.5  PLT 218 180 185   Cardiac Enzymes:  Recent Labs Lab 03/28/15 1222 03/28/15 1848 03/29/15 0121 03/29/15 0528  TROPONINI 0.12* 0.12* 0.11* 0.09*   BNP (last 3 results)  Recent Labs  03/28/15 1221 03/28/15 1848  BNP 788.2* 740.7*   Recent Results (from the past 240 hour(s))  MRSA PCR Screening     Status: None   Collection Time: 03/29/15  1:18 PM  Result Value Ref Range Status   MRSA by PCR NEGATIVE NEGATIVE Final    Comment:        The GeneXpert MRSA Assay (FDA approved for NASAL specimens only), is one component of a comprehensive MRSA colonization surveillance program. It is not intended to diagnose MRSA infection nor to guide or monitor treatment for MRSA infections.      Studies: Dg Chest 2 View  03/28/2015  CLINICAL DATA:  Shortness of breath EXAM: CHEST  2 VIEW COMPARISON:  04/08/2013 FINDINGS: Chronic cardiomegaly and aortic tortuosity. No aortic aneurysm on chest CT 04/18/2013 Pulmonary venous congestion accentuated by low volumes. There is no edema, consolidation, effusion, or pneumothorax. Scoliosis with innumerable compression fractures of the thoracic and lumbar spine. Patient has history of osteogenesis imperfecta. IMPRESSION: 1. Stable exam.  No evidence of acute disease. 2. Chronic cardiomegaly, aortic tortuosity, and venous congestion. Electronically Signed   By: Monte Fantasia M.D.   On: 03/28/2015 10:52    Scheduled Meds: . aspirin  81 mg Oral Daily  . enoxaparin (LOVENOX) injection  40 mg Subcutaneous Q24H  . furosemide  60 mg Intravenous BID  . isosorbide-hydrALAZINE  1 tablet Oral TID  . lisinopril  20 mg Oral Daily  . potassium chloride  10 mEq Oral Daily  . sodium chloride  3 mL Intravenous Q12H   Continuous Infusions: . nitroPRUSSide 0.8 mcg/kg/min (03/30/15 0400)   Marzetta Board MD [facility] Pager [phone]  If 7PM-7AM, please contact night-coverage  at [email or web address], password [facility] 03/30/2015, 6:46 AM  LOS: 2 days

## 2015-03-31 DIAGNOSIS — I161 Hypertensive emergency: Secondary | ICD-10-CM

## 2015-03-31 LAB — BASIC METABOLIC PANEL
Anion gap: 6 (ref 5–15)
BUN: 17 mg/dL (ref 6–20)
CALCIUM: 8.7 mg/dL — AB (ref 8.9–10.3)
CHLORIDE: 105 mmol/L (ref 101–111)
CO2: 31 mmol/L (ref 22–32)
CREATININE: 0.88 mg/dL (ref 0.61–1.24)
GFR calc non Af Amer: >60 mL/min (ref >60)
GLUCOSE: 103 mg/dL — AB (ref 65–99)
Potassium: 4.5 mmol/L (ref 3.5–5.1)
Sodium: 142 mmol/L (ref 135–145)

## 2015-03-31 MED ORDER — CARVEDILOL 12.5 MG PO TABS
12.5000 mg | ORAL_TABLET | Freq: Two times a day (BID) | ORAL | Status: DC
  Administered 2015-03-31 – 2015-04-01 (×3): 12.5 mg via ORAL
  Filled 2015-03-31 (×3): qty 1

## 2015-03-31 NOTE — Progress Notes (Signed)
Pt awake, sitting up at the side of the bed.  Pt requested that I come back and place him on cpap around 0100.  RT will return later at that time.

## 2015-03-31 NOTE — Progress Notes (Signed)
PROGRESS NOTE  Jerry Edwards [id number] DOB: 04-Sep-1970 DOA: 03/28/2015 PCP: [name], MD  Brief narrative 45 year old male with osteogenesis imperfecta, hypertension, nonischemic cardiomyopathy with EF of 20-25%, OSA on CPAP presented to the ED with several days of progressively worsening shortness of breath with exertion followed by dyspnea at rest with orthopnea, cough and chest tightness. In the ED patient found to be in CHF exacerbation with hypertensive urgency and placed on nitroglycerin drip and then switched to IV nitroprusside. He also had mildly elevated troponin. Seen by cardiology and recommend diuresis and continue nitroglycerin drip with step down monitoring.  HPI/Subjective: - patient comfortable, sitting at the edge of the bed eating breakfast - denies chest pain / palpitations / HA or shortness of breath  Assessment/Plan: Acute hypoxic respiratory failure - Secondary to acute on chronic combined systolic and diastolic CHF.  - Being diuresed with IV Lasix. Monitor strict I/O and daily weights, transition to po Lasix tomorrow  Acute decompensated congestive heart failure - Has history of nonischemic cardiomyopathy with cardiac cath in 2015 showing no significant coronary artery disease but severely elevated LV filling pressure and pulmonary hypertension. Also had cardiac MRI done in the past showing EF of 30% with global hypokinesis. Recommended for outpatient endocardial biopsy. - Currently being diabetes with IV Lasix 80 mg twice daily. Net -3.6 L since admission. Continue aspirin and lisinopril.  - 2-D echo repeated showed septal apical and inferior wall hypokinesis with EF of 30-35%. - cardiology following.  Hypertensive urgency - initially maintained on nitroprusside drip, he was able to be weaned off 1/7.  - Blood pressure overall improved.  - currently on Lasix 80 BID, Bidil 20-37.5 TID, Lisinopril 40 daily. Increase Coreg 6.25 to 12.5 BID - transfer to  floor today  Elevated troponin - Secondary to demand.  - No chest pains  OSA - Continue nighttime CPAP  Hypomagnesemia - Replenish  Diet: Heart healthy DVT prophylaxis: Subcutaneous Lovenox  Code Status: Full code Family Communication: None at bedside Disposition Plan: floor transfer    Consultants:  Cardiology  Procedures:  2-D echo Study Conclusions - Left ventricle: Septal apical and inferior wall hypokinesis. Thecavity size was moderately dilated. Wall thickness was normal.Systolic function was moderately to severely reduced. Theestimated ejection fraction was in the range of 30% to 35%. - Mitral valve: There was mild regurgitation. - Atrial septum: No defect or patent foramen ovale was identified.  Antibiotics:  None  Objective: Filed Vitals:   03/31/15 0815 03/31/15 0900  BP:  146/85  Pulse:  100  Temp: 98.4 F (36.9 C)   Resp:  17    Intake/Output Summary (Last 24 hours) at 03/31/15 1027 Last data filed at 03/31/15 0450  Gross per 24 hour  Intake  63.31 ml  Output   1200 ml  Net -1136.69 ml   Filed Weights   03/30/15 0425 03/31/15 0320 03/31/15 0330  Weight: 98.1 kg (216 lb 4.3 oz) 97.1 kg (214 lb 1.1 oz) 94.5 kg (208 lb 5.4 oz)   Exam:  General:  Middle aged male not in distress  HEENT: no scleral icterus  Chest: Diminished bibasilar breath sounds  CVS: Normal S1 and S2, no murmurs rub or gallop  GI: Soft, nondistended, nontender, bowel sounds present  Musculoskeletal: Short extremities, no edema  Data Reviewed: Basic Metabolic Panel:  Recent Labs Lab 03/28/15 1222 03/28/15 1848 03/29/15 0525 03/30/15 0330 03/31/15 0328  NA 144 145 142 143 142  K 3.6 3.6 4.0 3.3* 4.5  CL  104 103 103 102 105  CO2 30 31 30 31 31   GLUCOSE 106* 102* 104* 107* 103*  BUN 12 15 13 16 17   CREATININE 0.94 1.17 1.00 0.81 0.88  CALCIUM 9.4 8.9 8.3* 8.1* 8.7*  MG  --  1.5*  --   --   --    Liver Function Tests:  Recent Labs Lab  03/28/15 1848  AST 39  ALT 33  ALKPHOS 66  BILITOT 1.3*  PROT 7.4  ALBUMIN 3.4*   CBC:  Recent Labs Lab 03/28/15 1222 03/28/15 1848 03/29/15 0525  WBC 9.5 8.8 9.4  HGB 16.0 14.6 14.1  HCT 53.0* 46.4 46.4  MCV 95.0 93.2 96.5  PLT 218 180 185   Cardiac Enzymes:  Recent Labs Lab 03/28/15 1222 03/28/15 1848 03/29/15 0121 03/29/15 0528  TROPONINI 0.12* 0.12* 0.11* 0.09*   BNP (last 3 results)  Recent Labs  03/28/15 1221 03/28/15 1848  BNP 788.2* 740.7*   Recent Results (from the past 240 hour(s))  MRSA PCR Screening     Status: None   Collection Time: 03/29/15  1:18 PM  Result Value Ref Range Status   MRSA by PCR NEGATIVE NEGATIVE Final    Comment:        The GeneXpert MRSA Assay (FDA approved for NASAL specimens only), is one component of a comprehensive MRSA colonization surveillance program. It is not intended to diagnose MRSA infection nor to guide or monitor treatment for MRSA infections.      Studies: No results found.  Scheduled Meds: . aspirin  81 mg Oral Daily  . carvedilol  12.5 mg Oral BID WC  . enoxaparin (LOVENOX) injection  40 mg Subcutaneous Q24H  . furosemide  80 mg Intravenous BID  . isosorbide-hydrALAZINE  1 tablet Oral TID  . lisinopril  40 mg Oral Daily  . potassium chloride  40 mEq Oral BID  . sodium chloride  3 mL Intravenous Q12H   Continuous Infusions: . nitroPRUSSide Stopped (03/30/15 1654)   Marzetta Board MD [facility] Pager [phone]  If 7PM-7AM, please contact night-coverage at [email or web address], password [facility] 03/31/2015, 10:27 AM  LOS: 3 days

## 2015-03-31 NOTE — Progress Notes (Signed)
Cardiologist: [name]  Brief narrative 45 year old male with osteogenesis imperfecta, hypertension, nonischemic cardiomyopathy with EF of 20-25%, OSA on CPAP presented to the ED with several days of progressively worsening shortness of breath with exertion followed by dyspnea at rest with orthopnea, cough and chest tightness. In the ED patient found to be in CHF exacerbation with hypertensive urgency and placed on nitroglycerin drip and then switched to IV nitroprusside. He also had mildly elevated troponin.   Subjective:  Feels much better. Breathing much better. No CP, no dizziness. Neuro intact. Enjoys music, producing, drum machine.  Objective:  Vital Signs in the last 24 hours: Temp:  [98 F (36.7 C)-99 F (37.2 C)] 98 F (36.7 C) (01/08 0330) Pulse Rate:  [75-107] 76 (01/08 0530) Resp:  [14-30] 14 (01/08 0530) BP: (120-161)/(61-139) 123/104 mmHg (01/08 0530) SpO2:  [88 %-100 %] 95 % (01/08 0530) Weight:  [208 lb 5.4 oz (94.5 kg)-214 lb 1.1 oz (97.1 kg)] 208 lb 5.4 oz (94.5 kg) (01/08 0330)  Intake/Output from previous day: 01/07 0701 - 01/08 0700 In: 129.7 [I.V.:129.7] Out: 1200 [Urine:1200]   Physical Exam: General: Well nourished, in no acute distress, OI staue. Head:  Normocephalic and atraumatic. Lungs: Clear to auscultation and percussion. Heart: Normal S1 and S2, Reg .  No murmur, rubs or gallops.  Abdomen: soft, non-tender, positive bowel sounds. Overweight Extremities: No clubbing or cyanosis. trace edema. Neurologic: Alert and oriented x 3.    Lab Results:  Recent Labs  03/28/15 1848 03/29/15 0525  WBC 8.8 9.4  HGB 14.6 14.1  PLT 180 185    Recent Labs  03/30/15 0330 03/31/15 0328  NA 143 142  K 3.3* 4.5  CL 102 105  CO2 31 31  GLUCOSE 107* 103*  BUN 16 17  CREATININE 0.81 0.88    Recent Labs  03/29/15 0121 03/29/15 0528  TROPONINI 0.11* 0.09*   Hepatic Function Panel  Recent Labs  03/28/15 1848  PROT 7.4  ALBUMIN 3.4*  AST 39   ALT 33  ALKPHOS 66  BILITOT 1.3*   Imaging: No results found. Personally viewed.   Telemetry: Sinus rhythm, no adverse rhythms Personally viewed.   EKG:  Sinus tach Personally viewed.  Cardiac Studies:  ECHO 03/28/14- EF 35%  Meds: Scheduled Meds: . aspirin  81 mg Oral Daily  . carvedilol  12.5 mg Oral BID WC  . enoxaparin (LOVENOX) injection  40 mg Subcutaneous Q24H  . furosemide  80 mg Intravenous BID  . isosorbide-hydrALAZINE  1 tablet Oral TID  . lisinopril  40 mg Oral Daily  . potassium chloride  40 mEq Oral BID  . sodium chloride  3 mL Intravenous Q12H   Continuous Infusions: . nitroPRUSSide Stopped (03/30/15 1654)   PRN Meds:.sodium chloride, acetaminophen, LORazepam, ondansetron (ZOFRAN) IV, sodium chloride, sodium chloride  Assessment/Plan:  Principal Problem:   Acute respiratory failure with hypoxemia (HCC) Active Problems:   Acute on chronic combined systolic and diastolic CHF, NYHA class 4 (HCC)   Elevated troponin   Accelerated hypertension   Acute respiratory failure with hypoxia (HCC)   Hypertensive emergency  Hypertensive emergency - Weaned off nitroprusside on 03/30/15 - Increaseing coreg from 6.25 BID to 12.5 BID (25 BID prior at home-compliance) now that fluid status is improved - Lasix 80IV BID - good output, lets continue today and plan PO tomorrow  Hypokalemia - KDUR 40 BID - replete - BMET in AM  Acute on chronic systolic heart failure  - Lasix  - ACE-I  -  Added back Bb now that pulm edema has improved  - Goal will be coreg 25 BID  - EF 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        Shalise Rosado, [place] 03/31/2015, 6:18 AM

## 2015-04-01 LAB — BASIC METABOLIC PANEL
ANION GAP: 7 (ref 5–15)
BUN: 21 mg/dL — AB (ref 6–20)
CO2: 33 mmol/L — AB (ref 22–32)
Calcium: 9.2 mg/dL (ref 8.9–10.3)
Chloride: 102 mmol/L (ref 101–111)
Creatinine, Ser: 0.9 mg/dL (ref 0.61–1.24)
GFR calc Af Amer: >60 mL/min (ref >60)
GFR calc non Af Amer: >60 mL/min (ref >60)
GLUCOSE: 135 mg/dL — AB (ref 65–99)
POTASSIUM: 4.6 mmol/L (ref 3.5–5.1)
Sodium: 142 mmol/L (ref 135–145)

## 2015-04-01 MED ORDER — POTASSIUM CHLORIDE CRYS ER 20 MEQ PO TBCR: 20.0000 meq | EXTENDED_RELEASE_TABLET | Freq: Two times a day (BID) | ORAL | Status: DC

## 2015-04-01 MED ORDER — POTASSIUM CHLORIDE CRYS ER 20 MEQ PO TBCR
20.0000 meq | EXTENDED_RELEASE_TABLET | Freq: Every day | ORAL | Status: DC
  Administered 2015-04-01 – 2015-04-02 (×2): 20 meq via ORAL
  Filled 2015-04-01 (×2): qty 1

## 2015-04-01 MED ORDER — CARVEDILOL 12.5 MG PO TABS
12.5000 mg | ORAL_TABLET | Freq: Once | ORAL | Status: AC
  Administered 2015-04-01: 12.5 mg via ORAL
  Filled 2015-04-01: qty 1

## 2015-04-01 MED ORDER — CARVEDILOL 25 MG PO TABS
25.0000 mg | ORAL_TABLET | Freq: Two times a day (BID) | ORAL | Status: DC
  Administered 2015-04-01 – 2015-04-02 (×2): 25 mg via ORAL
  Filled 2015-04-01 (×2): qty 1

## 2015-04-01 MED ORDER — FUROSEMIDE 40 MG PO TABS
40.0000 mg | ORAL_TABLET | Freq: Two times a day (BID) | ORAL | Status: DC
  Administered 2015-04-01 – 2015-04-02 (×2): 40 mg via ORAL
  Filled 2015-04-01 (×2): qty 1

## 2015-04-01 NOTE — Progress Notes (Signed)
[facility] PROGRESS NOTE  Jerry Edwards [id number] DOB: [birth year] DOA: 03/28/2015 PCP: [name], MD  Assessment/Plan: 45 y/o male with osteogenesis imperfecta, hypertension, nonischemic cardiomyopathy with EF of 20-25%, OSA on CPAP presented to the ED with several days of progressively worsening shortness of breath with exertion followed by dyspnea at rest with orthopnea, cough and chest tightness. In the ED patient found to be in CHF exacerbation with hypertensive urgency and placed on nitroglycerin drip and then switched to IV nitroprusside. He also had mildly elevated troponin. Seen by cardiology and recommend diuresis and continue nitroglycerin drip with step down monitoring.  1. Acute hypoxic respiratory failure Secondary to acute on chronic combined systolic and diastolic CHF.  -Pt is clinically improved after diuresed with IV Lasix. Monitor strict I/O and daily weights, transitioned to po Lasix  2. Acute decompensated congestive heart failure. Pt Has history of nonischemic cardiomyopathy with cardiac cath in 2015 showing no significant coronary artery disease but severely elevated LV filling pressure and pulmonary hypertension. Also had cardiac MRI done in the past showing EF of 30% with global hypokinesis. Recommended for outpatient endocardial biopsy. - Pt diuresed with IV Lasix 80 mg twice daily. Net -3.6 L since admission. Continue aspirin and lisinopril. BB - 2-D echo repeated showed septal apical and inferior wall hypokinesis with EF of 30-35%. cardiology following. 3. Hypertensive urgency initially maintained on nitroprusside drip, he was able to be weaned off 1/7. Blood pressure overall improved.  - currently on Lasix 40 BID, Bidil 20-37.5 TID, Lisinopril 40 daily. Increase Coreg 6.25 to 12.5 BID 4. Elevated troponin Secondary to demand. No chest pains OSA Continue nighttime CPAP  Disposition: 24-48 hrs. Wean down oxygen    Code Status: full Family  Communication: d/w patient (indicate person spoken with, relationship, and if by phone, the number) Disposition Plan: home 24-48 hrs   Consultants:  Cardiology  Procedures:  2-D echo Study Conclusions - Left ventricle: Septal apical and inferior wall hypokinesis. Thecavity size was moderately dilated. Wall thickness was normal.Systolic function was moderately to severely reduced. Theestimated ejection fraction was in the range of 30% to 35%. - Mitral valve: There was mild regurgitation. - Atrial septum: No defect or patent foramen ovale was identified.  Antibiotics:  None   HPI/Subjective: Alert. No distress  Objective: Filed Vitals:   04/01/15 0113 04/01/15 0445  BP:  136/65  Pulse: 85 91  Temp:  99 F (37.2 C)  Resp: 20 20    Intake/Output Summary (Last 24 hours) at 04/01/15 1033 Last data filed at 04/01/15 0917  Gross per 24 hour  Intake    480 ml  Output   3520 ml  Net  -3040 ml   Filed Weights   03/31/15 0320 03/31/15 0330 04/01/15 0445  Weight: 97.1 kg (214 lb 1.1 oz) 94.5 kg (208 lb 5.4 oz) 96.7 kg (213 lb 3 oz)    Exam:   General:  No distress   Cardiovascular: s1,s2 rrr  Respiratory: no wheezing   Abdomen: soft, obese.   Musculoskeletal: mild pedal edema    Data Reviewed: Basic Metabolic Panel:  Recent Labs Lab 03/28/15 1848 03/29/15 0525 03/30/15 0330 03/31/15 0328 04/01/15 0426  NA 145 142 143 142 142  K 3.6 4.0 3.3* 4.5 4.6  CL 103 103 102 105 102  CO2 31 30 31 31  33*  GLUCOSE 102* 104* 107* 103* 135*  BUN 15 13 16 17  21*  CREATININE 1.17 1.00 0.81 0.88 0.90  CALCIUM 8.9 8.3* 8.1* 8.7* 9.2  MG 1.5*  --   --   --   --    Liver Function Tests:  Recent Labs Lab 03/28/15 1848  AST 39  ALT 33  ALKPHOS 66  BILITOT 1.3*  PROT 7.4  ALBUMIN 3.4*   No results for input(s): LIPASE, AMYLASE in the last 168 hours. No results for input(s): AMMONIA in the last 168 hours. CBC:  Recent Labs Lab 03/28/15 1222 03/28/15 1848  03/29/15 0525  WBC 9.5 8.8 9.4  HGB 16.0 14.6 14.1  HCT 53.0* 46.4 46.4  MCV 95.0 93.2 96.5  PLT 218 180 185   Cardiac Enzymes:  Recent Labs Lab 03/28/15 1222 03/28/15 1848 03/29/15 0121 03/29/15 0528  TROPONINI 0.12* 0.12* 0.11* 0.09*   BNP (last 3 results)  Recent Labs  03/28/15 1221 03/28/15 1848  BNP 788.2* 740.7*    ProBNP (last 3 results) No results for input(s): PROBNP in the last 8760 hours.  CBG: No results for input(s): GLUCAP in the last 168 hours.  Recent Results (from the past 240 hour(s))  MRSA PCR Screening     Status: None   Collection Time: 03/29/15  1:18 PM  Result Value Ref Range Status   MRSA by PCR NEGATIVE NEGATIVE Final    Comment:        The GeneXpert MRSA Assay (FDA approved for NASAL specimens only), is one component of a comprehensive MRSA colonization surveillance program. It is not intended to diagnose MRSA infection nor to guide or monitor treatment for MRSA infections.      Studies: No results found.  Scheduled Meds: . aspirin  81 mg Oral Daily  . carvedilol  25 mg Oral BID WC  . enoxaparin (LOVENOX) injection  40 mg Subcutaneous Q24H  . furosemide  40 mg Oral BID  . isosorbide-hydrALAZINE  1 tablet Oral TID  . lisinopril  40 mg Oral Daily  . potassium chloride  20 mEq Oral Daily  . sodium chloride  3 mL Intravenous Q12H   Continuous Infusions:   Principal Problem:   Acute respiratory failure with hypoxemia (HCC) Active Problems:   Acute on chronic combined systolic and diastolic CHF, NYHA class 4 (HCC)   Elevated troponin   Accelerated hypertension   Acute respiratory failure with hypoxia [facility])   Hypertensive emergency    Time spent: >35 minutes     Kinnie Feil  [facility] Pager [phone]. If 7PM-7AM, please contact night-coverage at [email or web address], password [email or web address] 04/01/2015, 10:33 AM  LOS: 4 days

## 2015-04-01 NOTE — Care Management Note (Signed)
Case Management Note  Patient Details  Name: Jerry Edwards MRN: [id number] Date of Birth: 08-19-1970  Subjective/Objective: 45 y/o m admitted w/Acute resp failure. From home. Noted on 02. If home 02 needed can arrange w/qualifying 02 sats, & home 02 order.                   Action/Plan:d/c plan home.   Expected Discharge Date:   (unknown)               Expected Discharge Plan:  Home/Self Care  In-House Referral:     Discharge planning Services  CM Consult  Post Acute Care Choice:    Choice offered to:     DME Arranged:    DME Agency:     HH Arranged:    HH Agency:     Status of Service:  In process, will continue to follow  Medicare Important Message Given:    Date Medicare IM Given:    Medicare IM give by:    Date Additional Medicare IM Given:    Additional Medicare Important Message give by:     If discussed at [place] of Stay Meetings, dates discussed:    Additional Comments:  Dessa Phi, RN 04/01/2015, 4:28 PM

## 2015-04-01 NOTE — Progress Notes (Signed)
Patient: Jerry Edwards / Admit Date: 03/28/2015 / Date of Encounter: 04/01/2015, 7:44 AM   Subjective: Feeling much better day by day - pretty close to "normal." Remains on O2.   Objective: Telemetry: NSR Physical Exam: Blood pressure 136/65, pulse 91, temperature 99 F (37.2 C), temperature source Oral, resp. rate 20, height 4\' 10"  (1.473 m), weight 213 lb 3 oz (96.7 kg), SpO2 99 %. General: Well developed, well nourished, in no acute distress, short stature Head: Normocephalic, atraumatic, sclera non-icteric, no xanthomas, nares are without discharge. Neck: JVP difficult to assess given neck habitus  Lungs: Diminished throughout but no wheezes, rales or rhonchi. Breathing is unlabored. Heart: Reg rhythm with mildly elevated rate, S1 S2 without murmurs, rubs, or gallops.  Abdomen: Soft, non-tender, non-distended with normoactive bowel sounds. No rebound/guarding. Extremities: No clubbing or cyanosis. Trace BLE edema. Distal pedal pulses are 2+ and equal bilaterally. Neuro: Alert and oriented X 3. Moves all extremities spontaneously. Psych: Responds to questions appropriately with a normal affect.   Intake/Output Summary (Last 24 hours) at 04/01/15 0744 Last data filed at 04/01/15 0441  Gross per 24 hour  Intake      0 ml  Output   2620 ml  Net  -2620 ml    Inpatient Medications:  . aspirin  81 mg Oral Daily  . carvedilol  12.5 mg Oral BID WC  . enoxaparin (LOVENOX) injection  40 mg Subcutaneous Q24H  . furosemide  80 mg Intravenous BID  . isosorbide-hydrALAZINE  1 tablet Oral TID  . lisinopril  40 mg Oral Daily  . potassium chloride  40 mEq Oral BID  . sodium chloride  3 mL Intravenous Q12H   Infusions:  . nitroPRUSSide Stopped (03/30/15 1654)    Labs:  Recent Labs  03/31/15 0328 04/01/15 0426  NA 142 142  K 4.5 4.6  CL 105 102  CO2 31 33*  GLUCOSE 103* 135*  BUN 17 21*  CREATININE 0.88 0.90  CALCIUM 8.7* 9.2     Radiology/Studies:  Dg Chest 2  View  03/28/2015  CLINICAL DATA:  Shortness of breath EXAM: CHEST  2 VIEW COMPARISON:  04/08/2013 FINDINGS: Chronic cardiomegaly and aortic tortuosity. No aortic aneurysm on chest CT 04/18/2013 Pulmonary venous congestion accentuated by low volumes. There is no edema, consolidation, effusion, or pneumothorax. Scoliosis with innumerable compression fractures of the thoracic and lumbar spine. Patient has history of osteogenesis imperfecta. IMPRESSION: 1. Stable exam.  No evidence of acute disease. 2. Chronic cardiomegaly, aortic tortuosity, and venous congestion. Electronically Signed   By: Monte Fantasia M.D.   On: 03/28/2015 10:52     Assessment and Plan  48M with osteogenesis imperfecta, morbid obesity (BMI 45.9), HTN, NICM/chronic systolic CHF EF 0000000 (cath 2015: no sig CAD, + severely elevated LV filling pressures and pulm HTN). Seen by AHF team with cMRI showing EF 38%, global HK, mild LVH, mild-mod RV dysfunction, possible infiltrative disease. Serum ACEI normal and no evidence of sarcoid on CT. Outpatient endocardial bx recommended but never performed. Admitted 03/28/15 with DOE/orthopnea in setting of dietary noncompliance and accelerated HTN. 2D echo 03/29/15: septal, apical, inferior wall HK, mod dilated LV, EF 30-35%, mild MR.  1. 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 BUN and CO2 starting to bump. Will change Lasix to oral form (was on 80mg  IV BID here, 40mg  daily at home - will settle at 40mg  BID). With K steadily rising will drop KCl to 50meq daily. Titrate  carvedilol to 25mg  BID. Would benefit from following back up in advanced HF clinic as in previous years. Needs trial off O2 today.  2. Elevated troponin - likely due to HTN/CHF. Flat trend. Historically normal coronaries. Do not anticipate further ischemic workup as inpatient.   3. Accelerated HTN - BP improving.  4. Hypokalemia - improved. See above.  Signed, Melina Copa  PA-C Pager: [phone]   Patient seen and examined and history reviewed. Agree with above findings and plan. Patient feels well today. Breathing back to baseline and feels he is having a good day. BP controlled. I/O negative 6.7 liters since admit. Lungs clear. Trace edema. Agree with switching lasix to po. Anticipate possible DC in am. Will need follow up in advanced heart failure clinic.  Peter Martinique, [place] 04/01/2015 11:44 AM

## 2015-04-02 LAB — BASIC METABOLIC PANEL
Anion gap: 7 (ref 5–15)
BUN: 21 mg/dL — AB (ref 6–20)
CHLORIDE: 101 mmol/L (ref 101–111)
CO2: 32 mmol/L (ref 22–32)
CREATININE: 0.89 mg/dL (ref 0.61–1.24)
Calcium: 9.2 mg/dL (ref 8.9–10.3)
GFR calc Af Amer: >60 mL/min (ref >60)
GFR calc non Af Amer: >60 mL/min (ref >60)
Glucose, Bld: 114 mg/dL — ABNORMAL HIGH (ref 65–99)
Potassium: 4.4 mmol/L (ref 3.5–5.1)
Sodium: 140 mmol/L (ref 135–145)

## 2015-04-02 LAB — THIOCYANATE, BLOOD: THIOCYANATE: 0.9 mg/dL

## 2015-04-02 MED ORDER — LISINOPRIL 40 MG PO TABS: 40.0000 mg | ORAL_TABLET | Freq: Every day | ORAL | Status: DC

## 2015-04-02 MED ORDER — ISOSORB DINITRATE-HYDRALAZINE 20-37.5 MG PO TABS: 1.0000 | ORAL_TABLET | Freq: Three times a day (TID) | ORAL | Status: DC

## 2015-04-02 MED ORDER — CARVEDILOL 25 MG PO TABS: 25.0000 mg | ORAL_TABLET | Freq: Two times a day (BID) | ORAL | Status: DC

## 2015-04-02 MED ORDER — POTASSIUM CHLORIDE CRYS ER 20 MEQ PO TBCR: 20.0000 meq | EXTENDED_RELEASE_TABLET | Freq: Every day | ORAL | Status: DC

## 2015-04-02 MED ORDER — FUROSEMIDE 40 MG PO TABS: 40.0000 mg | ORAL_TABLET | Freq: Two times a day (BID) | ORAL | Status: DC

## 2015-04-02 NOTE — Care Management Note (Signed)
Case Management Note  Patient Details  Name: Rodolphe Rettig MRN: [id number] Date of Birth: 02/14/1971  Subjective/Objective:                    Action/Plan:d/c home.   Expected Discharge Date:   (unknown)               Expected Discharge Plan:  Home/Self Care  In-House Referral:     Discharge planning Services  CM Consult  Post Acute Care Choice:    Choice offered to:     DME Arranged:    DME Agency:     HH Arranged:    [place] Agency:     Status of Service:  Completed, signed off  Medicare Important Message Given:  Yes Date Medicare IM Given:    Medicare IM give by:    Date Additional Medicare IM Given:    Additional Medicare Important Message give by:     If discussed at [place] of Stay Meetings, dates discussed:    Additional Comments:  Dessa Phi, RN 04/02/2015, 11:53 AM

## 2015-04-02 NOTE — Discharge Summary (Signed)
Physician Discharge Summary  Charlene Murrie [id number] DOB: [birth year] DOA: 03/28/2015  PCP: [name], MD  Admit date: 03/28/2015 Discharge date: 04/02/2015  Time spent: >35 minutes  Recommendations for Outpatient Follow-up:  HF clinic referral  PCP in 1 week as needed. check BMP in 1 week   Discharge Diagnoses:  Principal Problem:   Acute respiratory failure with hypoxemia (HCC) Active Problems:   Acute on chronic combined systolic and diastolic CHF, NYHA class 4 (HCC)   Elevated troponin   Accelerated hypertension   Acute respiratory failure with hypoxia [facility])   Hypertensive emergency   Discharge Condition: stable   Diet recommendation: low sodium   Filed Weights   03/31/15 0330 04/01/15 0445 04/02/15 0656  Weight: 94.5 kg (208 lb 5.4 oz) 96.7 kg (213 lb 3 oz) 96.4 kg (212 lb 8.4 oz)    History of present illness:  45 y/o male with osteogenesis imperfecta, hypertension, nonischemic cardiomyopathy with EF of 20-25%, OSA on CPAP presented to the ED with several days of progressively worsening shortness of breath with exertion followed by dyspnea at rest with orthopnea, cough and chest tightness. In the ED patient found to be in CHF exacerbation with hypertensive urgency and placed on nitroglycerin drip and then switched to IV nitroprusside. He also had mildly elevated troponin. Seen by cardiology and recommend diuresis and continue nitroglycerin drip with step down monitoring.  Hospital Course:  1. Acute hypoxic respiratory failure Secondary to acute on chronic combined systolic and diastolic CHF. Resolved with IV Lasix 2. Acute decompensated congestive heart failure. Pt Has history of nonischemic cardiomyopathy with cardiac cath in 2015 showing no significant coronary artery disease but severely elevated LV filling pressure and pulmonary hypertension. Also had cardiac MRI done in the past showing EF of 30% with global hypokinesis. Recommended for outpatient endocardial  biopsy. - Pt diuresed with IV Lasix 80 mg twice daily. Net -6.6 L since admission. But weight remained the same (? inaccurate) 2-D echo repeated showed septal apical and inferior wall hypokinesis with EF of 30-35%. Cardiology was following. Recommended to transition to po Lasix 40 BID. Continue aspirin and lisinopril. BB. Recheck labs in 1 week. Referred to HF clinic   3. Hypertensive urgency initially maintained on nitroprusside drip, he was able to be weaned off 1/7. Blood pressure overall improved.  - currently on Lasix 40 BID, Bidil 20-37.5 TID, Lisinopril 40 daily. Increase Coreg 4. Elevated troponin Secondary to demand. No chest pains 5. OSA Continue nighttime CPAP   Procedures:  Echo; Study Conclusions  - Left ventricle: Septal apical and inferior wall hypokinesis. The cavity size was moderately dilated. Wall thickness was normal. Systolic function was moderately to severely reduced. The estimated ejection fraction was in the range of 30% to 35%. - Mitral valve: There was mild regurgitation. - Atrial septum: No defect or patent foramen ovale was identified. (i.e. Studies not automatically included, echos, thoracentesis, etc; not x-rays)  Consultations:  Cardiology   Discharge Exam: Filed Vitals:   04/01/15 2143 04/02/15 0656  BP: 134/62 132/79  Pulse: 79 87  Temp: 98.3 F (36.8 C) 98.3 F (36.8 C)  Resp: 20 20    General: alert. No distress  Cardiovascular: s1,s2 rrr Respiratory: no wheezing   Discharge Instructions  Discharge Instructions    AMB Referral to HF Clinic    Complete by:  As directed      AMB referral to CHF clinic    Complete by:  As directed      Diet - low  sodium heart healthy    Complete by:  As directed      Diet - low sodium heart healthy    Complete by:  As directed      Heart Failure patients record your daily weight using the same scale at the same time of day    Complete by:  As directed      Increase activity slowly     Complete by:  As directed      Increase activity slowly    Complete by:  As directed             Medication List    STOP taking these medications        MUCINEX COLD CHILDRENS PO      TAKE these medications        aspirin EC 81 MG tablet  Take 1 tablet (81 mg total) by mouth daily.     carvedilol 25 MG tablet  Commonly known as:  COREG  Take 1 tablet (25 mg total) by mouth 2 (two) times daily with a meal.     furosemide 40 MG tablet  Commonly known as:  LASIX  Take 1 tablet (40 mg total) by mouth 2 (two) times daily.     isosorbide-hydrALAZINE 20-37.5 MG tablet  Commonly known as:  BIDIL  Take 1 tablet by mouth 3 (three) times daily.     lisinopril 40 MG tablet  Commonly known as:  PRINIVIL,ZESTRIL  Take 1 tablet (40 mg total) by mouth daily.     potassium chloride SA 20 MEQ tablet  Commonly known as:  K-DUR,KLOR-CON  Take 1 tablet (20 mEq total) by mouth daily.       Allergies  Allergen Reactions  . Fish Allergy Anaphylaxis  . Peanuts [Peanut Oil] Anaphylaxis  . Shellfish Allergy Anaphylaxis  . Latex Itching       Follow-up Information    Follow up with BURNETT,BRENT A, MD. Schedule an appointment as soon as possible for a visit in 1 week.   Specialty:  Family Medicine   Contact information:   [id number] [place] [address] [phone]        The results of significant diagnostics from this hospitalization (including imaging, microbiology, ancillary and laboratory) are listed below for reference.    Significant Diagnostic Studies: Dg Chest 2 View  03/28/2015  CLINICAL DATA:  Shortness of breath EXAM: CHEST  2 VIEW COMPARISON:  04/08/2013 FINDINGS: Chronic cardiomegaly and aortic tortuosity. No aortic aneurysm on chest CT 04/18/2013 Pulmonary venous congestion accentuated by low volumes. There is no edema, consolidation, effusion, or pneumothorax. Scoliosis with innumerable compression fractures of the thoracic and lumbar spine.  Patient has history of osteogenesis imperfecta. IMPRESSION: 1. Stable exam.  No evidence of acute disease. 2. Chronic cardiomegaly, aortic tortuosity, and venous congestion. Electronically Signed   By: Monte Fantasia M.D.   On: 03/28/2015 10:52    Microbiology: Recent Results (from the past 240 hour(s))  MRSA PCR Screening     Status: None   Collection Time: 03/29/15  1:18 PM  Result Value Ref Range Status   MRSA by PCR NEGATIVE NEGATIVE Final    Comment:        The GeneXpert MRSA Assay (FDA approved for NASAL specimens only), is one component of a comprehensive MRSA colonization surveillance program. It is not intended to diagnose MRSA infection nor to guide or monitor treatment for MRSA infections.      Labs: Basic Metabolic Panel:  Recent Labs Lab 03/28/15 1848 03/29/15 0525 03/30/15 0330 03/31/15 0328 04/01/15 0426 04/02/15 0413  NA 145 142 143 142 142 140  K 3.6 4.0 3.3* 4.5 4.6 4.4  CL 103 103 102 105 102 101  CO2 31 30 31 31  33* 32  GLUCOSE 102* 104* 107* 103* 135* 114*  BUN 15 13 16 17  21* 21*  CREATININE 1.17 1.00 0.81 0.88 0.90 0.89  CALCIUM 8.9 8.3* 8.1* 8.7* 9.2 9.2  MG 1.5*  --   --   --   --   --    Liver Function Tests:  Recent Labs Lab 03/28/15 1848  AST 39  ALT 33  ALKPHOS 66  BILITOT 1.3*  PROT 7.4  ALBUMIN 3.4*   No results for input(s): LIPASE, AMYLASE in the last 168 hours. No results for input(s): AMMONIA in the last 168 hours. CBC:  Recent Labs Lab 03/28/15 1222 03/28/15 1848 03/29/15 0525  WBC 9.5 8.8 9.4  HGB 16.0 14.6 14.1  HCT 53.0* 46.4 46.4  MCV 95.0 93.2 96.5  PLT 218 180 185   Cardiac Enzymes:  Recent Labs Lab 03/28/15 1222 03/28/15 1848 03/29/15 0121 03/29/15 0528  TROPONINI 0.12* 0.12* 0.11* 0.09*   BNP: BNP (last 3 results)  Recent Labs  03/28/15 1221 03/28/15 1848  BNP 788.2* 740.7*    ProBNP (last 3 results) No results for input(s): PROBNP in the last 8760 hours.  CBG: No results for  input(s): GLUCAP in the last 168 hours.     SignedKinnie Feil  [facility] 04/02/2015, 9:55 AM

## 2015-04-02 NOTE — Progress Notes (Signed)
RT placed patient on CPAP. Patient setting is 12-20 cmH2O. 6 liters oxygen bleed into tubing. Sterile water added to water chamber for humidification. Patient is tolerating well. RT will continue to monitor.

## 2015-04-02 NOTE — Care Management Important Message (Signed)
Important Message  Patient Details  Name: Jerry Edwards MRN: [id number] Date of Birth: 06-17-1970   Medicare Important Message Given:  Yes    Camillo Flaming 04/02/2015, 10:34 AMImportant Message  Patient Details  Name: Jerry Edwards MRN: [id number] Date of Birth: April 23, 1970   Medicare Important Message Given:  Yes    Camillo Flaming 04/02/2015, 10:34 AM

## 2015-04-23 ENCOUNTER — Inpatient Hospital Stay (HOSPITAL_COMMUNITY): Payer: Medicare Other | Admitting: Internal Medicine

## 2015-05-01 ENCOUNTER — Ambulatory Visit (INDEPENDENT_AMBULATORY_CARE_PROVIDER_SITE_OTHER): Payer: Medicare Other | Admitting: Cardiovascular Disease

## 2015-05-01 ENCOUNTER — Encounter: Payer: Self-pay | Admitting: Cardiovascular Disease

## 2015-05-01 VITALS — BP 190/122 | HR 108 | Ht <= 58 in | Wt 214.4 lb

## 2015-05-01 DIAGNOSIS — I110 Hypertensive heart disease with heart failure: Secondary | ICD-10-CM

## 2015-05-01 DIAGNOSIS — I10 Essential (primary) hypertension: Secondary | ICD-10-CM

## 2015-05-01 DIAGNOSIS — I5043 Acute on chronic combined systolic (congestive) and diastolic (congestive) heart failure: Secondary | ICD-10-CM

## 2015-05-01 DIAGNOSIS — I119 Hypertensive heart disease without heart failure: Secondary | ICD-10-CM | POA: Insufficient documentation

## 2015-05-01 DIAGNOSIS — I5042 Chronic combined systolic (congestive) and diastolic (congestive) heart failure: Secondary | ICD-10-CM

## 2015-05-01 MED ORDER — HYDRALAZINE HCL 25 MG PO TABS: 25.0000 mg | ORAL_TABLET | Freq: Three times a day (TID) | ORAL | Status: DC

## 2015-05-01 MED ORDER — ISOSORBIDE DINITRATE 20 MG PO TABS: 20.0000 mg | ORAL_TABLET | Freq: Two times a day (BID) | ORAL | Status: DC

## 2015-05-01 NOTE — Patient Instructions (Addendum)
Medication Instructions:  STOP Bidil START Hydralazine 25 mg three times per day START Isosorbide dinitrate 20 mg twice daily  Labwork: None Ordered   Testing/Procedures:   Follow-Up: You have been referred to Heart Failure Clinic   If you need a refill on your cardiac medications before your next appointment, please call your pharmacy.   Thank you for choosing [facility]! Christen Bame, RN [phone]
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
## 2015-05-02 ENCOUNTER — Telehealth: Payer: Self-pay | Admitting: Cardiovascular Disease

## 2015-05-02 MED ORDER — HYDRALAZINE HCL 25 MG PO TABS: 25.0000 mg | ORAL_TABLET | Freq: Three times a day (TID) | ORAL | Status: DC

## 2015-05-02 NOTE — Telephone Encounter (Signed)
New message   Pt states that he was in office 05-01-15 and that prescriptions was called in but he is missing one   Please call opt

## 2015-05-02 NOTE — Telephone Encounter (Signed)
Spoke with patient who states [facility] did not receive Rx for hydralazine.  I advised him that I have reordered and to call back with questions or concerns.  He thanked me for my help.

## 2015-05-10 ENCOUNTER — Other Ambulatory Visit: Payer: Self-pay | Admitting: *Deleted

## 2015-05-10 MED ORDER — FUROSEMIDE 40 MG PO TABS: 40.0000 mg | ORAL_TABLET | Freq: Two times a day (BID) | ORAL | Status: DC

## 2015-05-31 ENCOUNTER — Encounter (HOSPITAL_COMMUNITY): Payer: Self-pay | Admitting: Internal Medicine

## 2015-05-31 ENCOUNTER — Ambulatory Visit (HOSPITAL_COMMUNITY)
Admission: RE | Admit: 2015-05-31 | Discharge: 2015-05-31 | Disposition: A | Discharge status: Home / Self Care | Payer: Medicare Other | Source: Ambulatory Visit | Attending: Internal Medicine | Admitting: Internal Medicine

## 2015-05-31 VITALS — BP 164/86 | HR 111 | Wt 216.2 lb

## 2015-05-31 DIAGNOSIS — Z8249 Family history of ischemic heart disease and other diseases of the circulatory system: Secondary | ICD-10-CM | POA: Diagnosis not present

## 2015-05-31 DIAGNOSIS — Z9981 Dependence on supplemental oxygen: Secondary | ICD-10-CM | POA: Diagnosis not present

## 2015-05-31 DIAGNOSIS — I272 Other secondary pulmonary hypertension: Secondary | ICD-10-CM | POA: Diagnosis not present

## 2015-05-31 DIAGNOSIS — J9601 Acute respiratory failure with hypoxia: Secondary | ICD-10-CM

## 2015-05-31 DIAGNOSIS — I509 Heart failure, unspecified: Secondary | ICD-10-CM | POA: Diagnosis present

## 2015-05-31 DIAGNOSIS — N189 Chronic kidney disease, unspecified: Secondary | ICD-10-CM | POA: Insufficient documentation

## 2015-05-31 DIAGNOSIS — E669 Obesity, unspecified: Secondary | ICD-10-CM | POA: Insufficient documentation

## 2015-05-31 DIAGNOSIS — I5022 Chronic systolic (congestive) heart failure: Secondary | ICD-10-CM | POA: Insufficient documentation

## 2015-05-31 DIAGNOSIS — Q780 Osteogenesis imperfecta: Secondary | ICD-10-CM | POA: Diagnosis not present

## 2015-05-31 DIAGNOSIS — J9610 Chronic respiratory failure, unspecified whether with hypoxia or hypercapnia: Secondary | ICD-10-CM | POA: Insufficient documentation

## 2015-05-31 DIAGNOSIS — G4733 Obstructive sleep apnea (adult) (pediatric): Secondary | ICD-10-CM | POA: Diagnosis not present

## 2015-05-31 DIAGNOSIS — I428 Other cardiomyopathies: Secondary | ICD-10-CM | POA: Insufficient documentation

## 2015-05-31 DIAGNOSIS — I110 Hypertensive heart disease with heart failure: Secondary | ICD-10-CM

## 2015-05-31 DIAGNOSIS — I130 Hypertensive heart and chronic kidney disease with heart failure and stage 1 through stage 4 chronic kidney disease, or unspecified chronic kidney disease: Secondary | ICD-10-CM | POA: Insufficient documentation

## 2015-05-31 DIAGNOSIS — Z7982 Long term (current) use of aspirin: Secondary | ICD-10-CM | POA: Diagnosis not present

## 2015-05-31 DIAGNOSIS — Z993 Dependence on wheelchair: Secondary | ICD-10-CM | POA: Diagnosis not present

## 2015-05-31 DIAGNOSIS — Z79899 Other long term (current) drug therapy: Secondary | ICD-10-CM | POA: Diagnosis not present

## 2015-05-31 MED ORDER — POTASSIUM CHLORIDE CRYS ER 20 MEQ PO TBCR: 40.0000 meq | EXTENDED_RELEASE_TABLET | Freq: Every day | ORAL | Status: DC

## 2015-05-31 MED ORDER — HYDRALAZINE HCL 50 MG PO TABS: 50.0000 mg | ORAL_TABLET | Freq: Three times a day (TID) | ORAL | Status: DC

## 2015-05-31 MED ORDER — METOLAZONE 2.5 MG PO TABS: 2.5000 mg | ORAL_TABLET | ORAL | Status: DC

## 2015-05-31 MED ORDER — DIGOXIN 125 MCG PO TABS: 0.1250 mg | ORAL_TABLET | Freq: Every day | ORAL | Status: DC

## 2015-05-31 NOTE — Progress Notes (Signed)
ADVANCED HF CLINIC   Patient ID: Jerry Edwards, male   DOB: 10-May-1970, 45 y.o.   MRN: [id number]   Primary Physician: [name], MD  Primary Cardiologist: [name]  HPI: Mr Toews is a 45 year old with history of NICM, Chronic systolic heart failure, HTN, OSA, and osteogenic imperfecta.   Admitted to [facility] 1/17 through 04/21/13 with increased dyspnea and mild volume overload. Question regarding possible infiltrative disease. ECHO 04/08/2013. EF 25%. cMRI 12/15 EF 38% possible infiltrative disease with discrete areas of non-coronary pattern delayed enhancement. Coronaries were normal on LHC.  SPEP no M spike and UPEP no free light chains. HIV negative and TSH nl.CT of chest no evidence of pulmonary infiltrates. D/C weight 193 pounds.   He was admitted in January 2017 with recurrent HF, severe HTN and hypoxemia. Diuresed and discharged home. Saw Dr. Acie Fredrickson a few weeks and was not taking Bidl because he couldn't afford. So given scripts for hydralazine and Imdur and now can afford. Also started on home O2 for sats in 70s. Says he sleeps with it and uses when he feels short of breath. Has good days and bad days. Doesn't weigh himself because he doesn't have a scale but weight stable when he weighs himself at his mom's house. + edema. More SOB when he is wheeling his WC. Has been increasing lasix to 80/40 since he got out of the hospital.   Echo 1/17 30-35% RV ok    LHC/RHC 04/08/13  Normal coronaries RA pressure:  39/39 with a mean of 34. Prominent X. and Y. Descends.  RV pressure: 76/26  mean 4 PA pressure: 70/36 with a mean of 51  Pulmonary capillary wedge pressure: 31/30 and mean of 29  LV pressure: 140/26 with a left ventricular end-diastolic pressure of 35  PA sat 66%  AO sat 97%  CO/CI 4.94/2.63  Pulmonary vascular resistance: 4.45 Woods units   Labs (1/15): K 4.6, creatinine 1.25, TSH normal, UPEP negative, SPEP negative, transferrin saturation 39%, HIV negative, ACE level not  elevated.  Labs 1/17: K 4.4 creatinine 0.89  ROS: All systems negative except as listed in HPI, PMH and Problem List.  PMH: 1. HTN 2. Osteogenesis imperfecta: wheelchair-bound.  History of bone fractures.  3. Suspect OSA 4. CKD 5. Nonischemic cardiomyopathy: Echo (1/15) with EF 20-25%, severe diffuse hypokinesis, mild MR, mildly enlarged RV with moderately decreased systolic function.  LHC/RHC (1/15) with normal coronaries, mean RA 34, PA 70/36 mean 51, mean PCWP 29, CI 2.63.  Cardiac MRI (1/15) with EF 38%, global hypokinesis, discrete areas of delayed enhancement in the subepicardial mid inferoseptal RV insertion site, the mid wall of the mid inferolateral wall, and the subendocardial mid anterolateral wall.  This pattern was suggestive of infiltrative disease.  UPEP/SPEP negative, HIV negative.  Chest CT to look for evidence of sarcoidosis did not show any sarcoid-type lesions.    SH:  Single, prior smoking now quit, occasional marijuana, works as Dispensing optician.   FH:  HTN, no significant cardiac disease.   Current Outpatient Prescriptions  Medication Sig Dispense Refill  . aspirin 81 MG tablet Take 1 tablet (81 mg total) by mouth daily.    . carvedilol (COREG) 25 MG tablet Take 1 tablet (25 mg total) by mouth 2 (two) times daily with a meal. 60 tablet 1  . furosemide (LASIX) 40 MG tablet Take 1 tablet (40 mg total) by mouth 2 (two) times daily. 180 tablet 3  . hydrALAZINE (APRESOLINE) 25 MG tablet Take 1 tablet (  25 mg total) by mouth 3 (three) times daily. 90 tablet 11  . isosorbide dinitrate (ISORDIL) 20 MG tablet Take 1 tablet (20 mg total) by mouth 2 (two) times daily. 60 tablet 11  . lisinopril (PRINIVIL,ZESTRIL) 40 MG tablet Take 1 tablet (40 mg total) by mouth daily. 30 tablet 1  . potassium chloride SA (K-DUR,KLOR-CON) 20 MEQ tablet Take 40 mEq by mouth daily.     No current facility-administered medications for this encounter.    PHYSICAL EXAM: Filed Vitals:   05/31/15 1158  BP:  164/86  Pulse: 111  Weight: 216 lb 4 oz (98.09 kg)  SpO2: 95%   General:  Well appearing. No resp difficulty Arrived in the clinic in a wheelchair Mom Present  HEENT: normal Neck: supple. JVP unable to see. Carotids 2+ bilaterally; no bruits. No lymphadenopathy or thryomegaly appreciated. Cor: PMI normal. Tachy regular. No rubs, gallops or murmurs. Lungs: clear Abdomen: soft, nontender, nondistended. No hepatosplenomegaly. No bruits or masses. Good bowel sounds. Extremities: no cyanosis, clubbing, rash. Deformed due to osteogenesis imperfecta. 2+ edema Neuro: alert & orientedx3, cranial nerves grossly intact. Moves all 4 extremities w/o difficulty. Affect pleasant.   ASSESSMENT & PLAN: 1. Chronic Systolic Heart Failure. NICM EF 25% with no coronary disease on LHC in 1/15. 03/2013 cMRI possible infiltrative disease. SPEP and UPEP ok. EF 30-35% echo 1/17. Based on the cMRI pattern and concern for cardiac sarcoidosis, a chest CT was done to look for evidence of pulmonary sarcoidosis.  This was not indicative of pulmonary sarcoidosis.  I still think it is possible that this is cardiac sarcoidosis as rarely, sarcoidosis can present only in the heart.  Patient has had no significant arrhythmias detected.  QRS on ECG is not widened.  - NYHA III symptoms.  Markedly volume overloaded.  - Continue lasix 80/40. Add metolazone 2.5 with KCL 20 on Monday and Friday - On goal dose Coreg 25 mg bid.  He is tachycardic today and will check ECG. Start digoxin 0.125. May be candidate for ivabradine but cost an issue - Continue lisinopril 40mg  daily - ideally would change to [facility] but he cannot afford will look into [facility] - Increase hydralazine to 50 tid and continue isordil - Reinforced daily weights, low salt food choices, limiting fluid intake to < 2 liters, and medication compliance. Provided with scale and weight chart today.   - Repeat ECHO in 3 months after HF meds optimized, may need ICD.  2.  Osteogenesis imperfecta - wheel chair bound 3. HTN - BP elevated. Increasing hydralazine 4. OSA - not using CPAP. Will refer to pulmonary for OSA and chronic respiratory failure 5. Obesity 6. Pulmonary hypertension- This appears to be primarily pulmonary venous hypertension on the recent RHC.  There may be a component of PAH related to OSA.   7. Chronic respiratory failure - He is on O2. Have recommended pulse ox and use O2 as needed to keep sats > 90%  Total time spent 40 minutes. Over half that time spent discussing above.    Glori Bickers MD 05/31/2015

## 2015-05-31 NOTE — Progress Notes (Signed)
Advanced Heart Failure Medication Review by a Pharmacist  Does the patient  feel that his/her medications are working for him/her?  yes  Has the patient been experiencing any side effects to the medications prescribed?  Yes.  States one medication makes his hands cold. Furosemide makes him thirsty. Pt states that ice chips help.   Does the patient measure his/her own blood pressure or blood glucose at home?  no   Does the patient have any problems obtaining medications due to transportation or finances?   no  Understanding of regimen: good Understanding of indications: good Potential of compliance: good Patient understands to avoid NSAIDs. Patient understands to avoid decongestants.  Issues to address at subsequent visits: None   Pharmacist comments: 45 YO pleasant male presents to clinic with his family member.  Pt reports one medication makes his hands cold but he doesn't know which one.  Pt denies checking his BP at home. Furosemide makes him thirsty but states that ice chips help with the dry mouth. Pt reports no barriers to obtaining his medications.     Time with patient: 10 min  Preparation and documentation time: 5 min  Total time: 15 min

## 2015-05-31 NOTE — Patient Instructions (Signed)
INCREASE Hydralazine to 50 mg three times daily.  START Metolazone 2.5 mg tablet on Mondays and Fridays. Take extra 20 meq tablet potassium with this.  START Digoxin 0.125 mg tablet once daily.  Will refer you to [place] at [facility]. [address], [place] Phone: [phone]  Follow up 2 weeks with lab work.  Do the following things EVERYDAY: 1) Weigh yourself in the morning before breakfast. Write it down and keep it in a log. 2) Take your medicines as prescribed 3) Eat low salt foods-Limit salt (sodium) to 2000 mg per day.  4) Stay as active as you can everyday 5) Limit all fluids for the day to less than 2 liters

## 2015-06-13 ENCOUNTER — Ambulatory Visit: Payer: Medicare Other | Admitting: Cardiovascular Disease

## 2015-06-18 ENCOUNTER — Ambulatory Visit (HOSPITAL_COMMUNITY)
Admission: RE | Admit: 2015-06-18 | Discharge: 2015-06-18 | Disposition: A | Discharge status: Home / Self Care | Payer: Medicare Other | Source: Ambulatory Visit | Attending: Internal Medicine | Admitting: Internal Medicine

## 2015-06-18 ENCOUNTER — Encounter (HOSPITAL_COMMUNITY): Payer: Self-pay | Admitting: Internal Medicine

## 2015-06-18 VITALS — BP 114/62 | HR 59 | Wt 191.1 lb

## 2015-06-18 DIAGNOSIS — Z7982 Long term (current) use of aspirin: Secondary | ICD-10-CM | POA: Insufficient documentation

## 2015-06-18 DIAGNOSIS — Z8249 Family history of ischemic heart disease and other diseases of the circulatory system: Secondary | ICD-10-CM | POA: Diagnosis not present

## 2015-06-18 DIAGNOSIS — Q780 Osteogenesis imperfecta: Secondary | ICD-10-CM | POA: Diagnosis not present

## 2015-06-18 DIAGNOSIS — I272 Other secondary pulmonary hypertension: Secondary | ICD-10-CM | POA: Diagnosis not present

## 2015-06-18 DIAGNOSIS — Z79899 Other long term (current) drug therapy: Secondary | ICD-10-CM | POA: Diagnosis not present

## 2015-06-18 DIAGNOSIS — I428 Other cardiomyopathies: Secondary | ICD-10-CM | POA: Insufficient documentation

## 2015-06-18 DIAGNOSIS — I110 Hypertensive heart disease with heart failure: Secondary | ICD-10-CM | POA: Diagnosis not present

## 2015-06-18 DIAGNOSIS — J9610 Chronic respiratory failure, unspecified whether with hypoxia or hypercapnia: Secondary | ICD-10-CM | POA: Insufficient documentation

## 2015-06-18 DIAGNOSIS — E669 Obesity, unspecified: Secondary | ICD-10-CM | POA: Insufficient documentation

## 2015-06-18 DIAGNOSIS — Z9981 Dependence on supplemental oxygen: Secondary | ICD-10-CM | POA: Diagnosis not present

## 2015-06-18 DIAGNOSIS — G4733 Obstructive sleep apnea (adult) (pediatric): Secondary | ICD-10-CM | POA: Insufficient documentation

## 2015-06-18 DIAGNOSIS — I5022 Chronic systolic (congestive) heart failure: Secondary | ICD-10-CM | POA: Diagnosis not present

## 2015-06-18 DIAGNOSIS — Z993 Dependence on wheelchair: Secondary | ICD-10-CM | POA: Insufficient documentation

## 2015-06-18 LAB — DIGOXIN LEVEL: DIGOXIN LVL: 0.8 ng/mL (ref 0.8–2.0)

## 2015-06-18 LAB — BASIC METABOLIC PANEL
Anion gap: 11 (ref 5–15)
BUN: 34 mg/dL — AB (ref 6–20)
CALCIUM: 9.5 mg/dL (ref 8.9–10.3)
CHLORIDE: 88 mmol/L — AB (ref 101–111)
CO2: 41 mmol/L — ABNORMAL HIGH (ref 22–32)
CREATININE: 1.57 mg/dL — AB (ref 0.61–1.24)
GFR, EST AFRICAN AMERICAN: 60 mL/min — AB (ref >60)
GFR, EST NON AFRICAN AMERICAN: 52 mL/min — AB (ref >60)
Glucose, Bld: 110 mg/dL — ABNORMAL HIGH (ref 65–99)
Potassium: 3.2 mmol/L — ABNORMAL LOW (ref 3.5–5.1)
SODIUM: 140 mmol/L (ref 135–145)

## 2015-06-18 MED ORDER — METOLAZONE 2.5 MG PO TABS: 2.5000 mg | ORAL_TABLET | ORAL | Status: DC | PRN

## 2015-06-18 MED ORDER — SACUBITRIL-VALSARTAN 49-51 MG PO TABS: 1.0000 | ORAL_TABLET | Freq: Two times a day (BID) | ORAL | Status: DC

## 2015-06-18 MED ORDER — METOLAZONE 2.5 MG PO TABS: ORAL_TABLET | ORAL | Status: DC

## 2015-06-18 NOTE — Addendum Note (Signed)
Encounter addended by: Harvie Junior, CMA on: 06/18/2015 10:28 AM<BR>     Documentation filed: Dx [employer], Patient Instructions Section, Orders

## 2015-06-18 NOTE — Progress Notes (Signed)
Patient ID: Jerry Edwards, male   DOB: 10/31/1970, 45 y.o.   MRN: [id number]   ADVANCED HF CLINIC     Primary Physician: [name], MD  Primary Cardiologist: [name]  HPI: Jerry Edwards is a 45 year old with history of NICM, Chronic systolic heart failure, HTN, OSA, and osteogenic imperfecta.   Admitted to [facility] 1/17 through 04/21/13 with increased dyspnea and mild volume overload. Question regarding possible infiltrative disease. ECHO 04/08/2013. EF 25%. cMRI 12/15 EF 38% possible infiltrative disease with discrete areas of non-coronary pattern delayed enhancement. Coronaries were normal on LHC.  SPEP no M spike and UPEP no free light chains. HIV negative and TSH nl.CT of chest no evidence of pulmonary infiltrates. D/C weight 193 pounds.   He was admitted in January 2017 with recurrent HF, severe HTN and hypoxemia. Diuresed and discharged home. Saw Dr. Acie Fredrickson a few weeks and was not taking Bidl because he couldn't afford. So given scripts for hydralazine and Imdur and now can afford. Also started on home O2 for sats in 70s.   Two weeks ago added metolazone 2.5 twice a week and also increased hydralazine and imdur. Weight now down 24 pounds. Breathing better. Less edema. Feels he is back to his regular size. Digoxin and metolazone very expensive   Echo 1/17 30-35% RV ok    LHC/RHC 04/08/13  Normal coronaries RA pressure:  39/39 with a mean of 34. Prominent X. and Y. Descends.  RV pressure: 76/26  mean 4 PA pressure: 70/36 with a mean of 51  Pulmonary capillary wedge pressure: 31/30 and mean of 29  LV pressure: 140/26 with a left ventricular end-diastolic pressure of 35  PA sat 66%  AO sat 97%  CO/CI 4.94/2.63  Pulmonary vascular resistance: 4.45 Woods units   Labs (1/15): K 4.6, creatinine 1.25, TSH normal, UPEP negative, SPEP negative, transferrin saturation 39%, HIV negative, ACE level not elevated.  Labs 1/17: K 4.4 creatinine 0.89  ROS: All systems negative except as listed in HPI,  PMH and Problem List.  PMH: 1. HTN 2. Osteogenesis imperfecta: wheelchair-bound.  History of bone fractures.  3. Suspect OSA 4. CKD 5. Nonischemic cardiomyopathy: Echo (1/15) with EF 20-25%, severe diffuse hypokinesis, mild Jerry, mildly enlarged RV with moderately decreased systolic function.  LHC/RHC (1/15) with normal coronaries, mean RA 34, PA 70/36 mean 51, mean PCWP 29, CI 2.63.  Cardiac MRI (1/15) with EF 38%, global hypokinesis, discrete areas of delayed enhancement in the subepicardial mid inferoseptal RV insertion site, the mid wall of the mid inferolateral wall, and the subendocardial mid anterolateral wall.  This pattern was suggestive of infiltrative disease.  UPEP/SPEP negative, HIV negative.  Chest CT to look for evidence of sarcoidosis did not show any sarcoid-type lesions.    SH:  Single, prior smoking now quit, occasional marijuana, works as Dispensing optician.   FH:  HTN, no significant cardiac disease.   Current Outpatient Prescriptions  Medication Sig Dispense Refill  . aspirin 81 MG tablet Take 1 tablet (81 mg total) by mouth daily.    . carvedilol (COREG) 25 MG tablet Take 1 tablet (25 mg total) by mouth 2 (two) times daily with a meal. 60 tablet 1  . digoxin (LANOXIN) 0.125 MG tablet Take 1 tablet (0.125 mg total) by mouth daily. 90 tablet 3  . furosemide (LASIX) 40 MG tablet Take 40 mg by mouth. 80 mg in am (2 tabs) and 40 mg in pm (1 tab)    . hydrALAZINE (APRESOLINE) 50 MG tablet Take  50 mg by mouth 2 (two) times daily.    . isosorbide dinitrate (ISORDIL) 20 MG tablet Take 1 tablet (20 mg total) by mouth 2 (two) times daily. 60 tablet 11  . lisinopril (PRINIVIL,ZESTRIL) 40 MG tablet Take 1 tablet (40 mg total) by mouth daily. 30 tablet 1  . metolazone (ZAROXOLYN) 2.5 MG tablet Take 1 tablet (2.5 mg total) by mouth 2 (two) times a week. MONDAYS AND FRIDAYS 30 tablet 3  . potassium chloride SA (K-DUR,KLOR-CON) 20 MEQ tablet Take 2 tablets (40 mEq total) by mouth daily. Take extra 20  meq tablet on Mondays and Fridays 70 tablet 3   No current facility-administered medications for this encounter.    PHYSICAL EXAM: Filed Vitals:   06/18/15 1000  BP: 114/62  Pulse: 59  Weight: 191 lb 1.9 oz (86.691 kg)  SpO2: 99%   General:  Well appearing. No resp difficulty Arrived in the clinic in a wheelchair Mom Present  HEENT: normal Neck: supple. JVP down Carotids 2+ bilaterally; no bruits. No lymphadenopathy or thryomegaly appreciated. Cor: PMI normal. Regular. No rubs, gallops or murmurs. Lungs: clear Abdomen: soft, nontender, nondistended. No hepatosplenomegaly. No bruits or masses. Good bowel sounds. Extremities: no cyanosis, clubbing, rash. Deformed due to osteogenesis imperfecta. no edema Neuro: alert & orientedx3, cranial nerves grossly intact. Moves all 4 extremities w/o difficulty. Affect pleasant.   ASSESSMENT & PLAN: 1. Chronic Systolic Heart Failure. NICM EF 25% with no coronary disease on LHC in 1/15. 03/2013 cMRI possible infiltrative disease. SPEP and UPEP ok. EF 30-35% echo 1/17. Based on the cMRI pattern and concern for cardiac sarcoidosis, a chest CT was done to look for evidence of pulmonary sarcoidosis.  This was not indicative of pulmonary sarcoidosis.  I still think it is possible that this is cardiac sarcoidosis as rarely, sarcoidosis can present only in the heart.  Patient has had no significant arrhythmias detected.  QRS on ECG is not widened.  - NYHA II symptoms.  Fluid status much improved. Now back to baseline.  - Continue lasix 80/40. Can use metolazone 2.5 with KCL 20 as needed to keep weight 190-195 - On goal dose Coreg 25 mg bid.  HR much improved. Will check dig level and labs today.  - Continue lisinopril 40mg  daily - ideally would change to [facility] but he cannot afford will look into [facility] - Continue hydralazine to 50 tid and continue isordil - Reinforced daily weights, low salt food choices, limiting fluid intake to < 2 liters, and  medication compliance. Provided with scale and weight chart today.   - Repeat ECHO in 3 months after HF meds optimized, may need ICD.  2. Osteogenesis imperfecta - wheelchair bound 3. HTN - BP much improved. Continue current regimen.  4. OSA - not using CPAP. Will refer to pulmonary for OSA and chronic respiratory failure 5. Obesity 6. Pulmonary hypertension- This appears to be primarily pulmonary venous hypertension on the recent RHC.  There may be a component of PAH related to OSA.   7. Chronic respiratory failure - He is on O2. He got a pulse oximeter. Sats remain in mid 90s. Use O2 as needed.   Glori Bickers MD 06/18/2015

## 2015-06-18 NOTE — Addendum Note (Signed)
Encounter addended by: Harvie Junior, CMA on: 06/18/2015 10:39 AM<BR>     Documentation filed: Patient Instructions Section, Medications, Orders

## 2015-06-18 NOTE — Patient Instructions (Addendum)
Stop Lisinopril.  In 36 hours start Entresto 49/51mg  twice daily.  (take 1st dose Thursday morning)  Only take Metolazone for weight of 190lbs or greater.  Routine lab work today. Will notify you of abnormal results  Follow up with Dr.Bensimhon in 6 weeks

## 2015-06-25 ENCOUNTER — Institutional Professional Consult (permissible substitution): Payer: Medicare Other | Admitting: Pulmonary Disease

## 2015-06-25 ENCOUNTER — Other Ambulatory Visit (HOSPITAL_COMMUNITY): Payer: Self-pay | Admitting: *Deleted

## 2015-07-01 ENCOUNTER — Ambulatory Visit (HOSPITAL_COMMUNITY)
Admission: RE | Admit: 2015-07-01 | Discharge: 2015-07-01 | Disposition: A | Discharge status: Home / Self Care | Payer: Medicare Other | Source: Ambulatory Visit | Attending: Internal Medicine | Admitting: Internal Medicine

## 2015-07-01 DIAGNOSIS — I5022 Chronic systolic (congestive) heart failure: Secondary | ICD-10-CM | POA: Diagnosis not present

## 2015-07-01 LAB — BASIC METABOLIC PANEL
Anion gap: 8 (ref 5–15)
BUN: 13 mg/dL (ref 6–20)
CHLORIDE: 101 mmol/L (ref 101–111)
CO2: 34 mmol/L — ABNORMAL HIGH (ref 22–32)
Calcium: 8.9 mg/dL (ref 8.9–10.3)
Creatinine, Ser: 1.01 mg/dL (ref 0.61–1.24)
GFR calc Af Amer: >60 mL/min (ref >60)
GLUCOSE: 120 mg/dL — AB (ref 65–99)
Potassium: 4 mmol/L (ref 3.5–5.1)
SODIUM: 143 mmol/L (ref 135–145)

## 2015-07-28 ENCOUNTER — Emergency Department (HOSPITAL_COMMUNITY)
Admission: EM | Admit: 2015-07-28 | Discharge: 2015-07-28 | Disposition: A | Discharge status: Home / Self Care | Payer: Medicare Other | Attending: Emergency Medicine | Admitting: Emergency Medicine

## 2015-07-28 ENCOUNTER — Encounter (HOSPITAL_COMMUNITY): Payer: Self-pay

## 2015-07-28 DIAGNOSIS — I110 Hypertensive heart disease with heart failure: Secondary | ICD-10-CM | POA: Insufficient documentation

## 2015-07-28 DIAGNOSIS — Z87891 Personal history of nicotine dependence: Secondary | ICD-10-CM | POA: Insufficient documentation

## 2015-07-28 DIAGNOSIS — Z7982 Long term (current) use of aspirin: Secondary | ICD-10-CM | POA: Insufficient documentation

## 2015-07-28 DIAGNOSIS — H109 Unspecified conjunctivitis: Secondary | ICD-10-CM | POA: Insufficient documentation

## 2015-07-28 DIAGNOSIS — Z79899 Other long term (current) drug therapy: Secondary | ICD-10-CM | POA: Insufficient documentation

## 2015-07-28 DIAGNOSIS — H5712 Ocular pain, left eye: Secondary | ICD-10-CM | POA: Diagnosis present

## 2015-07-28 DIAGNOSIS — I509 Heart failure, unspecified: Secondary | ICD-10-CM | POA: Insufficient documentation

## 2015-07-28 MED ORDER — FLUORESCEIN SODIUM 1 MG OP STRP
1.0000 | ORAL_STRIP | Freq: Once | OPHTHALMIC | Status: AC
  Administered 2015-07-28: 1 via OPHTHALMIC
  Filled 2015-07-28: qty 1

## 2015-07-28 MED ORDER — ERYTHROMYCIN 5 MG/GM OP OINT: TOPICAL_OINTMENT | OPHTHALMIC | Status: DC

## 2015-07-28 MED ORDER — TETRACAINE HCL 0.5 % OP SOLN
2.0000 [drp] | Freq: Once | OPHTHALMIC | Status: AC
  Administered 2015-07-28: 2 [drp] via OPHTHALMIC
  Filled 2015-07-28: qty 4
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
## 2015-07-28 NOTE — Discharge Instructions (Signed)

## 2015-07-28 NOTE — ED Notes (Signed)
Patient states that had something blow into his eye 2 days ago.  Patient states that eye pain has become increasingly worse this morning.  On assessment patients eye is red and swollen. Patient states that eye has been itching and painful.  Denies any visual problems.  Rates pain 4/10.  NAD at this time.

## 2015-07-28 NOTE — ED Notes (Signed)
Visual acuity completed , pt. Unable to read without glasses on  both eyes,  Reads at 20/25 on glasses ;both eyes.

## 2015-07-29 ENCOUNTER — Encounter (HOSPITAL_COMMUNITY): Payer: Medicare Other | Admitting: Internal Medicine

## 2015-11-20 ENCOUNTER — Other Ambulatory Visit (HOSPITAL_COMMUNITY): Payer: Self-pay | Admitting: *Deleted

## 2015-11-29 ENCOUNTER — Other Ambulatory Visit: Payer: Self-pay | Admitting: Cardiovascular Disease

## 2016-01-01 ENCOUNTER — Other Ambulatory Visit: Payer: Self-pay | Admitting: Internal Medicine

## 2016-02-21 ENCOUNTER — Encounter (HOSPITAL_COMMUNITY): Payer: Self-pay

## 2016-02-21 ENCOUNTER — Emergency Department (HOSPITAL_COMMUNITY): Payer: Medicare Other

## 2016-02-21 ENCOUNTER — Inpatient Hospital Stay (HOSPITAL_COMMUNITY)
Admission: EM | Admit: 2016-02-21 | Discharge: 2016-02-27 | DRG: 291 | Disposition: A | Discharge status: Home Health | Payer: Medicare Other | Attending: Internal Medicine | Admitting: Internal Medicine

## 2016-02-21 DIAGNOSIS — Z8249 Family history of ischemic heart disease and other diseases of the circulatory system: Secondary | ICD-10-CM

## 2016-02-21 DIAGNOSIS — J9602 Acute respiratory failure with hypercapnia: Secondary | ICD-10-CM | POA: Diagnosis present

## 2016-02-21 DIAGNOSIS — Z833 Family history of diabetes mellitus: Secondary | ICD-10-CM | POA: Diagnosis not present

## 2016-02-21 DIAGNOSIS — E876 Hypokalemia: Secondary | ICD-10-CM | POA: Diagnosis present

## 2016-02-21 DIAGNOSIS — Z823 Family history of stroke: Secondary | ICD-10-CM | POA: Diagnosis not present

## 2016-02-21 DIAGNOSIS — I5041 Acute combined systolic (congestive) and diastolic (congestive) heart failure: Secondary | ICD-10-CM

## 2016-02-21 DIAGNOSIS — E662 Morbid (severe) obesity with alveolar hypoventilation: Secondary | ICD-10-CM | POA: Diagnosis present

## 2016-02-21 DIAGNOSIS — I161 Hypertensive emergency: Secondary | ICD-10-CM | POA: Diagnosis present

## 2016-02-21 DIAGNOSIS — N179 Acute kidney failure, unspecified: Secondary | ICD-10-CM | POA: Diagnosis not present

## 2016-02-21 DIAGNOSIS — Z91010 Allergy to peanuts: Secondary | ICD-10-CM

## 2016-02-21 DIAGNOSIS — I119 Hypertensive heart disease without heart failure: Secondary | ICD-10-CM | POA: Diagnosis present

## 2016-02-21 DIAGNOSIS — J9601 Acute respiratory failure with hypoxia: Secondary | ICD-10-CM | POA: Diagnosis not present

## 2016-02-21 DIAGNOSIS — Z9981 Dependence on supplemental oxygen: Secondary | ICD-10-CM | POA: Diagnosis not present

## 2016-02-21 DIAGNOSIS — I5043 Acute on chronic combined systolic (congestive) and diastolic (congestive) heart failure: Secondary | ICD-10-CM | POA: Diagnosis present

## 2016-02-21 DIAGNOSIS — T502X5A Adverse effect of carbonic-anhydrase inhibitors, benzothiadiazides and other diuretics, initial encounter: Secondary | ICD-10-CM | POA: Diagnosis not present

## 2016-02-21 DIAGNOSIS — Z6841 Body Mass Index (BMI) 40.0 and over, adult: Secondary | ICD-10-CM | POA: Diagnosis not present

## 2016-02-21 DIAGNOSIS — Z91040 Latex allergy status: Secondary | ICD-10-CM | POA: Diagnosis not present

## 2016-02-21 DIAGNOSIS — R069 Unspecified abnormalities of breathing: Secondary | ICD-10-CM | POA: Diagnosis not present

## 2016-02-21 DIAGNOSIS — I110 Hypertensive heart disease with heart failure: Principal | ICD-10-CM | POA: Diagnosis present

## 2016-02-21 DIAGNOSIS — E872 Acidosis: Secondary | ICD-10-CM | POA: Diagnosis present

## 2016-02-21 DIAGNOSIS — Q780 Osteogenesis imperfecta: Secondary | ICD-10-CM

## 2016-02-21 DIAGNOSIS — I428 Other cardiomyopathies: Secondary | ICD-10-CM | POA: Diagnosis not present

## 2016-02-21 DIAGNOSIS — I5023 Acute on chronic systolic (congestive) heart failure: Secondary | ICD-10-CM | POA: Diagnosis not present

## 2016-02-21 DIAGNOSIS — Z9114 Patient's other noncompliance with medication regimen: Secondary | ICD-10-CM | POA: Diagnosis not present

## 2016-02-21 DIAGNOSIS — Z87891 Personal history of nicotine dependence: Secondary | ICD-10-CM

## 2016-02-21 DIAGNOSIS — Z91013 Allergy to seafood: Secondary | ICD-10-CM | POA: Diagnosis not present

## 2016-02-21 DIAGNOSIS — I509 Heart failure, unspecified: Secondary | ICD-10-CM

## 2016-02-21 DIAGNOSIS — R0600 Dyspnea, unspecified: Secondary | ICD-10-CM | POA: Diagnosis not present

## 2016-02-21 DIAGNOSIS — Z993 Dependence on wheelchair: Secondary | ICD-10-CM

## 2016-02-21 DIAGNOSIS — Z7982 Long term (current) use of aspirin: Secondary | ICD-10-CM | POA: Diagnosis not present

## 2016-02-21 DIAGNOSIS — E8729 Other acidosis: Secondary | ICD-10-CM

## 2016-02-21 DIAGNOSIS — R0602 Shortness of breath: Secondary | ICD-10-CM | POA: Diagnosis not present

## 2016-02-21 DIAGNOSIS — G4733 Obstructive sleep apnea (adult) (pediatric): Secondary | ICD-10-CM

## 2016-02-21 DIAGNOSIS — Z79899 Other long term (current) drug therapy: Secondary | ICD-10-CM

## 2016-02-21 HISTORY — DX: Other cardiomyopathies: I42.8

## 2016-02-21 LAB — COMPREHENSIVE METABOLIC PANEL
ALBUMIN: 3.1 g/dL — AB (ref 3.5–5.0)
ALT: 34 U/L (ref 17–63)
AST: 39 U/L (ref 15–41)
Alkaline Phosphatase: 57 U/L (ref 38–126)
Anion gap: 7 (ref 5–15)
BILIRUBIN TOTAL: 0.8 mg/dL (ref 0.3–1.2)
BUN: 17 mg/dL (ref 6–20)
CO2: 34 mmol/L — ABNORMAL HIGH (ref 22–32)
Calcium: 8.5 mg/dL — ABNORMAL LOW (ref 8.9–10.3)
Chloride: 101 mmol/L (ref 101–111)
Creatinine, Ser: 1.2 mg/dL (ref 0.61–1.24)
GFR calc Af Amer: >60 mL/min (ref >60)
GFR calc non Af Amer: >60 mL/min (ref >60)
GLUCOSE: 135 mg/dL — AB (ref 65–99)
POTASSIUM: 3.9 mmol/L (ref 3.5–5.1)
Sodium: 142 mmol/L (ref 135–145)
TOTAL PROTEIN: 7.1 g/dL (ref 6.5–8.1)

## 2016-02-21 LAB — CBC
HEMATOCRIT: 47.2 % (ref 39.0–52.0)
HEMOGLOBIN: 13.8 g/dL (ref 13.0–17.0)
MCH: 29.1 pg (ref 26.0–34.0)
MCHC: 29.2 g/dL — ABNORMAL LOW (ref 30.0–36.0)
MCV: 99.4 fL (ref 78.0–100.0)
Platelets: 196 10*3/uL (ref 150–400)
RBC: 4.75 MIL/uL (ref 4.22–5.81)
RDW: 12.7 % (ref 11.5–15.5)
WBC: 7.4 10*3/uL (ref 4.0–10.5)

## 2016-02-21 LAB — BLOOD GAS, ARTERIAL
ACID-BASE EXCESS: 6.5 mmol/L — AB (ref 0.0–2.0)
BICARBONATE: 35.3 mmol/L — AB (ref 20.0–28.0)
Delivery systems: POSITIVE
Drawn by: 331471
Expiratory PAP: 8
FIO2: 0.6
Inspiratory PAP: 14
O2 SAT: 98.2 %
PATIENT TEMPERATURE: 98.6
PCO2 ART: 72.6 mmHg — AB (ref 32.0–48.0)
PH ART: 7.309 — AB (ref 7.350–7.450)
PO2 ART: 134 mmHg — AB (ref 83.0–108.0)

## 2016-02-21 LAB — BLOOD GAS, VENOUS
Acid-Base Excess: 6 mmol/L — ABNORMAL HIGH (ref 0.0–2.0)
Bicarbonate: 37.3 mmol/L — ABNORMAL HIGH (ref 20.0–28.0)
FIO2: 1
O2 Saturation: 86.6 %
PATIENT TEMPERATURE: 98.6
PO2 VEN: 63 mmHg — AB (ref 32.0–45.0)
pCO2, Ven: 89.9 mmHg (ref 44.0–60.0)
pH, Ven: 7.242 — ABNORMAL LOW (ref 7.250–7.430)

## 2016-02-21 LAB — I-STAT TROPONIN, ED: Troponin i, poc: 0.06 ng/mL (ref 0.00–0.08)

## 2016-02-21 LAB — MRSA PCR SCREENING: MRSA by PCR: NEGATIVE

## 2016-02-21 LAB — BRAIN NATRIURETIC PEPTIDE: B Natriuretic Peptide: 172.3 pg/mL — ABNORMAL HIGH (ref 0.0–100.0)

## 2016-02-21 MED ORDER — ORAL CARE MOUTH RINSE
15.0000 mL | Freq: Two times a day (BID) | OROMUCOSAL | Status: DC
  Administered 2016-02-21 – 2016-02-23 (×4): 15 mL via OROMUCOSAL

## 2016-02-21 MED ORDER — LISINOPRIL 10 MG PO TABS
20.0000 mg | ORAL_TABLET | Freq: Every day | ORAL | Status: DC
  Administered 2016-02-21: 20 mg via ORAL
  Filled 2016-02-21 (×2): qty 2

## 2016-02-21 MED ORDER — LIP MEDEX EX OINT
TOPICAL_OINTMENT | CUTANEOUS | Status: AC
  Administered 2016-02-21: 14:00:00
  Filled 2016-02-21: qty 7

## 2016-02-21 MED ORDER — POTASSIUM CHLORIDE CRYS ER 20 MEQ PO TBCR
20.0000 meq | EXTENDED_RELEASE_TABLET | Freq: Two times a day (BID) | ORAL | Status: DC
  Administered 2016-02-21 – 2016-02-27 (×12): 20 meq via ORAL
  Filled 2016-02-21 (×12): qty 1

## 2016-02-21 MED ORDER — METOLAZONE 2.5 MG PO TABS
2.5000 mg | ORAL_TABLET | Freq: Every day | ORAL | Status: DC
  Administered 2016-02-21 – 2016-02-24 (×4): 2.5 mg via ORAL
  Filled 2016-02-21 (×4): qty 1

## 2016-02-21 MED ORDER — ONDANSETRON HCL 4 MG/2ML IJ SOLN: 4.0000 mg | Freq: Four times a day (QID) | INTRAMUSCULAR | Status: DC | PRN

## 2016-02-21 MED ORDER — FUROSEMIDE 10 MG/ML IJ SOLN
80.0000 mg | Freq: Once | INTRAMUSCULAR | Status: AC
  Administered 2016-02-21: 80 mg via INTRAVENOUS
  Filled 2016-02-21: qty 8

## 2016-02-21 MED ORDER — SODIUM CHLORIDE 0.9% FLUSH
3.0000 mL | Freq: Two times a day (BID) | INTRAVENOUS | Status: DC
  Administered 2016-02-21 – 2016-02-26 (×10): 3 mL via INTRAVENOUS

## 2016-02-21 MED ORDER — CHLORHEXIDINE GLUCONATE 0.12 % MT SOLN
15.0000 mL | Freq: Two times a day (BID) | OROMUCOSAL | Status: DC
  Administered 2016-02-22 – 2016-02-26 (×9): 15 mL via OROMUCOSAL
  Filled 2016-02-21 (×10): qty 15

## 2016-02-21 MED ORDER — ENOXAPARIN SODIUM 40 MG/0.4ML ~~LOC~~ SOLN
40.0000 mg | SUBCUTANEOUS | Status: DC
  Administered 2016-02-21 – 2016-02-26 (×6): 40 mg via SUBCUTANEOUS
  Filled 2016-02-21 (×6): qty 0.4

## 2016-02-21 MED ORDER — FUROSEMIDE 10 MG/ML IJ SOLN: 80.0000 mg | Freq: Two times a day (BID) | INTRAMUSCULAR | Status: DC

## 2016-02-21 MED ORDER — SODIUM CHLORIDE 0.9% FLUSH: 3.0000 mL | INTRAVENOUS | Status: DC | PRN

## 2016-02-21 MED ORDER — SODIUM CHLORIDE 0.9 % IV SOLN
250.0000 mL | INTRAVENOUS | Status: DC | PRN
  Administered 2016-02-21 – 2016-02-22 (×3): 250 mL via INTRAVENOUS

## 2016-02-21 MED ORDER — NITROGLYCERIN IN D5W 200-5 MCG/ML-% IV SOLN
0.0000 ug/min | INTRAVENOUS | Status: DC
  Administered 2016-02-22: 45 ug/min via INTRAVENOUS
  Filled 2016-02-21: qty 250

## 2016-02-21 MED ORDER — HYDRALAZINE HCL 25 MG PO TABS
25.0000 mg | ORAL_TABLET | Freq: Three times a day (TID) | ORAL | Status: DC
  Administered 2016-02-21 – 2016-02-27 (×18): 25 mg via ORAL
  Filled 2016-02-21 (×18): qty 1

## 2016-02-21 MED ORDER — ACETAMINOPHEN 325 MG PO TABS: 650.0000 mg | ORAL_TABLET | ORAL | Status: DC | PRN

## 2016-02-21 MED ORDER — NITROGLYCERIN IN D5W 200-5 MCG/ML-% IV SOLN
0.0000 ug/min | Freq: Once | INTRAVENOUS | Status: AC
  Administered 2016-02-21: 5 ug/min via INTRAVENOUS
  Filled 2016-02-21: qty 250

## 2016-02-21 MED ORDER — DEXTROSE 5 % IV SOLN
120.0000 mg | Freq: Two times a day (BID) | INTRAVENOUS | Status: DC
  Administered 2016-02-21 – 2016-02-23 (×4): 120 mg via INTRAVENOUS
  Filled 2016-02-21: qty 2
  Filled 2016-02-21: qty 12
  Filled 2016-02-21: qty 4
  Filled 2016-02-21: qty 12

## 2016-02-21 MED ORDER — NITROGLYCERIN 2 % TD OINT
1.0000 [in_us] | TOPICAL_OINTMENT | Freq: Once | TRANSDERMAL | Status: AC
  Administered 2016-02-21: 1 [in_us] via TOPICAL
  Filled 2016-02-21: qty 1

## 2016-02-21 NOTE — Progress Notes (Signed)
Utilization Review completed.  Chester Sibert RN CM  
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
## 2016-02-21 NOTE — Progress Notes (Signed)
Rt placed pt on BIPAP per  MD order.

## 2016-02-21 NOTE — ED Notes (Signed)
ED Provider at bedside. 
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
## 2016-02-21 NOTE — ED Triage Notes (Signed)
Pt brought in by EMS from home,  with c/o SOB x 1 hour patient was given 3 neb treatments ( Albuterol), 125 mg of Solumedrol by EMS. Pt does report that he has worsening abd distention and decreased urinary output.
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
## 2016-02-21 NOTE — ED Notes (Signed)
Informed lab BNP has not resulted-states they will run and post result

## 2016-02-21 NOTE — ED Notes (Signed)
Pt parents are at bedside. Pt oxygen saturation was went as low as 78 with 4LPM via . Pt was placed on a non-rebreather @ 12 LPM O2 sat came up to 94%.

## 2016-02-21 NOTE — ED Notes (Signed)
Made respiratory aware of Bipap order.

## 2016-02-22 ENCOUNTER — Encounter (HOSPITAL_COMMUNITY): Payer: Self-pay | Admitting: Internal Medicine

## 2016-02-22 ENCOUNTER — Inpatient Hospital Stay (HOSPITAL_COMMUNITY): Payer: Medicare Other

## 2016-02-22 DIAGNOSIS — I428 Other cardiomyopathies: Secondary | ICD-10-CM

## 2016-02-22 DIAGNOSIS — I509 Heart failure, unspecified: Secondary | ICD-10-CM

## 2016-02-22 DIAGNOSIS — I5023 Acute on chronic systolic (congestive) heart failure: Secondary | ICD-10-CM

## 2016-02-22 HISTORY — DX: Other cardiomyopathies: I42.8

## 2016-02-22 LAB — ECHOCARDIOGRAM COMPLETE
Height: 48 in
WEIGHTICAEL: 3619.07 [oz_av]

## 2016-02-22 LAB — BASIC METABOLIC PANEL
Anion gap: 8 (ref 5–15)
BUN: 25 mg/dL — AB (ref 6–20)
CHLORIDE: 95 mmol/L — AB (ref 101–111)
CO2: 38 mmol/L — ABNORMAL HIGH (ref 22–32)
CREATININE: 1.09 mg/dL (ref 0.61–1.24)
Calcium: 8.7 mg/dL — ABNORMAL LOW (ref 8.9–10.3)
GFR calc Af Amer: >60 mL/min (ref >60)
GFR calc non Af Amer: >60 mL/min (ref >60)
GLUCOSE: 111 mg/dL — AB (ref 65–99)
POTASSIUM: 3.5 mmol/L (ref 3.5–5.1)
SODIUM: 141 mmol/L (ref 135–145)

## 2016-02-22 MED ORDER — LISINOPRIL 20 MG PO TABS
40.0000 mg | ORAL_TABLET | Freq: Every day | ORAL | Status: DC
  Administered 2016-02-22 – 2016-02-23 (×2): 40 mg via ORAL
  Filled 2016-02-22 (×2): qty 4

## 2016-02-22 NOTE — Progress Notes (Signed)
PROGRESS NOTE    Jerry Edwards  ZSW:[id number] DOB: 1971/01/10 DOA: 02/21/2016 PCP: [name], MD    Brief Narrative:  Jerry Edwards is a 45 y.o. 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 Last night patient reports waking up from sleep secondary to dyspnea. This episode scared him, and EMS was called for transfer to the emergency department. He has never had a CHF exacerbation from his report. He sleeps on 2 pillows.  ED Course: Vitals: Afebrile. Pulse high 90s to low 100s. Respirations and 20s. Blood pressure elevated. In the 90%'s O2 on BiPAP Labs: Creatinine 1.20. Glucose 135. Albumin 3.1. BNP 172.3. I-STAT troponin 0.06. ABG significant for pH of 7.309, PCO2 of 72.6 while on BiPAP. Imaging: Chest x-ray concerning for interstitial pulmonary edema Medications/Course: Lasix 80 mg 1 and nitroglycerin drip started    Assessment & Plan:   Principal Problem:   Acute respiratory failure with hypoxia and hypercapnia (HCC) Active Problems:   Acute exacerbation of CHF (congestive heart failure) (HCC)   Hypertensive emergency   Hypertensive heart disease   Osteogenesis imperfecta   NICM (nonischemic cardiomyopathy) ([place])   #1 acute respiratory failure with hypoxia and hypercapnia secondary to acute systolic CHF exacerbation/nonischemic cardiomyopathy Patient had presented with worsening shortness of breath chest x-ray consistent with pulmonary edema, lower extremity edema requiring BiPAP. BNP was 172.3. Point-of-care troponin was 0.06. Patient denied any chest pain. Concern for possible dietary indiscretion. Patient with clinical improvement on BiPAP. Patient with a urine output of 3.5 L over the past 24 hours and -2.993 L during the hospitalization. Current weight is 226 pounds. Weight  at cardiology's office in March 2017 was 191 pounds. Dry weight appears to be 190-195lbs. Lasix dose increased per cardiology and currently at Lasix 120 mg IV every 12 hours. Continue hydralazine, lisinopril, Zaroxolyn, nitroglycerin drip. Will check a 2-D echo. Trial off BiPAP. Cardiology following and appreciate input and recommendations.  #2 hypertensive emergency Patient noted on admission to be in hypertensive emergency with blood pressure as high as 198/132. Blood pressure improved on nitro drip, hydralazine, Lasix, Zaroxolyn, lisinopril. Wean nitroglycerin drip. Increase lisinopril to 40 mg daily. Cardiology following and appreciate input and recommendations.  #3 osteogenesis imperfecta   DVT prophylaxis: Lovenox Code Status: Full Family Communication: Updated patient. No family at bedside. Disposition Plan: Remaining stepdown unit. Home when medically stable and clinically improved and back to baseline.   Consultants:   Cardiology: Dr. Irish Lack 02/21/2016  Procedures:   Chest x-ray 02/21/2016    Antimicrobials:  None   Subjective: On BiPAP. Patient denies any chest pain. Patient states shortness of breath improved since admission.  Objective: Vitals:   02/22/16 0730 02/22/16 0745 02/22/16 0800 02/22/16 0815  BP: (!) 164/84 (!) 171/99 (!) 153/129 (!) 149/73  Pulse: 88 81 (!) 110 76  Resp: (!) 22 (!) 23 (!) 29 15  Temp:   98.2 F (36.8 C)   TempSrc:   Oral   SpO2: 93% 94% 100% 95%  Weight:      Height:        Intake/Output Summary (Last 24 hours) at 02/22/16 0926 Last data filed at 02/22/16 0800  Gross per 24 hour  Intake           551.79 ml  Output             2250 ml  Net         -1698.21 ml   Filed Weights   02/21/16 0558 02/21/16 1213 02/22/16 0500  Weight: 102.1 kg (225 lb) 101.8 kg (224 lb 6.9 oz) 102.6 kg (226 lb 3.1 oz)    Examination:  General exam: Appears calm and comfortable. On BIPAP. Respiratory system: bibasilar crackles. No wheezing.  On BiPAP. Cardiovascular system: S1 & S2 heard, RRR. No JVD, murmurs, rubs, gallops or clicks. Trace lower extremity edema. Gastrointestinal system: Abdomen is nondistended, soft and nontender. No organomegaly or masses felt. Normal bowel sounds heard. Central nervous system: Alert and oriented. No focal neurological deficits. Extremities: Symmetric 5 x 5 power. Skin: No rashes, lesions or ulcers Psychiatry: Judgement and insight appear normal. Mood & affect appropriate.     Data Reviewed: I have personally reviewed following labs and imaging studies  CBC:  Recent Labs Lab 02/21/16 0620  WBC 7.4  HGB 13.8  HCT 47.2  MCV 99.4  PLT 128   Basic Metabolic Panel:  Recent Labs Lab 02/21/16 0620 02/22/16 0345  NA 142 141  K 3.9 3.5  CL 101 95*  CO2 34* 38*  GLUCOSE 135* 111*  BUN 17 25*  CREATININE 1.20 1.09  CALCIUM 8.5* 8.7*   GFR: Estimated Creatinine Clearance: 66 mL/min (by C-G formula based on SCr of 1.09 mg/dL). Liver Function Tests:  Recent Labs Lab 02/21/16 0620  AST 39  ALT 34  ALKPHOS 57  BILITOT 0.8  PROT 7.1  ALBUMIN 3.1*   No results for input(s): LIPASE, AMYLASE in the last 168 hours. No results for input(s): AMMONIA in the last 168 hours. Coagulation Profile: No results for input(s): INR, PROTIME in the last 168 hours. Cardiac Enzymes: No results for input(s): CKTOTAL, CKMB, CKMBINDEX, TROPONINI in the last 168 hours. BNP (last 3 results) No results for input(s): PROBNP in the last 8760 hours. HbA1C: No results for input(s): HGBA1C in the last 72 hours. CBG: No results for input(s): GLUCAP in the last 168 hours. Lipid Profile: No results for input(s): CHOL, HDL, LDLCALC, TRIG, CHOLHDL, LDLDIRECT in the last 72 hours. Thyroid Function Tests: No results for input(s): TSH, T4TOTAL, FREET4, T3FREE, THYROIDAB in the last 72 hours. Anemia Panel: No results for input(s): VITAMINB12, FOLATE, FERRITIN, TIBC, IRON, RETICCTPCT in the last 72  hours. Sepsis Labs: No results for input(s): PROCALCITON, LATICACIDVEN in the last 168 hours.  Recent Results (from the past 240 hour(s))  MRSA PCR Screening     Status: None   Collection Time: 02/21/16 12:09 PM  Result Value Ref Range Status   MRSA by PCR NEGATIVE NEGATIVE Final    Comment:        The GeneXpert MRSA Assay (FDA approved for NASAL specimens only), is one component of a comprehensive MRSA colonization surveillance program. It is not intended to diagnose MRSA infection nor to guide or monitor treatment for MRSA infections.          Radiology Studies: Dg Chest 2 View  Result Date: 02/21/2016 CLINICAL DATA:  Acute onset of shortness of breath and difficulty breathing. Decreased O2 saturation. Initial encounter. EXAM: CHEST  2 VIEW COMPARISON:  Chest radiograph performed 03/28/2015 FINDINGS: The lungs are well-aerated. Vascular congestion is noted. Increased interstitial markings raise concern for mild interstitial edema. There is no evidence of pleural effusion or pneumothorax. The heart is mildly enlarged. No acute osseous abnormalities are seen. Chronic compression deformity is noted at the lower thoracic spine. IMPRESSION: Vascular congestion and mild cardiomegaly. Increased interstitial markings  raise concern for mild interstitial edema. Electronically Signed   By: Garald Balding M.D.   On: 02/21/2016 06:41        Scheduled Meds: . chlorhexidine  15 mL Mouth Rinse BID  . enoxaparin (LOVENOX) injection  40 mg Subcutaneous Q24H  . furosemide  120 mg Intravenous BID  . hydrALAZINE  25 mg Oral Q8H  . lisinopril  20 mg Oral Daily  . mouth rinse  15 mL Mouth Rinse q12n4p  . metolazone  2.5 mg Oral Daily  . potassium chloride  20 mEq Oral BID  . sodium chloride flush  3 mL Intravenous Q12H   Continuous Infusions: . nitroGLYCERIN 45 mcg/min (02/22/16 0800)     LOS: 1 day    Time spent: 62 mins    THOMPSON,DANIEL, MD [facility] Pager [phone]  [phone]  If 7PM-7AM, please contact night-coverage [email or web address] Password [facility] 02/22/2016, 9:26 AM

## 2016-02-22 NOTE — Progress Notes (Signed)
*  PRELIMINARY RESULTS* Echocardiogram 2D Echocardiogram has been performed.  Beryle Beams 02/22/2016, 10:29 AM

## 2016-02-22 NOTE — Progress Notes (Signed)
Subjective: Breathing is better  No CP Objective: Vitals:   02/22/16 0730 02/22/16 0745 02/22/16 0800 02/22/16 0815  BP: (!) 164/84 (!) 171/99 (!) 153/129 (!) 149/73  Pulse: 88 81 (!) 110 76  Resp: (!) 22 (!) 23 (!) 29 15  Temp:   98.2 F (36.8 C)   TempSrc:   Oral   SpO2: 93% 94% 100% 95%  Weight:      Height:       Weight change: -9.2 oz (-0.259 kg)  Intake/Output Summary (Last 24 hours) at 02/22/16 1610 Last data filed at 02/22/16 0800  Gross per 24 hour  Intake           551.79 ml  Output             2250 ml  Net         -1698.21 ml   I/O  Net neg 4.1 L  General: Alert, awake, oriented x3, in no acute distress Neck:  JVP is normal Heart: Regular rate and rhythm, without murmurs, rubs, gallops.  Lungs: Rales at bases   Exemities:  Tr edema.   Neuro: Grossly intact, nonfocal.  Tele:  SR    Lab Results: Results for orders placed or performed during the hospital encounter of 02/21/16 (from the past 24 hour(s))  Blood gas, arterial ([facility] & [facility] ONLY)     Status: Abnormal   Collection Time: 02/21/16 10:25 AM  Result Value Ref Range   FIO2 0.60    Delivery systems BILEVEL POSITIVE AIRWAY PRESSURE    Inspiratory PAP 14    Expiratory PAP 8    pH, Arterial 7.309 (L) 7.350 - 7.450   pCO2 arterial 72.6 (HH) 32.0 - 48.0 mmHg   pO2, Arterial 134 (H) 83.0 - 108.0 mmHg   Bicarbonate 35.3 (H) 20.0 - 28.0 mmol/L   Acid-Base Excess 6.5 (H) 0.0 - 2.0 mmol/L   O2 Saturation 98.2 %   Patient temperature 98.6    Collection site RIGHT RADIAL    Drawn by [id number]    Sample type ARTERIAL DRAW    Allens test (pass/fail) PASS PASS  MRSA PCR Screening     Status: None   Collection Time: 02/21/16 12:09 PM  Result Value Ref Range   MRSA by PCR NEGATIVE NEGATIVE  Basic metabolic panel     Status: Abnormal   Collection Time: 02/22/16  3:45 AM  Result Value Ref Range   Sodium 141 135 - 145 mmol/L   Potassium 3.5 3.5 - 5.1 mmol/L   Chloride 95 (L) 101 - 111 mmol/L   CO2 38 (H) 22 - 32  mmol/L   Glucose, Bld 111 (H) 65 - 99 mg/dL   BUN 25 (H) 6 - 20 mg/dL   Creatinine, Ser 1.09 0.61 - 1.24 mg/dL   Calcium 8.7 (L) 8.9 - 10.3 mg/dL   GFR calc non Af Amer >60 >60 mL/min   GFR calc Af Amer >60 >60 mL/min   Anion gap 8 5 - 15    Studies/Results: No results found.  Medications: REviewed   @PROBHOSP @  1  Acute on chronic systolic CHF   Echo today LVEF 40%   Limited study     Volume is improving  I would continue IV lasix  P;ace pt on low Na diet with 1800 cc Consult dietary for pt educatoin   Pt admits to eating a lot of ice at night Pt instructed to weigh self daily  He is not doing this  2  Pulmoary HTN   Diurese     3  HTN  4  OSA  Not using CPAP at home    LOS: 1 day   Jerry Edwards 02/22/2016, 9:26 AM

## 2016-02-23 DIAGNOSIS — I5043 Acute on chronic combined systolic (congestive) and diastolic (congestive) heart failure: Secondary | ICD-10-CM

## 2016-02-23 LAB — CBC
HCT: 51.5 % (ref 39.0–52.0)
HEMOGLOBIN: 15.3 g/dL (ref 13.0–17.0)
MCH: 28.8 pg (ref 26.0–34.0)
MCHC: 29.7 g/dL — ABNORMAL LOW (ref 30.0–36.0)
MCV: 96.8 fL (ref 78.0–100.0)
Platelets: 235 10*3/uL (ref 150–400)
RBC: 5.32 MIL/uL (ref 4.22–5.81)
RDW: 13.1 % (ref 11.5–15.5)
WBC: 9.9 10*3/uL (ref 4.0–10.5)

## 2016-02-23 LAB — BASIC METABOLIC PANEL
ANION GAP: 10 (ref 5–15)
BUN: 40 mg/dL — ABNORMAL HIGH (ref 6–20)
CALCIUM: 9.6 mg/dL (ref 8.9–10.3)
CO2: 40 mmol/L — AB (ref 22–32)
Chloride: 88 mmol/L — ABNORMAL LOW (ref 101–111)
Creatinine, Ser: 1.54 mg/dL — ABNORMAL HIGH (ref 0.61–1.24)
GFR, EST NON AFRICAN AMERICAN: 53 mL/min — AB (ref >60)
Glucose, Bld: 105 mg/dL — ABNORMAL HIGH (ref 65–99)
POTASSIUM: 3.4 mmol/L — AB (ref 3.5–5.1)
Sodium: 138 mmol/L (ref 135–145)

## 2016-02-23 LAB — MAGNESIUM: MAGNESIUM: 2 mg/dL (ref 1.7–2.4)

## 2016-02-23 MED ORDER — CARVEDILOL 3.125 MG PO TABS
3.1250 mg | ORAL_TABLET | Freq: Two times a day (BID) | ORAL | Status: DC
  Administered 2016-02-23 – 2016-02-27 (×8): 3.125 mg via ORAL
  Filled 2016-02-23 (×8): qty 1

## 2016-02-23 NOTE — Progress Notes (Signed)
PROGRESS NOTE    Jerry Edwards  HYI:[id number] DOB: Oct 30, 1970 DOA: 02/21/2016 PCP: Jerry Shire, MD    Brief Narrative:  Jerry Edwards is a 45 y.o. 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 Last night patient reports waking up from sleep secondary to dyspnea. This episode scared him, and EMS was called for transfer to the emergency department. He has never had a CHF exacerbation from his report. He sleeps on 2 pillows.  ED Course: Vitals: Afebrile. Pulse high 90s to low 100s. Respirations and 20s. Blood pressure elevated. In the 90%'s O2 on BiPAP Labs: Creatinine 1.20. Glucose 135. Albumin 3.1. BNP 172.3. I-STAT troponin 0.06. ABG significant for pH of 7.309, PCO2 of 72.6 while on BiPAP. Imaging: Chest x-ray concerning for interstitial pulmonary edema Medications/Course: Lasix 80 mg 1 and nitroglycerin drip started    Assessment & Plan:   Principal Problem:   Acute respiratory failure with hypoxia and hypercapnia (HCC) Active Problems:   Acute exacerbation of CHF (congestive heart failure) (HCC)   Hypertensive emergency   Hypertensive heart disease   Osteogenesis imperfecta   NICM (nonischemic cardiomyopathy) ([place])   #1 acute respiratory failure with hypoxia and hypercapnia secondary to acute systolic CHF exacerbation/nonischemic cardiomyopathy Patient had presented with worsening shortness of breath chest x-ray consistent with pulmonary edema, lower extremity edema requiring BiPAP. BNP was 172.3. Likely secondary to dietary indiscretions. Point-of-care troponin was 0.06. Patient denied any chest pain. Concern for possible dietary indiscretion. Patient with clinical improvement on BiPAP. Patient with a urine output of 2.9 L over the past 24 hours and - 5.333 L during the  hospitalization. Current weight is 222 pounds. Weight at cardiology's office in March 2017 was 191 pounds. Dry weight appears to be 190-195lbs. Lasix dose was increased per cardiology and was at Lasix 120 mg IV every 12 hours. Bump in creatinine and a such IV Lasix has been discontinued. Nitroglycerin drip has been weaned off. 2-D echo with a EF of 40% with diffuse hypokinesis and grade 1 diastolic dysfunction. EF has improved from prior 2-D echo. Continue hydralazine, lisinopril, Zaroxolyn.Trial off BiPAP. Will likely be transitioned to oral Lasix tomorrow however will defer to cardiology. Dietitian has been consulted. Cardiology following and appreciate input and recommendations.  #2 hypertensive emergency Patient noted on admission to be in hypertensive emergency with blood pressure as high as 198/132. Blood pressure improved on nitro drip, hydralazine, Lasix, Zaroxolyn, lisinopril. Nitroglycerin drip has been currently weaned off. Cardiology following and appreciate input and recommendations.  #3 osteogenesis imperfecta   DVT prophylaxis: Lovenox Code Status: Full Family Communication: Updated patient. No family at bedside. Disposition Plan: Remaining stepdown unit. Home when medically stable and clinically improved and back to baseline.   Consultants:   Cardiology: Dr. Irish Lack 02/21/2016  Procedures:   Chest x-ray 02/21/2016  2-D echo 02/22/2016  Antimicrobials:  None   Subjective: On BiPAP. Patient sleeping easily arousable. Shortness of breath improving. No chest pain.  Objective: Vitals:   02/23/16 0730 02/23/16 0745 02/23/16 0800 02/23/16 0815  BP: (!) 106/43 119/64  125/69  Pulse: 89 90 89 92  Resp:      Temp:   97.8 F (36.6 C)   TempSrc:   Axillary   SpO2: 94% 96% 95% 94%  Weight:      Height:        Intake/Output Summary (Last 24  hours) at 02/23/16 0847 Last data filed at 02/23/16 0800  Gross per 24 hour  Intake           559.98 ml  Output              2900 ml  Net         -2340.02 ml   Filed Weights   02/21/16 1213 02/22/16 0500 02/23/16 0500  Weight: 101.8 kg (224 lb 6.9 oz) 102.6 kg (226 lb 3.1 oz) 100.8 kg (222 lb 3.6 oz)    Examination:  General exam: Appears calm and comfortable. On BIPAP. Respiratory system: CTAB. No wheezing. On BiPAP. Cardiovascular system: S1 & S2 heard, RRR. No JVD, murmurs, rubs, gallops or clicks. Trace lower extremity edema. Gastrointestinal system: Abdomen is nondistended, soft and nontender. No organomegaly or masses felt. Normal bowel sounds heard. Central nervous system: Alert and oriented. No focal neurological deficits. Extremities: Symmetric 5 x 5 power. Skin: No rashes, lesions or ulcers Psychiatry: Judgement and insight appear normal. Mood & affect appropriate.     Data Reviewed: I have personally reviewed following labs and imaging studies  CBC:  Recent Labs Lab 02/21/16 0620 02/23/16 0348  WBC 7.4 9.9  HGB 13.8 15.3  HCT 47.2 51.5  MCV 99.4 96.8  PLT 196 638   Basic Metabolic Panel:  Recent Labs Lab 02/21/16 0620 02/22/16 0345 02/23/16 0348  NA 142 141 138  K 3.9 3.5 3.4*  CL 101 95* 88*  CO2 34* 38* 40*  GLUCOSE 135* 111* 105*  BUN 17 25* 40*  CREATININE 1.20 1.09 1.54*  CALCIUM 8.5* 8.7* 9.6  MG  --   --  2.0   GFR: Estimated Creatinine Clearance: 46.1 mL/min (by C-G formula based on SCr of 1.54 mg/dL (H)). Liver Function Tests:  Recent Labs Lab 02/21/16 0620  AST 39  ALT 34  ALKPHOS 57  BILITOT 0.8  PROT 7.1  ALBUMIN 3.1*   No results for input(s): LIPASE, AMYLASE in the last 168 hours. No results for input(s): AMMONIA in the last 168 hours. Coagulation Profile: No results for input(s): INR, PROTIME in the last 168 hours. Cardiac Enzymes: No results for input(s): CKTOTAL, CKMB, CKMBINDEX, TROPONINI in the last 168 hours. BNP (last 3 results) No results for input(s): PROBNP in the last 8760 hours. HbA1C: No results for input(s): HGBA1C in the last  72 hours. CBG: No results for input(s): GLUCAP in the last 168 hours. Lipid Profile: No results for input(s): CHOL, HDL, LDLCALC, TRIG, CHOLHDL, LDLDIRECT in the last 72 hours. Thyroid Function Tests: No results for input(s): TSH, T4TOTAL, FREET4, T3FREE, THYROIDAB in the last 72 hours. Anemia Panel: No results for input(s): VITAMINB12, FOLATE, FERRITIN, TIBC, IRON, RETICCTPCT in the last 72 hours. Sepsis Labs: No results for input(s): PROCALCITON, LATICACIDVEN in the last 168 hours.  Recent Results (from the past 240 hour(s))  MRSA PCR Screening     Status: None   Collection Time: 02/21/16 12:09 PM  Result Value Ref Range Status   MRSA by PCR NEGATIVE NEGATIVE Final    Comment:        The GeneXpert MRSA Assay (FDA approved for NASAL specimens only), is one component of a comprehensive MRSA colonization surveillance program. It is not intended to diagnose MRSA infection nor to guide or monitor treatment for MRSA infections.          Radiology Studies: No results found.      Scheduled Meds: . chlorhexidine  15 mL Mouth Rinse BID  .  enoxaparin (LOVENOX) injection  40 mg Subcutaneous Q24H  . hydrALAZINE  25 mg Oral Q8H  . lisinopril  40 mg Oral Daily  . mouth rinse  15 mL Mouth Rinse q12n4p  . metolazone  2.5 mg Oral Daily  . potassium chloride  20 mEq Oral BID  . sodium chloride flush  3 mL Intravenous Q12H   Continuous Infusions: . nitroGLYCERIN Stopped (02/23/16 0318)     LOS: 2 days    Time spent: 46 mins    Marsden Zaino, MD [facility] Pager [phone]  If 7PM-7AM, please contact night-coverage [email or web address] Password [facility] 02/23/2016, 8:47 AM

## 2016-02-23 NOTE — Progress Notes (Signed)
Patient arrived from ICU at shift change. Vitals obtained and stable and telemetry applied. Patient and family oriented to the room and equipment.

## 2016-02-23 NOTE — Progress Notes (Signed)
   Subjective: Pt sleeping comfortably   Objective: Vitals:   02/23/16 0400 02/23/16 0500 02/23/16 0600 02/23/16 0700  BP: (!) 115/40  (!) 123/57 112/64  Pulse: 82 83 91 84  Resp:      Temp:      TempSrc:      SpO2: 93% 97% 95% 96%  Weight:  222 lb 3.6 oz (100.8 kg)    Height:       Weight change: -2 lb 3.3 oz (-1 kg)  Intake/Output Summary (Last 24 hours) at 02/23/16 0806 Last data filed at 02/23/16 0700  Gross per 24 hour  Intake           487.98 ml  Output             2900 ml  Net         -2412.02 ml   Net neg 5.4 L  General: Alert, awake, oriented x3, in no acute distress Neck:  JVP is normal Heart: Regular rate and rhythm, without murmurs, rubs, gallops.  Lungs: Clear to auscultation.  No rales or wheezes. Exemities:  No edema.   Neuro: Grossly intact, nonfocal.  Tel  SR   Lab Results: Results for orders placed or performed during the hospital encounter of 02/21/16 (from the past 24 hour(s))  Basic metabolic panel     Status: Abnormal   Collection Time: 02/23/16  3:48 AM  Result Value Ref Range   Sodium 138 135 - 145 mmol/L   Potassium 3.4 (L) 3.5 - 5.1 mmol/L   Chloride 88 (L) 101 - 111 mmol/L   CO2 40 (H) 22 - 32 mmol/L   Glucose, Bld 105 (H) 65 - 99 mg/dL   BUN 40 (H) 6 - 20 mg/dL   Creatinine, Ser 1.54 (H) 0.61 - 1.24 mg/dL   Calcium 9.6 8.9 - 10.3 mg/dL   GFR calc non Af Amer 53 (L) >60 mL/min   GFR calc Af Amer >60 >60 mL/min   Anion gap 10 5 - 15  CBC     Status: Abnormal   Collection Time: 02/23/16  3:48 AM  Result Value Ref Range   WBC 9.9 4.0 - 10.5 K/uL   RBC 5.32 4.22 - 5.81 MIL/uL   Hemoglobin 15.3 13.0 - 17.0 g/dL   HCT 51.5 39.0 - 52.0 %   MCV 96.8 78.0 - 100.0 fL   MCH 28.8 26.0 - 34.0 pg   MCHC 29.7 (L) 30.0 - 36.0 g/dL   RDW 13.1 11.5 - 15.5 %   Platelets 235 150 - 400 K/uL  Magnesium     Status: None   Collection Time: 02/23/16  3:48 AM  Result Value Ref Range   Magnesium 2.0 1.7 - 2.4 mg/dL    Studies/Results: No results  found.  Medications: Reviewed   @PROBHOSP @  1  Acute on chornic systolic CHF  Improved from yesterday  Would hold lasix with bump in creatinine  Would prob restart oral tomorrow   Start low dose coreg   Pt should have dietary to see  Diet eduation  2  HTN   BP OK  3  OSA  Using CPAP    LOS: 2 days   Jerry Edwards 02/23/2016, 8:06 AM

## 2016-02-23 NOTE — Plan of Care (Signed)
Problem: Food- and Nutrition-Related Knowledge Deficit (NB-1.1) Goal: Nutrition education Formal process to instruct or train a patient/client in a skill or to impart knowledge to help patients/clients voluntarily manage or modify food choices and eating behavior to maintain or improve health. Outcome: Completed/Met Date Met: 02/23/16 Nutrition Education Note  RD consulted for nutrition education regarding new onset CHF.  RD provided "Heart Failure Nutrition Therapy" handout from the Academy of [employer]. Reviewed patient's dietary recall. Provided examples on ways to decrease sodium intake in diet. Discouraged intake of processed foods and use of salt shaker. Encouraged fresh fruits and vegetables as well as whole grain sources of carbohydrates to maximize fiber intake.   RD discussed why it is important for patient to adhere to diet recommendations, and emphasized the role of fluids, foods to avoid, and importance of weighing self daily. Teach back method used.  Expect fair compliance. Unsure if patient has support at home.  Body mass index is 67.81 kg/m. Pt meets criteria for morbid obesity based on current BMI.  Current diet order is Heart Healthy. Labs and medications reviewed. No further nutrition interventions warranted at this time. If additional nutrition issues arise, please re-consult RD.   Clayton Bibles, MS, RD, LDN Pager: [phone] After Hours Pager: [phone]

## 2016-02-24 DIAGNOSIS — J9601 Acute respiratory failure with hypoxia: Secondary | ICD-10-CM

## 2016-02-24 DIAGNOSIS — N179 Acute kidney failure, unspecified: Secondary | ICD-10-CM | POA: Diagnosis not present

## 2016-02-24 DIAGNOSIS — J9602 Acute respiratory failure with hypercapnia: Secondary | ICD-10-CM

## 2016-02-24 LAB — BASIC METABOLIC PANEL
Anion gap: 11 (ref 5–15)
BUN: 57 mg/dL — AB (ref 6–20)
CALCIUM: 9.1 mg/dL (ref 8.9–10.3)
CO2: 37 mmol/L — ABNORMAL HIGH (ref 22–32)
CREATININE: 1.79 mg/dL — AB (ref 0.61–1.24)
Chloride: 87 mmol/L — ABNORMAL LOW (ref 101–111)
GFR calc Af Amer: 51 mL/min — ABNORMAL LOW (ref >60)
GFR, EST NON AFRICAN AMERICAN: 44 mL/min — AB (ref >60)
GLUCOSE: 119 mg/dL — AB (ref 65–99)
POTASSIUM: 3.3 mmol/L — AB (ref 3.5–5.1)
SODIUM: 135 mmol/L (ref 135–145)

## 2016-02-24 MED ORDER — FUROSEMIDE 40 MG PO TABS
40.0000 mg | ORAL_TABLET | Freq: Two times a day (BID) | ORAL | Status: DC
Start: 2016-02-24 — End: 2016-02-25
  Administered 2016-02-24: 40 mg via ORAL
  Filled 2016-02-24: qty 1

## 2016-02-24 MED ORDER — POTASSIUM CHLORIDE CRYS ER 20 MEQ PO TBCR
40.0000 meq | EXTENDED_RELEASE_TABLET | Freq: Once | ORAL | Status: AC
  Administered 2016-02-24: 40 meq via ORAL
  Filled 2016-02-24: qty 2

## 2016-02-24 NOTE — Progress Notes (Signed)
   Subjective: Feeling better Has CPAP on   Objective: Vitals:   02/23/16 1901 02/23/16 2052 02/23/16 2307 02/24/16 0549  BP: 106/64 112/87  116/60  Pulse: 89 88 84 80  Resp: 20  20 18   Temp: 98.5 F (36.9 C) 99.1 F (37.3 C)  98.4 F (36.9 C)  TempSrc: Oral Oral  Oral  SpO2: 93% 95% 96% 94%  Weight:    97.5 kg (214 lb 15.2 oz)  Height:       Weight change: -3.3 kg (-7 lb 4.4 oz)  Intake/Output Summary (Last 24 hours) at 02/24/16 1042 Last data filed at 02/24/16 0600  Gross per 24 hour  Intake            253.5 ml  Output             1300 ml  Net          -1046.5 ml   Net neg 5.4 L  Affect appropriate Back male osteogenic imperfecta  HEENT: normal Neck supple with no adenopathy JVP normal no bruits no thyromegaly Lungs inspitory crackles with no wheezing and good diaphragmatic motion Heart:  S1/S2 no murmur, no rub, gallop or click PMI normal Abdomen: benighn, BS positve, no tenderness, no AAA no bruit.  No HSM or HJR Distal pulses intact with no bruits Compression device on  Neuro non-focal Skin warm and dry No muscular weakness   Tel  SR   Lab Results: Results for orders placed or performed during the hospital encounter of 02/21/16 (from the past 24 hour(s))  Basic metabolic panel     Status: Abnormal   Collection Time: 02/24/16  4:41 AM  Result Value Ref Range   Sodium 135 135 - 145 mmol/L   Potassium 3.3 (L) 3.5 - 5.1 mmol/L   Chloride 87 (L) 101 - 111 mmol/L   CO2 37 (H) 22 - 32 mmol/L   Glucose, Bld 119 (H) 65 - 99 mg/dL   BUN 57 (H) 6 - 20 mg/dL   Creatinine, Ser 1.79 (H) 0.61 - 1.24 mg/dL   Calcium 9.1 8.9 - 10.3 mg/dL   GFR calc non Af Amer 44 (L) >60 mL/min   GFR calc Af Amer 51 (L) >60 mL/min   Anion gap 11 5 - 15    Studies/Results: No results found.  Medications: Reviewed   @PROBHOSP @  1  Acute on chornic systolic CHF improved change to oral lasix 40 bid good Diuresis Estimated EF by echo 40%   2  HTN   BP OK  3  OSA  Using  CPAP     LOS: 3 days   Jenkins Rouge 02/24/2016, 10:42 AM Patient ID: Jerry Edwards, male   DOB: [birth year], 45 y.o.   MRN: [id number]

## 2016-02-24 NOTE — Progress Notes (Signed)
PROGRESS NOTE    Jerry Edwards  GXQ:[id number] DOB: [birth year] DOA: 02/21/2016 PCP: [name], MD    Brief Narrative:  Jerry Edwards is a 45 y.o. 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 Last night patient reports waking up from sleep secondary to dyspnea. This episode scared him, and EMS was called for transfer to the emergency department. He has never had a CHF exacerbation from his report. He sleeps on 2 pillows.  ED Course: Vitals: Afebrile. Pulse high 90s to low 100s. Respirations and 20s. Blood pressure elevated. In the 90%'s O2 on BiPAP Labs: Creatinine 1.20. Glucose 135. Albumin 3.1. BNP 172.3. I-STAT troponin 0.06. ABG significant for pH of 7.309, PCO2 of 72.6 while on BiPAP. Imaging: Chest x-ray concerning for interstitial pulmonary edema Medications/Course: Lasix 80 mg 1 and nitroglycerin drip started    Assessment & Plan:   Principal Problem:   Acute respiratory failure with hypoxia and hypercapnia (HCC) Active Problems:   Acute exacerbation of CHF (congestive heart failure) (HCC)   Hypertensive emergency   Hypertensive heart disease   Osteogenesis imperfecta   NICM (nonischemic cardiomyopathy) ([place])   AKI (acute kidney injury) ([place])   #1 acute respiratory failure with hypoxia and hypercapnia secondary to acute systolic CHF exacerbation/nonischemic cardiomyopathy Patient had presented with worsening shortness of breath chest x-ray consistent with pulmonary edema, lower extremity edema requiring BiPAP. BNP was 172.3. Likely secondary to dietary indiscretions. Point-of-care troponin was 0.06. Patient denied any chest pain. Concern for possible dietary indiscretion. Patient with clinical improvement on BiPAP. Patient with a urine output of 1.3 L over the past 24 hours  and - 6.369 L during the hospitalization. Current weight is 214 pounds from 222 pounds. Weight at cardiology's office in March 2017 was 191 pounds. Dry weight appears to be 190-195lbs. Lasix dose was increased per cardiology and was at Lasix 120 mg IV every 12 hours. Bump in creatinine and asw such IV Lasix has been discontinued. Nitroglycerin drip has been weaned off. 2-D echo with a EF of 40% with diffuse hypokinesis and grade 1 diastolic dysfunction. EF has improved from prior 2-D echo. Continue hydralazine, coreg. Discontinue lisinopril, Zaroxolyn.transition to oral Lasix 40 mg twice daily per cardiology recommendations. Patient back on 4 L nasal cannula.  Dietitian has been consulted. Cardiology following and appreciate input and recommendations.  #2 hypertensive emergency Patient noted on admission to be in hypertensive emergency with blood pressure as high as 198/132. Blood pressure improved on nitro drip, hydralazine, Lasix, Zaroxolyn, lisinopril. Nitroglycerin drip has been currently weaned off. Continue hydralazine, Coreg. Will discontinue lisinopril and Zaroxolyn secondary to increasing creatinine. Transition to oral Lasix today 40 mg twice daily. Cardiology following and appreciate input and recommendations.  #3 osteogenesis imperfecta  #4 elevated creatinine/acute kidney injury Likely secondary to aggressive diuresis. Renal function continue to trend upward and creatinine currently at 1.79 today from 1.09 on 02/22/2016. IV Lasix were discontinued on 02/23/2016. Patient received a dose of Zaroxolyn today. Discontinue Zaroxolyn. Discontinue ACE inhibitor. Will transition patient to oral Lasix 40 mg twice daily starting tonight. Monitor renal function closely.   DVT prophylaxis: Lovenox Code Status: Full Family Communication: Updated patient. No family at bedside. Disposition Plan: Home when medically stable and clinically improved and back to baseline, and when renal function starts to trend  back down and improving.   Consultants:   Cardiology:  Dr. Irish Lack 02/21/2016  Procedures:   Chest x-ray 02/21/2016  2-D echo 02/22/2016  Antimicrobials:  None   Subjective: Patient denies SOB. No CP. Feeling better. No chest pain.  Objective: Vitals:   02/23/16 2052 02/23/16 2307 02/24/16 0549 02/24/16 1451  BP: 112/87  116/60 108/60  Pulse: 88 84 80 91  Resp:  20 18 18   Temp: 99.1 F (37.3 C)  98.4 F (36.9 C) 98.3 F (36.8 C)  TempSrc: Oral  Oral Oral  SpO2: 95% 96% 94% 96%  Weight:   97.5 kg (214 lb 15.2 oz)   Height:        Intake/Output Summary (Last 24 hours) at 02/24/16 1506 Last data filed at 02/24/16 1049  Gross per 24 hour  Intake            443.5 ml  Output              900 ml  Net           -456.5 ml   Filed Weights   02/22/16 0500 02/23/16 0500 02/24/16 0549  Weight: 102.6 kg (226 lb 3.1 oz) 100.8 kg (222 lb 3.6 oz) 97.5 kg (214 lb 15.2 oz)    Examination:  General exam: Appears calm and comfortable. On BIPAP. Respiratory system: Bibasilar crackle. CTAB. No wheezing. Off CPAP Cardiovascular system: S1 & S2 heard, RRR. No JVD, murmurs, rubs, gallops or clicks. Trace lower extremity edema. Gastrointestinal system: Abdomen is nondistended, soft and nontender. No organomegaly or masses felt. Normal bowel sounds heard. Central nervous system: Alert and oriented. No focal neurological deficits. Extremities: Symmetric 5 x 5 power. Skin: No rashes, lesions or ulcers Psychiatry: Judgement and insight appear normal. Mood & affect appropriate.     Data Reviewed: I have personally reviewed following labs and imaging studies  CBC:  Recent Labs Lab 02/21/16 0620 02/23/16 0348  WBC 7.4 9.9  HGB 13.8 15.3  HCT 47.2 51.5  MCV 99.4 96.8  PLT 196 242   Basic Metabolic Panel:  Recent Labs Lab 02/21/16 0620 02/22/16 0345 02/23/16 0348 02/24/16 0441  NA 142 141 138 135  K 3.9 3.5 3.4* 3.3*  CL 101 95* 88* 87*  CO2 34* 38* 40* 37*    GLUCOSE 135* 111* 105* 119*  BUN 17 25* 40* 57*  CREATININE 1.20 1.09 1.54* 1.79*  CALCIUM 8.5* 8.7* 9.6 9.1  MG  --   --  2.0  --    GFR: Estimated Creatinine Clearance: 38.6 mL/min (by C-G formula based on SCr of 1.79 mg/dL (H)). Liver Function Tests:  Recent Labs Lab 02/21/16 0620  AST 39  ALT 34  ALKPHOS 57  BILITOT 0.8  PROT 7.1  ALBUMIN 3.1*   No results for input(s): LIPASE, AMYLASE in the last 168 hours. No results for input(s): AMMONIA in the last 168 hours. Coagulation Profile: No results for input(s): INR, PROTIME in the last 168 hours. Cardiac Enzymes: No results for input(s): CKTOTAL, CKMB, CKMBINDEX, TROPONINI in the last 168 hours. BNP (last 3 results) No results for input(s): PROBNP in the last 8760 hours. HbA1C: No results for input(s): HGBA1C in the last 72 hours. CBG: No results for input(s): GLUCAP in the last 168 hours. Lipid Profile: No results for input(s): CHOL, HDL, LDLCALC, TRIG, CHOLHDL, LDLDIRECT in the last 72 hours. Thyroid Function Tests: No results for input(s): TSH, T4TOTAL, FREET4, T3FREE, THYROIDAB in the last 72 hours. Anemia Panel: No results for input(s): VITAMINB12, FOLATE, FERRITIN, TIBC, IRON, RETICCTPCT in the last  72 hours. Sepsis Labs: No results for input(s): PROCALCITON, LATICACIDVEN in the last 168 hours.  Recent Results (from the past 240 hour(s))  MRSA PCR Screening     Status: None   Collection Time: 02/21/16 12:09 PM  Result Value Ref Range Status   MRSA by PCR NEGATIVE NEGATIVE Final    Comment:        The GeneXpert MRSA Assay (FDA approved for NASAL specimens only), is one component of a comprehensive MRSA colonization surveillance program. It is not intended to diagnose MRSA infection nor to guide or monitor treatment for MRSA infections.          Radiology Studies: No results found.      Scheduled Meds: . carvedilol  3.125 mg Oral BID WC  . chlorhexidine  15 mL Mouth Rinse BID  .  enoxaparin (LOVENOX) injection  40 mg Subcutaneous Q24H  . furosemide  40 mg Oral BID  . hydrALAZINE  25 mg Oral Q8H  . mouth rinse  15 mL Mouth Rinse q12n4p  . potassium chloride  20 mEq Oral BID  . sodium chloride flush  3 mL Intravenous Q12H   Continuous Infusions:    LOS: 3 days    Time spent: 95 mins    Hanah Moultry, MD [facility] Pager [phone]  If 7PM-7AM, please contact night-coverage [email or web address] Password [email or web address] 02/24/2016, 3:06 PM

## 2016-02-24 NOTE — Care Management Note (Signed)
Case Management Note  Patient Details  Name: Jerry Edwards MRN: [id number] Date of Birth: Nov 10, 1970  Subjective/Objective:45 y/o m admitted w/Acute resp failure. From home. Has home 02.                    Action/Plan:d/c plan home.   Expected Discharge Date:   (unknown)               Expected Discharge Plan:  Home/Self Care  In-House Referral:     Discharge planning Services  CM Consult  Post Acute Care Choice:    Choice offered to:     DME Arranged:    DME Agency:     HH Arranged:    HH Agency:     Status of Service:  In process, will continue to follow  If discussed at Long Length of Stay Meetings, dates discussed:    Additional Comments:  Dessa Phi, RN 02/24/2016, 2:27 PM

## 2016-02-25 DIAGNOSIS — E872 Acidosis: Secondary | ICD-10-CM

## 2016-02-25 DIAGNOSIS — E8729 Other acidosis: Secondary | ICD-10-CM

## 2016-02-25 LAB — BASIC METABOLIC PANEL
Anion gap: 12 (ref 5–15)
BUN: 68 mg/dL — AB (ref 6–20)
CHLORIDE: 92 mmol/L — AB (ref 101–111)
CO2: 35 mmol/L — AB (ref 22–32)
Calcium: 9.1 mg/dL (ref 8.9–10.3)
Creatinine, Ser: 2.17 mg/dL — ABNORMAL HIGH (ref 0.61–1.24)
GFR calc Af Amer: 41 mL/min — ABNORMAL LOW (ref >60)
GFR calc non Af Amer: 35 mL/min — ABNORMAL LOW (ref >60)
GLUCOSE: 112 mg/dL — AB (ref 65–99)
POTASSIUM: 3.6 mmol/L (ref 3.5–5.1)
Sodium: 139 mmol/L (ref 135–145)

## 2016-02-25 NOTE — Consult Note (Signed)
   [facility] CM Inpatient Consult   02/25/2016  Jerry Edwards Mar 20, 1971 [id number]    [facility] Care Management follow up. Spoke with Jerry Edwards at bedside. Discussed EMMI follow up calls for CHF management. Jerry Edwards is agreeable and literature provided along with 24-hr nurse line magnet, and contact information. Jerry Edwards denies having any discharge transportation or medication needs. Confirmed best contact number as [phone].  Made inpatient RNCM aware. Also will refer to [facility] office for EMMI calls for CHF.    Marthenia Rolling, MSN-Ed, RN,BSN [facility] Liaison [phone]

## 2016-02-25 NOTE — Progress Notes (Signed)
Patient ID: Jerry Edwards, male   DOB: 04-Apr-1970, 45 y.o.   MRN: [id number]   Subjective: Feeling better Has CPAP on   Objective: Vitals:   02/24/16 1451 02/24/16 1710 02/24/16 2133 02/25/16 0533  BP: 108/60 112/72 120/69 (!) 106/55  Pulse: 91 83 88 80  Resp: 18   20  Temp: 98.3 F (36.8 C)  98.9 F (37.2 C) 97.7 F (36.5 C)  TempSrc: Oral  Oral Oral  SpO2: 96%  94% 98%  Weight:    96.8 kg (213 lb 6.5 oz)  Height:       Weight change: -0.7 kg (-1 lb 8.7 oz)  Intake/Output Summary (Last 24 hours) at 02/25/16 1047 Last data filed at 02/25/16 1638  Gross per 24 hour  Intake              600 ml  Output              800 ml  Net             -200 ml   Net neg 5.4 L  Affect appropriate Back male osteogenic imperfecta  HEENT: normal Neck supple with no adenopathy JVP normal no bruits no thyromegaly Lungs clear with no wheezing and good diaphragmatic motion Heart:  S1/S2 no murmur, no rub, gallop or click PMI normal Abdomen: benighn, BS positve, no tenderness, no AAA GB scar  no bruit.  No HSM or HJR Distal pulses intact with no bruits Compression device on  Neuro non-focal Skin warm and dry Post bilateral knee surgeries    Tel  SR   Lab Results: Results for orders placed or performed during the hospital encounter of 02/21/16 (from the past 24 hour(s))  Basic metabolic panel     Status: Abnormal   Collection Time: 02/25/16  5:37 AM  Result Value Ref Range   Sodium 139 135 - 145 mmol/L   Potassium 3.6 3.5 - 5.1 mmol/L   Chloride 92 (L) 101 - 111 mmol/L   CO2 35 (H) 22 - 32 mmol/L   Glucose, Bld 112 (H) 65 - 99 mg/dL   BUN 68 (H) 6 - 20 mg/dL   Creatinine, Ser 2.17 (H) 0.61 - 1.24 mg/dL   Calcium 9.1 8.9 - 10.3 mg/dL   GFR calc non Af Amer 35 (L) >60 mL/min   GFR calc Af Amer 41 (L) >60 mL/min   Anion gap 12 5 - 15    Studies/Results: No results found.  Medications:   Current Facility-Administered Medications:  .  0.9 %  sodium chloride infusion, 250 mL,  Intravenous, PRN, Mariel Aloe, MD, Stopped at 02/23/16 1721 .  acetaminophen (TYLENOL) tablet 650 mg, 650 mg, Oral, Q4H PRN, Mariel Aloe, MD .  carvedilol (COREG) tablet 3.125 mg, 3.125 mg, Oral, BID WC, Fay Records, MD, 3.125 mg at 02/24/16 1711 .  chlorhexidine (PERIDEX) 0.12 % solution 15 mL, 15 mL, Mouth Rinse, BID, Mariel Aloe, MD, 15 mL at 02/24/16 2143 .  enoxaparin (LOVENOX) injection 40 mg, 40 mg, Subcutaneous, Q24H, Mariel Aloe, MD, 40 mg at 02/24/16 2143 .  hydrALAZINE (APRESOLINE) tablet 25 mg, 25 mg, Oral, Q8H, Jettie Booze, MD, 25 mg at 02/25/16 [phone] .  MEDLINE mouth rinse, 15 mL, Mouth Rinse, q12n4p, Mariel Aloe, MD, 15 mL at 02/23/16 1718 .  ondansetron (ZOFRAN) injection 4 mg, 4 mg, Intravenous, Q6H PRN, Mariel Aloe, MD .  potassium chloride SA (K-DUR,KLOR-CON) CR tablet 20 mEq, 20 mEq, Oral,  BID, Mariel Aloe, MD, 20 mEq at 02/24/16 2143 .  sodium chloride flush (NS) 0.9 % injection 3 mL, 3 mL, Intravenous, Q12H, Mariel Aloe, MD, 3 mL at 02/24/16 2143 .  sodium chloride flush (NS) 0.9 % injection 3 mL, 3 mL, Intravenous, PRN, Mariel Aloe, MD  1  Acute on chornic systolic CHF improved Cr over 2 now dry have held lasix Still on Kdur as K below 4 Seems ready for dc If Cr less than 2 can d/c am on normal home dose of lasix/Kdur  2  HTN   BP  Improved   3  OSA  Using CPAP     LOS: 4 days   Jenkins Rouge 02/25/2016, 10:47 AM Patient ID: Jerry Edwards, male   DOB: [birth year], 45 y.o.   MRN: [id number]

## 2016-02-25 NOTE — Care Management Important Message (Signed)
Important Message  Patient Details  Name: Naftuli Dalsanto MRN: [id number] Date of Birth: Nov 30, 1970   Medicare Important Message Given:  Yes    Camillo Flaming 02/25/2016, 10:05 AMImportant Message  Patient Details  Name: Alvaro Aungst MRN: [id number] Date of Birth: 08-21-1970   Medicare Important Message Given:  Yes    Camillo Flaming 02/25/2016, 10:05 AM

## 2016-02-25 NOTE — Progress Notes (Signed)
PROGRESS NOTE    Jerry Edwards  DPO:[id number] DOB: 1971-01-14 DOA: 02/21/2016 PCP: [name], MD    Brief Narrative:  Jerry Edwards is a 45 y.o. 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 Last night patient reports waking up from sleep secondary to dyspnea. This episode scared him, and EMS was called for transfer to the emergency department. He has never had a CHF exacerbation from his report. He sleeps on 2 pillows.  ED Course: Vitals: Afebrile. Pulse high 90s to low 100s. Respirations and 20s. Blood pressure elevated. In the 90%'s O2 on BiPAP Labs: Creatinine 1.20. Glucose 135. Albumin 3.1. BNP 172.3. I-STAT troponin 0.06. ABG significant for pH of 7.309, PCO2 of 72.6 while on BiPAP. Imaging: Chest x-ray concerning for interstitial pulmonary edema Medications/Course: Lasix 80 mg 1 and nitroglycerin drip started    Assessment & Plan:   Principal Problem:   Acute respiratory failure with hypoxia and hypercapnia (HCC) Active Problems:   Acute exacerbation of CHF (congestive heart failure) (HCC)   Hypertensive emergency   Hypertensive heart disease   Osteogenesis imperfecta   NICM (nonischemic cardiomyopathy) ([place])   AKI (acute kidney injury) ([place])   #1 acute respiratory failure with hypoxia and hypercapnia secondary to acute systolic CHF exacerbation/nonischemic cardiomyopathy Patient had presented with worsening shortness of breath chest x-ray consistent with pulmonary edema, lower extremity edema requiring BiPAP. BNP was 172.3. Likely secondary to dietary indiscretions. Point-of-care troponin was 0.06. Patient denied any chest pain. Concern for possible dietary indiscretion. Patient with clinical improvement on BiPAP. Patient with a urine output of 0.800 L over the past 24  hours and - 6.619 L during the hospitalization. Current weight is 213 lbs form 214 pounds from 222 pounds. Weight at cardiology's office in March 2017 was 191 pounds. Dry weight appears to be 190-195lbs. Lasix dose was increased per cardiology and was at Lasix 120 mg IV every 12 hours initially on admission. Bump in creatinine and as such IV Lasix has been discontinued. Nitroglycerin drip has been weaned off. 2-D echo with a EF of 40% with diffuse hypokinesis and grade 1 diastolic dysfunction. EF has improved from prior 2-D echo. Continue hydralazine, coreg. Discontinued lisinopril, Zaroxolyn due to increasing creatinine. Lasix on hold. Per cardiology once creatinine is less than 2 could probably discharge home on home regimen of Lasix and potassium. Patient needs to follow-up with cardiology in the outpatient setting. Patient back on home O2 at 4 L.  Dietitian has been consulted. Cardiology following and appreciate input and recommendations.  #2 hypertensive emergency Patient noted on admission to be in hypertensive emergency with blood pressure as high as 198/132. Blood pressure improved on nitro drip, hydralazine, Lasix, Zaroxolyn, lisinopril. Nitroglycerin drip has been currently weaned off. Continue hydralazine, Coreg. Discontinued lisinopril and Zaroxolyn secondary to increasing creatinine. Oral Lasix on hold. Cardiology following and appreciate input and recommendations.  #3 osteogenesis imperfecta  #4 elevated creatinine/acute kidney injury Likely secondary to aggressive diuresis. Renal function continues to trend upward and creatinine currently at 2.17 from 1.79 from 1.09 on 02/22/2016. IV Lasix were discontinued on 02/23/2016. Patient received a dose of Zaroxolyn 02/24/2016. Discontinued Zaroxolyn and will not resume on discharge. Discontinued ACE inhibitor. Oral diuretics on hold. Per cardiology once creatinine is less than 2) possibly discharge home with home dose of Lasix and potassium.    DVT  prophylaxis: Lovenox  Code Status: Full Family Communication: Updated patient. No family at bedside. Disposition Plan: Home when medically stable and clinically improved and back to baseline, and when renal function starts to trend back down and  < 2 per cardiology rxcs.    Consultants:   Cardiology: Dr. Irish Lack 02/21/2016  Procedures:   Chest x-ray 02/21/2016  2-D echo 02/22/2016  Antimicrobials:  None   Subjective: Patient denies SOB. No CP. Feeling better. No chest pain.  Objective: Vitals:   02/24/16 1710 02/24/16 2133 02/25/16 0533 02/25/16 1557  BP: 112/72 120/69 (!) 106/55 111/60  Pulse: 83 88 80 81  Resp:   20 19  Temp:  98.9 F (37.2 C) 97.7 F (36.5 C) 98.4 F (36.9 C)  TempSrc:  Oral Oral Oral  SpO2:  94% 98% 96%  Weight:   96.8 kg (213 lb 6.5 oz)   Height:        Intake/Output Summary (Last 24 hours) at 02/25/16 1712 Last data filed at 02/25/16 1558  Gross per 24 hour  Intake              600 ml  Output              850 ml  Net             -250 ml   Filed Weights   02/23/16 0500 02/24/16 0549 02/25/16 0533  Weight: 100.8 kg (222 lb 3.6 oz) 97.5 kg (214 lb 15.2 oz) 96.8 kg (213 lb 6.5 oz)    Examination:  General exam: Appears calm and comfortable. On CPAP Respiratory system: CTAB. No wheezing.  Cardiovascular system: S1 & S2 heard, RRR. No JVD, murmurs, rubs, gallops or clicks. Trace lower extremity edema. Gastrointestinal system: Abdomen is nondistended, soft and nontender. No organomegaly or masses felt. Normal bowel sounds heard. Central nervous system: Alert and oriented. No focal neurological deficits. Extremities: Symmetric 5 x 5 power. Skin: No rashes, lesions or ulcers Psychiatry: Judgement and insight appear normal. Mood & affect appropriate.     Data Reviewed: I have personally reviewed following labs and imaging studies  CBC:  Recent Labs Lab 02/21/16 0620 02/23/16 0348  WBC 7.4 9.9  HGB 13.8 15.3  HCT 47.2 51.5  MCV  99.4 96.8  PLT 196 086   Basic Metabolic Panel:  Recent Labs Lab 02/21/16 0620 02/22/16 0345 02/23/16 0348 02/24/16 0441 02/25/16 0537  NA 142 141 138 135 139  K 3.9 3.5 3.4* 3.3* 3.6  CL 101 95* 88* 87* 92*  CO2 34* 38* 40* 37* 35*  GLUCOSE 135* 111* 105* 119* 112*  BUN 17 25* 40* 57* 68*  CREATININE 1.20 1.09 1.54* 1.79* 2.17*  CALCIUM 8.5* 8.7* 9.6 9.1 9.1  MG  --   --  2.0  --   --    GFR: Estimated Creatinine Clearance: 31.7 mL/min (by C-G formula based on SCr of 2.17 mg/dL (H)). Liver Function Tests:  Recent Labs Lab 02/21/16 0620  AST 39  ALT 34  ALKPHOS 57  BILITOT 0.8  PROT 7.1  ALBUMIN 3.1*   No results for input(s): LIPASE, AMYLASE in the last 168 hours. No results for input(s): AMMONIA in the last 168 hours. Coagulation Profile: No results for input(s): INR, PROTIME in the last 168 hours. Cardiac Enzymes: No results for input(s): CKTOTAL, CKMB, CKMBINDEX, TROPONINI in the last 168 hours. BNP (last 3 results) No results for input(s): PROBNP in the last 8760 hours. HbA1C: No results for input(s): HGBA1C in the last 72  hours. CBG: No results for input(s): GLUCAP in the last 168 hours. Lipid Profile: No results for input(s): CHOL, HDL, LDLCALC, TRIG, CHOLHDL, LDLDIRECT in the last 72 hours. Thyroid Function Tests: No results for input(s): TSH, T4TOTAL, FREET4, T3FREE, THYROIDAB in the last 72 hours. Anemia Panel: No results for input(s): VITAMINB12, FOLATE, FERRITIN, TIBC, IRON, RETICCTPCT in the last 72 hours. Sepsis Labs: No results for input(s): PROCALCITON, LATICACIDVEN in the last 168 hours.  Recent Results (from the past 240 hour(s))  MRSA PCR Screening     Status: None   Collection Time: 02/21/16 12:09 PM  Result Value Ref Range Status   MRSA by PCR NEGATIVE NEGATIVE Final    Comment:        The GeneXpert MRSA Assay (FDA approved for NASAL specimens only), is one component of a comprehensive MRSA colonization surveillance program. It  is not intended to diagnose MRSA infection nor to guide or monitor treatment for MRSA infections.          Radiology Studies: No results found.      Scheduled Meds: . carvedilol  3.125 mg Oral BID WC  . chlorhexidine  15 mL Mouth Rinse BID  . enoxaparin (LOVENOX) injection  40 mg Subcutaneous Q24H  . hydrALAZINE  25 mg Oral Q8H  . mouth rinse  15 mL Mouth Rinse q12n4p  . potassium chloride  20 mEq Oral BID  . sodium chloride flush  3 mL Intravenous Q12H   Continuous Infusions:    LOS: 4 days    Time spent: 58 mins    Kimmi Acocella, MD [facility] Pager [phone]  If 7PM-7AM, please contact night-coverage [email or web address] Password [email or web address] 02/25/2016, 5:12 PM

## 2016-02-25 NOTE — Consult Note (Signed)
   [facility] CM Inpatient Consult   02/25/2016  Yon Schiffman [birth year] [id number]    Screened patient for potential [facility] Care Management services. Went to bedside to speak with him. However, Mr. Skoczylas was sleeping soundly on bipap machine. Left [facility] Care Management brochure at bedside. Spoke with his nurse as well. Will come back at later time. Will make inpatient RNCM aware of writer's visit.    Marthenia Rolling, MSN-Ed, RN,BSN [facility] Liaison [phone]

## 2016-02-26 DIAGNOSIS — I110 Hypertensive heart disease with heart failure: Principal | ICD-10-CM

## 2016-02-26 DIAGNOSIS — N179 Acute kidney failure, unspecified: Secondary | ICD-10-CM

## 2016-02-26 LAB — BASIC METABOLIC PANEL
ANION GAP: 10 (ref 5–15)
BUN: 61 mg/dL — ABNORMAL HIGH (ref 6–20)
CALCIUM: 8.8 mg/dL — AB (ref 8.9–10.3)
CO2: 35 mmol/L — ABNORMAL HIGH (ref 22–32)
CREATININE: 1.5 mg/dL — AB (ref 0.61–1.24)
Chloride: 94 mmol/L — ABNORMAL LOW (ref 101–111)
GFR, EST NON AFRICAN AMERICAN: 55 mL/min — AB (ref >60)
Glucose, Bld: 94 mg/dL (ref 65–99)
Potassium: 4 mmol/L (ref 3.5–5.1)
SODIUM: 139 mmol/L (ref 135–145)

## 2016-02-26 LAB — CBC
HCT: 47 % (ref 39.0–52.0)
HEMOGLOBIN: 14.8 g/dL (ref 13.0–17.0)
MCH: 29.3 pg (ref 26.0–34.0)
MCHC: 31.5 g/dL (ref 30.0–36.0)
MCV: 93.1 fL (ref 78.0–100.0)
PLATELETS: 232 10*3/uL (ref 150–400)
RBC: 5.05 MIL/uL (ref 4.22–5.81)
RDW: 12.5 % (ref 11.5–15.5)
WBC: 7.7 10*3/uL (ref 4.0–10.5)

## 2016-02-26 MED ORDER — FUROSEMIDE 40 MG PO TABS
40.0000 mg | ORAL_TABLET | Freq: Every day | ORAL | Status: DC
  Administered 2016-02-26 – 2016-02-27 (×2): 40 mg via ORAL
  Filled 2016-02-26 (×2): qty 1

## 2016-02-26 NOTE — Progress Notes (Signed)
[facility] PROGRESS NOTE    Progress Note  Jerry Edwards  UYQ:[id number] DOB: 17-Nov-1970 DOA: 02/21/2016 PCP: [name], MD     Brief Narrative:   Jerry Edwards is an 45 y.o. male past medical history of osteogenic imperfecta, essential hypertension, is nonischemic cardiomyopathy with an EF of 20%, with a cardiac MRI on 04/15/2015 that demonstrated mild LV dysfunction with global hypokinesia. Comes in with acute hypoxic respiratory failure due to hypoxia and hypercarbia likely due to acute decompensated systolic heart failure  Assessment/Plan:  Acute respiratory failure with hypoxia and hypercapnia ([place]): Chest x-ray on admission was consistent with pulmonary edema, his BNP was 172 likely due to dietary indiscretion. Estimated dry weight is around 190-95 kg. Cardiology was consulted recommended 120 mg of IV Lasix every 12 hours. There was a moderate-sized and his creatinine so Lasix was held. 2-D echo showed an EF of 40% with diffuse hypokinesia. We'll discharge him home on Diovan which could be followed up by the heart failure clinic and change to interest him as an outpatient. We'll continue to wean him off oxygen.  Hypertensive emergency/Hypertensive heart disease: His blood pressure improved with a nitro drip, hydralazine, Lasix  lisinopril. Currently his nitroglycerin has been weaned off.  Osteogenesis imperfecta  AKI (acute kidney injury) ([place]) Likely secondary to overdiuresis. Baseline creatinine is around 1.   his creatinine is slowly trending down to baseline.  Hypokalemia: Likely due to diuresis. Now improved. Continue supplementations.   DVT prophylaxis: lovenox Family Communication:None Disposition Plan/Barrier to D/C: home  In am Code Status:     Code Status Orders        Start     Ordered   02/21/16 1213  Full code  Continuous     02/21/16 1212    Code Status History    Date Active Date Inactive Code Status Order ID Comments User Context     03/28/2015  6:15 PM 04/02/2015  3:19 PM Full Code [id number]  Theodis Blaze, MD ED   04/08/2013  6:25 AM 04/12/2013  5:46 PM Full Code [id number]  Berle Mull, MD Inpatient   05/06/2012  4:12 PM 05/09/2012 12:47 AM Full Code [id number]  Louellen Molder, MD Inpatient   02/23/2012 11:39 PM 02/27/2012  8:16 PM Full Code [id number]  Cherlynn June, RN Inpatient        IV Access:    Peripheral IV   Procedures and diagnostic studies:   No results found.   Medical Consultants:    None.  Anti-Infectives:   none  Subjective:    Rexene Agent he relates his breathing is a lot better. Is able to sleep flat.  Objective:    Vitals:   02/25/16 2114 02/26/16 0500 02/26/16 0604 02/26/16 0708  BP: 110/62  108/65   Pulse: 83  84   Resp: 18  18   Temp: 98.4 F (36.9 C)  98 F (36.7 C)   TempSrc: Oral  Oral   SpO2: 93%  92%   Weight:  95.5 kg (210 lb 8.6 oz)  96.3 kg (212 lb 4.9 oz)  Height:        Intake/Output Summary (Last 24 hours) at 02/26/16 0932 Last data filed at 02/26/16 0604  Gross per 24 hour  Intake              483 ml  Output              900 ml  Net             -  417 ml   Filed Weights   02/25/16 0533 02/26/16 0500 02/26/16 0708  Weight: 96.8 kg (213 lb 6.5 oz) 95.5 kg (210 lb 8.6 oz) 96.3 kg (212 lb 4.9 oz)    Exam: General exam: In no acute distress. Respiratory system: Good air movement and clear to auscultation. Cardiovascular system: S1 & S2 heard, RRR. - JVD. Gastrointestinal system: Abdomen is nondistended, soft and nontender.  Extremities: No pedal edema. Skin: No rashes, lesions or ulcers Psychiatry: Judgement and insight appear normal. Mood & affect appropriate.    Data Reviewed:    Labs: Basic Metabolic Panel:  Recent Labs Lab 02/22/16 0345 02/23/16 0348 02/24/16 0441 02/25/16 0537 02/26/16 0447  NA 141 138 135 139 139  K 3.5 3.4* 3.3* 3.6 4.0  CL 95* 88* 87* 92* 94*  CO2 38* 40* 37* 35* 35*  GLUCOSE 111* 105* 119* 112* 94  BUN 25*  40* 57* 68* 61*  CREATININE 1.09 1.54* 1.79* 2.17* 1.50*  CALCIUM 8.7* 9.6 9.1 9.1 8.8*  MG  --  2.0  --   --   --    GFR Estimated Creatinine Clearance: 45.7 mL/min (by C-G formula based on SCr of 1.5 mg/dL (H)). Liver Function Tests:  Recent Labs Lab 02/21/16 0620  AST 39  ALT 34  ALKPHOS 57  BILITOT 0.8  PROT 7.1  ALBUMIN 3.1*   No results for input(s): LIPASE, AMYLASE in the last 168 hours. No results for input(s): AMMONIA in the last 168 hours. Coagulation profile No results for input(s): INR, PROTIME in the last 168 hours.  CBC:  Recent Labs Lab 02/21/16 0620 02/23/16 0348 02/26/16 0447  WBC 7.4 9.9 7.7  HGB 13.8 15.3 14.8  HCT 47.2 51.5 47.0  MCV 99.4 96.8 93.1  PLT 196 235 232   Cardiac Enzymes: No results for input(s): CKTOTAL, CKMB, CKMBINDEX, TROPONINI in the last 168 hours. BNP (last 3 results) No results for input(s): PROBNP in the last 8760 hours. CBG: No results for input(s): GLUCAP in the last 168 hours. D-Dimer: No results for input(s): DDIMER in the last 72 hours. Hgb A1c: No results for input(s): HGBA1C in the last 72 hours. Lipid Profile: No results for input(s): CHOL, HDL, LDLCALC, TRIG, CHOLHDL, LDLDIRECT in the last 72 hours. Thyroid function studies: No results for input(s): TSH, T4TOTAL, T3FREE, THYROIDAB in the last 72 hours.  Invalid input(s): FREET3 Anemia work up: No results for input(s): VITAMINB12, FOLATE, FERRITIN, TIBC, IRON, RETICCTPCT in the last 72 hours. Sepsis Labs:  Recent Labs Lab 02/21/16 0620 02/23/16 0348 02/26/16 0447  WBC 7.4 9.9 7.7   Microbiology Recent Results (from the past 240 hour(s))  MRSA PCR Screening     Status: None   Collection Time: 02/21/16 12:09 PM  Result Value Ref Range Status   MRSA by PCR NEGATIVE NEGATIVE Final    Comment:        The GeneXpert MRSA Assay (FDA approved for NASAL specimens only), is one component of a comprehensive MRSA colonization surveillance program. It is  not intended to diagnose MRSA infection nor to guide or monitor treatment for MRSA infections.      Medications:   . carvedilol  3.125 mg Oral BID WC  . chlorhexidine  15 mL Mouth Rinse BID  . enoxaparin (LOVENOX) injection  40 mg Subcutaneous Q24H  . hydrALAZINE  25 mg Oral Q8H  . mouth rinse  15 mL Mouth Rinse q12n4p  . potassium chloride  20 mEq Oral BID  . sodium chloride  flush  3 mL Intravenous Q12H   Continuous Infusions:  Time spent: 15 min   LOS: 5 days   Charlynne Cousins  [facility] Pager [phone]  *Please refer to [email or web address], password [email or web address] to get updated schedule on who will round on this patient, as hospitalists switch teams weekly. If 7PM-7AM, please contact night-coverage at [email or web address], password [email or web address] for any overnight needs.  02/26/2016, 9:32 AM

## 2016-02-26 NOTE — Progress Notes (Signed)
Pt states he is not ready for his CPAP yet and will have his RN contact RT when he is ready.

## 2016-02-26 NOTE — Care Management Note (Signed)
Case Management Note  Patient Details  Name: Jerry Edwards MRN: [id number] Date of Birth: 1970-10-17  Subjective/Objective: Patient agreed to [place] chosen-rep Manuela Schwartz aware of [place] orders, & may d/c in am. Provided w/[facility] program info, & referral-patient agreed to service.[facility] dme rep aware of home 02 concerns for portables-to be checked @ home. Benefit for entresto checked-previous check for entresto on March 28,2017-pharmacy-no script coverage-cost $462.09. Patient only has Medicare part A,& B coverage-no script coverage. Informed patient of [facility] med list-provided it to patient-he wants reasonable meds from $4 med list. Would recc-Advanced Heart failure program-will call for a screening.  Action/Plan:   Expected Discharge Date:   (unknown)               Expected Discharge Plan:  [place]  In-House Referral:     Discharge planning Services  CM Consult  Post Acute Care Choice:  Durable Medical Equipment [facility] home 02, has w/c) Choice offered to:  Patient  DME Arranged:    DME Agency:     HH Arranged:  RN [place] Agency:  [place]  Status of Service:  In process, will continue to follow  If discussed at Long Length of Stay Meetings, dates discussed:    Additional Comments:  Dessa Phi, RN 02/26/2016, 1:29 PM

## 2016-02-26 NOTE — Progress Notes (Signed)
Patient ID: Jerry Edwards, male   DOB: 1970-06-15, 45 y.o.   MRN: [id number]   Subjective: Feels good CPAP   Objective: Vitals:   02/25/16 2114 02/26/16 0500 02/26/16 0604 02/26/16 0708  BP: 110/62  108/65   Pulse: 83  84   Resp: 18  18   Temp: 98.4 F (36.9 C)  98 F (36.7 C)   TempSrc: Oral  Oral   SpO2: 93%  92%   Weight:  210 lb 8.6 oz (95.5 kg)  212 lb 4.9 oz (96.3 kg)  Height:       Weight change: -2 lb 13.9 oz (-1.3 kg)  Intake/Output Summary (Last 24 hours) at 02/26/16 1000 Last data filed at 02/26/16 0604  Gross per 24 hour  Intake              483 ml  Output              600 ml  Net             -117 ml   Net neg 5.4 L  Affect appropriate Back male osteogenic imperfecta  HEENT: normal Neck supple with no adenopathy JVP normal no bruits no thyromegaly Lungs clear with no wheezing and good diaphragmatic motion Heart:  S1/S2 no murmur, no rub, gallop or click PMI normal Abdomen: benighn, BS positve, no tenderness, no AAA GB scar  no bruit.  No HSM or HJR Distal pulses intact with no bruits Compression device on  Neuro non-focal Skin warm and dry Post bilateral knee surgeries    Tel  SR   Lab Results: Results for orders placed or performed during the hospital encounter of 02/21/16 (from the past 24 hour(s))  Basic metabolic panel     Status: Abnormal   Collection Time: 02/26/16  4:47 AM  Result Value Ref Range   Sodium 139 135 - 145 mmol/L   Potassium 4.0 3.5 - 5.1 mmol/L   Chloride 94 (L) 101 - 111 mmol/L   CO2 35 (H) 22 - 32 mmol/L   Glucose, Bld 94 65 - 99 mg/dL   BUN 61 (H) 6 - 20 mg/dL   Creatinine, Ser 1.50 (H) 0.61 - 1.24 mg/dL   Calcium 8.8 (L) 8.9 - 10.3 mg/dL   GFR calc non Af Amer 55 (L) >60 mL/min   GFR calc Af Amer >60 >60 mL/min   Anion gap 10 5 - 15  CBC     Status: None   Collection Time: 02/26/16  4:47 AM  Result Value Ref Range   WBC 7.7 4.0 - 10.5 K/uL   RBC 5.05 4.22 - 5.81 MIL/uL   Hemoglobin 14.8 13.0 - 17.0 g/dL   HCT  47.0 39.0 - 52.0 %   MCV 93.1 78.0 - 100.0 fL   MCH 29.3 26.0 - 34.0 pg   MCHC 31.5 30.0 - 36.0 g/dL   RDW 12.5 11.5 - 15.5 %   Platelets 232 150 - 400 K/uL    Studies/Results: No results found.  Medications:   Current Facility-Administered Medications:  .  0.9 %  sodium chloride infusion, 250 mL, Intravenous, PRN, Mariel Aloe, MD, Stopped at 02/23/16 1721 .  acetaminophen (TYLENOL) tablet 650 mg, 650 mg, Oral, Q4H PRN, Mariel Aloe, MD .  carvedilol (COREG) tablet 3.125 mg, 3.125 mg, Oral, BID WC, Fay Records, MD, 3.125 mg at 02/26/16 0951 .  chlorhexidine (PERIDEX) 0.12 % solution 15 mL, 15 mL, Mouth Rinse, BID, Mariel Aloe, MD, 15 mL  at 02/26/16 0950 .  enoxaparin (LOVENOX) injection 40 mg, 40 mg, Subcutaneous, Q24H, Mariel Aloe, MD, 40 mg at 02/25/16 2131 .  hydrALAZINE (APRESOLINE) tablet 25 mg, 25 mg, Oral, Q8H, Jettie Booze, MD, 25 mg at 02/26/16 0617 .  MEDLINE mouth rinse, 15 mL, Mouth Rinse, q12n4p, Mariel Aloe, MD, 15 mL at 02/23/16 1718 .  ondansetron (ZOFRAN) injection 4 mg, 4 mg, Intravenous, Q6H PRN, Mariel Aloe, MD .  potassium chloride SA (K-DUR,KLOR-CON) CR tablet 20 mEq, 20 mEq, Oral, BID, Mariel Aloe, MD, 20 mEq at 02/26/16 0951 .  sodium chloride flush (NS) 0.9 % injection 3 mL, 3 mL, Intravenous, Q12H, Mariel Aloe, MD, 3 mL at 02/25/16 2132 .  sodium chloride flush (NS) 0.9 % injection 3 mL, 3 mL, Intravenous, PRN, Mariel Aloe, MD  1  Acute on chornic systolic CHF improved overdiuresed Cr better this am resume normal home dose lasix in am Seems ready for d/c  Cr better 1.5 today   2  HTN   BP  Improved   3  OSA  Using CPAP    Will sign off    LOS: 5 days   Jenkins Rouge 02/26/2016, 10:00 AM Patient ID: Rexene Agent, male   DOB: October 05, 1970, 45 y.o.   MRN: [id number]

## 2016-02-27 LAB — BASIC METABOLIC PANEL
ANION GAP: 8 (ref 5–15)
BUN: 42 mg/dL — ABNORMAL HIGH (ref 6–20)
CALCIUM: 9.1 mg/dL (ref 8.9–10.3)
CO2: 33 mmol/L — ABNORMAL HIGH (ref 22–32)
Chloride: 97 mmol/L — ABNORMAL LOW (ref 101–111)
Creatinine, Ser: 1.21 mg/dL (ref 0.61–1.24)
Glucose, Bld: 100 mg/dL — ABNORMAL HIGH (ref 65–99)
Potassium: 4 mmol/L (ref 3.5–5.1)
Sodium: 138 mmol/L (ref 135–145)

## 2016-02-27 MED ORDER — CARVEDILOL 3.125 MG PO TABS: 3.1250 mg | ORAL_TABLET | Freq: Two times a day (BID) | ORAL | 0 refills | Status: DC

## 2016-02-27 MED ORDER — VALSARTAN 160 MG PO TABS: 160.0000 mg | ORAL_TABLET | Freq: Every day | ORAL | 0 refills | Status: DC

## 2016-02-27 NOTE — Discharge Summary (Addendum)
Physician Discharge Summary  Jerry Edwards PIR:[id number] DOB: 05-09-1970 DOA: 02/21/2016  PCP: [name], MD  Admit date: 02/21/2016 Discharge date: 02/29/2016  Admitted From: home Disposition:  home  Recommendations for Outpatient Follow-up:  1. Follow up with Cardiology in 1-2 weeks 2. Please obtain BMP/CBC in one week 3. We'll need to discuss with patient compliance, as well as clearly DC'd several medications on a and his blood pressure seems to be improving on a daily basis.  Home Health:[facility] Equipment/Devices:none  Discharge Condition:stable CODE STATUS:full Diet recommendation: Heart Healthy  Brief/Interim Summary: 45 year old with past medical history osteogenesis imperfecta and nonischemic cardiomyopathy with an EF of 25% comes in for shortness of breath for several days.  Discharge Diagnoses:  Principal Problem:   Acute respiratory failure with hypoxia and hypercapnia (HCC) Active Problems:   Hypertensive emergency   Hypertensive heart disease   Acute exacerbation of CHF (congestive heart failure) (HCC)   Osteogenesis imperfecta   NICM (nonischemic cardiomyopathy) ([place])   AKI (acute kidney injury) ([place])   Respiratory acidosis   Obesity hypoventilation syndrome ([place])   Obstructive sleep apnea  Acute respiratory failure with hypoxia and hypercapnia ([place]): Chest x-ray on admission was consistent with pulmonary edema, his BNP was 172 likely due to dietary indiscretion and noncompliance with his medication.In the setting of obesity hypoventilation syndrome and obstructive sleep apnea Estimated lowest weight in the computer is around 90-95 kg. Cardiology was consulted recommended 120 mg of IV Lasix every 12 hours. There was a rise in creatinine so Lasix was held. 2-D echo showed an EF of 40% with diffuse hypokinesia, which is unchanged from previous. We'll discharge him home on Diovan which could be followed up by the heart failure clinic and change to [facility] an  outpatient.  Hypertensive emergency/Hypertensive heart disease: Clearly due to noncompliance with his medication. On IV nitroglycerin with improvement in his blood pressure. His blood pressure improved with a nitro drip, hydralazine, Lasix  lisinopril. Hydralazine was DC'd he was started on Diovan.  Osteogenesis imperfecta  AKI (acute kidney injury) ([place]) Likely secondary to overdiuresis. Baseline creatinine is around 1.   his creatinine is slowly trending down to baseline.  Hypokalemia: Likely due to diuresis. Now improved.     Discharge Instructions  Discharge Instructions    Diet - low sodium heart healthy    Complete by:  As directed    Increase activity slowly    Complete by:  As directed        Medication List    STOP taking these medications   digoxin 0.125 MG tablet Commonly known as:  LANOXIN   hydrALAZINE 25 MG tablet Commonly known as:  APRESOLINE   isosorbide dinitrate 20 MG tablet Commonly known as:  ISORDIL   metolazone 2.5 MG tablet Commonly known as:  ZAROXOLYN   sacubitril-valsartan 49-51 MG Commonly known as:  ENTRESTO     TAKE these medications   aspirin EC 81 MG tablet Take 1 tablet (81 mg total) by mouth daily.   carvedilol 3.125 MG tablet Commonly known as:  COREG Take 1 tablet (3.125 mg total) by mouth 2 (two) times daily with a meal. What changed:  medication strength  how much to take   erythromycin ophthalmic ointment Place a 1/2 inch ribbon of ointment into the lower eyelid. Four times a day for 7 days   furosemide 40 MG tablet Commonly known as:  LASIX TAKE TWO TABLETS BY MOUTH IN THE MORNING AND TAKE ONE TABLETS BY MOUTH IN THE EVENING What  changed:  See the new instructions.   potassium chloride SA 20 MEQ tablet Commonly known as:  K-DUR,KLOR-CON Take 2 tablets (40 mEq total) by mouth daily. Take extra 20 meq tablet on Mondays and Fridays What changed:  how much to take  when to take this  additional  instructions   valsartan 160 MG tablet Commonly known as:  DIOVAN Take 1 tablet (160 mg total) by mouth daily.      Follow-up Information    [facility] Follow up.   Why:  [place] nursing Contact information: [place] [address] [phone]          Allergies  Allergen Reactions  . Fish Allergy Anaphylaxis  . Peanuts [Peanut Oil] Anaphylaxis  . Shellfish Allergy Anaphylaxis  . Latex Itching    Consultations:  Cardiology.   Procedures/Studies: Dg Chest 2 View  Result Date: 02/21/2016 CLINICAL DATA:  Acute onset of shortness of breath and difficulty breathing. Decreased O2 saturation. Initial encounter. EXAM: CHEST  2 VIEW COMPARISON:  Chest radiograph performed 03/28/2015 FINDINGS: The lungs are well-aerated. Vascular congestion is noted. Increased interstitial markings raise concern for mild interstitial edema. There is no evidence of pleural effusion or pneumothorax. The heart is mildly enlarged. No acute osseous abnormalities are seen. Chronic compression deformity is noted at the lower thoracic spine. IMPRESSION: Vascular congestion and mild cardiomegaly. Increased interstitial markings raise concern for mild interstitial edema. Electronically Signed   By: Garald Balding M.D.   On: 02/21/2016 06:41     Subjective: Reason shortness of breath is at baseline.  Discharge Exam: Vitals:   02/26/16 2050 02/27/16 0403  BP: 115/66 125/61  Pulse: 84 82  Resp: 20 20  Temp: 98.2 F (36.8 C) 98 F (36.7 C)   Vitals:   02/26/16 2050 02/27/16 0019 02/27/16 0403 02/27/16 0500  BP: 115/66  125/61   Pulse: 84  82   Resp: 20  20   Temp: 98.2 F (36.8 C)  98 F (36.7 C)   TempSrc: Oral  Oral   SpO2: 97%  95%   Weight:  95.4 kg (210 lb 5.1 oz)  95.4 kg (210 lb 5.1 oz)  Height:        General: Pt is alert, awake, not in acute distress Cardiovascular: RRR, S1/S2 +, no rubs, no gallops Respiratory: CTA bilaterally, no wheezing, no  rhonchi Abdominal: Soft, NT, ND, bowel sounds + Extremities: no edema, no cyanosis    The results of significant diagnostics from this hospitalization (including imaging, microbiology, ancillary and laboratory) are listed below for reference.     Microbiology: Recent Results (from the past 240 hour(s))  MRSA PCR Screening     Status: None   Collection Time: 02/21/16 12:09 PM  Result Value Ref Range Status   MRSA by PCR NEGATIVE NEGATIVE Final    Comment:        The GeneXpert MRSA Assay (FDA approved for NASAL specimens only), is one component of a comprehensive MRSA colonization surveillance program. It is not intended to diagnose MRSA infection nor to guide or monitor treatment for MRSA infections.      Labs: BNP (last 3 results)  Recent Labs  03/28/15 1221 03/28/15 1848 02/21/16 0624  BNP 788.2* 740.7* 676.1*   Basic Metabolic Panel:  Recent Labs Lab 02/23/16 0348 02/24/16 0441 02/25/16 0537 02/26/16 0447 02/27/16 1011  NA 138 135 139 139 138  K 3.4* 3.3* 3.6 4.0 4.0  CL 88* 87* 92* 94* 97*  CO2 40* 37* 35* 35* 33*  GLUCOSE 105* 119* 112* 94 100*  BUN 40* 57* 68* 61* 42*  CREATININE 1.54* 1.79* 2.17* 1.50* 1.21  CALCIUM 9.6 9.1 9.1 8.8* 9.1  MG 2.0  --   --   --   --    Liver Function Tests: No results for input(s): AST, ALT, ALKPHOS, BILITOT, PROT, ALBUMIN in the last 168 hours. No results for input(s): LIPASE, AMYLASE in the last 168 hours. No results for input(s): AMMONIA in the last 168 hours. CBC:  Recent Labs Lab 02/23/16 0348 02/26/16 0447  WBC 9.9 7.7  HGB 15.3 14.8  HCT 51.5 47.0  MCV 96.8 93.1  PLT 235 232   Cardiac Enzymes: No results for input(s): CKTOTAL, CKMB, CKMBINDEX, TROPONINI in the last 168 hours. BNP: Invalid input(s): POCBNP CBG: No results for input(s): GLUCAP in the last 168 hours. D-Dimer No results for input(s): DDIMER in the last 72 hours. Hgb A1c No results for input(s): HGBA1C in the last 72 hours. Lipid  Profile No results for input(s): CHOL, HDL, LDLCALC, TRIG, CHOLHDL, LDLDIRECT in the last 72 hours. Thyroid function studies No results for input(s): TSH, T4TOTAL, T3FREE, THYROIDAB in the last 72 hours.  Invalid input(s): FREET3 Anemia work up No results for input(s): VITAMINB12, FOLATE, FERRITIN, TIBC, IRON, RETICCTPCT in the last 72 hours. Urinalysis    Component Value Date/Time   COLORURINE YELLOW 05/26/2011 2259   APPEARANCEUR CLEAR 05/26/2011 2259   LABSPEC 1.028 05/26/2011 2259   PHURINE 6.5 05/26/2011 2259   GLUCOSEU NEGATIVE 05/26/2011 2259   HGBUR NEGATIVE 05/26/2011 2259   BILIRUBINUR NEGATIVE 05/26/2011 2259   KETONESUR NEGATIVE 05/26/2011 2259   PROTEINUR 30 (A) 05/26/2011 2259   UROBILINOGEN 1.0 05/26/2011 2259   NITRITE NEGATIVE 05/26/2011 2259   LEUKOCYTESUR NEGATIVE 05/26/2011 2259   Sepsis Labs Invalid input(s): PROCALCITONIN,  WBC,  LACTICIDVEN Microbiology Recent Results (from the past 240 hour(s))  MRSA PCR Screening     Status: None   Collection Time: 02/21/16 12:09 PM  Result Value Ref Range Status   MRSA by PCR NEGATIVE NEGATIVE Final    Comment:        The GeneXpert MRSA Assay (FDA approved for NASAL specimens only), is one component of a comprehensive MRSA colonization surveillance program. It is not intended to diagnose MRSA infection nor to guide or monitor treatment for MRSA infections.      Time coordinating discharge: Over 30 minutes  SIGNED:   Charlynne Cousins, MD  [facility] 02/29/2016, 11:47 AM Pager   If 7PM-7AM, please contact night-coverage [email or web address] Password [email or web address]

## 2016-02-27 NOTE — Care Management Note (Signed)
Case Management Note  Patient Details  Name: Jerry Edwards MRN: [id number] Date of Birth: Dec 14, 1970  Subjective/Objective:  [facility] rep Manuela Schwartz aware of d/c & HHRN orders. [facility] dme rep aware of home 02 concerns-will f/u @ home. No further CM needs.                   Action/Plan:d/c home w/HHC.   Expected Discharge Date:   (unknown)               Expected Discharge Plan:  [place]  In-House Referral:     Discharge planning Services  CM Consult  Post Acute Care Choice:  Durable Medical Equipment [facility] home [phone], has w/c) Choice offered to:  Patient  DME Arranged:    DME Agency:     HH Arranged:  RN [place] Agency:  [place]  Status of Service:  Completed, signed off  If discussed at [place] of Stay Meetings, dates discussed:    Additional Comments:  Dessa Phi, RN 02/27/2016, 12:27 PM

## 2016-02-28 DIAGNOSIS — Z9981 Dependence on supplemental oxygen: Secondary | ICD-10-CM | POA: Diagnosis not present

## 2016-02-28 DIAGNOSIS — I161 Hypertensive emergency: Secondary | ICD-10-CM | POA: Diagnosis not present

## 2016-02-28 DIAGNOSIS — I509 Heart failure, unspecified: Secondary | ICD-10-CM | POA: Diagnosis not present

## 2016-02-28 DIAGNOSIS — I110 Hypertensive heart disease with heart failure: Secondary | ICD-10-CM | POA: Diagnosis not present

## 2016-02-28 DIAGNOSIS — Q780 Osteogenesis imperfecta: Secondary | ICD-10-CM | POA: Diagnosis not present

## 2016-02-28 DIAGNOSIS — Z7982 Long term (current) use of aspirin: Secondary | ICD-10-CM | POA: Diagnosis not present

## 2016-02-28 DIAGNOSIS — I429 Cardiomyopathy, unspecified: Secondary | ICD-10-CM | POA: Diagnosis not present

## 2016-02-29 DIAGNOSIS — E662 Morbid (severe) obesity with alveolar hypoventilation: Secondary | ICD-10-CM

## 2016-02-29 DIAGNOSIS — G4733 Obstructive sleep apnea (adult) (pediatric): Secondary | ICD-10-CM

## 2016-03-02 ENCOUNTER — Other Ambulatory Visit: Payer: Self-pay

## 2016-03-02 NOTE — Patient Outreach (Signed)
[facility]) Care Management  03/02/2016  Jerry Edwards 1971-01-31 [id number]       EMMI- HF RED ON EMMI ALERT Day # 2 Date: 03/01/16 Red Alert Reason:"Weighed themselves today? No"    Outreach attempt # 1 to patient. Spoke with patient. Discussed and addressed red alert. Patient states that machine must have misunderstood him. He reports that he has been consistently weighing himself daily since discharge home from hospital. He reports weight at time of discharge was 215 lbs. Patient states weight this am was 212 lbs. He denies any issues with fluid retention, SOB or any other worsening symptoms. He has MD f/u appt on 03/05/16. No transportation issues-reports his mother will be taking him. He voices that he has gotten all his meds and denies any issues with affording and/or managing meds. No RN CM needs or concerns at this time. Discussed with patient that he will continue to get automated EMMI HF calls and will receive call from an RN CM if any of his responses are abnormal. He voiced understanding and was appreciative of call.     Plan: RN CM will notify [facility] administrative assistant of case status.    Enzo Montgomery, RN,BSN,CCM [place] Management Telephonic Care Management Coordinator Direct Phone: [phone] Toll Free: [phone] Fax: [phone]

## 2016-03-03 DIAGNOSIS — I110 Hypertensive heart disease with heart failure: Secondary | ICD-10-CM | POA: Diagnosis not present

## 2016-03-03 DIAGNOSIS — Z7982 Long term (current) use of aspirin: Secondary | ICD-10-CM | POA: Diagnosis not present

## 2016-03-03 DIAGNOSIS — I429 Cardiomyopathy, unspecified: Secondary | ICD-10-CM | POA: Diagnosis not present

## 2016-03-03 DIAGNOSIS — I161 Hypertensive emergency: Secondary | ICD-10-CM | POA: Diagnosis not present

## 2016-03-03 DIAGNOSIS — I509 Heart failure, unspecified: Secondary | ICD-10-CM | POA: Diagnosis not present

## 2016-03-03 DIAGNOSIS — Q780 Osteogenesis imperfecta: Secondary | ICD-10-CM | POA: Diagnosis not present

## 2016-03-05 ENCOUNTER — Other Ambulatory Visit: Payer: Self-pay

## 2016-03-05 ENCOUNTER — Ambulatory Visit (HOSPITAL_COMMUNITY)
Admission: RE | Admit: 2016-03-05 | Discharge: 2016-03-05 | Disposition: A | Discharge status: Home / Self Care | Payer: Medicare Other | Source: Ambulatory Visit | Attending: Cardiology | Admitting: Cardiology

## 2016-03-05 VITALS — BP 156/98 | HR 94 | Wt 218.4 lb

## 2016-03-05 DIAGNOSIS — N189 Chronic kidney disease, unspecified: Secondary | ICD-10-CM | POA: Diagnosis not present

## 2016-03-05 DIAGNOSIS — I5043 Acute on chronic combined systolic (congestive) and diastolic (congestive) heart failure: Secondary | ICD-10-CM | POA: Diagnosis not present

## 2016-03-05 DIAGNOSIS — I5022 Chronic systolic (congestive) heart failure: Secondary | ICD-10-CM | POA: Diagnosis not present

## 2016-03-05 DIAGNOSIS — J9620 Acute and chronic respiratory failure, unspecified whether with hypoxia or hypercapnia: Secondary | ICD-10-CM | POA: Insufficient documentation

## 2016-03-05 DIAGNOSIS — G4733 Obstructive sleep apnea (adult) (pediatric): Secondary | ICD-10-CM | POA: Insufficient documentation

## 2016-03-05 DIAGNOSIS — I429 Cardiomyopathy, unspecified: Secondary | ICD-10-CM | POA: Insufficient documentation

## 2016-03-05 DIAGNOSIS — Q780 Osteogenesis imperfecta: Secondary | ICD-10-CM

## 2016-03-05 DIAGNOSIS — Z7982 Long term (current) use of aspirin: Secondary | ICD-10-CM | POA: Diagnosis not present

## 2016-03-05 DIAGNOSIS — I130 Hypertensive heart and chronic kidney disease with heart failure and stage 1 through stage 4 chronic kidney disease, or unspecified chronic kidney disease: Secondary | ICD-10-CM | POA: Insufficient documentation

## 2016-03-05 DIAGNOSIS — J9601 Acute respiratory failure with hypoxia: Secondary | ICD-10-CM

## 2016-03-05 DIAGNOSIS — Z6841 Body Mass Index (BMI) 40.0 and over, adult: Secondary | ICD-10-CM | POA: Insufficient documentation

## 2016-03-05 DIAGNOSIS — E669 Obesity, unspecified: Secondary | ICD-10-CM | POA: Insufficient documentation

## 2016-03-05 DIAGNOSIS — J9611 Chronic respiratory failure with hypoxia: Secondary | ICD-10-CM

## 2016-03-05 DIAGNOSIS — Z993 Dependence on wheelchair: Secondary | ICD-10-CM | POA: Insufficient documentation

## 2016-03-05 DIAGNOSIS — E662 Morbid (severe) obesity with alveolar hypoventilation: Secondary | ICD-10-CM

## 2016-03-05 DIAGNOSIS — I272 Pulmonary hypertension, unspecified: Secondary | ICD-10-CM | POA: Diagnosis not present

## 2016-03-05 DIAGNOSIS — I110 Hypertensive heart disease with heart failure: Secondary | ICD-10-CM

## 2016-03-05 DIAGNOSIS — Z79899 Other long term (current) drug therapy: Secondary | ICD-10-CM | POA: Diagnosis not present

## 2016-03-05 DIAGNOSIS — J9621 Acute and chronic respiratory failure with hypoxia: Secondary | ICD-10-CM | POA: Insufficient documentation

## 2016-03-05 LAB — BASIC METABOLIC PANEL
ANION GAP: 10 (ref 5–15)
BUN: 16 mg/dL (ref 6–20)
CHLORIDE: 102 mmol/L (ref 101–111)
CO2: 29 mmol/L (ref 22–32)
Calcium: 8.9 mg/dL (ref 8.9–10.3)
Creatinine, Ser: 1 mg/dL (ref 0.61–1.24)
Glucose, Bld: 89 mg/dL (ref 65–99)
POTASSIUM: 4.5 mmol/L (ref 3.5–5.1)
SODIUM: 141 mmol/L (ref 135–145)

## 2016-03-05 LAB — BRAIN NATRIURETIC PEPTIDE: B NATRIURETIC PEPTIDE 5: 184.1 pg/mL — AB (ref 0.0–100.0)

## 2016-03-05 MED ORDER — SACUBITRIL-VALSARTAN 49-51 MG PO TABS: 1.0000 | ORAL_TABLET | Freq: Two times a day (BID) | ORAL | 3 refills | Status: DC

## 2016-03-05 NOTE — Patient Instructions (Signed)
Labs today (will call for abnormal results, otherwise no news is good news)  Labs In 2 Weeks  Start taking Entresto 49mg /51mg  (1 Tablet) Two Times daily.    STOP taking Valsartan once you start taking Entresto  Follow up in 4 weeks

## 2016-03-05 NOTE — Progress Notes (Signed)
Advanced Heart Failure Medication Review by a Pharmacist  Does the patient  feel that his/her medications are working for him/her?  yes  Has the patient been experiencing any side effects to the medications prescribed?  no  Does the patient measure his/her own blood pressure or blood glucose at home?  no   Does the patient have any problems obtaining medications due to transportation or finances?   yes  Understanding of regimen: good Understanding of indications: good Potential of compliance: good Patient understands to avoid NSAIDs. Patient understands to avoid decongestants.  Issues to address at subsequent visits: Rx insurance coverage   Pharmacist comments:  Jerry Edwards is a pleasant 45 yo M presenting with his mother and without a medication list. He reports good compliance with his regimen and did state that he does not currently have Rx insurance (working on [employer] for next year) but valsartan is >$140/mo. If switch to [facility], will have him enroll in [employer] patient assistance program.   Ruta Hinds. Velva Harman, PharmD, BCPS, CPP Clinical Pharmacist Pager: [phone] Phone: [phone] 03/05/2016 11:02 AM      Time with patient: 10 minutes Preparation and documentation time: 2 minutes Total time: 12 minutes

## 2016-03-05 NOTE — Patient Outreach (Signed)
[facility]) Care Management  03/05/2016  Jerry Edwards 12-16-1970 [id number]       EMMI- HF RED ON EMMI ALERT Day # 5 Date: 03/04/16 Red Alert Reason: "weight 215 lbs"   Outreach attempt #1 to patient. No answer at present and voicemail not set up.     Plan: RN CM will make outreach attempt to patient within one business day.  Enzo Montgomery, RN,BSN,CCM [facility] Telephonic Care Management Coordinator Direct Phone: [phone] Toll Free: [phone] Fax: [phone]

## 2016-03-05 NOTE — Progress Notes (Signed)
Patient ID: Jerry Edwards, male   DOB: [birth year], 45 y.o.   MRN: [id number]   [facility]INIC     Primary Physician: Jerry Shire, MD  Primary Cardiologist: Jerry Edwards  HPI: Jerry Edwards is a 45 year old with history of NICM, Chronic systolic heart failure, HTN, OSA, and osteogenic imperfecta.   Admitted to [facility] 1/17 through 04/21/13 with increased dyspnea and mild volume overload. Question regarding possible infiltrative disease. ECHO 04/08/2013. EF 25%. cMRI 12/15 EF 38% possible infiltrative disease with discrete areas of non-coronary pattern delayed enhancement. Coronaries were normal on LHC.  SPEP no M spike and UPEP no free light chains. HIV negative and TSH nl.CT of chest no evidence of pulmonary infiltrates. D/C weight 193 pounds.   He was admitted in January 2017 with recurrent HF, severe HTN and hypoxemia. Diuresed and discharged home. Saw Jerry Edwards a few weeks and was not taking Bidl because he couldn't afford. So given scripts for hydralazine and Imdur and now can afford. Also started on home O2 for sats in 70s.   Admitted 02/21/16 with acute respiratory failure. Treated with IV lasix but slowed down with creatinine rise. Also had hypertensive emergency on arrival requiring IV NTG. Likely due to non-compliance, as he had stopped several of his medications.    Last seen in HF clinic 06/18/15. He presents today for post hospital follow up. Pt states he was NOT off of his medications, contrary to hospital notes.  States he had not been watching his diet, Eats a big cup of ice multiple times a day. Works 3 nights a week at [employer].  WC bound. Denies SOB transitioning to toilet or bed.  Not SOB changing clothes or bathing. Weight at home usually to 215 lbs. Taking lasix 80 mg q am and 40 mg q pm and pees well. Denies edema or abdominal distention since discharge from hospital.    Echo 1/17 30-35% RV ok  Echo 02/22/16 LVEF 40%, Grade 1 DD.    LHC/RHC 04/08/13  Normal coronaries RA  pressure:  39/39 with a mean of 34. Prominent X. and Y. Descends.  RV pressure: 76/26  mean 4 PA pressure: 70/36 with a mean of 51  Pulmonary capillary wedge pressure: 31/30 and mean of 29  LV pressure: 140/26 with a left ventricular end-diastolic pressure of 35  PA sat 66%  AO sat 97%  CO/CI 4.94/2.63  Pulmonary vascular resistance: 4.45 Woods units   Labs (1/15): K 4.6, creatinine 1.25, TSH normal, UPEP negative, SPEP negative, transferrin saturation 39%, HIV negative, ACE level not elevated.  Labs 1/17: K 4.4 creatinine 0.89  ROS: All systems negative except as listed in HPI, PMH and Problem List.  PMH: 1. HTN 2. Osteogenesis imperfecta: wheelchair-bound.  History of bone fractures.  3. Suspect OSA 4. CKD 5. Nonischemic cardiomyopathy: Echo (1/15) with EF 20-25%, severe diffuse hypokinesis, mild Jerry, mildly enlarged RV with moderately decreased systolic function.  LHC/RHC (1/15) with normal coronaries, mean RA 34, PA 70/36 mean 51, mean PCWP 29, CI 2.63.  Cardiac MRI (1/15) with EF 38%, global hypokinesis, discrete areas of delayed enhancement in the subepicardial mid inferoseptal RV insertion site, the mid wall of the mid inferolateral wall, and the subendocardial mid anterolateral wall.  This pattern was suggestive of infiltrative disease.  UPEP/SPEP negative, HIV negative.  Chest CT to look for evidence of sarcoidosis did not show any sarcoid-type lesions.    SH:  Single, prior smoking now quit, occasional marijuana, works as Dispensing optician.  FH:  HTN, no significant cardiac disease.   Current Outpatient Prescriptions  Medication Sig Dispense Refill  . aspirin 81 MG tablet Take 1 tablet (81 mg total) by mouth daily.    . carvedilol (COREG) 3.125 MG tablet Take 1 tablet (3.125 mg total) by mouth 2 (two) times daily with a meal. 30 tablet 0  . furosemide (LASIX) 40 MG tablet Take 40-80 mg by mouth 2 (two) times daily. Take 80 mg (2 tablets) in the morning and 40 mg (1 tablet) in the afternoon     . potassium chloride SA (K-DUR,KLOR-CON) 20 MEQ tablet Take 40 mEq by mouth daily.    . valsartan (DIOVAN) 160 MG tablet Take 1 tablet (160 mg total) by mouth daily. 30 tablet 0   No current facility-administered medications for this encounter.     PHYSICAL EXAM: Vitals:   03/05/16 1052  BP: (!) 156/98  BP Location: Left Arm  Patient Position: Sitting  Cuff Size: Normal  Pulse: 94  SpO2: (!) 74%  Weight: 218 lb 6.4 oz (99.1 kg)   Sp02 - 92% on 2 L.   General: Small stature. NAD. In [place]. Mom present.   HEENT: Normal Neck: supple. JVP difficult to assess, does not appear elevated. Carotids 2+ bilaterally; no bruits. No thyromegaly or nodule noted.  Cor: PMI normal. RRR. No M/G/R.  Lungs: CTAB, normal effort.  Abdomen: soft, NT, ND, no HSM. No bruits or masses. +BS  Extremities: no cyanosis, clubbing, rash. Deformed due to osteogenesis imperfecta. Trace ankle edema at most.  Neuro: alert & orientedx3, cranial nerves grossly intact. Moves all 4 extremities w/o difficulty. Affect pleasant.   ASSESSMENT & PLAN: 1. Chronic Systolic Heart Failure. NICM EF 25% with no coronary disease on LHC in 1/15. 03/2013 cMRI possible infiltrative disease. SPEP and UPEP ok. EF 30-35% echo 1/17. Based on the cMRI pattern and concern for cardiac sarcoidosis, a chest CT was done to look for evidence of pulmonary sarcoidosis.  This was not indicative of pulmonary sarcoidosis.  Patient has had no significant arrhythmias detected.  QRS on ECG is not widened.   - NYHA class 2 symptoms currently.  - Continue lasix 80 mg q am and 40 mg q pm. Can take extra 40 mg as needed.    - Coreg at 3.125 mg BID. Will anticipate increasing at next visit.   - Stop Diovan. Start entresto 49/51 mg BID. BMET today and repeat 2 weeks.  - Can consider adding back Hydral/imdur as tolerated. - Reinforced fluid restriction to < 2 L daily, sodium restriction to less than 2000 mg daily, and the importance of daily weights.   - EF  40% on echo this month.  Now out of range for ICD.  2. Osteogenesis imperfecta  - Wheel chair bound.  3. HTN  - Switching to [facility] as above.  4. OSA  - Does not have CPAP at home. Will try and work on getting at next appt.  5. Obesity 6. Pulmonary hypertension- This appears to be primarily pulmonary venous hypertension on the recent RHC.  There may be a component of PAH related to OSA.   7. Chronic respiratory failure - needs portable 02.  Will write order today.  He de-saturated into the 70s rolling back into clinic in his chair, then improved up to 92% on 2 L via [place] at rest.   Labs today. Switch to [facility].  RTC 4 weeks.   Shirley Friar, PA-C 03/05/2016   Total time spent >  25 minutes. Over half that spent discussing the above.

## 2016-03-06 ENCOUNTER — Other Ambulatory Visit: Payer: Self-pay

## 2016-03-06 DIAGNOSIS — I429 Cardiomyopathy, unspecified: Secondary | ICD-10-CM | POA: Diagnosis not present

## 2016-03-06 DIAGNOSIS — Q780 Osteogenesis imperfecta: Secondary | ICD-10-CM | POA: Diagnosis not present

## 2016-03-06 DIAGNOSIS — I110 Hypertensive heart disease with heart failure: Secondary | ICD-10-CM | POA: Diagnosis not present

## 2016-03-06 DIAGNOSIS — I509 Heart failure, unspecified: Secondary | ICD-10-CM | POA: Diagnosis not present

## 2016-03-06 DIAGNOSIS — I161 Hypertensive emergency: Secondary | ICD-10-CM | POA: Diagnosis not present

## 2016-03-06 DIAGNOSIS — Z7982 Long term (current) use of aspirin: Secondary | ICD-10-CM | POA: Diagnosis not present

## 2016-03-06 NOTE — Patient Outreach (Signed)
[facility]) Care Management  03/06/2016  Jerry Edwards 1970/09/16 [id number]   EMMI- HF RED ON EMMI ALERT Day # 5 Date: 03/04/16 Red Alert Reason: "weight 215 lbs"    Outreach attempt # 2 to patient. Spoke with patient and addressed red alert. He reports that his weight has been staying between 213-215 lbs. He was still asleep prior to RN CM call so has not weighed today. However, he states weight yesterday was 215 lbs. He denies any issues with SOB and or fluid retention. Patient verbalizes that he had MD f/u appt yesterday and appt went well. No further RN CM needs or concerns at this time.     Plan: RN CM will notify [facility] administrative assistant of case status.  Jerry Montgomery, RN,BSN,CCM [facility] Telephonic Care Management Coordinator Direct Phone: [phone] Toll Free: [phone] Fax: [phone]

## 2016-03-08 ENCOUNTER — Other Ambulatory Visit (HOSPITAL_COMMUNITY): Payer: Self-pay | Admitting: Internal Medicine

## 2016-03-09 ENCOUNTER — Telehealth (HOSPITAL_COMMUNITY): Payer: Self-pay | Admitting: *Deleted

## 2016-03-09 DIAGNOSIS — I161 Hypertensive emergency: Secondary | ICD-10-CM | POA: Diagnosis not present

## 2016-03-09 DIAGNOSIS — Q780 Osteogenesis imperfecta: Secondary | ICD-10-CM | POA: Diagnosis not present

## 2016-03-09 DIAGNOSIS — I429 Cardiomyopathy, unspecified: Secondary | ICD-10-CM | POA: Diagnosis not present

## 2016-03-09 DIAGNOSIS — Z7982 Long term (current) use of aspirin: Secondary | ICD-10-CM | POA: Diagnosis not present

## 2016-03-09 DIAGNOSIS — I509 Heart failure, unspecified: Secondary | ICD-10-CM | POA: Diagnosis not present

## 2016-03-09 DIAGNOSIS — I110 Hypertensive heart disease with heart failure: Secondary | ICD-10-CM | POA: Diagnosis not present

## 2016-03-09 NOTE — Telephone Encounter (Signed)
[name] with [facility] called asking for verbal order for Home PT on patient, verbal order given.  No further questions at this time.

## 2016-03-10 DIAGNOSIS — Z7982 Long term (current) use of aspirin: Secondary | ICD-10-CM | POA: Diagnosis not present

## 2016-03-10 DIAGNOSIS — I110 Hypertensive heart disease with heart failure: Secondary | ICD-10-CM | POA: Diagnosis not present

## 2016-03-10 DIAGNOSIS — I429 Cardiomyopathy, unspecified: Secondary | ICD-10-CM | POA: Diagnosis not present

## 2016-03-10 DIAGNOSIS — I161 Hypertensive emergency: Secondary | ICD-10-CM | POA: Diagnosis not present

## 2016-03-10 DIAGNOSIS — Q780 Osteogenesis imperfecta: Secondary | ICD-10-CM | POA: Diagnosis not present

## 2016-03-10 DIAGNOSIS — I509 Heart failure, unspecified: Secondary | ICD-10-CM | POA: Diagnosis not present

## 2016-03-13 DIAGNOSIS — I110 Hypertensive heart disease with heart failure: Secondary | ICD-10-CM | POA: Diagnosis not present

## 2016-03-13 DIAGNOSIS — I509 Heart failure, unspecified: Secondary | ICD-10-CM | POA: Diagnosis not present

## 2016-03-13 DIAGNOSIS — Q780 Osteogenesis imperfecta: Secondary | ICD-10-CM | POA: Diagnosis not present

## 2016-03-13 DIAGNOSIS — I429 Cardiomyopathy, unspecified: Secondary | ICD-10-CM | POA: Diagnosis not present

## 2016-03-13 DIAGNOSIS — I161 Hypertensive emergency: Secondary | ICD-10-CM | POA: Diagnosis not present

## 2016-03-13 DIAGNOSIS — Z7982 Long term (current) use of aspirin: Secondary | ICD-10-CM | POA: Diagnosis not present

## 2016-03-18 ENCOUNTER — Telehealth (HOSPITAL_COMMUNITY): Payer: Self-pay | Admitting: Cardiology

## 2016-03-18 DIAGNOSIS — Q780 Osteogenesis imperfecta: Secondary | ICD-10-CM | POA: Diagnosis not present

## 2016-03-18 DIAGNOSIS — I429 Cardiomyopathy, unspecified: Secondary | ICD-10-CM | POA: Diagnosis not present

## 2016-03-18 DIAGNOSIS — Z7982 Long term (current) use of aspirin: Secondary | ICD-10-CM | POA: Diagnosis not present

## 2016-03-18 DIAGNOSIS — I509 Heart failure, unspecified: Secondary | ICD-10-CM | POA: Diagnosis not present

## 2016-03-18 DIAGNOSIS — I161 Hypertensive emergency: Secondary | ICD-10-CM | POA: Diagnosis not present

## 2016-03-18 DIAGNOSIS — I110 Hypertensive heart disease with heart failure: Secondary | ICD-10-CM | POA: Diagnosis not present

## 2016-03-18 NOTE — Telephone Encounter (Signed)
Patient will be discharged from [facility] as he is no longer home bound

## 2016-03-19 ENCOUNTER — Other Ambulatory Visit (HOSPITAL_COMMUNITY): Payer: Self-pay | Admitting: *Deleted

## 2016-03-19 ENCOUNTER — Ambulatory Visit (HOSPITAL_COMMUNITY)
Admission: RE | Admit: 2016-03-19 | Discharge: 2016-03-19 | Disposition: A | Discharge status: Home / Self Care | Payer: Medicare Other | Source: Ambulatory Visit | Attending: Cardiology | Admitting: Cardiology

## 2016-03-19 DIAGNOSIS — I5043 Acute on chronic combined systolic (congestive) and diastolic (congestive) heart failure: Secondary | ICD-10-CM | POA: Diagnosis not present

## 2016-03-19 LAB — BASIC METABOLIC PANEL
Anion gap: 8 (ref 5–15)
BUN: 10 mg/dL (ref 6–20)
CALCIUM: 9.5 mg/dL (ref 8.9–10.3)
CO2: 31 mmol/L (ref 22–32)
CREATININE: 0.86 mg/dL (ref 0.61–1.24)
Chloride: 104 mmol/L (ref 101–111)
GFR calc Af Amer: >60 mL/min (ref >60)
GFR calc non Af Amer: >60 mL/min (ref >60)
GLUCOSE: 114 mg/dL — AB (ref 65–99)
Potassium: 4.2 mmol/L (ref 3.5–5.1)
Sodium: 143 mmol/L (ref 135–145)

## 2016-03-19 MED ORDER — CARVEDILOL 3.125 MG PO TABS: 3.1250 mg | ORAL_TABLET | Freq: Two times a day (BID) | ORAL | 3 refills | Status: DC

## 2016-03-19 MED ORDER — CARVEDILOL 3.125 MG PO TABS: 3.1250 mg | ORAL_TABLET | Freq: Two times a day (BID) | ORAL | 0 refills | Status: DC

## 2016-03-31 ENCOUNTER — Other Ambulatory Visit: Payer: Self-pay

## 2016-03-31 NOTE — Patient Outreach (Signed)
[facility]) Care Management  03/31/2016  Obed Samek 1971-01-05 [id number]       EMMI-HF RED ON EMMI ALERT Day # 55 Date:03/30/16 Red Alert Reason: "New/worsening problems? Yes"    Outreach attempt #1 to patient. Spoke with patient. Reviewed and addressed red alert. Patient states that machine misunderstood him as his response to the question was no. Patient denies any new/worsening problems. He reports that he has been feeling and doing fine. He voices no other needs or concerns at this time. Advised patient that he was on day #31 of 45 day HF EMMI calls. He voiced understanding and was appreciative of call.     Plan: RN CM will notify [facility] administrative assistant of case status.    Enzo Montgomery, RN,BSN,CCM [place] [facility] Telephonic Care Management Coordinator Direct Phone: [phone] Toll Free: [phone] Fax: [phone]

## 2016-04-02 ENCOUNTER — Encounter (HOSPITAL_COMMUNITY): Payer: Self-pay

## 2016-04-02 ENCOUNTER — Ambulatory Visit (HOSPITAL_COMMUNITY)
Admission: RE | Admit: 2016-04-02 | Discharge: 2016-04-02 | Disposition: A | Discharge status: Home / Self Care | Payer: Medicare Other | Source: Ambulatory Visit | Attending: Cardiology | Admitting: Cardiology

## 2016-04-02 VITALS — BP 178/110 | HR 88 | Wt 217.0 lb

## 2016-04-02 DIAGNOSIS — I161 Hypertensive emergency: Secondary | ICD-10-CM | POA: Diagnosis not present

## 2016-04-02 DIAGNOSIS — E669 Obesity, unspecified: Secondary | ICD-10-CM | POA: Insufficient documentation

## 2016-04-02 DIAGNOSIS — I5022 Chronic systolic (congestive) heart failure: Secondary | ICD-10-CM | POA: Insufficient documentation

## 2016-04-02 DIAGNOSIS — Z993 Dependence on wheelchair: Secondary | ICD-10-CM | POA: Insufficient documentation

## 2016-04-02 DIAGNOSIS — Z79899 Other long term (current) drug therapy: Secondary | ICD-10-CM | POA: Insufficient documentation

## 2016-04-02 DIAGNOSIS — G4733 Obstructive sleep apnea (adult) (pediatric): Secondary | ICD-10-CM | POA: Diagnosis not present

## 2016-04-02 DIAGNOSIS — I429 Cardiomyopathy, unspecified: Secondary | ICD-10-CM | POA: Diagnosis not present

## 2016-04-02 DIAGNOSIS — N189 Chronic kidney disease, unspecified: Secondary | ICD-10-CM | POA: Diagnosis not present

## 2016-04-02 DIAGNOSIS — I272 Pulmonary hypertension, unspecified: Secondary | ICD-10-CM | POA: Insufficient documentation

## 2016-04-02 DIAGNOSIS — I428 Other cardiomyopathies: Secondary | ICD-10-CM | POA: Diagnosis not present

## 2016-04-02 DIAGNOSIS — Z7982 Long term (current) use of aspirin: Secondary | ICD-10-CM | POA: Insufficient documentation

## 2016-04-02 DIAGNOSIS — I130 Hypertensive heart and chronic kidney disease with heart failure and stage 1 through stage 4 chronic kidney disease, or unspecified chronic kidney disease: Secondary | ICD-10-CM | POA: Insufficient documentation

## 2016-04-02 DIAGNOSIS — J9611 Chronic respiratory failure with hypoxia: Secondary | ICD-10-CM | POA: Diagnosis not present

## 2016-04-02 DIAGNOSIS — Q780 Osteogenesis imperfecta: Secondary | ICD-10-CM

## 2016-04-02 LAB — BASIC METABOLIC PANEL
ANION GAP: 10 (ref 5–15)
BUN: 10 mg/dL (ref 6–20)
CO2: 28 mmol/L (ref 22–32)
Calcium: 8.9 mg/dL (ref 8.9–10.3)
Chloride: 102 mmol/L (ref 101–111)
Creatinine, Ser: 0.87 mg/dL (ref 0.61–1.24)
GLUCOSE: 106 mg/dL — AB (ref 65–99)
POTASSIUM: 3.8 mmol/L (ref 3.5–5.1)
Sodium: 140 mmol/L (ref 135–145)

## 2016-04-02 LAB — BRAIN NATRIURETIC PEPTIDE: B NATRIURETIC PEPTIDE 5: 384.3 pg/mL — AB (ref 0.0–100.0)

## 2016-04-02 MED ORDER — ISOSORBIDE MONONITRATE ER 30 MG PO TB24: 15.0000 mg | ORAL_TABLET | Freq: Every day | ORAL | 3 refills | Status: DC

## 2016-04-02 MED ORDER — HYDRALAZINE HCL 25 MG PO TABS: 25.0000 mg | ORAL_TABLET | Freq: Three times a day (TID) | ORAL | 3 refills | Status: DC

## 2016-04-02 NOTE — Patient Instructions (Signed)
START Imdur 15 mg, one tab daily START Hydralazine 25 mg, one tab three times per day  Labs today We will only contact you if something comes back abnormal or we need to make some changes. Otherwise no news is good news!  Your physician has requested that you regularly monitor and record your blood pressure readings at home. Please use the same machine at the same time of day to check your readings and record them to bring to your follow-up visit. -If you SBP (top number) continues to be above 160 please give our office a call  Your physician recommends that you schedule a follow-up appointment in: 2 weeks with Doroteo Bradford PharmD and 6 weeks with Rebecca Eaton  Do the following things EVERYDAY: 1) Weigh yourself in the morning before breakfast. Write it down and keep it in a log. 2) Take your medicines as prescribed 3) Eat low salt foods-Limit salt (sodium) to 2000 mg per day.  4) Stay as active as you can everyday 5) Limit all fluids for the day to less than 2 liters

## 2016-04-02 NOTE — Progress Notes (Signed)
Patient ID: Jerry Edwards, male   DOB: 1970-05-07, 46 y.o.   MRN: [id number]   [facility]ANCED [facility] CLINIC     Primary Physician: [name], MD  Primary Cardiologist: [name]  HPI: Mr Jerry Edwards is a 46 year old with history of NICM, Chronic systolic heart failure, HTN, OSA, and osteogenic imperfecta.   Admitted to [facility] 1/17 through 04/21/13 with increased dyspnea and mild volume overload. Question regarding possible infiltrative disease. ECHO 04/08/2013. EF 25%. cMRI 12/15 EF 38% possible infiltrative disease with discrete areas of non-coronary pattern delayed enhancement. Coronaries were normal on LHC.  SPEP no M spike and UPEP no free light chains. HIV negative and TSH nl.CT of chest no evidence of pulmonary infiltrates. D/C weight 193 pounds.   He was admitted in January 2017 with recurrent HF, severe HTN and hypoxemia. Diuresed and discharged home. Saw Dr. Acie Fredrickson a few weeks and was not taking Bidl because he couldn't afford. So given scripts for hydralazine and Imdur and now can afford. Also started on home O2 for sats in 70s.   Admitted 02/21/16 with acute respiratory failure. Treated with IV lasix but slowed down with creatinine rise. Also had hypertensive emergency on arrival requiring IV NTG. Likely due to non-compliance, as he had stopped several of his medications.    He presents today for regular follow up. A last visit switched to [facility]. BP high on arrival to clinic, improved with rest. States he took his medications only about 30 minutes prior to his arrival here. Has cut back on ice and watching sodium.  Eating more fruit.  Weight at home 214 this am and stable.  Has not needed any extra lasix. Still working 3 nights a week at [employer]. No edema, lightheadedness, dizziness, CP, or abdominal distention.   ROS: All systems negative except as listed in HPI, PMH, and problem list.   Echo 1/17 30-35% RV ok  Echo 02/22/16 LVEF 40%, Grade 1 DD.    LHC/RHC 04/08/13  Normal coronaries RA  pressure:  39/39 with a mean of 34. Prominent X. and Y. Descends.  RV pressure: 76/26  mean 4 PA pressure: 70/36 with a mean of 51  Pulmonary capillary wedge pressure: 31/30 and mean of 29  LV pressure: 140/26 with a left ventricular end-diastolic pressure of 35  PA sat 66%  AO sat 97%  CO/CI 4.94/2.63  Pulmonary vascular resistance: 4.45 Woods units   Labs (1/15): K 4.6, creatinine 1.25, TSH normal, UPEP negative, SPEP negative, transferrin saturation 39%, HIV negative, ACE level not elevated.  Labs 1/17: K 4.4 creatinine 0.89  PMH: 1. HTN 2. Osteogenesis imperfecta: wheelchair-bound.  History of bone fractures.  3. Suspect OSA 4. CKD 5. Nonischemic cardiomyopathy: Echo (1/15) with EF 20-25%, severe diffuse hypokinesis, mild MR, mildly enlarged RV with moderately decreased systolic function.  LHC/RHC (1/15) with normal coronaries, mean RA 34, PA 70/36 mean 51, mean PCWP 29, CI 2.63.  Cardiac MRI (1/15) with EF 38%, global hypokinesis, discrete areas of delayed enhancement in the subepicardial mid inferoseptal RV insertion site, the mid wall of the mid inferolateral wall, and the subendocardial mid anterolateral wall.  This pattern was suggestive of infiltrative disease.  UPEP/SPEP negative, HIV negative.  Chest CT to look for evidence of sarcoidosis did not show any sarcoid-type lesions.    SH:  Single, prior smoking now quit, occasional marijuana, works as Dispensing optician.   FH:  HTN, no significant cardiac disease.   Current Outpatient Prescriptions  Medication Sig Dispense Refill  . aspirin  81 MG tablet Take 1 tablet (81 mg total) by mouth daily.    . carvedilol (COREG) 3.125 MG tablet Take 1 tablet (3.125 mg total) by mouth 2 (two) times daily with a meal. 60 tablet 3  . furosemide (LASIX) 40 MG tablet Take 40-80 mg by mouth 2 (two) times daily. Take 80 mg (2 tablets) in the morning and 40 mg (1 tablet) in the afternoon    . potassium chloride SA (KLOR-CON M20) 20 MEQ tablet Take 2 tablets (40  mEq total) by mouth daily. 30 tablet 3  . sacubitril-valsartan (ENTRESTO) 49-51 MG Take 1 tablet by mouth 2 (two) times daily. 60 tablet 3  . hydrALAZINE (APRESOLINE) 25 MG tablet Take 1 tablet (25 mg total) by mouth 3 (three) times daily. 270 tablet 3  . isosorbide mononitrate (IMDUR) 30 MG 24 hr tablet Take 0.5 tablets (15 mg total) by mouth daily. 45 tablet 3   No current facility-administered medications for this encounter.     PHYSICAL EXAM: Vitals:   04/02/16 1216  BP: (!) 178/110  BP Location: Left Arm  Patient Position: Sitting  Cuff Size: Normal  Pulse: 88  SpO2: (!) 88%  Weight: 217 lb (98.4 kg)    Wt Readings from Last 3 Encounters:  04/02/16 217 lb (98.4 kg)  03/05/16 218 lb 6.4 oz (99.1 kg)  02/27/16 210 lb 5.1 oz (95.4 kg)     General: NAD. Small status. In [place]. NAD    HEENT: Normal Neck: supple. JVP does not appear elevated. Carotids. 2+ bilaterally; no bruits. No thyromegaly or nodule noted.  Cor: PMI normal. RRR. No M/G/R Lungs: Clear, normal effort Abdomen: soft, NT, ND, no HSM. No bruits or masses. +BS  Extremities: no cyanosis, clubbing, rash. Deformed due to osteogenesis imperfecta. No peripheral edema.   Neuro: A&Ox3, cranial nerves grossly intact. Moves all 4 extremities w/o difficulty. Affect very pleasant.   ASSESSMENT & PLAN: 1. Chronic Systolic Heart Failure. NICM EF 25% with no coronary disease on LHC in 1/15. 03/2013 cMRI possible infiltrative disease. SPEP and UPEP ok. EF 30-35% echo 1/17. Based on the cMRI pattern and concern for cardiac sarcoidosis, a chest CT was done to look for evidence of pulmonary sarcoidosis.  This was not indicative of pulmonary sarcoidosis.  Patient has had no significant arrhythmias detected.  QRS on ECG is not widened.   - NYHA class 2 symptoms currently.  - Continue lasix 80 mg q am and 40 mg q pm. Can take extra 40 mg as needed.    - Continue coreg 3.25 mg BID for now.    - Continue entresto 49/51 mg BID. BMET today  and repeat 2 weeks.  - Start hydralazine 25 mg TID and imdur 15 mg daily.  - Reinforced fluid restriction to < 2 L daily, sodium restriction to less than 2000 mg daily, and the importance of daily weights.   - Echo 02/22/16 EF 40% -> Out of range for ICD consideration.   2. Osteogenesis imperfecta  - Wheel chair bound; limited mobility.  3. HTN urgency. - Med changes as above.   4. OSA  - Has CPAP, but no 02 connector. So has been sleeping with 02 only.  - Needs CPAP mask with connector or to be shown how to connect 02. Will message [facility], who he states provides his CPAP.  5. Obesity - Needs to watch portions and increase activity as able.  6. Pulmonary hypertension- This appears to be primarily pulmonary venous hypertension on the  recent [place].  There may be a component of PAH related to OSA.   - No change to current plan. Treat OSA. Needs to get back on his CPAP.  7. Chronic respiratory failure - We have sent orders for portable 02. Pt states per [facility] they are waiting for information from his PCP. portable 02.  Pt did de-saturate walking into clinic but pulse ox > 93% on 02 2L.  - Will message [facility] to check If there is anything we can help with on our end.   Pressures high in the office with slightly improvement with rest.  New meds as above.  Labs today. Follow up 2 weeks with Pharmacist for med titration, 6 weeks with Provider.  Pt instructed to take BP at home and let office know if SBP > 160. Pt knows to seek emergency care if he has severe HA, unilateral weakness, blurred vision, or slurred speech.   Shirley Friar, PA-C  04/02/2016   Total time spent > 25 minutes. Over half that spent discussing the above.

## 2016-04-03 ENCOUNTER — Other Ambulatory Visit (HOSPITAL_COMMUNITY): Payer: Self-pay | Admitting: Pharmacist

## 2016-04-03 ENCOUNTER — Other Ambulatory Visit (HOSPITAL_COMMUNITY): Payer: Self-pay

## 2016-04-03 DIAGNOSIS — I5022 Chronic systolic (congestive) heart failure: Secondary | ICD-10-CM

## 2016-04-03 MED ORDER — SACUBITRIL-VALSARTAN 49-51 MG PO TABS: 1.0000 | ORAL_TABLET | Freq: Two times a day (BID) | ORAL | 11 refills | Status: DC

## 2016-04-14 ENCOUNTER — Telehealth (HOSPITAL_COMMUNITY): Payer: Self-pay | Admitting: Pharmacist

## 2016-04-14 NOTE — Telephone Encounter (Addendum)
[employer] patient assistance approved for Entresto 49-51 mg BID through 04/03/17.   ADDENDUM 06/08/16:  - Called today to verify shipping and was told that Mr. Raimondi is actually not approved yet. They were not able to verify Medicare information and need his effective date, zip code and ID. Mr. Fatheree did not have his Medicare A/B card with him today so will call with information.   Ruta Hinds. Velva Harman, PharmD, BCPS, CPP Clinical Pharmacist Pager: [phone] Phone: [phone] 04/14/2016 10:56 AM

## 2016-04-15 ENCOUNTER — Telehealth: Payer: Self-pay | Admitting: Physician Assistant

## 2016-04-15 NOTE — Telephone Encounter (Signed)
Paged by patient asking for Entresto samples, will forward to office to check supply. Patient of [name]  Signed, Almyra Deforest PA Pager: [phone]

## 2016-04-16 NOTE — Telephone Encounter (Signed)
Attempted to call patient.  No answer when dialed. His voice mail is not set up to receive messages.  I do have Entresto samples for the 49-51 which I have left at front desk of [facility] office for him.

## 2016-04-20 ENCOUNTER — Inpatient Hospital Stay (HOSPITAL_COMMUNITY): Admission: RE | Admit: 2016-04-20 | Payer: Medicare Other | Source: Ambulatory Visit

## 2016-04-29 ENCOUNTER — Other Ambulatory Visit: Payer: Self-pay | Admitting: Internal Medicine

## 2016-05-14 ENCOUNTER — Encounter (HOSPITAL_COMMUNITY): Payer: Medicare Other

## 2016-05-15 ENCOUNTER — Other Ambulatory Visit (HOSPITAL_COMMUNITY): Payer: Self-pay | Admitting: Internal Medicine

## 2016-06-08 ENCOUNTER — Ambulatory Visit (HOSPITAL_COMMUNITY)
Admission: RE | Admit: 2016-06-08 | Discharge: 2016-06-08 | Disposition: A | Discharge status: Home / Self Care | Payer: Medicare Other | Source: Ambulatory Visit | Attending: Cardiology | Admitting: Cardiology

## 2016-06-08 VITALS — BP 178/114 | HR 93 | Wt 218.6 lb

## 2016-06-08 DIAGNOSIS — N189 Chronic kidney disease, unspecified: Secondary | ICD-10-CM | POA: Diagnosis not present

## 2016-06-08 DIAGNOSIS — I130 Hypertensive heart and chronic kidney disease with heart failure and stage 1 through stage 4 chronic kidney disease, or unspecified chronic kidney disease: Secondary | ICD-10-CM | POA: Diagnosis not present

## 2016-06-08 DIAGNOSIS — I5022 Chronic systolic (congestive) heart failure: Secondary | ICD-10-CM

## 2016-06-08 DIAGNOSIS — J9611 Chronic respiratory failure with hypoxia: Secondary | ICD-10-CM

## 2016-06-08 DIAGNOSIS — Z79899 Other long term (current) drug therapy: Secondary | ICD-10-CM | POA: Insufficient documentation

## 2016-06-08 DIAGNOSIS — I272 Pulmonary hypertension, unspecified: Secondary | ICD-10-CM | POA: Diagnosis not present

## 2016-06-08 DIAGNOSIS — I429 Cardiomyopathy, unspecified: Secondary | ICD-10-CM | POA: Diagnosis not present

## 2016-06-08 DIAGNOSIS — Z993 Dependence on wheelchair: Secondary | ICD-10-CM | POA: Insufficient documentation

## 2016-06-08 DIAGNOSIS — I110 Hypertensive heart disease with heart failure: Secondary | ICD-10-CM

## 2016-06-08 DIAGNOSIS — G4733 Obstructive sleep apnea (adult) (pediatric): Secondary | ICD-10-CM

## 2016-06-08 DIAGNOSIS — Z87891 Personal history of nicotine dependence: Secondary | ICD-10-CM | POA: Insufficient documentation

## 2016-06-08 DIAGNOSIS — Z7982 Long term (current) use of aspirin: Secondary | ICD-10-CM | POA: Diagnosis not present

## 2016-06-08 DIAGNOSIS — I428 Other cardiomyopathies: Secondary | ICD-10-CM | POA: Diagnosis not present

## 2016-06-08 DIAGNOSIS — Q780 Osteogenesis imperfecta: Secondary | ICD-10-CM | POA: Diagnosis not present

## 2016-06-08 DIAGNOSIS — Z6841 Body Mass Index (BMI) 40.0 and over, adult: Secondary | ICD-10-CM | POA: Insufficient documentation

## 2016-06-08 DIAGNOSIS — J9620 Acute and chronic respiratory failure, unspecified whether with hypoxia or hypercapnia: Secondary | ICD-10-CM | POA: Diagnosis not present

## 2016-06-08 MED ORDER — HYDRALAZINE HCL 25 MG PO TABS: 37.5000 mg | ORAL_TABLET | Freq: Three times a day (TID) | ORAL | 3 refills | Status: DC

## 2016-06-08 MED ORDER — SACUBITRIL-VALSARTAN 49-51 MG PO TABS: 1.0000 | ORAL_TABLET | Freq: Two times a day (BID) | ORAL | 3 refills | Status: DC

## 2016-06-08 NOTE — Progress Notes (Signed)
Medication Samples have been provided to the patient.  Drug name: [name]     Strength: 49-51 mg      Qty: 28  LOT: [id number]  Exp.Date: 12/2017  Dosing instructions: Take 1 Tablet by mouth Two times Daily  The patient has been instructed regarding the correct time, dose, and frequency of taking this medication, including desired effects and most common side effects.   [name] 11:40 AM 06/08/2016

## 2016-06-08 NOTE — Patient Instructions (Signed)
START taking Entresto 49/51 mg (1 Tablet) two times daily  INCREASE Hydralazine to 37.5 mg (1.5 Tablets) Three times Daily  Follow up in 3 weeks with Doroteo Bradford, PharmD  Follow up with [name] in 3 Months

## 2016-06-08 NOTE — Progress Notes (Signed)
Patient ID: Jerry Edwards, male   DOB: [birth year], 46 y.o.   MRN: [id number]   [facility]     Primary Physician: Needs to establish.  Primary Cardiologist: [name] HF: [name]   HPI: Jerry Edwards is a 46 year old with history of NICM, Chronic systolic heart failure, HTN, OSA, and osteogenic imperfecta.   Admitted to [facility] 1/17 through 04/21/13 with increased dyspnea and mild volume overload. Question regarding possible infiltrative disease. ECHO 04/08/2013. EF 25%. cMRI 12/15 EF 38% possible infiltrative disease with discrete areas of non-coronary pattern delayed enhancement. Coronaries were normal on LHC.  SPEP no M spike and UPEP no free light chains. HIV negative and TSH nl.CT of chest no evidence of pulmonary infiltrates. D/C weight 193 pounds.   He was admitted in January 2017 with recurrent HF, severe HTN and hypoxemia. Diuresed and discharged home. Saw Dr. Acie Fredrickson a few weeks and was not taking Bidl because he couldn't afford. So given scripts for hydralazine and Imdur and now can afford. Also started on home O2 for sats in 70s.   Admitted 02/21/16 with acute respiratory failure. Treated with IV lasix but slowed down with creatinine rise. Also had hypertensive emergency on arrival requiring IV NTG. Likely due to non-compliance, as he had stopped several of his medications.    Pt presents today for regular follow up. Pt has been out of Entresto for 6 weeks despite being approved for [employer] patient assistance. Weight stable from last visit. Breathing has been stable. Nothing with ADLs. Continues to work at [employer].  Wants to go back to school for audio engineering for [employer] and video game music production. Denies peripheral edema or abdominal distention. No lightheadedness or CP. Did have some dizziness associated with ear "clogging" up, but now resolved.  Watching salt and fluid. Taking all other medications (apart from [place] and [place]) as directed.   Review of systems complete and found  to be negative unless listed in HPI.   Echo 1/17 30-35% RV ok  Echo 02/22/16 LVEF 40%, Grade 1 DD.    LHC/RHC 04/08/13  Normal coronaries RA pressure:  39/39 with a mean of 34. Prominent X. and Y. Descends.  RV pressure: 76/26  mean 4 PA pressure: 70/36 with a mean of 51  Pulmonary capillary wedge pressure: 31/30 and mean of 29  LV pressure: 140/26 with a left ventricular end-diastolic pressure of 35  PA sat 66%  AO sat 97%  CO/CI 4.94/2.63  Pulmonary vascular resistance: 4.45 Woods units   Labs (1/15): K 4.6, creatinine 1.25, TSH normal, UPEP negative, SPEP negative, transferrin saturation 39%, HIV negative, ACE level not elevated.  Labs 1/17: K 4.4 creatinine 0.89  PMH: 1. HTN 2. Osteogenesis imperfecta: wheelchair-bound.  History of bone fractures.  3. Suspect OSA 4. CKD 5. Nonischemic cardiomyopathy: Echo (1/15) with EF 20-25%, severe diffuse hypokinesis, mild Jerry, mildly enlarged RV with moderately decreased systolic function.  LHC/RHC (1/15) with normal coronaries, mean RA 34, PA 70/36 mean 51, mean PCWP 29, CI 2.63.  Cardiac MRI (1/15) with EF 38%, global hypokinesis, discrete areas of delayed enhancement in the subepicardial mid inferoseptal RV insertion site, the mid wall of the mid inferolateral wall, and the subendocardial mid anterolateral wall.  This pattern was suggestive of infiltrative disease.  UPEP/SPEP negative, HIV negative.  Chest CT to look for evidence of sarcoidosis did not show any sarcoid-type lesions.    SH:  Single, prior smoking now quit, occasional marijuana, works as Dispensing optician.   FH:  HTN,  no significant cardiac disease.   Current Outpatient Prescriptions  Medication Sig Dispense Refill  . aspirin 81 MG tablet Take 1 tablet (81 mg total) by mouth daily.    . carvedilol (COREG) 3.125 MG tablet Take 1 tablet (3.125 mg total) by mouth 2 (two) times daily with a meal. 60 tablet 3  . furosemide (LASIX) 40 MG tablet Take 40-80 mg by mouth 2 (two) times daily. Take  80 mg (2 tablets) in the morning and 40 mg (1 tablet) in the afternoon    . hydrALAZINE (APRESOLINE) 25 MG tablet Take 1 tablet (25 mg total) by mouth 3 (three) times daily. 270 tablet 3  . isosorbide mononitrate (IMDUR) 30 MG 24 hr tablet Take 0.5 tablets (15 mg total) by mouth daily. 45 tablet 3  . KLOR-CON M20 20 MEQ tablet TAKE TWO TABLETS BY MOUTH ONCE DAILY 60 tablet 3   No current facility-administered medications for this encounter.     PHYSICAL EXAM: Vitals:   06/08/16 1047  BP: (!) 178/114  Pulse: 93  SpO2: 90%  Weight: 218 lb 9.6 oz (99.2 kg)    Wt Readings from Last 3 Encounters:  06/08/16 218 lb 9.6 oz (99.2 kg)  04/02/16 217 lb (98.4 kg)  03/05/16 218 lb 6.4 oz (99.1 kg)     General: NAD. Small status. In [place]. NAD    HEENT: Normal Neck: supple. JVP does not appear elevated. Carotids. 2+ bilaterally; no bruits. No thyromegaly or nodule noted.  Cor: PMI normal. RRR. No M/G/R Lungs: Clear, normal effort Abdomen: soft, NT, ND, no HSM. No bruits or masses. +BS  Extremities: no cyanosis, clubbing, rash. Deformed due to osteogenesis imperfecta. No peripheral edema.   Neuro: A&Ox3, cranial nerves grossly intact. Moves all 4 extremities w/o difficulty. Affect very pleasant.   ASSESSMENT & PLAN: 1. Chronic Systolic Heart Failure. NICM EF 25% with no coronary disease on LHC in 1/15. 03/2013 cMRI possible infiltrative disease. SPEP and UPEP ok.  - EF 40-45% 02/22/16.   Based on the cMRI pattern and concern for cardiac sarcoidosis, a chest CT was done to look for evidence of pulmonary sarcoidosis.  This was not indicative of pulmonary sarcoidosis.  Patient has had no significant arrhythmias detected.  QRS on ECG is not widened.   - NYHA class II symptoms.  - Continue lasix 80 mg q am and 40 mg q pm. Can take extra 40 mg as needed.     - Continue coreg 3.25 mg BID for now.    - Resume entresto 49/51 mg BID. He has been approved for Patient assistance. Will provide samples in  clinic today to bridge. Check BMET in 14 days with Pharm-D follow up for med titration.   - Increase hydralazine 37.5 mg TID and imdur 15 mg daily.   - Reinforced fluid restriction to < 2 L daily, sodium restriction to less than 2000 mg daily, and the importance of daily weights.   - Echo 02/22/16 EF 40% -> Out of range for ICD consideration.   2. Osteogenesis imperfecta  - Wheel chair bound; limited mobility. No change to current plan.  3. HTN. - Resuming meds as above.  4. OSA  - Continue nightly CPAP with 02.  5. Morbid Obesity - Body mass index is 66.71 kg/m.  - Needs to watch portions and increase activity as able. No change to current plan.  6. Pulmonary hypertension- This appears to be primarily pulmonary venous hypertension on the recent RHC.  There may be a  component of PAH related to OSA.   - No change to current plan. Continue CPAP.   7. Chronic respiratory failure - Continue 02.  - We have sent orders for portable 02, but per pt needs to come from PCP, and he is in between providers at this time.  He has stated he will follow up ASAP.   Resume Entresto and increase hydralazine.  Follow up 2-3 weeks with Pharm D and 3 months with MD  Shirley Friar, PA-C  06/08/2016   Greater than 50% of the 25 minute visit was spent in counseling/coordination of care regarding acquiring his medication, sliding scale diuretics, and importance of medication compliance.

## 2016-06-24 ENCOUNTER — Other Ambulatory Visit (HOSPITAL_COMMUNITY): Payer: Self-pay | Admitting: Cardiology

## 2016-06-24 ENCOUNTER — Telehealth (HOSPITAL_COMMUNITY): Payer: Self-pay | Admitting: *Deleted

## 2016-06-24 MED ORDER — CARVEDILOL 3.125 MG PO TABS: 3.1250 mg | ORAL_TABLET | Freq: Two times a day (BID) | ORAL | 1 refills | Status: DC

## 2016-06-24 NOTE — Telephone Encounter (Signed)
Pt called in asking for more Entresto samples.  Stated he had notified for a new medicare a/b card so that [employer] can approve him but it has not come in the mail yet.    Leaving 2 weeks of Entresto 49-51mg  for patient to pickup today.   Medication Samples have been provided to the patient.  Drug name: [name]       Strength: 49-51 mg       Qty: 2 bottles  LOT: [id number] Exp.Date: 10/19  Dosing instructions: Take 1 Tablet Two times Daily  The patient has been instructed regarding the correct time, dose, and frequency of taking this medication, including desired effects and most common side effects.   Elige Ko S 1:48 PM 06/24/2016

## 2016-06-26 ENCOUNTER — Other Ambulatory Visit (HOSPITAL_COMMUNITY): Payer: Self-pay | Admitting: Cardiology

## 2016-06-26 MED ORDER — CARVEDILOL 3.125 MG PO TABS: 3.1250 mg | ORAL_TABLET | Freq: Two times a day (BID) | ORAL | 1 refills | Status: DC

## 2016-06-29 ENCOUNTER — Encounter (HOSPITAL_COMMUNITY): Payer: Self-pay

## 2016-06-29 ENCOUNTER — Ambulatory Visit (HOSPITAL_COMMUNITY)
Admission: RE | Admit: 2016-06-29 | Discharge: 2016-06-29 | Disposition: A | Discharge status: Home / Self Care | Payer: Medicare Other | Source: Ambulatory Visit | Attending: Cardiology | Admitting: Cardiology

## 2016-06-29 VITALS — BP 158/92 | HR 83 | Wt 216.0 lb

## 2016-06-29 DIAGNOSIS — Q780 Osteogenesis imperfecta: Secondary | ICD-10-CM | POA: Diagnosis not present

## 2016-06-29 DIAGNOSIS — I5022 Chronic systolic (congestive) heart failure: Secondary | ICD-10-CM | POA: Diagnosis not present

## 2016-06-29 DIAGNOSIS — I272 Pulmonary hypertension, unspecified: Secondary | ICD-10-CM | POA: Diagnosis not present

## 2016-06-29 DIAGNOSIS — G4733 Obstructive sleep apnea (adult) (pediatric): Secondary | ICD-10-CM | POA: Insufficient documentation

## 2016-06-29 DIAGNOSIS — I110 Hypertensive heart disease with heart failure: Secondary | ICD-10-CM | POA: Diagnosis not present

## 2016-06-29 DIAGNOSIS — Z6841 Body Mass Index (BMI) 40.0 and over, adult: Secondary | ICD-10-CM | POA: Diagnosis not present

## 2016-06-29 DIAGNOSIS — Z9981 Dependence on supplemental oxygen: Secondary | ICD-10-CM | POA: Diagnosis not present

## 2016-06-29 DIAGNOSIS — J9610 Chronic respiratory failure, unspecified whether with hypoxia or hypercapnia: Secondary | ICD-10-CM | POA: Diagnosis not present

## 2016-06-29 DIAGNOSIS — I428 Other cardiomyopathies: Secondary | ICD-10-CM

## 2016-06-29 LAB — BASIC METABOLIC PANEL
Anion gap: 6 (ref 5–15)
BUN: 11 mg/dL (ref 6–20)
CHLORIDE: 103 mmol/L (ref 101–111)
CO2: 34 mmol/L — ABNORMAL HIGH (ref 22–32)
Calcium: 8.8 mg/dL — ABNORMAL LOW (ref 8.9–10.3)
Creatinine, Ser: 0.87 mg/dL (ref 0.61–1.24)
GFR calc Af Amer: >60 mL/min (ref >60)
GFR calc non Af Amer: >60 mL/min (ref >60)
GLUCOSE: 96 mg/dL (ref 65–99)
POTASSIUM: 3.8 mmol/L (ref 3.5–5.1)
Sodium: 143 mmol/L (ref 135–145)

## 2016-06-29 MED ORDER — HYDRALAZINE HCL 25 MG PO TABS: 50.0000 mg | ORAL_TABLET | Freq: Three times a day (TID) | ORAL | 3 refills | Status: DC

## 2016-06-29 MED ORDER — CARVEDILOL 6.25 MG PO TABS: 3.1250 mg | ORAL_TABLET | Freq: Two times a day (BID) | ORAL | 5 refills | Status: DC

## 2016-06-29 MED ORDER — ISOSORBIDE MONONITRATE ER 30 MG PO TB24: 30.0000 mg | ORAL_TABLET | Freq: Every day | ORAL | 3 refills | Status: DC

## 2016-06-29 NOTE — Patient Instructions (Addendum)
It was great to see you today.  Please increase your carvedilol (Coreg) from 3.125mg  twice a day to 6.25mg  twice a day. If you have some of the 3.125mg  left, then you can take 2 of the tablets to equal the 6.25mg  dose twice a day. When you run out, a new prescription for the 6.25mg  dose will be ready at your pharmacy.  Please increase your hydralazine from 37.5mg  (1.5 tablets) three times a day to 50mg  (2 tablets)  three times a day.  Please increase your Imdur from 15mg  every day (0.5 tablets) to 30mg  every day.  Labs today, we will call you if there are any issues  Follow up with pharmacy in 1 month.

## 2016-06-29 NOTE — Progress Notes (Signed)
HF MD: Jerry Edwards  HPI:  Jerry Edwards is a 46 year old AA male with history of NICM, Chronic systolic heart failure, HTN, OSA, and osteogenic imperfecta.   Admitted to [facility] 1/17 through 04/21/13 with increased dyspnea and mild volume overload. Question regarding possible infiltrative disease. ECHO 04/08/2013. EF 25%. cMRI 12/15 EF 38% possible infiltrative disease with discrete areas of non-coronary pattern delayed enhancement. Coronaries were normal on LHC.  SPEP no M spike and UPEP no free light chains. HIV negative and TSH nl.CT of chest no evidence of pulmonary infiltrates. D/C weight 193 pounds.   He was admitted in January 2017 with recurrent HF, severe HTN and hypoxemia. Diuresed and discharged home. Saw Dr. Acie Fredrickson a few weeks and was not taking Bidl because he couldn't afford. So given scripts for hydralazine and Imdur and now can afford. Also started on home O2 for sats in 70s.   Admitted 02/21/16 with acute respiratory failure. Treated with IV lasix but slowed down with creatinine rise. Also had hypertensive emergency on arrival requiring IV NTG. Likely due to non-compliance, as he had stopped several of his medications.    He presents today for pharmacist lead HF medication titration. A last visit on 06/08/16, patient had ran out of [place]. BP high on arrival to clinic, his Entresto 49-51 mg BID was resumed and hydralazine was increased from 25mg  TID to 37.5mg  TID. Today his blood pressure is still elevated although improved and O2 sats were in the low 80s > improved to 86% with rest. Overall had no complaints, and did increase his hydralazine and start his Entresto as instructed from previous visit. Still working 3 nights a week at [employer]. Has not needed any extra lasix.   Marland Kitchen Shortness of breath/dyspnea on exertion? no  . Orthopnea/PND? no . Edema? no . Lightheadedness/dizziness? no . Daily weights at home? Yes - stable ~215 lb  . Blood pressure/heart rate monitoring at home? Yes - SBPs  130-150s . Following low-sodium/fluid-restricted diet? yes  HF Medications: Entresto 49-51 mg BID Coreg 3.125mg  BID Hydralazine 37.5mg  TID Imdur 15mg  daily Furosemide 80mg  in the AM and 40mg  in the PM Potassium 71mEq daily (41mEq tablets)  Has the patient been experiencing any side effects to the medications prescribed?  no  Does the patient have any problems obtaining medications due to transportation or finances?   No - no Rx insurance (Medicare A/B), working on [employer] patient assistance for [employer] of regimen: good Understanding of indications: excellent Potential of compliance: good Patient understands to avoid NSAIDs. Patient understands to avoid decongestants.    Pertinent Lab Values: . 06/29/16: Serum creatinine 0.87, BUN 11, Potassium 3.8, Sodium 143 . 04/02/16: BNP 384.3  Vital Signs: . Weight: 216 lbs (dry weight: 215 lbs) . Blood pressure: 158/92 mmHg  . Heart rate: 83 bpm   Assessment: 1. Chronicsystolic CHF (EF 51-02%), due to NICM. NYHA class IIsymptoms. - Volume status stable, continue lasix 80mg  in the AM and 40mg  in the PM + 69mEq KCl. Can take extra 40 mg lasix as needed. - Increase Carvedilol to 6.25 mg BID, hydralazine to 50 mg TID, and Imdur to 30mg  daily. - Continue Entresto 49-51 mg BID - Blood pressure still elevated. Consider addition of spironolactone after titration of other medications. - Reinforced fluid restriction to < 2 L daily, sodium restriction to less than 2000 mg daily, and the importance of daily weights.   - Basic disease state pathophysiology, medication indication, mechanism and side effects reviewed at length  with patient and he verbalized understanding 2. Osteogenesis imperfecta  - Wheel chair bound 3. HTN - Increase medications as noted above.  4. OSA  - Continue nightly CPAP with 02  5. Morbid Obesity - Body mass index is 66.71 kg/m.  - Needs to watch portions and increase activity as able. No change to  current plan.  6. Pulmonary hypertension- This appears to be primarily pulmonary venous hypertension on the recent RHC.  There may be a component of PAH related to OSA.   - No change to current plan. Continue CPAP.   7. Chronic respiratory failure - Continue home O2  - We have sent orders for portable 02, but per pt needs to come from PCP, and he is in between providers at this time.  He was not able to make a new patient appointment with the list of PCPs that Kennyth Lose provided him. Will send a message to Kennyth Lose to follow up.  Plan: 1) Medication changes: Based on clinical presentation, vital signs and recent labs will increase Coreg to 6.25mg  BID,  hydralazine to 50 mg TID, and Imdur to 30mg  daily. Continue Entresto 49/51 BID and lasix 80mg  in the AM, 40mg  in the PM + KCl 2mEq. 2) Labs: BMET 3) Follow-up: with pharmacy on 07/27/16 for medication titration.  Cruz Condon, PharmD, [place] PGY2 Pharmacy Resident   Ruta Hinds. Velva Harman, PharmD, BCPS, CPP Clinical Pharmacist Pager: [phone] Phone: [phone] 06/29/2016 2:15 PM   Agree with above.  Glori Bickers, MD  12:35 PM

## 2016-07-06 ENCOUNTER — Telehealth: Payer: Self-pay | Admitting: Licensed Clinical Social Worker

## 2016-07-06 NOTE — Telephone Encounter (Signed)
CSW referred to assist patient with PCP options. CSW attempted to reach patient although number listed in chart unavailable. CSW left message for return call on home number. Raquel Sarna, [place], [place]

## 2016-07-22 ENCOUNTER — Other Ambulatory Visit: Payer: Self-pay | Admitting: Internal Medicine

## 2016-07-27 ENCOUNTER — Inpatient Hospital Stay (HOSPITAL_COMMUNITY): Admission: RE | Admit: 2016-07-27 | Payer: Medicare Other | Source: Ambulatory Visit

## 2016-08-18 ENCOUNTER — Telehealth (HOSPITAL_COMMUNITY): Payer: Self-pay | Admitting: *Deleted

## 2016-08-18 NOTE — Telephone Encounter (Signed)
Pt called requesting samples of Entresto, he states he is out of medication and has not been approved by [employer] for assit yet.  Upon further discussion with pt he states they were just waiting on his part D insurance to go through and he reports it is now active and he has a card.  Advised samples will be left a front desk for him to p/u, he needs to leave a copy of his part D at front desk for [place].  He is agreeable.  Medication Samples have been provided to the patient.  Drug name: [name]       Strength: 49/51mg         Qty: 2  LOT: [id number]  Exp.Date: 10/19  Dosing instructions: 1 tab Twice daily   The patient has been instructed regarding the correct time, dose, and frequency of taking this medication, including desired effects and most common side effects.   Sherika Kubicki 1:37 PM 08/18/2016

## 2016-08-28 ENCOUNTER — Telehealth (HOSPITAL_COMMUNITY): Payer: Self-pay | Admitting: Pharmacist

## 2016-08-28 NOTE — Telephone Encounter (Signed)
[employer] patient assistance approved for Entresto 49-51 mg BID through 08/27/17.   Ruta Hinds. Velva Harman, PharmD, BCPS, CPP Clinical Pharmacist Pager: [phone] Phone: [phone] 08/28/2016 12:42 PM

## 2016-09-03 ENCOUNTER — Telehealth (HOSPITAL_COMMUNITY): Payer: Self-pay | Admitting: *Deleted

## 2016-09-03 NOTE — Telephone Encounter (Signed)
Pt called and report he just got approved to get Entresto from the company but he has not received it in the mail yet and is out.  Samples left at front desk for pt to p/u.  Medication Samples have been provided to the patient.  Drug name: [name]       Strength: 49/51mg         Qty: 2  LOT: [id number]  Exp.Date: 10/19  Dosing instructions: 1 tab Twice daily   The patient has been instructed regarding the correct time, dose, and frequency of taking this medication, including desired effects and most common side effects.   Dylann Layne 10:40 AM 09/03/2016

## 2016-09-08 ENCOUNTER — Ambulatory Visit (HOSPITAL_COMMUNITY)
Admission: RE | Admit: 2016-09-08 | Discharge: 2016-09-08 | Disposition: A | Discharge status: Home / Self Care | Payer: Medicare Other | Source: Ambulatory Visit | Attending: Internal Medicine | Admitting: Internal Medicine

## 2016-09-08 ENCOUNTER — Encounter (HOSPITAL_COMMUNITY): Payer: Self-pay | Admitting: Internal Medicine

## 2016-09-08 ENCOUNTER — Other Ambulatory Visit: Payer: Self-pay | Admitting: Internal Medicine

## 2016-09-08 VITALS — BP 158/78 | HR 95 | Wt 225.5 lb

## 2016-09-08 DIAGNOSIS — Z993 Dependence on wheelchair: Secondary | ICD-10-CM | POA: Diagnosis not present

## 2016-09-08 DIAGNOSIS — I130 Hypertensive heart and chronic kidney disease with heart failure and stage 1 through stage 4 chronic kidney disease, or unspecified chronic kidney disease: Secondary | ICD-10-CM | POA: Insufficient documentation

## 2016-09-08 DIAGNOSIS — J9620 Acute and chronic respiratory failure, unspecified whether with hypoxia or hypercapnia: Secondary | ICD-10-CM | POA: Diagnosis not present

## 2016-09-08 DIAGNOSIS — I429 Cardiomyopathy, unspecified: Secondary | ICD-10-CM | POA: Insufficient documentation

## 2016-09-08 DIAGNOSIS — Z7982 Long term (current) use of aspirin: Secondary | ICD-10-CM | POA: Insufficient documentation

## 2016-09-08 DIAGNOSIS — I272 Pulmonary hypertension, unspecified: Secondary | ICD-10-CM | POA: Diagnosis not present

## 2016-09-08 DIAGNOSIS — Z79899 Other long term (current) drug therapy: Secondary | ICD-10-CM | POA: Diagnosis not present

## 2016-09-08 DIAGNOSIS — I5022 Chronic systolic (congestive) heart failure: Secondary | ICD-10-CM | POA: Insufficient documentation

## 2016-09-08 DIAGNOSIS — G4733 Obstructive sleep apnea (adult) (pediatric): Secondary | ICD-10-CM | POA: Diagnosis not present

## 2016-09-08 DIAGNOSIS — N189 Chronic kidney disease, unspecified: Secondary | ICD-10-CM | POA: Diagnosis not present

## 2016-09-08 DIAGNOSIS — Z6841 Body Mass Index (BMI) 40.0 and over, adult: Secondary | ICD-10-CM | POA: Diagnosis not present

## 2016-09-08 MED ORDER — SACUBITRIL-VALSARTAN 97-103 MG PO TABS: 1.0000 | ORAL_TABLET | Freq: Two times a day (BID) | ORAL | 3 refills | Status: DC

## 2016-09-08 NOTE — Patient Instructions (Addendum)
INCREASE Lasix to 80 mg (2 Tabs) Two Times Daily for 2 days only.  Then resume previous dose.  INCREASE Entresto to 97-103 mg (1 Tablet) Two Times Daily.  Cardiac MRI has been ordered for you, we will schedule this for you at checkout.   Follow up in 2 Months

## 2016-09-08 NOTE — Progress Notes (Signed)
Patient ID: Jerry Edwards, male   DOB: 05-09-1970, 46 y.o.   MRN: [id number]   [facility]     Primary Physician: Needs to establish.  Primary Cardiologist: [name] HF: [name]   HPI: Jerry Edwards is a 46 year old with history of NICM, Chronic systolic heart failure, HTN, OSA, and osteogenic imperfecta.   Admitted to [facility] 1/17 through 04/21/13 with increased dyspnea and mild volume overload. Question regarding possible infiltrative disease. ECHO 04/08/2013. EF 25%. cMRI 12/15 EF 38% possible infiltrative disease with discrete areas of non-coronary pattern delayed enhancement. Coronaries were normal on LHC.  SPEP no M spike and UPEP no free light chains. HIV negative and TSH nl.CT of chest no evidence of pulmonary infiltrates. D/C weight 193 pounds.   He was admitted in January 2017 with recurrent HF, severe HTN and hypoxemia. Diuresed and discharged home. Saw Dr. Acie Fredrickson a few weeks and was not taking Bidl because he couldn't afford. So given scripts for hydralazine and Imdur and now can afford. Also started on home O2 for sats in 70s.   Admitted 02/21/16 with acute respiratory failure. Treated with IV lasix but slowed down with creatinine rise. Also had hypertensive emergency on arrival requiring IV NTG. Likely due to non-compliance, as he had stopped several of his medications.    Pt presents today for regular follow up. Recently restarted Entresto on 49/51. Feels like he has more energy. Weight up 7-8 pounds.  Breathing has been stable. Wears 2L O2. Continues to work at [employer].  Stays active with chasing kids around the house. No orthopnea or PND.   Review of systems complete and found to be negative unless listed in HPI.   Echo 1/17 30-35% RV ok  Echo 02/22/16 LVEF 40%, Grade 1 DD.    LHC/RHC 04/08/13  Normal coronaries RA pressure:  39/39 with a mean of 34. Prominent X. and Y. Descends.  RV pressure: 76/26  mean 4 PA pressure: 70/36 with a mean of 51  Pulmonary capillary  wedge pressure: 31/30 and mean of 29  LV pressure: 140/26 with a left ventricular end-diastolic pressure of 35  PA sat 66%  AO sat 97%  CO/CI 4.94/2.63  Pulmonary vascular resistance: 4.45 Woods units   Labs (1/15): K 4.6, creatinine 1.25, TSH normal, UPEP negative, SPEP negative, transferrin saturation 39%, HIV negative, ACE level not elevated.  Labs 1/17: K 4.4 creatinine 0.89  PMH: 1. HTN 2. Osteogenesis imperfecta: wheelchair-bound.  History of bone fractures.  3. Suspect OSA 4. CKD 5. Nonischemic cardiomyopathy: Echo (1/15) with EF 20-25%, severe diffuse hypokinesis, mild Jerry, mildly enlarged RV with moderately decreased systolic function.  LHC/RHC (1/15) with normal coronaries, mean RA 34, PA 70/36 mean 51, mean PCWP 29, CI 2.63.  Cardiac MRI (1/15) with EF 38%, global hypokinesis, discrete areas of delayed enhancement in the subepicardial mid inferoseptal RV insertion site, the mid wall of the mid inferolateral wall, and the subendocardial mid anterolateral wall.  This pattern was suggestive of infiltrative disease.  UPEP/SPEP negative, HIV negative.  Chest CT to look for evidence of sarcoidosis did not show any sarcoid-type lesions.    SH:  Single, prior smoking now quit, occasional marijuana, works as Dispensing optician.   FH:  HTN, no significant cardiac disease.   Current Outpatient Prescriptions  Medication Sig Dispense Refill  . aspirin 81 MG tablet Take 1 tablet (81 mg total) by mouth daily.    . carvedilol (COREG) 6.25 MG tablet Take 0.5 tablets (3.125 mg total) by  mouth 2 (two) times daily with a meal. 30 tablet 5  . furosemide (LASIX) 40 MG tablet TAKE TWO TABLETS BY MOUTH IN THE MORNING AND TAKE ONE TABLETS BY MOUTH IN THE EVENING 90 tablet 3  . hydrALAZINE (APRESOLINE) 25 MG tablet Take 2 tablets (50 mg total) by mouth 3 (three) times daily. 135 tablet 3  . isosorbide mononitrate (IMDUR) 30 MG 24 hr tablet Take 1 tablet (30 mg total) by mouth daily. 45 tablet 3  . KLOR-CON M20 20 MEQ  tablet TAKE TWO TABLETS BY MOUTH ONCE DAILY 60 tablet 3  . sacubitril-valsartan (ENTRESTO) 49-51 MG Take 1 tablet by mouth 2 (two) times daily. 60 tablet 3   No current facility-administered medications for this encounter.     PHYSICAL EXAM: Vitals:   09/08/16 1022  BP: (!) 158/78  Pulse: 95  SpO2: 96%  Weight: 225 lb 8 oz (102.3 kg)    Wt Readings from Last 3 Encounters:  09/08/16 225 lb 8 oz (102.3 kg)  06/29/16 216 lb (98 kg)  06/08/16 218 lb 9.6 oz (99.2 kg)     General: NAD. Small stature. In [place]. NAD    HEENT: Normal Neck: supple. JVP hard to see  Carotids. 2+ bilaterally; no bruits. No thyromegaly or nodule noted.  Cor: PMI normal. RRR No m/r/g Lungs: Clear, decreased breath sounds  Abdomen: obese soft NT. Mildly distended No bruits or masses. +BS  Extremities: no cyanosis, clubbing, rash. Deformed due to osteogenesis imperfecta. Warm no edema Neuro: alert & oriented x 3, cranial nerves grossly intact. moves all 4 extremities w/o difficulty. Affect pleasant   ASSESSMENT & PLAN: 1. Chronic Systolic Heart Failure. NICM EF 25% with no coronary disease on LHC in 1/15. 03/2013 cMRI possible infiltrative disease. SPEP and UPEP ok.  - EF 40-45% 02/22/16.  - Based on the cMRI pattern 12/15 and concern for cardiac sarcoidosis, a chest CT was done to look for evidence of pulmonary sarcoidosis.  This was not indicative of pulmonary sarcoidosis.  Patient has had no significant arrhythmias detected.  QRS on ECG is not widened.   - Will repeat cMRI (if he fits) to look for progression. If progressive consider TPY scan for TTR amyloif - NYHA class II symptoms.  - Increase  lasix to 80 bid tfor 2 days then back to 80/40  - Continue coreg 3.25 mg BID for now.    - Increaseentresto 97/103 mg BID. He has been approved for Patient assistance. Will provide samples in clinic today to bridge. Check BMET in 14 days with Pharm-D follow up for med titration.   - Continue hydralazine 50 mg TID and  imdur 30 mg daily.   - Reinforced fluid restriction to < 2 L daily, sodium restriction to less than 2000 mg daily, and the importance of daily weights.   - Echo 02/22/16 EF 40% -> Out of range for ICD consideration.   2. Osteogenesis imperfecta  - Wheel chair bound; limited mobility. No change to current plan.  3. HTN. - Poorly controlled. ? Full medication compliance. Increase Entresto to 97/103 4. OSA  - Continue nightly CPAP with 02.  5. Morbid Obesity - Body mass index is 68.81 kg/m.  - Needs to watch portions and increase activity as able. No change to current plan.  6. Pulmonary hypertension- This appears to be primarily pulmonary venous hypertension on previousRHC.  There may be a component of PAH related to OSA.   - No change to current plan. Continue CPAP.  7. Chronic respiratory failure - Continue 02.    Glori Bickers, MD  09/08/2016

## 2016-09-08 NOTE — Progress Notes (Signed)
Message sent to [facility] to schedule repeat CMRI. No pre cert required (Medicare).

## 2016-09-10 ENCOUNTER — Telehealth: Payer: Self-pay | Admitting: Internal Medicine

## 2016-09-10 ENCOUNTER — Encounter: Payer: Self-pay | Admitting: Internal Medicine

## 2016-09-10 NOTE — Telephone Encounter (Signed)
Called patient to give him appointment date and time for cardiac MRI, but, his VM was not set up so could not leave a message.  Mailed letter to the patient and message to the nurse.  Letter and calendar mailed out today.

## 2016-09-30 ENCOUNTER — Ambulatory Visit (HOSPITAL_COMMUNITY)
Admission: RE | Admit: 2016-09-30 | Discharge: 2016-09-30 | Disposition: A | Discharge status: Home / Self Care | Payer: Medicare Other | Source: Ambulatory Visit | Attending: Internal Medicine | Admitting: Internal Medicine

## 2016-09-30 DIAGNOSIS — I5022 Chronic systolic (congestive) heart failure: Secondary | ICD-10-CM

## 2016-10-08 ENCOUNTER — Other Ambulatory Visit (HOSPITAL_COMMUNITY): Payer: Self-pay | Admitting: Internal Medicine

## 2016-10-12 ENCOUNTER — Emergency Department (HOSPITAL_COMMUNITY): Payer: Medicare Other

## 2016-10-12 ENCOUNTER — Inpatient Hospital Stay (HOSPITAL_COMMUNITY)
Admission: EM | Admit: 2016-10-12 | Discharge: 2016-10-15 | DRG: 292 | Disposition: A | Discharge status: Home / Self Care | Payer: Medicare Other | Attending: Internal Medicine | Admitting: Internal Medicine

## 2016-10-12 ENCOUNTER — Encounter (HOSPITAL_COMMUNITY): Payer: Self-pay | Admitting: Emergency Medicine

## 2016-10-12 DIAGNOSIS — I428 Other cardiomyopathies: Secondary | ICD-10-CM | POA: Diagnosis present

## 2016-10-12 DIAGNOSIS — Z79899 Other long term (current) drug therapy: Secondary | ICD-10-CM

## 2016-10-12 DIAGNOSIS — J9611 Chronic respiratory failure with hypoxia: Secondary | ICD-10-CM | POA: Diagnosis present

## 2016-10-12 DIAGNOSIS — I110 Hypertensive heart disease with heart failure: Principal | ICD-10-CM | POA: Diagnosis present

## 2016-10-12 DIAGNOSIS — R9431 Abnormal electrocardiogram [ECG] [EKG]: Secondary | ICD-10-CM

## 2016-10-12 DIAGNOSIS — R0789 Other chest pain: Secondary | ICD-10-CM | POA: Diagnosis not present

## 2016-10-12 DIAGNOSIS — I5043 Acute on chronic combined systolic (congestive) and diastolic (congestive) heart failure: Secondary | ICD-10-CM | POA: Diagnosis present

## 2016-10-12 DIAGNOSIS — R079 Chest pain, unspecified: Secondary | ICD-10-CM

## 2016-10-12 DIAGNOSIS — Z993 Dependence on wheelchair: Secondary | ICD-10-CM | POA: Diagnosis not present

## 2016-10-12 DIAGNOSIS — I340 Nonrheumatic mitral (valve) insufficiency: Secondary | ICD-10-CM | POA: Diagnosis not present

## 2016-10-12 DIAGNOSIS — Z6841 Body Mass Index (BMI) 40.0 and over, adult: Secondary | ICD-10-CM

## 2016-10-12 DIAGNOSIS — Z87891 Personal history of nicotine dependence: Secondary | ICD-10-CM | POA: Diagnosis not present

## 2016-10-12 DIAGNOSIS — Z7982 Long term (current) use of aspirin: Secondary | ICD-10-CM | POA: Diagnosis not present

## 2016-10-12 DIAGNOSIS — Q780 Osteogenesis imperfecta: Secondary | ICD-10-CM

## 2016-10-12 DIAGNOSIS — M549 Dorsalgia, unspecified: Secondary | ICD-10-CM | POA: Diagnosis not present

## 2016-10-12 DIAGNOSIS — G4733 Obstructive sleep apnea (adult) (pediatric): Secondary | ICD-10-CM | POA: Diagnosis present

## 2016-10-12 DIAGNOSIS — R748 Abnormal levels of other serum enzymes: Secondary | ICD-10-CM | POA: Diagnosis not present

## 2016-10-12 DIAGNOSIS — I5023 Acute on chronic systolic (congestive) heart failure: Secondary | ICD-10-CM | POA: Diagnosis present

## 2016-10-12 DIAGNOSIS — Z9981 Dependence on supplemental oxygen: Secondary | ICD-10-CM

## 2016-10-12 LAB — BRAIN NATRIURETIC PEPTIDE: B Natriuretic Peptide: 162.8 pg/mL — ABNORMAL HIGH (ref 0.0–100.0)

## 2016-10-12 LAB — CBC
HCT: 46.3 % (ref 39.0–52.0)
HEMATOCRIT: 47.4 % (ref 39.0–52.0)
HEMOGLOBIN: 14.1 g/dL (ref 13.0–17.0)
Hemoglobin: 14.4 g/dL (ref 13.0–17.0)
MCH: 29.1 pg (ref 26.0–34.0)
MCH: 29.2 pg (ref 26.0–34.0)
MCHC: 30.4 g/dL (ref 30.0–36.0)
MCHC: 30.5 g/dL (ref 30.0–36.0)
MCV: 96 fL (ref 78.0–100.0)
MCV: 95.9 fL (ref 78.0–100.0)
PLATELETS: 242 10*3/uL (ref 150–400)
Platelets: 243 10*3/uL (ref 150–400)
RBC: 4.94 MIL/uL (ref 4.22–5.81)
RBC: 4.83 MIL/uL (ref 4.22–5.81)
RDW: 11.9 % (ref 11.5–15.5)
RDW: 11.9 % (ref 11.5–15.5)
WBC: 7.8 10*3/uL (ref 4.0–10.5)
WBC: 7.8 10*3/uL (ref 4.0–10.5)

## 2016-10-12 LAB — BASIC METABOLIC PANEL
Anion gap: 7 (ref 5–15)
BUN: 15 mg/dL (ref 6–20)
CALCIUM: 9 mg/dL (ref 8.9–10.3)
CO2: 33 mmol/L — ABNORMAL HIGH (ref 22–32)
CREATININE: 0.99 mg/dL (ref 0.61–1.24)
Chloride: 102 mmol/L (ref 101–111)
GFR calc Af Amer: >60 mL/min (ref >60)
GLUCOSE: 143 mg/dL — AB (ref 65–99)
Potassium: 3.5 mmol/L (ref 3.5–5.1)
SODIUM: 142 mmol/L (ref 135–145)

## 2016-10-12 LAB — POCT I-STAT TROPONIN I: Troponin i, poc: 0.07 ng/mL (ref 0.00–0.08)

## 2016-10-12 LAB — CREATININE, SERUM
CREATININE: 1 mg/dL (ref 0.61–1.24)
GFR calc Af Amer: >60 mL/min (ref >60)

## 2016-10-12 LAB — TROPONIN I: TROPONIN I: 0.12 ng/mL — AB (ref <0.03)

## 2016-10-12 LAB — I-STAT TROPONIN, ED: TROPONIN I, POC: 0 ng/mL (ref 0.00–0.08)

## 2016-10-12 LAB — TSH: TSH: 0.824 u[IU]/mL (ref 0.350–4.500)

## 2016-10-12 MED ORDER — HYDRALAZINE HCL 50 MG PO TABS
50.0000 mg | ORAL_TABLET | Freq: Three times a day (TID) | ORAL | Status: DC
  Administered 2016-10-12 – 2016-10-15 (×9): 50 mg via ORAL
  Filled 2016-10-12 (×9): qty 1

## 2016-10-12 MED ORDER — POTASSIUM CHLORIDE CRYS ER 20 MEQ PO TBCR
40.0000 meq | EXTENDED_RELEASE_TABLET | Freq: Every day | ORAL | Status: DC
  Administered 2016-10-13 – 2016-10-15 (×3): 40 meq via ORAL
  Filled 2016-10-12 (×3): qty 2

## 2016-10-12 MED ORDER — ENOXAPARIN SODIUM 60 MG/0.6ML ~~LOC~~ SOLN
0.5000 mg/kg | SUBCUTANEOUS | Status: DC
  Administered 2016-10-12 – 2016-10-14 (×3): 50 mg via SUBCUTANEOUS
  Filled 2016-10-12 (×3): qty 0.6

## 2016-10-12 MED ORDER — HYDROCODONE-ACETAMINOPHEN 5-325 MG PO TABS: 1.0000 | ORAL_TABLET | ORAL | Status: DC | PRN

## 2016-10-12 MED ORDER — SACUBITRIL-VALSARTAN 97-103 MG PO TABS
1.0000 | ORAL_TABLET | Freq: Two times a day (BID) | ORAL | Status: DC
  Administered 2016-10-12 – 2016-10-15 (×6): 1 via ORAL
  Filled 2016-10-12 (×7): qty 1

## 2016-10-12 MED ORDER — CARVEDILOL 3.125 MG PO TABS
3.1250 mg | ORAL_TABLET | Freq: Two times a day (BID) | ORAL | Status: DC
  Administered 2016-10-13 – 2016-10-15 (×5): 3.125 mg via ORAL
  Filled 2016-10-12 (×6): qty 1

## 2016-10-12 MED ORDER — ONDANSETRON HCL 4 MG/2ML IJ SOLN: 4.0000 mg | Freq: Four times a day (QID) | INTRAMUSCULAR | Status: DC | PRN

## 2016-10-12 MED ORDER — ASPIRIN EC 81 MG PO TBEC
81.0000 mg | DELAYED_RELEASE_TABLET | Freq: Every day | ORAL | Status: DC
  Administered 2016-10-13 – 2016-10-15 (×3): 81 mg via ORAL
  Filled 2016-10-12 (×3): qty 1

## 2016-10-12 MED ORDER — GI COCKTAIL ~~LOC~~
30.0000 mL | ORAL | Status: AC | PRN
  Administered 2016-10-12: 30 mL via ORAL
  Filled 2016-10-12: qty 30

## 2016-10-12 MED ORDER — ISOSORBIDE MONONITRATE ER 30 MG PO TB24
30.0000 mg | ORAL_TABLET | Freq: Every day | ORAL | Status: DC
  Administered 2016-10-13 – 2016-10-15 (×3): 30 mg via ORAL
  Filled 2016-10-12 (×3): qty 1

## 2016-10-12 MED ORDER — ONDANSETRON HCL 4 MG PO TABS: 4.0000 mg | ORAL_TABLET | Freq: Four times a day (QID) | ORAL | Status: DC | PRN

## 2016-10-12 MED ORDER — FUROSEMIDE 40 MG PO TABS
80.0000 mg | ORAL_TABLET | Freq: Two times a day (BID) | ORAL | Status: DC
  Administered 2016-10-12 – 2016-10-13 (×2): 80 mg via ORAL
  Filled 2016-10-12 (×2): qty 2

## 2016-10-12 MED ORDER — GI COCKTAIL ~~LOC~~
30.0000 mL | Freq: Three times a day (TID) | ORAL | Status: DC | PRN
  Administered 2016-10-12: 30 mL via ORAL
  Filled 2016-10-12: qty 30

## 2016-10-12 NOTE — ED Notes (Signed)
Report given to [place] at [place]

## 2016-10-12 NOTE — ED Triage Notes (Signed)
Pt c/o back pain radiating to bialteral arms, describes pain as a "bubble that moves from between my shoulder blades down to my lower back, back up, and down my arms on both sides." Also reports burning to epigastric abdomen and central chest, feeling like "an elephant was stepping on my chest," and pitting edema in his legs. No new SOB. Wears 2 L/min O2 at home.

## 2016-10-12 NOTE — ED Notes (Signed)
[facility] has been notified for need of transport
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
## 2016-10-12 NOTE — ED Notes (Signed)
Report given to [facility] on 4W
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
## 2016-10-12 NOTE — ED Notes (Signed)
Attempt to call report to 3W at [facility]. RN will call back when she is available

## 2016-10-13 ENCOUNTER — Inpatient Hospital Stay (HOSPITAL_COMMUNITY): Payer: Medicare Other

## 2016-10-13 DIAGNOSIS — I340 Nonrheumatic mitral (valve) insufficiency: Secondary | ICD-10-CM

## 2016-10-13 LAB — ECHOCARDIOGRAM COMPLETE
Height: 58 in
WEIGHTICAEL: 3601.43 [oz_av]

## 2016-10-13 LAB — TROPONIN I
TROPONIN I: 0.13 ng/mL — AB (ref <0.03)
TROPONIN I: 0.12 ng/mL — AB (ref <0.03)
TROPONIN I: 0.13 ng/mL — AB (ref <0.03)
Troponin I: 0.14 ng/mL (ref <0.03)

## 2016-10-13 LAB — CBC
HCT: 46.3 % (ref 39.0–52.0)
HEMOGLOBIN: 14.1 g/dL (ref 13.0–17.0)
MCH: 29.1 pg (ref 26.0–34.0)
MCHC: 30.5 g/dL (ref 30.0–36.0)
MCV: 95.7 fL (ref 78.0–100.0)
Platelets: 210 10*3/uL (ref 150–400)
RBC: 4.84 MIL/uL (ref 4.22–5.81)
RDW: 11.9 % (ref 11.5–15.5)
WBC: 9.5 10*3/uL (ref 4.0–10.5)

## 2016-10-13 LAB — BASIC METABOLIC PANEL
ANION GAP: 9 (ref 5–15)
BUN: 18 mg/dL (ref 6–20)
CALCIUM: 9.1 mg/dL (ref 8.9–10.3)
CO2: 36 mmol/L — ABNORMAL HIGH (ref 22–32)
Chloride: 98 mmol/L — ABNORMAL LOW (ref 101–111)
Creatinine, Ser: 1.13 mg/dL (ref 0.61–1.24)
GLUCOSE: 86 mg/dL (ref 65–99)
Potassium: 3.6 mmol/L (ref 3.5–5.1)
SODIUM: 143 mmol/L (ref 135–145)

## 2016-10-13 MED ORDER — FUROSEMIDE 10 MG/ML IJ SOLN
60.0000 mg | Freq: Two times a day (BID) | INTRAMUSCULAR | Status: AC
  Administered 2016-10-13 – 2016-10-14 (×3): 60 mg via INTRAVENOUS
  Filled 2016-10-13 (×3): qty 6

## 2016-10-13 MED ORDER — PERFLUTREN LIPID MICROSPHERE
1.0000 mL | INTRAVENOUS | Status: AC | PRN
  Administered 2016-10-13: 2 mL via INTRAVENOUS
  Filled 2016-10-13: qty 10

## 2016-10-13 NOTE — Progress Notes (Addendum)
  Echocardiogram 2D Echocardiogram has been performed. Technically difficult study due to patient body habitus. Definity was administered in order to better assess LV function.   Jerry Edwards 10/13/2016, 9:43 AM

## 2016-10-13 NOTE — Progress Notes (Addendum)
Patient ID: Jerry Edwards, male   DOB: 01-Oct-1970, 46 y.o.   MRN: [id number]    PROGRESS NOTE   Jerry Edwards  LYY:[id number] DOB: [birth year] DOA: 10/12/2016  PCP: Patient, No Pcp Per   Brief Narrative:  46 y.o. male with known NICM, chronic sCHF (has had EF as low as 25% back in 2015), HTN, OSA, previous hospitalization for recurrent HF and severe HTN, presented today with main concern of several days duration of intermittent episodes of epigastric and mid chest area discomfort that pt described as pressure like "elephant sitting on the chest".   Assessment & Plan:   Assessment/Plan Active Problems:   Chest pain - in pt with significant cardiac history - troponins elevated but in flat trend, not consistent with ACS - pt still with intermittent episodes of chest pain  - cardiology has been consulted and will see first thing in AM - keep on tele, continue to cycle CE's - ECHO on this admission with EF 35-40%, was ~ 40% on ECHO in 2017    Acute on chronic systolic and diastolic CHF - will change lasix to IV, 60 mg BID (pt is on Lasix 80 mg PO BID at home) - monitor daily weight - strict I/O - weight trend since admission:  Filed Weights   10/12/16 1322 10/12/16 2057  Weight: 102.1 kg (225 lb) 102.1 kg (225 lb 1.4 oz)     Morbid obesity - Body mass index is 47.04 kg/m.    HTN, essential  - continue Coreg, hydralazine, Imdur, lasix, Entresto  DVT prophylaxis: Lovenox SQ Code Status: Full  Family Communication: Patient at bedside  Disposition Plan: home when cardiology team clears   Consultants:   Cardiology   Procedures:   None  Antimicrobials:   None  Subjective: Still with intermittent episodes of chest pain.   Objective: Vitals:   10/13/16 0445 10/13/16 1053 10/13/16 1300 10/13/16 1311  BP: (!) 144/89 (!) 183/100 (!) 168/94 132/75  Pulse: 70   86  Resp: 20   18  Temp: 98.6 F (37 C)   99.4 F (37.4 C)  TempSrc: Oral   Oral  SpO2: 97%   97%  Weight:       Height:        Intake/Output Summary (Last 24 hours) at 10/13/16 1443 Last data filed at 10/13/16 1303  Gross per 24 hour  Intake              240 ml  Output             1400 ml  Net            -1160 ml   Filed Weights   10/12/16 1322 10/12/16 2057  Weight: 102.1 kg (225 lb) 102.1 kg (225 lb 1.4 oz)    Examination:  General exam: Appears calm and comfortable  Respiratory system: Respiratory effort normal. crackles at bases  Cardiovascular system: RRR. No rubs, gallops or clicks. +2 bilateral LE edema  Gastrointestinal system: Abdomen is nondistended, soft and nontender. No organomegaly or masses felt. Normal bowel sounds heard. Central nervous system: Alert and oriented. No focal neurological deficits.  Data Reviewed: I have personally reviewed following labs and imaging studies  CBC:  Recent Labs Lab 10/12/16 1437 10/12/16 2146 10/13/16 0254  WBC 7.8 7.8 9.5  HGB 14.4 14.1 14.1  HCT 47.4 46.3 46.3  MCV 96.0 95.9 95.7  PLT 242 243 127   Basic Metabolic Panel:  Recent Labs Lab 10/12/16 1437 10/12/16 2146 10/13/16  0254  NA 142  --  143  K 3.5  --  3.6  CL 102  --  98*  CO2 33*  --  36*  GLUCOSE 143*  --  86  BUN 15  --  18  CREATININE 0.99 1.00 1.13  CALCIUM 9.0  --  9.1   Cardiac Enzymes:  Recent Labs Lab 10/12/16 2146 10/13/16 0254 10/13/16 0904  TROPONINI 0.12* 0.13* 0.12*   Thyroid Function Tests:  Recent Labs  10/12/16 2146  TSH 0.824   Urine analysis:    Component Value Date/Time   COLORURINE YELLOW 05/26/2011 2259   APPEARANCEUR CLEAR 05/26/2011 2259   LABSPEC 1.028 05/26/2011 2259   PHURINE 6.5 05/26/2011 2259   GLUCOSEU NEGATIVE 05/26/2011 2259   HGBUR NEGATIVE 05/26/2011 2259   BILIRUBINUR NEGATIVE 05/26/2011 2259   KETONESUR NEGATIVE 05/26/2011 2259   PROTEINUR 30 (A) 05/26/2011 2259   UROBILINOGEN 1.0 05/26/2011 2259   NITRITE NEGATIVE 05/26/2011 2259   LEUKOCYTESUR NEGATIVE 05/26/2011 2259   Radiology Studies: Dg  Chest 2 View  Result Date: 10/12/2016 CLINICAL DATA:  Back pain EXAM: CHEST  2 VIEW COMPARISON:  02/21/2016 FINDINGS: Cardiac shadow is again enlarged but stable. The overall inspiratory effort is poor but similar to that noted on the prior exam. No focal infiltrate or sizable effusion is seen. Mild chronic interstitial changes are seen. No acute bony abnormality is noted. IMPRESSION: No acute abnormality seen. Electronically Signed   By: Inez Catalina M.D.   On: 10/12/2016 14:27    Scheduled Meds: . aspirin EC  81 mg Oral Daily  . carvedilol  3.125 mg Oral BID WC  . enoxaparin (LOVENOX) injection  0.5 mg/kg Subcutaneous Q24H  . furosemide  80 mg Oral BID  . hydrALAZINE  50 mg Oral TID  . isosorbide mononitrate  30 mg Oral Daily  . potassium chloride SA  40 mEq Oral Daily  . sacubitril-valsartan  1 tablet Oral BID   Continuous Infusions:   LOS: 1 day   Time spent: 25 minutes   Faye Ramsay, MD [facility] Pager [phone]  If 7PM-7AM, please contact night-coverage [email or web address] Password [facility] 10/13/2016, 2:43 PM

## 2016-10-14 DIAGNOSIS — R9431 Abnormal electrocardiogram [ECG] [EKG]: Secondary | ICD-10-CM

## 2016-10-14 DIAGNOSIS — R748 Abnormal levels of other serum enzymes: Secondary | ICD-10-CM

## 2016-10-14 DIAGNOSIS — I5043 Acute on chronic combined systolic (congestive) and diastolic (congestive) heart failure: Secondary | ICD-10-CM

## 2016-10-14 DIAGNOSIS — R0789 Other chest pain: Secondary | ICD-10-CM

## 2016-10-14 LAB — TROPONIN I: Troponin I: 0.14 ng/mL (ref <0.03)

## 2016-10-14 LAB — CBC
HCT: 44.1 % (ref 39.0–52.0)
HEMOGLOBIN: 13.6 g/dL (ref 13.0–17.0)
MCH: 29.3 pg (ref 26.0–34.0)
MCHC: 30.8 g/dL (ref 30.0–36.0)
MCV: 95 fL (ref 78.0–100.0)
PLATELETS: 204 10*3/uL (ref 150–400)
RBC: 4.64 MIL/uL (ref 4.22–5.81)
RDW: 11.9 % (ref 11.5–15.5)
WBC: 7.8 10*3/uL (ref 4.0–10.5)

## 2016-10-14 LAB — BASIC METABOLIC PANEL
ANION GAP: 7 (ref 5–15)
BUN: 19 mg/dL (ref 6–20)
CHLORIDE: 98 mmol/L — AB (ref 101–111)
CO2: 34 mmol/L — AB (ref 22–32)
Calcium: 9.2 mg/dL (ref 8.9–10.3)
Creatinine, Ser: 1.12 mg/dL (ref 0.61–1.24)
GFR calc Af Amer: >60 mL/min (ref >60)
GFR calc non Af Amer: >60 mL/min (ref >60)
GLUCOSE: 142 mg/dL — AB (ref 65–99)
Potassium: 3.5 mmol/L (ref 3.5–5.1)
Sodium: 139 mmol/L (ref 135–145)

## 2016-10-14 LAB — HIV ANTIBODY (ROUTINE TESTING W REFLEX): HIV SCREEN 4TH GENERATION: NONREACTIVE

## 2016-10-14 MED ORDER — FUROSEMIDE 20 MG PO TABS
60.0000 mg | ORAL_TABLET | Freq: Two times a day (BID) | ORAL | Status: DC
  Administered 2016-10-15: 60 mg via ORAL
  Filled 2016-10-14: qty 1

## 2016-10-14 MED ORDER — PANTOPRAZOLE SODIUM 40 MG PO TBEC
40.0000 mg | DELAYED_RELEASE_TABLET | Freq: Every day | ORAL | Status: DC
Start: 2016-10-14 — End: 2016-10-15
  Administered 2016-10-14 – 2016-10-15 (×2): 40 mg via ORAL
  Filled 2016-10-14 (×2): qty 1

## 2016-10-14 MED ORDER — ENALAPRILAT 1.25 MG/ML IV SOLN: 0.6250 mg | Freq: Once | INTRAVENOUS | Status: DC

## 2016-10-14 MED ORDER — HYDRALAZINE HCL 20 MG/ML IJ SOLN
5.0000 mg | Freq: Once | INTRAMUSCULAR | Status: DC
  Filled 2016-10-14: qty 0.25
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
## 2016-10-14 NOTE — Progress Notes (Signed)
Notified [facility] for transportation for procedure at 0900 at [facility]

## 2016-10-14 NOTE — Evaluation (Signed)
Physical Therapy One Time Evaluation Patient Details Name: Jerry Edwards MRN: [id number] DOB: 1970/06/15 Today's Date: 10/14/2016   History of Present Illness  46 year old male with PMH of systolic CHF, NICM, osteogenesis imperfecta leaving him wheel chair bound and admitted for chest pain complicated by systolic CHF.  Per cardiology notes, plan for [facility] 7/26 and pt is in the process of having a repeat cardiac MRI to assess for cardiac sarcoidosis.  Clinical Impression  Patient evaluated by Physical Therapy with no further acute PT needs identified. All education has been completed and the patient has no further questions. Pt appears at baseline with transfers and w/c mobility.  Pt denies any symptoms including chest pain while mobilizing. No further follow-up Physical Therapy or equipment needs. PT is signing off. Thank you for this referral.     Follow Up Recommendations No PT follow up    Equipment Recommendations  None recommended by PT    Recommendations for Other Services       Precautions / Restrictions Precautions Precautions: Fall Precaution Comments: chronic 2L O2 [place]      Mobility  Bed Mobility Overal bed mobility: Modified Independent                Transfers     Transfers: Stand Pivot Transfers   Stand pivot transfers: Modified independent (Device/Increase time)       General transfer comment: transfers easily bed <-> wheelchair  Ambulation/Gait                Hotel manager mobility: Yes Wheelchair propulsion: Both upper extremities Wheelchair parts: Independent Distance: 180 Wheelchair Assistance Details (indicate cue type and reason): pt propels w/c well, HR in 90s during mobility, remained on 3L O2 [place] (was on 3L at rest in room), denies SOB and denies chest pain  Modified Rankin (Stroke Patients Only)       Balance                                             Pertinent Vitals/Pain Pain Assessment: No/denies pain    Home Living Family/patient expects to be discharged to:: Private residence             Home Layout: One [place]: Wheelchair - manual      Prior Function Level of Independence: Independent with assistive device(s)         Comments: uses manual w/c for mobility     Hand Dominance        Extremity/Trunk Assessment   Upper Extremity Assessment Upper Extremity Assessment: Overall WFL for tasks assessed    Lower Extremity Assessment Lower Extremity Assessment: Overall WFL for tasks assessed (strength WFLs, extremities shorter likely due to osteogenesis imperfecta)       Communication   Communication: No difficulties  Cognition Arousal/Alertness: Awake/alert Behavior During Therapy: WFL for tasks assessed/performed Overall Cognitive Status: Within Functional Limits for tasks assessed                                        General Comments      Exercises     Assessment/Plan    PT Assessment Patent does not need any further PT  services  PT Problem List         PT Treatment Interventions      PT Goals (Current goals can be found in the Care Plan section)  Acute Rehab PT Goals PT Goal Formulation: All assessment and education complete, DC therapy    Frequency     Barriers to discharge        Co-evaluation               AM-PAC PT "6 Clicks" Daily Activity  Outcome Measure Difficulty turning over in bed (including adjusting bedclothes, sheets and blankets)?: None Difficulty moving from lying on back to sitting on the side of the bed? : None Difficulty sitting down on and standing up from a chair with arms (e.g., wheelchair, bedside commode, etc,.)?: None Help needed moving to and from a bed to chair (including a wheelchair)?: None Help needed walking in hospital room?: Total (w/c mobility) Help needed climbing 3-5 steps with a railing? :  Total 6 Click Score: 18    End of Session   Activity Tolerance: Patient tolerated treatment well Patient left: in bed;with call bell/phone within reach Nurse Communication: Mobility status PT Visit Diagnosis: Other abnormalities of gait and mobility (R26.89)    Time: 6712-4580 PT Time Calculation (min) (ACUTE ONLY): 9 min   Charges:   PT Evaluation $PT Eval Low Complexity: 1 Procedure     PT G CodesCarmelia Bake, PT, DPT 10/14/2016 Pager: [phone]   York Ram E 10/14/2016, 2:33 PM

## 2016-10-14 NOTE — Progress Notes (Signed)
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 He describes the pain as traveling up right arm to chest and down the left arm and a pressure in his back as if some one was driving their knee in to his back. The pain comes and goes lasting between 3 and 30 minutes. Burping relieved pain but continues to reoccure. Chest pain initially occured after meals and then with rest. Made better by sitting up. He reports that GI cocktail in ED subsided symptoms. Patient denies history of GERD, and claims to be taking all of his prescribed medications. Admits increased edema of upper and lower extremities in the ED. Says he checks weight 3x per week with little fluctuation. He uses O2 at home at 2 L [place].    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   - MRI - was not able to complete last scan due to body size, initial scan from jan 2015 showed concern for sarcoid  Acute on chronic systolic CHF  - continue coreg  - continue lasix   - continue hydralazine   - continue Imdur  - continue entresto     DVT prophylaxis: lovenox  Code Status: full Family Communication: none Disposition Plan: 7/26 pending cardiology consult  Consultants:   Cardiology   Procedures:   2D echo: 35 to 40% ef with hypokinesis, mild regurgitation, and mild LT atrial dilation  Foley: none  BiPAP: none  HD:  none    Antimicrobials:  none  Subjective: Patient is laying in bed comfortably with  set to 2L and states that he feels better and has been without chest pain since arrival to the ED. Awaiting cardiology consul. Denies chest pain, shortness of breath, swelling, and extremity pain.   Objective: Vitals:   10/13/16 2140 10/14/16 0442 10/14/16 0516 10/14/16 0800  BP: 140/89 (!) 163/100 (!) 160/110 (!) 160/84  Pulse: 74 76    Resp: 18 18    Temp: 97.8 F (36.6 C) 97.6 F (36.4 C)    TempSrc: Axillary Oral    SpO2: 97% 98%    Weight:  102 kg (224 lb 13.9 oz)    Height:        Intake/Output Summary (Last 24 hours) at 10/14/16 1206 Last data filed at 10/14/16 1000  Gross per 24 hour  Intake              480 ml  Output             2525 ml  Net            -2045 ml   Filed Weights   10/12/16 1322 10/12/16 2057 10/14/16 0442  Weight: 102.1 kg (225 lb) 102.1 kg (225 lb 1.4 oz) 102 kg (224 lb 13.9 oz)    Examination:  Vitals:   10/13/16 2140  10/14/16 0442 10/14/16 0516 10/14/16 0800  BP: 140/89 (!) 163/100 (!) 160/110 (!) 160/84  Pulse: 74 76    Resp: 18 18    Temp: 97.8 F (36.6 C) 97.6 F (36.4 C)    TempSrc: Axillary Oral    SpO2: 97% 98%    Weight:  102 kg (224 lb 13.9 oz)    Height:        Constitutional: NAD Eyes: chronic right eye lid twitch/tick,  lids and conjunctivae normal ENMT: Mucous membranes are moist. Neck: normal, supple,  Respiratory: clear to auscultation bilaterally, no wheezing, no crackles. Normal respiratory effort. No accessory muscle use.  Cardiovascular: Regular rate and rhythm, no murmurs / rubs / gallops. No LE edema. 2+ pedal pulses.  Abdomen: no tenderness. Bowel sounds positive.  Musculoskeletal: no clubbing / cyanosis. Short and deformed lower extremities. Patient does not ambulate and requires a wheel chair, no extremity edema Skin: no rashes, lesions, ulcers. No induration Neurologic: CN 2-12 grossly intact. Psychiatric: Normal  judgment and insight. Alert and oriented x 3. Normal mood.    Data Reviewed: I have independently reviewed following labs and imaging studies   CBC:  Recent Labs Lab 10/12/16 1437 10/12/16 2146 10/13/16 0254 10/14/16 0241  WBC 7.8 7.8 9.5 7.8  HGB 14.4 14.1 14.1 13.6  HCT 47.4 46.3 46.3 44.1  MCV 96.0 95.9 95.7 95.0  PLT 242 243 210 106   Basic Metabolic Panel:  Recent Labs Lab 10/12/16 1437 10/12/16 2146 10/13/16 0254 10/14/16 0241  NA 142  --  143 139  K 3.5  --  3.6 3.5  CL 102  --  98* 98*  CO2 33*  --  36* 34*  GLUCOSE 143*  --  86 142*  BUN 15  --  18 19  CREATININE 0.99 1.00 1.13 1.12  CALCIUM 9.0  --  9.1 9.2   GFR: Estimated Creatinine Clearance: 79.3 mL/min (by C-G formula based on SCr of 1.12 mg/dL). Liver Function Tests: No results for input(s): AST, ALT, ALKPHOS, BILITOT, PROT, ALBUMIN in the last 168 hours. No results for input(s): LIPASE, AMYLASE in the last 168 hours. No results for input(s): AMMONIA in the last 168 hours. Coagulation Profile: No results for input(s): INR, PROTIME in the last 168 hours. Cardiac Enzymes:  Recent Labs Lab 10/13/16 0254 10/13/16 0904 10/13/16 1504 10/13/16 2034 10/14/16 0241  TROPONINI 0.13* 0.12* 0.13* 0.14* 0.14*   BNP (last 3 results) No results for input(s): PROBNP in the last 8760 hours. HbA1C: No results for input(s): HGBA1C in the last 72 hours. CBG: No results for input(s): GLUCAP in the last 168 hours. Lipid Profile: No results for input(s): CHOL, HDL, LDLCALC, TRIG, CHOLHDL, LDLDIRECT in the last 72 hours. Thyroid Function Tests:  Recent Labs  10/12/16 2146  TSH 0.824   Anemia Panel: No results for input(s): VITAMINB12, FOLATE, FERRITIN, TIBC, IRON, RETICCTPCT in the last 72 hours. Urine analysis:    Component Value Date/Time   COLORURINE YELLOW 05/26/2011 [place] 05/26/2011 2259   LABSPEC 1.028 05/26/2011 2259   PHURINE 6.5 05/26/2011 2259   GLUCOSEU NEGATIVE  05/26/2011 2259   HGBUR NEGATIVE 05/26/2011 2259   BILIRUBINUR NEGATIVE 05/26/2011 2259   KETONESUR NEGATIVE 05/26/2011 2259   PROTEINUR 30 (A) 05/26/2011 2259   UROBILINOGEN 1.0 05/26/2011 2259   NITRITE NEGATIVE 05/26/2011 2259   LEUKOCYTESUR NEGATIVE 05/26/2011 2259   Sepsis Labs: Invalid input(s): PROCALCITONIN, LACTICIDVEN  No results found for this or any previous visit (from the  past 240 hour(s)).    Radiology Studies: Dg Chest 2 View  Result Date: 10/12/2016 CLINICAL DATA:  Back pain EXAM: CHEST  2 VIEW COMPARISON:  02/21/2016 FINDINGS: Cardiac shadow is again enlarged but stable. The overall inspiratory effort is poor but similar to that noted on the prior exam. No focal infiltrate or sizable effusion is seen. Mild chronic interstitial changes are seen. No acute bony abnormality is noted. IMPRESSION: No acute abnormality seen. Electronically Signed   By: Inez Catalina M.D.   On: 10/12/2016 14:27     Scheduled Meds: . aspirin EC  81 mg Oral Daily  . carvedilol  3.125 mg Oral BID WC  . enoxaparin (LOVENOX) injection  0.5 mg/kg Subcutaneous Q24H  . furosemide  60 mg Intravenous BID  . hydrALAZINE  5 mg Intravenous Once  . hydrALAZINE  50 mg Oral TID  . isosorbide mononitrate  30 mg Oral Daily  . pantoprazole  40 mg Oral Daily  . potassium chloride SA  40 mEq Oral Daily  . sacubitril-valsartan  1 tablet Oral BID   Continuous Infusions:     Attending MD note  Patient was seen, examined,treatment plan was discussed with the PA-S.  I have personally reviewed the clinical findings, lab, imaging studies and management of this patient in detail. I agree with the documentation, as recorded by the PA-S  Patient is a pleasant 46 year old male with history of nonischemic cardiomyopathy, chronic combined CHF, who was admitted to the hospital on 7/23 chest pain.  Cardiology consulted.  On Exam: Gen. exam: Awake, alert, not in any distress Chest: Good air entry bilaterally, no  rhonchi or rales CVS: S1-S2 regular, no murmurs Abdomen: Soft, nontender and nondistended Neurology: Non-focal Skin: No rash or lesions  Plan  Chest pain -Cardiology consult appreciated, patient will undergo stress testing tomorrow, possible that his chest pain is in the setting of fluid overload, received IV Lasix, transition to p.o. tomorrow  Acute on chronic combined CHF -2D echo relatively unchanged, he is followed by heart failure team as an outpatient, plan to get a repeat cardiac MRI as an outpatient to evaluate for sarcoidosis. 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 Cruzita Lederer, MD [facility] [phone]  If 7PM-7AM, please contact night-coverage [email or web address] Password [place] PA-S [facility] Pager [phone]  If 7PM-7AM, please contact night-coverage [email or web address] Password [email or web address] 10/14/2016, 12:06 PM   [email or web address]

## 2016-10-15 ENCOUNTER — Ambulatory Visit (HOSPITAL_COMMUNITY)
Admission: RE | Admit: 2016-10-15 | Discharge: 2016-10-15 | Disposition: A | Discharge status: Home / Self Care | Payer: Medicare Other | Source: Ambulatory Visit | Attending: Cardiology | Admitting: Cardiology

## 2016-10-15 DIAGNOSIS — R079 Chest pain, unspecified: Secondary | ICD-10-CM | POA: Insufficient documentation

## 2016-10-15 LAB — BASIC METABOLIC PANEL
ANION GAP: 6 (ref 5–15)
BUN: 14 mg/dL (ref 6–20)
CHLORIDE: 100 mmol/L — AB (ref 101–111)
CO2: 35 mmol/L — ABNORMAL HIGH (ref 22–32)
Calcium: 9 mg/dL (ref 8.9–10.3)
Creatinine, Ser: 1.1 mg/dL (ref 0.61–1.24)
GFR calc Af Amer: >60 mL/min (ref >60)
GLUCOSE: 115 mg/dL — AB (ref 65–99)
POTASSIUM: 3.7 mmol/L (ref 3.5–5.1)
Sodium: 141 mmol/L (ref 135–145)

## 2016-10-15 LAB — NM MYOCAR MULTI W/SPECT W/WALL MOTION / EF
CHL CUP RESTING HR STRESS: 86 {beats}/min
CSEPED: 5 min
CSEPEDS: 15 s
CSEPEW: 1 METS
CSEPHR: 60 %
MPHR: 174 {beats}/min
Peak HR: 106 {beats}/min

## 2016-10-15 MED ORDER — REGADENOSON 0.4 MG/5ML IV SOLN
INTRAVENOUS | Status: AC
  Filled 2016-10-15: qty 5

## 2016-10-15 MED ORDER — TECHNETIUM TC 99M TETROFOSMIN IV KIT
30.0000 | PACK | Freq: Once | INTRAVENOUS | Status: AC | PRN
  Administered 2016-10-15: 30 via INTRAVENOUS

## 2016-10-15 MED ORDER — REGADENOSON 0.4 MG/5ML IV SOLN
0.4000 mg | Freq: Once | INTRAVENOUS | Status: AC
  Administered 2016-10-15: 0.4 mg via INTRAVENOUS

## 2016-10-15 MED ORDER — TECHNETIUM TC 99M TETROFOSMIN IV KIT
10.0000 | PACK | Freq: Once | INTRAVENOUS | Status: AC | PRN
  Administered 2016-10-15: 10 via INTRAVENOUS

## 2016-10-15 MED ORDER — FUROSEMIDE 20 MG PO TABS: 60.0000 mg | ORAL_TABLET | Freq: Two times a day (BID) | ORAL | 1 refills | Status: DC

## 2016-10-15 NOTE — Progress Notes (Signed)
[name] stress test was reviewed.  It is high risk, mostly due to his reduced systolic function.  The inferior wall abnormalities are consistent with diaphragm attenuation artifact and his apical defect is better at stress than rest, making it very unlikely to be ischemia.  This is especially true in light of his normal LHC in 2015. He is now symptom free.  I suspect his chest tightness was 2/2 volume overload.  We will arrange follow up with [name].  If he has persistent symptom, consider LHC.    Danya Spearman C. Oval Linsey, MD, [facility]  10/15/2016 4:49 PM

## 2016-10-15 NOTE — Progress Notes (Signed)
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 He describes the pain as traveling up right arm to chest and down the left arm and a pressure in his back as if some one was driving their knee in to his back. The pain comes and goes lasting between 3 and 30 minutes. Burping relieved pain but continues to reoccure. Chest pain initially occured after meals and then with rest. Made better by sitting up. He reports that GI cocktail in ED subsided symptoms. Patient denies history of GERD, and claims to be taking all of his prescribed medications. Admits increased edema of upper and lower extremities in the ED. Says he checks weight 3x per week with little fluctuation. He uses O2 at home at 2 L [place]. preliminary reports indicate a negative stress test.   Assessment & Plan: Active Problems:   Atypical chest pain   EKG abnormalities   Chest pain complicated by systolic CHF- cardiac etiology vs GI etiology   - cardiology consult             - continue PPI             - Lovenox             - aspirin              - troponin - running flat with slow incline of .12 to .14              - EKG showed precordial T wave inversion at admission             - Nuclear stress test - no ST segment deviation noted during stress, no arrhythmias during stress, no arrhythmias during recovery, no significant arrhythmias noted during the test.   Non-ischemic cardiomyopathy             - follow up outpatient MRI for possible sarcoid  Acute on chronic systolic CHF             - continue coreg             - continue lasix              - continue hydralazine              - continue Imdur             - continue entresto  DVT prophylaxis: lovenox Code Status: full Family  Communication: spoke with mother about plan for today Disposition Plan: DC hopefully after stress test   Consultants:   cardiology  Procedures:   2D echo: 35 to 40% ef with hypokinesis, mild regurgitation, and mild LT atrial dilation  Foley:   BiPAP:   HD:     Antimicrobials:  none  Subjective: 7-25 Patient is laying in bed comfortably with [place] set to 2L and states that he feels better and has been without chest pain since arrival to the ED. Awaiting cardiology consul. Denies chest pain, shortness of breath, swelling, and extremity pain.   7-26 He is sitting up in bed talking with his mother with out oxygen on. He states that he feels great! Denies chest pain, sob, swelling, and extremity pain. He is waiting to be transported for stress test. Objective:  Vitals:   10/14/16 0800 10/14/16 1450 10/14/16 2143 10/15/16 0517  BP: (!) 160/84 (!) 155/80 (!) 149/96 131/76  Pulse:   87 86  Resp:  18 18 20   Temp:  98.7 F (37.1 C) 98.2 F (36.8 C) 98.3 F (36.8 C)  TempSrc:  Oral Oral Oral  SpO2:  97% 98% 95%  Weight:    102.2 kg (225 lb 5 oz)  Height:        Intake/Output Summary (Last 24 hours) at 10/15/16 1131 Last data filed at 10/15/16 0858  Gross per 24 hour  Intake                0 ml  Output              575 ml  Net             -575 ml   Filed Weights   10/12/16 2057 10/14/16 0442 10/15/16 0517  Weight: 102.1 kg (225 lb 1.4 oz) 102 kg (224 lb 13.9 oz) 102.2 kg (225 lb 5 oz)    Examination:  Vitals:   10/14/16 0800 10/14/16 1450 10/14/16 2143 10/15/16 0517  BP: (!) 160/84 (!) 155/80 (!) 149/96 131/76  Pulse:   87 86  Resp:  18 18 20   Temp:  98.7 F (37.1 C) 98.2 F (36.8 C) 98.3 F (36.8 C)  TempSrc:  Oral Oral Oral  SpO2:  97% 98% 95%  Weight:    102.2 kg (225 lb 5 oz)  Height:        Constitutional: NAD Eyes: chronic right eye lid twitch/tick,  lids and conjunctivae normal ENMT: Mucous membranes are moist. Neck: normal, supple,  Respiratory:  clear to auscultation bilaterally, no wheezing, no crackles. Normal respiratory effort. No accessory muscle use.  Cardiovascular: Regular rate and rhythm, no murmurs / rubs / gallops. No LE edema. 2+ pedal pulses.  Abdomen: no tenderness. Bowel sounds positive.  Musculoskeletal: no clubbing / cyanosis. Short and deformed lower extremities. Patient does not ambulate and requires a wheel chair, no extremity edema Skin: no rashes, lesions, ulcers. No induration Neurologic: CN 2-12 grossly intact. Psychiatric: Normal judgment and insight. Alert and oriented x 3. Normal mood.    Data Reviewed: I have independently reviewed following labs and imaging studies   CBC:  Recent Labs Lab 10/12/16 1437 10/12/16 2146 10/13/16 0254 10/14/16 0241  WBC 7.8 7.8 9.5 7.8  HGB 14.4 14.1 14.1 13.6  HCT 47.4 46.3 46.3 44.1  MCV 96.0 95.9 95.7 95.0  PLT 242 243 210 233   Basic Metabolic Panel:  Recent Labs Lab 10/12/16 1437 10/12/16 2146 10/13/16 0254 10/14/16 0241 10/15/16 0431  NA 142  --  143 139 141  K 3.5  --  3.6 3.5 3.7  CL 102  --  98* 98* 100*  CO2 33*  --  36* 34* 35*  GLUCOSE 143*  --  86 142* 115*  BUN 15  --  18 19 14   CREATININE 0.99 1.00 1.13 1.12 1.10  CALCIUM 9.0  --  9.1 9.2 9.0   GFR: Estimated Creatinine Clearance: 80.8 mL/min (by C-G formula based on SCr of 1.1 mg/dL). Liver Function Tests: No results for input(s): AST, ALT, ALKPHOS, BILITOT, PROT, ALBUMIN in the last 168 hours. No results for input(s): LIPASE, AMYLASE in the last 168 hours. No results for input(s): AMMONIA in the last 168 hours. Coagulation Profile: No results for input(s): INR, PROTIME in the last 168 hours. Cardiac Enzymes:  Recent Labs Lab  10/13/16 0254 10/13/16 0904 10/13/16 1504 10/13/16 2034 10/14/16 0241  TROPONINI 0.13* 0.12* 0.13* 0.14* 0.14*   BNP (last 3 results) No results for input(s): PROBNP in the last 8760 hours. HbA1C: No results for input(s): HGBA1C in the last 72  hours. CBG: No results for input(s): GLUCAP in the last 168 hours. Lipid Profile: No results for input(s): CHOL, HDL, LDLCALC, TRIG, CHOLHDL, LDLDIRECT in the last 72 hours. Thyroid Function Tests:  Recent Labs  10/12/16 2146  TSH 0.824   Anemia Panel: No results for input(s): VITAMINB12, FOLATE, FERRITIN, TIBC, IRON, RETICCTPCT in the last 72 hours. Urine analysis:    Component Value Date/Time   COLORURINE YELLOW 05/26/2011 [place] 05/26/2011 2259   LABSPEC 1.028 05/26/2011 2259   PHURINE 6.5 05/26/2011 2259   GLUCOSEU NEGATIVE 05/26/2011 2259   HGBUR NEGATIVE 05/26/2011 2259   BILIRUBINUR NEGATIVE 05/26/2011 2259   KETONESUR NEGATIVE 05/26/2011 2259   PROTEINUR 30 (A) 05/26/2011 2259   UROBILINOGEN 1.0 05/26/2011 2259   NITRITE NEGATIVE 05/26/2011 2259   LEUKOCYTESUR NEGATIVE 05/26/2011 2259   Sepsis Labs: Invalid input(s): PROCALCITONIN, LACTICIDVEN  No results found for this or any previous visit (from the past 240 hour(s)).    Radiology Studies: No results found.   Scheduled Meds: . aspirin EC  81 mg Oral Daily  . carvedilol  3.125 mg Oral BID WC  . enoxaparin (LOVENOX) injection  0.5 mg/kg Subcutaneous Q24H  . furosemide  60 mg Oral BID  . hydrALAZINE  5 mg Intravenous Once  . hydrALAZINE  50 mg Oral TID  . isosorbide mononitrate  30 mg Oral Daily  . pantoprazole  40 mg Oral Daily  . potassium chloride SA  40 mEq Oral Daily  . sacubitril-valsartan  1 tablet Oral BID   Continuous Infusions:    Mitzie Na PA-S [facility] Pager [phone]  If 7PM-7AM, please contact night-coverage [email or web address] Password [email or web address] 10/15/2016, 11:31 AM   [email or web address] @

## 2016-10-15 NOTE — Discharge Summary (Signed)
Physician Discharge Summary  Jerry Edwards PNT:[id number] DOB: October 07, 1970 DOA: 10/12/2016  PCP: Patient, No Pcp Per  Admit date: 10/12/2016 Discharge date: 10/15/2016  Admitted From: home Disposition:  home  Recommendations for Outpatient Follow-up:  1. Follow up with PCP in 1-2 weeks 2. Follow-up with cardiology as scheduled   Home Health: None Equipment/Devices: none  Discharge Condition: Stable CODE STATUS: Full code Diet recommendation: Heart healthy  HPI: Per Dr. Doyle Askew, Jerry Edwards is a 46 y.o. male with known NICM, chronic sCHF (has had EF as low as 25% back in 2015), HTN, OSA, previous hospitalization for recurrent HF and severe HTN, presented today with main concern of several days duration of intermittent episodes of epigastric and mid chest area discomfort that pt described as pressure like "elephant sitting on the chest". Pt reports episodes can last from few minutes up to 30 minutes and typically improve with relaxation and deep breaths. Pt reports pain can be worse after eating but it is not consistent as it can also occur at rest. Patient reports radiating symptoms to bilateral shoulders and back area. Pt reports occasional diaphoresis and dyspnea that occurs with the episodes of chest discomfort and can occur at rest or with exertion. Patient also reports progressively worsening LE edema but he has not gained any weight. Patient denies fevers, chills, no nausea or vomiting. No focal neurological symptoms.   Hospital Course: Discharge Diagnoses:  Principal Problem:   Atypical chest pain Active Problems:   Acute on chronic combined systolic and diastolic heart failure (HCC)   EKG abnormalities   Chest pain -patient was admitted to the hospital for chest pain.  Cardiology consulted and followed patient while hospitalized.  He underwent a stress test on 7/26, and it was read as high risk, however I discussed with cardiology and is likely high risk due to reduced  systolic function rather than ischemia or diaphragmatic attenuation artifact, and patient was discharged home in stable condition to have close outpatient follow-up with Dr. cardiology.  His chest pain has resolved.  Of note, patient had a normal left heart catheterization in 2015. Acute on chronic combined CHF -2D echo relatively unchanged, he is followed by heart failure team as an outpatient, plan to get a repeat cardiac MRI as an outpatient to evaluate for sarcoidosis. 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   carvedilol 6.25 MG tablet Commonly known as:  COREG Take 0.5 tablets (3.125 mg total) by mouth 2 (two) times daily with a meal.   furosemide 20 MG tablet Commonly known as:  LASIX Take 3 tablets (60 mg total) by mouth 2 (two) times daily. What changed:  medication strength  See the new instructions.   hydrALAZINE 25 MG tablet Commonly known as:  APRESOLINE Take 2 tablets (50 mg total) by mouth 3 (three) times daily.   isosorbide mononitrate 30 MG 24 hr tablet Commonly known as:  IMDUR Take 1 tablet (30 mg total) by mouth daily.   KLOR-CON M20 20 MEQ tablet Generic drug:  potassium chloride SA TAKE 2 TABLETS BY MOUTH  ONCE DAILY   sacubitril-valsartan 97-103 MG Commonly known as:  ENTRESTO Take 1 tablet by mouth 2 (two) times daily.      Follow-up Information    Bensimhon, Shaune Pascal, MD. Schedule an appointment as soon as possible for a visit in 4 week(s).   Specialty:  Cardiology Contact information: [place]  [phone]          Allergies  Allergen Reactions  . Fish Allergy Anaphylaxis  . Peanuts [Peanut Oil] Anaphylaxis  . Shellfish Allergy Anaphylaxis  . Latex Itching    Consultations:  Cardiology  Procedures/Studies:  2D echo  Study Conclusions - Left ventricle: Systolic function was moderately reduced. The estimated ejection fraction was in the range of 35% to 40%. Moderate diffuse hypokinesis with no identifiable regional variations. Doppler parameters are consistent with abnormal left ventricular relaxation (grade 1 diastolic dysfunction). - Mitral valve: There was mild regurgitation. - Left atrium: The atrium was mildly dilated. Dg Chest 2 View  Result Date: 10/12/2016 CLINICAL DATA:  Back pain EXAM: CHEST  2 VIEW COMPARISON:  02/21/2016 FINDINGS: Cardiac shadow is again enlarged but stable. The overall inspiratory effort is poor but similar to that noted on the prior exam. No focal infiltrate or sizable effusion is seen. Mild chronic interstitial changes are seen. No acute bony abnormality is noted. IMPRESSION: No acute abnormality seen. Electronically Signed   By: Inez Catalina M.D.   On: 10/12/2016 14:27   Nm Myocar Multi W/spect W/wall Motion / Ef  Result Date: 10/15/2016  No change in EKG from baseline T wave inversions in the inferolateral leads  There is a medium defect of mild severity present in the basal inferior, mid inferior and apical inferior location. The defect is non-reversible and consistent with prior infarct.  There is a small defect of moderate severity present in the apex location. The defect is partially reversible and consistent with infarct with possible small area of peri infarct ischemia.  This is a high risk study.  EF not calculated but visually appears to be moderately to severely reduced.      Subjective: - no chest pain, shortness of breath, no abdominal pain, nausea or vomiting.   Discharge Exam: Vitals:   10/15/16 0517  10/15/16 1327  BP: 131/76 (!) 161/93  Pulse: 86 76  Resp: 20 16  Temp: 98.3 F (36.8 C) 98.5 F (36.9 C)   Vitals:   10/14/16 1450 10/14/16 2143 10/15/16 0517 10/15/16 1327  BP: (!) 155/80 (!) 149/96 131/76 (!) 161/93  Pulse:  87 86 76  Resp: 18 18 20 16   Temp: 98.7 F (37.1 C) 98.2 F (36.8 C) 98.3 F (36.8 C) 98.5 F (36.9 C)  TempSrc: Oral Oral Oral Oral  SpO2: 97% 98% 95% 100%  Weight:   102.2 kg (225 lb 5 oz)   Height:        General: Pt is alert, awake, not in acute distress Cardiovascular: RRR, S1/S2 +, no rubs, no gallops Respiratory: CTA bilaterally, no wheezing, no rhonchi Abdominal: Soft, NT, ND, bowel sounds + Extremities: no edema, no cyanosis    The results of significant diagnostics from this hospitalization (including imaging, microbiology, ancillary and laboratory) are listed below for reference.     Microbiology: No results found for this or any previous visit (from the past 240 hour(s)).   Labs: BNP (last 3 results)  Recent Labs  03/05/16 1133 04/02/16 1230 10/12/16 2146  BNP 184.1* 384.3* 162.8*   Basic  Metabolic Panel:  Recent Labs Lab 10/12/16 1437 10/12/16 2146 10/13/16 0254 10/14/16 0241 10/15/16 0431  NA 142  --  143 139 141  K 3.5  --  3.6 3.5 3.7  CL 102  --  98* 98* 100*  CO2 33*  --  36* 34* 35*  GLUCOSE 143*  --  86 142* 115*  BUN 15  --  18 19 14   CREATININE 0.99 1.00 1.13 1.12 1.10  CALCIUM 9.0  --  9.1 9.2 9.0   Liver Function Tests: No results for input(s): AST, ALT, ALKPHOS, BILITOT, PROT, ALBUMIN in the last 168 hours. No results for input(s): LIPASE, AMYLASE in the last 168 hours. No results for input(s): AMMONIA in the last 168 hours. CBC:  Recent Labs Lab 10/12/16 1437 10/12/16 2146 10/13/16 0254 10/14/16 0241  WBC 7.8 7.8 9.5 7.8  HGB 14.4 14.1 14.1 13.6  HCT 47.4 46.3 46.3 44.1  MCV 96.0 95.9 95.7 95.0  PLT 242 243 210 204   Cardiac Enzymes:  Recent Labs Lab 10/13/16 0254 10/13/16 0904  10/13/16 1504 10/13/16 2034 10/14/16 0241  TROPONINI 0.13* 0.12* 0.13* 0.14* 0.14*   BNP: Invalid input(s): POCBNP CBG: No results for input(s): GLUCAP in the last 168 hours. D-Dimer No results for input(s): DDIMER in the last 72 hours. Hgb A1c No results for input(s): HGBA1C in the last 72 hours. Lipid Profile No results for input(s): CHOL, HDL, LDLCALC, TRIG, CHOLHDL, LDLDIRECT in the last 72 hours. Thyroid function studies  Recent Labs  10/12/16 2146  TSH 0.824   Anemia work up No results for input(s): VITAMINB12, FOLATE, FERRITIN, TIBC, IRON, RETICCTPCT in the last 72 hours. Urinalysis    Component Value Date/Time   COLORURINE YELLOW 05/26/2011 2259   APPEARANCEUR CLEAR 05/26/2011 2259   LABSPEC 1.028 05/26/2011 2259   PHURINE 6.5 05/26/2011 2259   GLUCOSEU NEGATIVE 05/26/2011 2259   HGBUR NEGATIVE 05/26/2011 2259   BILIRUBINUR NEGATIVE 05/26/2011 2259   KETONESUR NEGATIVE 05/26/2011 2259   PROTEINUR 30 (A) 05/26/2011 2259   UROBILINOGEN 1.0 05/26/2011 2259   NITRITE NEGATIVE 05/26/2011 2259   LEUKOCYTESUR NEGATIVE 05/26/2011 2259   Sepsis Labs Invalid input(s): PROCALCITONIN,  WBC,  LACTICIDVEN Microbiology No results found for this or any previous visit (from the past 240 hour(s)).   Time coordinating discharge: 40 minutes  SIGNED:  Marzetta Board, MD  [facility] 10/15/2016, 5:59 PM Pager [phone]  If 7PM-7AM, please contact night-coverage [email or web address] Password [email or web address]

## 2016-10-15 NOTE — Progress Notes (Signed)
Progress Note  Patient Name: Jerry Edwards Date of Encounter: 10/15/2016  Primary Cardiologist: Dr Acie Fredrickson CHF: Dr Haroldine Laws  Patient Profile     46 y.o. male w/ hx  NICM, chronic systolic HF with prior EF of 25% (now 35-40%), normal coronaries by [facility] 03/2013, HTN, OSA and osteogenic imperfecta (wheel chair bound) , who was admitted 07/23 with  complaints of intermittent abdominal and chest pain (epigastric). Cardiac enzymes are abnormal x 3 but with flat trend, 0.13>>0.12>>0.14. 2D echo showed EF of 35-40% with moderate diffuse hypokinesis with no identifiable regional variations. CP was relieved in the ER with a GI cocktail.  Subjective   No more CP, no SOB. Breathing is at baseline.  Inpatient Medications    Scheduled Meds: . aspirin EC  81 mg Oral Daily  . carvedilol  3.125 mg Oral BID WC  . enoxaparin (LOVENOX) injection  0.5 mg/kg Subcutaneous Q24H  . furosemide  60 mg Oral BID  . hydrALAZINE  5 mg Intravenous Once  . hydrALAZINE  50 mg Oral TID  . isosorbide mononitrate  30 mg Oral Daily  . pantoprazole  40 mg Oral Daily  . potassium chloride SA  40 mEq Oral Daily  . sacubitril-valsartan  1 tablet Oral BID   Continuous Infusions:  PRN Meds: gi cocktail, HYDROcodone-acetaminophen, ondansetron **OR** ondansetron (ZOFRAN) IV   Vital Signs    Vitals:   10/14/16 1450 10/14/16 2143 10/15/16 0517 10/15/16 1327  BP: (!) 155/80 (!) 149/96 131/76 (!) 161/93  Pulse:  87 86 76  Resp: 18 18 20 16   Temp: 98.7 F (37.1 C) 98.2 F (36.8 C) 98.3 F (36.8 C) 98.5 F (36.9 C)  TempSrc: Oral Oral Oral Oral  SpO2: 97% 98% 95% 100%  Weight:   225 lb 5 oz (102.2 kg)   Height:        Intake/Output Summary (Last 24 hours) at 10/15/16 1611 Last data filed at 10/15/16 0858  Gross per 24 hour  Intake                0 ml  Output              575 ml  Net             -575 ml   Filed Weights   10/12/16 2057 10/14/16 0442 10/15/16 0517  Weight: 225 lb 1.4 oz (102.1 kg) 224 lb  13.9 oz (102 kg) 225 lb 5 oz (102.2 kg)    Telemetry    SR, pt seen in nuc med - Personally Reviewed  ECG    SR, new T wave inversions multiple leads - Personally Reviewed  Physical Exam   General: Well developed, well nourished, male appearing in no acute distress. Head: Normocephalic, atraumatic.  Neck: Supple without bruits, JVD not elevated but difficult to assess 2nd body habitus. Lungs:  Resp regular and unlabored, few rales bases. Heart: RRR, S1, S2, no S3, S4, 2/6 murmur; no rub. Abdomen: Soft, non-tender, non-distended with normoactive bowel sounds. No hepatomegaly. No rebound/guarding. No obvious abdominal masses. Extremities: No clubbing, cyanosis, no edema. Distal pedal pulses are 2+ bilaterally. Chronic deformities noted Neuro: Alert and oriented X 3.   Psych: Normal affect.  Labs    Hematology  Recent Labs Lab 10/12/16 2146 10/13/16 0254 10/14/16 0241  WBC 7.8 9.5 7.8  RBC 4.83 4.84 4.64  HGB 14.1 14.1 13.6  HCT 46.3 46.3 44.1  MCV 95.9 95.7 95.0  MCH 29.2 29.1 29.3  MCHC 30.5 30.5  30.8  RDW 11.9 11.9 11.9  PLT 243 210 204    Chemistry  Recent Labs Lab 10/13/16 0254 10/14/16 0241 10/15/16 0431  NA 143 139 141  K 3.6 3.5 3.7  CL 98* 98* 100*  CO2 36* 34* 35*  GLUCOSE 86 142* 115*  BUN 18 19 14   CREATININE 1.13 1.12 1.10  CALCIUM 9.1 9.2 9.0  GFRNONAA >60 >60 >60  GFRAA >60 >60 >60  ANIONGAP 9 7 6      Cardiac Enzymes  Recent Labs Lab 10/13/16 0904 10/13/16 1504 10/13/16 2034 10/14/16 0241  TROPONINI 0.12* 0.13* 0.14* 0.14*     Recent Labs Lab 10/12/16 1442 10/12/16 1843  TROPIPOC 0.07 0.00     BNP  Recent Labs Lab 10/12/16 2146  BNP 162.8*     Radiology    Dg Chest 2 View  Result Date: 10/12/2016 CLINICAL DATA:  Back pain EXAM: CHEST  2 VIEW COMPARISON:  02/21/2016 FINDINGS: Cardiac shadow is again enlarged but stable. The overall inspiratory effort is poor but similar to that noted on the prior exam. No focal  infiltrate or sizable effusion is seen. Mild chronic interstitial changes are seen. No acute bony abnormality is noted. IMPRESSION: No acute abnormality seen. Electronically Signed   By: Inez Catalina M.D.   On: 10/12/2016 14:27   Nm Myocar Multi W/spect W/wall Motion / Ef  Result Date: 10/15/2016  No change in EKG from baseline T wave inversions in the inferolateral leads  There is a medium defect of mild severity present in the basal inferior, mid inferior and apical inferior location. The defect is non-reversible and consistent with prior infarct.  There is a small defect of moderate severity present in the apex location. The defect is partially reversible and consistent with infarct with possible small area of peri infarct ischemia.  This is a high risk study.  EF not calculated but visually appears to be moderately to severely reduced.      Cardiac Studies   LEXISCAN MYOVIEW pending  ECHO: 10/13/2016 - Left ventricle: Systolic function was moderately reduced. The   estimated ejection fraction was in the range of 35% to 40%.   Moderate diffuse hypokinesis with no identifiable regional   variations. Doppler parameters are consistent with abnormal left   ventricular relaxation (grade 1 diastolic dysfunction). - Mitral valve: There was mild regurgitation. - Left atrium: The atrium was mildly dilated.   Patient Profile     46 y.o. male w/ hx  NICM, chronic systolic HF with prior EF of 25% (now 35-40%), normal coronaries by [facility] 03/2013, HTN, OSA and osteogenic imperfecta (wheel chair bound) , who was admitted 07/23 with  complaints of intermittent abdominal and chest pain (epigastric). Cardiac enzymes are abnormal x 3 but with flat trend, 0.13>>0.12>>0.14. 2D echo showed EF of 35-40% with moderate diffuse hypokinesis with no identifiable regional variations. CP was relieved in the ER with a GI cocktail.   Assessment & Plan    1. Chest pain - ez mildly elevated with flat trend, more  consistent w/ CHF - Lexiscan MV today to assess for ischemia - GI cocktail relieved sx, consider adding PPI or H2 blocker to home rx  2. Acute on chronic systolic and diastolic CHF - Pt feels much better after diuresis - encouraged diet compliance w/ low Na diet  - continue Entresto, nitrates, BB, hydralazine  Otherwise, per IM Principal Problem:   Atypical chest pain Active Problems:   Acute on chronic combined systolic and diastolic heart  failure [facility])   EKG abnormalities    Signed, Rosaria Ferries , PA-C 4:11 PM 10/15/2016 Pager: [phone]

## 2016-10-30 ENCOUNTER — Ambulatory Visit (HOSPITAL_COMMUNITY)
Admission: RE | Admit: 2016-10-30 | Discharge: 2016-10-30 | Disposition: A | Discharge status: Home / Self Care | Payer: Medicare Other | Source: Ambulatory Visit | Attending: Internal Medicine | Admitting: Internal Medicine

## 2016-10-30 ENCOUNTER — Other Ambulatory Visit (HOSPITAL_COMMUNITY): Payer: Self-pay | Admitting: Pharmacist

## 2016-10-30 VITALS — BP 139/82 | HR 89 | Wt 231.0 lb

## 2016-10-30 DIAGNOSIS — I272 Pulmonary hypertension, unspecified: Secondary | ICD-10-CM | POA: Diagnosis not present

## 2016-10-30 DIAGNOSIS — E859 Amyloidosis, unspecified: Secondary | ICD-10-CM | POA: Diagnosis not present

## 2016-10-30 DIAGNOSIS — I5022 Chronic systolic (congestive) heart failure: Secondary | ICD-10-CM | POA: Diagnosis not present

## 2016-10-30 DIAGNOSIS — Z6841 Body Mass Index (BMI) 40.0 and over, adult: Secondary | ICD-10-CM | POA: Diagnosis not present

## 2016-10-30 DIAGNOSIS — Q780 Osteogenesis imperfecta: Secondary | ICD-10-CM

## 2016-10-30 DIAGNOSIS — N189 Chronic kidney disease, unspecified: Secondary | ICD-10-CM | POA: Diagnosis not present

## 2016-10-30 DIAGNOSIS — I428 Other cardiomyopathies: Secondary | ICD-10-CM | POA: Diagnosis not present

## 2016-10-30 DIAGNOSIS — G4733 Obstructive sleep apnea (adult) (pediatric): Secondary | ICD-10-CM | POA: Insufficient documentation

## 2016-10-30 DIAGNOSIS — I130 Hypertensive heart and chronic kidney disease with heart failure and stage 1 through stage 4 chronic kidney disease, or unspecified chronic kidney disease: Secondary | ICD-10-CM | POA: Insufficient documentation

## 2016-10-30 DIAGNOSIS — Z79899 Other long term (current) drug therapy: Secondary | ICD-10-CM | POA: Insufficient documentation

## 2016-10-30 DIAGNOSIS — I429 Cardiomyopathy, unspecified: Secondary | ICD-10-CM | POA: Diagnosis not present

## 2016-10-30 DIAGNOSIS — Z7982 Long term (current) use of aspirin: Secondary | ICD-10-CM | POA: Insufficient documentation

## 2016-10-30 DIAGNOSIS — R109 Unspecified abdominal pain: Secondary | ICD-10-CM | POA: Diagnosis not present

## 2016-10-30 DIAGNOSIS — Z87891 Personal history of nicotine dependence: Secondary | ICD-10-CM | POA: Insufficient documentation

## 2016-10-30 DIAGNOSIS — J9611 Chronic respiratory failure with hypoxia: Secondary | ICD-10-CM

## 2016-10-30 DIAGNOSIS — J9620 Acute and chronic respiratory failure, unspecified whether with hypoxia or hypercapnia: Secondary | ICD-10-CM | POA: Insufficient documentation

## 2016-10-30 LAB — BASIC METABOLIC PANEL
ANION GAP: 9 (ref 5–15)
BUN: 12 mg/dL (ref 6–20)
CALCIUM: 9.1 mg/dL (ref 8.9–10.3)
CO2: 31 mmol/L (ref 22–32)
Chloride: 102 mmol/L (ref 101–111)
Creatinine, Ser: 0.95 mg/dL (ref 0.61–1.24)
GLUCOSE: 125 mg/dL — AB (ref 65–99)
Potassium: 3.8 mmol/L (ref 3.5–5.1)
Sodium: 142 mmol/L (ref 135–145)

## 2016-10-30 LAB — BRAIN NATRIURETIC PEPTIDE: B NATRIURETIC PEPTIDE 5: 280 pg/mL — AB (ref 0.0–100.0)

## 2016-10-30 MED ORDER — SACUBITRIL-VALSARTAN 97-103 MG PO TABS: 1.0000 | ORAL_TABLET | Freq: Two times a day (BID) | ORAL | 6 refills | Status: DC

## 2016-10-30 MED ORDER — SACUBITRIL-VALSARTAN 97-103 MG PO TABS: 1.0000 | ORAL_TABLET | Freq: Two times a day (BID) | ORAL | 3 refills | Status: DC

## 2016-10-30 NOTE — Patient Instructions (Addendum)
Routine lab work today. Will notify you of abnormal results, otherwise no news is good news!  Follow up in 2 weeks to repeat labs.  __________________________________________________________  __________________________________________________________  Jerry Edwards to 97/103 mg twice daily. Can double up on 49/51 mg tablets you currently have at home. New Rx has been sent for 97/103 mg tablets (Take 1 tab twice daily).  Nuc med study scheduled to rule out cardiac amyloidosis. 8/17 at 12 noon at [facility]  Follow up 6 weeks with [name].  __________________________________________________________  __________________________________________________________  Take all medication as prescribed the day of your appointment. Bring all medications with you to your appointment.  Do the following things EVERYDAY: 1) Weigh yourself in the morning before breakfast. Write it down and keep it in a log. 2) Take your medicines as prescribed 3) Eat low salt foods-Limit salt (sodium) to 2000 mg per day.  4) Stay as active as you can everyday 5) Limit all fluids for the day to less than 2 liters

## 2016-10-30 NOTE — Progress Notes (Signed)
Patient ID: Eivin Mascio, male   DOB: 1970-11-06, 46 y.o.   MRN: [id number]   [facility]     Primary Physician: Needs to establish.  Primary Cardiologist: [name] HF: [name]   HPI: Mr Heeney is a 46 year old with history of NICM, Chronic systolic heart failure, HTN, OSA, and osteogenic imperfecta.   Admitted to [facility] 1/17 through 04/21/13 with increased dyspnea and mild volume overload. Question regarding possible infiltrative disease. ECHO 04/08/2013. EF 25%. cMRI 12/15 EF 38% possible infiltrative disease with discrete areas of non-coronary pattern delayed enhancement. Coronaries were normal on LHC.  SPEP no M spike and UPEP no free light chains. HIV negative and TSH nl.CT of chest no evidence of pulmonary infiltrates. D/C weight 193 pounds.   He was admitted in January 2017 with recurrent HF, severe HTN and hypoxemia. Diuresed and discharged home. Saw Dr. Acie Fredrickson a few weeks and was not taking Bidl because he couldn't afford. So given scripts for hydralazine and Imdur and now can afford. Also started on home O2 for sats in 70s.   Admitted 02/21/16 with acute respiratory failure. Treated with IV lasix but slowed down with creatinine rise. Also had hypertensive emergency on arrival requiring IV NTG. Likely due to non-compliance, as he had stopped several of his medications.    Admitted 7/23 - 10/15/16 with mild chest discomfort lasting up to 30 minutes that improved with rest. It was worse after eating but not consistent, it would also occur at rest. Occasionally associated with diaphoresis and dyspnea. Echo and Stress test as below.    Pt presents today for post hospital follow up.  Feeling better. Still occasionally having upper abdominal/lower chest pain. States it is related to food and feels like a gas bubble. Radiates into back and R shoulder. Relieved with gas and BM, occasionally better with rest, but can also occur at rest. Weight up slightly at home, but has not been watching his  diet as much. Breathing feels fine. Continues to work at [employer].  Denies orthopnea or PND. cMRI not performed, as he was unable to fit in the machine.   Review of systems complete and found to be negative unless listed in HPI.    10/13/16  LVEF 35-40%, Grade 1 DD, Mild MR, Mild LAE  Myoview 10/15/16  No change in EKG from baseline T wave inversions in the inferolateral leads  There is a medium defect of mild severity present in the basal inferior, mid inferior and apical inferior location. The defect is non-reversible and consistent with prior infarct.  There is a small defect of moderate severity present in the apex location. The defect is partially reversible and consistent with infarct with possible small area of peri infarct ischemia.  This is a high risk study.  EF not calculated but visually appears to be moderately to severely reduced.  Echo 1/17 30-35% RV ok  Echo 02/22/16 LVEF 40%, Grade 1 DD.    LHC/RHC 04/08/13  Normal coronaries RA pressure:  39/39 with a mean of 34. Prominent X. and Y. Descends.  RV pressure: 76/26  mean 4 PA pressure: 70/36 with a mean of 51  Pulmonary capillary wedge pressure: 31/30 and mean of 29  LV pressure: 140/26 with a left ventricular end-diastolic pressure of 35  PA sat 66%  AO sat 97%  CO/CI 4.94/2.63  Pulmonary vascular resistance: 4.45 Woods units   Labs (1/15): K 4.6, creatinine 1.25, TSH normal, UPEP negative, SPEP negative, transferrin saturation 39%, HIV negative,  ACE level not elevated.  Labs 1/17: K 4.4 creatinine 0.89  PMH: 1. HTN 2. Osteogenesis imperfecta: wheelchair-bound.  History of bone fractures.  3. Suspect OSA 4. CKD 5. Nonischemic cardiomyopathy: Echo (1/15) with EF 20-25%, severe diffuse hypokinesis, mild MR, mildly enlarged RV with moderately decreased systolic function.  LHC/RHC (1/15) with normal coronaries, mean RA 34, PA 70/36 mean 51, mean PCWP 29, CI 2.63.  Cardiac MRI (1/15) with EF 38%, global  hypokinesis, discrete areas of delayed enhancement in the subepicardial mid inferoseptal RV insertion site, the mid wall of the mid inferolateral wall, and the subendocardial mid anterolateral wall.  This pattern was suggestive of infiltrative disease.  UPEP/SPEP negative, HIV negative.  Chest CT to look for evidence of sarcoidosis did not show any sarcoid-type lesions.    SH:  Single, prior smoking now quit, occasional marijuana, works as Dispensing optician.   FH:  HTN, no significant cardiac disease.   Current Outpatient Prescriptions  Medication Sig Dispense Refill  . aspirin 81 MG tablet Take 1 tablet (81 mg total) by mouth daily.    . carvedilol (COREG) 6.25 MG tablet Take 0.5 tablets (3.125 mg total) by mouth 2 (two) times daily with a meal. 30 tablet 5  . furosemide (LASIX) 20 MG tablet Take 3 tablets (60 mg total) by mouth 2 (two) times daily. 180 tablet 1  . hydrALAZINE (APRESOLINE) 25 MG tablet Take 2 tablets (50 mg total) by mouth 3 (three) times daily. 135 tablet 3  . isosorbide mononitrate (IMDUR) 30 MG 24 hr tablet Take 1 tablet (30 mg total) by mouth daily. 45 tablet 3  . KLOR-CON M20 20 MEQ tablet TAKE 2 TABLETS BY MOUTH ONCE DAILY 60 tablet 3  . sacubitril-valsartan (ENTRESTO) 49-51 MG Take 1 tablet by mouth 2 (two) times daily.    . simethicone (MYLICON) 297 MG chewable tablet Chew 125 mg by mouth every 6 (six) hours as needed for flatulence.     No current facility-administered medications for this encounter.     PHYSICAL EXAM: Vitals:   10/30/16 1158  BP: 139/82  Pulse: 89  SpO2: 95%  Weight: 231 lb (104.8 kg)    Wt Readings from Last 3 Encounters:  10/30/16 231 lb (104.8 kg)  10/15/16 225 lb 5 oz (102.2 kg)  09/08/16 225 lb 8 oz (102.3 kg)    General: NAD. Small stature. In [place].  HEENT: Normal Neck: Supple. JVP difficult, but does not appear elevated. Carotids 2+ bilat; no bruits. No thyromegaly or nodule noted. Cor: PMI nondisplaced. RRR, No M/G/R noted Lungs: CTAB,  normal effort. Abdomen: Soft, non-tender, non-distended, no HSM. No bruits or masses. +BS  Extremities: No cyanosis, clubbing, rash, R and LLE no edema.  Neuro: Alert & orientedx3, cranial nerves grossly intact. moves all 4 extremities w/o difficulty. Affect pleasant   ASSESSMENT & PLAN: 1. Chronic Systolic Heart Failure. NICM EF 25% with no coronary disease on LHC in 1/15. 03/2013 cMRI possible infiltrative disease. SPEP and UPEP ok.  - EF 10/13/2016 35-40%. Myoview high risk due to LV dysfunction with mild inferior scar  - Based on the cMRI pattern 12/15 and concern for cardiac sarcoidosis, a chest CT was done to look for evidence of pulmonary sarcoidosis.  This was not indicative of pulmonary sarcoidosis.  Patient has had no significant arrhythmias detected.  QRS on ECG is not widened.   - Pt unable to fit in cMRI machine due to body stature.  Will order TPY scan to look for amyloid.  -  NYHA class II Symptoms. - Continue lasix 60 mg BID. Can take 80 mg BID as needed.  - Continue coreg 3.25 mg BID for now.    - Increase Entresto to 97/103 mg BID. He never received prescription initially. Confirmed receipt with on-site Pharm-D. He should receive in mail. Will repeat BMET 14 days after he receives and starts (pt to call) - Continue hydralazine 50 mg TID and imdur 30 mg daily.   - Reinforced fluid restriction to < 2 L daily, sodium restriction to less than 2000 mg daily, and the importance of daily weights.   - Remains out of ICD consideration.  2. Osteogenesis imperfecta  - Wheel chair bound; limited mobility. No change from HF perspective.   3. HTN. - Improved control. Meds as above.  4. OSA  - Continue nightly CPAP with 02. No change 5. Morbid Obesity - Body mass index is 48.28 kg/m.  - Needs to watch portions and increase activity as able. No change. 6. Pulmonary hypertension- This appears to be primarily pulmonary venous hypertension on previousRHC.  There may be a component of PAH  related to OSA.   - No change.    7. Chronic respiratory failure - Continue 02. Will attempt to expedite portable 02 for him. 8. Atypical Chest/Abdominal pain - Related to food and relieved with flatus. Sounds GI related but will continue to follow.  - He is s/p cholecystectomy. If worsens, have recommended he see GI. We will make referal if needed.    With previous concerns for cardiac amyloid, and inferior scar on stress test, will schedule a TPY scan to further assess his cardiac tissues. Labs and meds as above.   Shirley Friar, PA-C  10/30/2016   Greater than 50% of the 25 minute visit was spent in counseling/coordination of care regarding disease state education, sliding scale diuretics, salt/fluid restriction, and discussion of plan with MD.

## 2016-10-30 NOTE — Progress Notes (Signed)
Advanced Heart Failure Medication Review by a Pharmacist  Does the patient  feel that his/her medications are working for him/her?  yes  Has the patient been experiencing any side effects to the medications prescribed?  no  Does the patient measure his/her own blood pressure or blood glucose at home?  no   Does the patient have any problems obtaining medications due to transportation or finances?   No - [employer] PAF for [employer] of regimen: good Understanding of indications: good Potential of compliance: good Patient understands to avoid NSAIDs. Patient understands to avoid decongestants.  Issues to address at subsequent visits: None   Pharmacist comments: Jerry Edwards is a pleasant 46 yo M presenting with his mother and without a medication list. He reports good compliance with his regimen but does state that he never received the Entresto 97-103 mg BID because it was not sent to [facility]. He has still been taking Entresto 49-51 mg BID. He did not have any other medication-related questions or concerns for me at this time.   Ruta Hinds. Velva Harman, PharmD, BCPS, CPP Clinical Pharmacist Pager: [phone] Phone: [phone] 10/30/2016 12:06 PM      Time with patient: 10 minutes Preparation and documentation time: 2 minutes Total time: 12 minutes

## 2016-11-03 ENCOUNTER — Encounter (HOSPITAL_COMMUNITY): Payer: Self-pay

## 2016-11-03 NOTE — Progress Notes (Addendum)
Home fit eval for appropriate O2 DME faxed to [facility] as patient is wheelchair bound and unable to carry standard portable tanks with him while out of the home. Jiles Crocker with [facility] made aware order was faxed, original order scanned into patient's electronic medical record.  Renee Pain, RN

## 2016-11-06 ENCOUNTER — Encounter (HOSPITAL_COMMUNITY): Payer: Medicare Other

## 2016-11-06 ENCOUNTER — Encounter (HOSPITAL_COMMUNITY)
Admission: RE | Admit: 2016-11-06 | Discharge: 2016-11-06 | Disposition: A | Discharge status: Home / Self Care | Payer: Medicare Other | Source: Ambulatory Visit | Attending: Student | Admitting: Student

## 2016-11-06 DIAGNOSIS — E859 Amyloidosis, unspecified: Secondary | ICD-10-CM

## 2016-11-06 DIAGNOSIS — I10 Essential (primary) hypertension: Secondary | ICD-10-CM | POA: Diagnosis not present

## 2016-11-13 ENCOUNTER — Encounter (HOSPITAL_COMMUNITY): Payer: Medicare Other | Admitting: Internal Medicine

## 2016-11-13 ENCOUNTER — Ambulatory Visit (HOSPITAL_COMMUNITY)
Admission: RE | Admit: 2016-11-13 | Discharge: 2016-11-13 | Disposition: A | Discharge status: Home / Self Care | Payer: Medicare Other | Source: Ambulatory Visit | Attending: Internal Medicine | Admitting: Internal Medicine

## 2016-11-13 DIAGNOSIS — I5022 Chronic systolic (congestive) heart failure: Secondary | ICD-10-CM

## 2016-11-13 LAB — BASIC METABOLIC PANEL
Anion gap: 7 (ref 5–15)
BUN: 12 mg/dL (ref 6–20)
CHLORIDE: 102 mmol/L (ref 101–111)
CO2: 32 mmol/L (ref 22–32)
Calcium: 8.8 mg/dL — ABNORMAL LOW (ref 8.9–10.3)
Creatinine, Ser: 1.08 mg/dL (ref 0.61–1.24)
GFR calc Af Amer: >60 mL/min (ref >60)
GLUCOSE: 189 mg/dL — AB (ref 65–99)
POTASSIUM: 4 mmol/L (ref 3.5–5.1)
Sodium: 141 mmol/L (ref 135–145)

## 2016-12-29 ENCOUNTER — Encounter (HOSPITAL_COMMUNITY): Payer: Self-pay | Admitting: Internal Medicine

## 2016-12-29 ENCOUNTER — Ambulatory Visit (HOSPITAL_COMMUNITY)
Admission: RE | Admit: 2016-12-29 | Discharge: 2016-12-29 | Disposition: A | Discharge status: Home / Self Care | Payer: Medicare Other | Source: Ambulatory Visit | Attending: Internal Medicine | Admitting: Internal Medicine

## 2016-12-29 VITALS — BP 126/90 | HR 96 | Wt 228.4 lb

## 2016-12-29 DIAGNOSIS — I130 Hypertensive heart and chronic kidney disease with heart failure and stage 1 through stage 4 chronic kidney disease, or unspecified chronic kidney disease: Secondary | ICD-10-CM | POA: Insufficient documentation

## 2016-12-29 DIAGNOSIS — Z993 Dependence on wheelchair: Secondary | ICD-10-CM | POA: Diagnosis not present

## 2016-12-29 DIAGNOSIS — I272 Pulmonary hypertension, unspecified: Secondary | ICD-10-CM | POA: Diagnosis not present

## 2016-12-29 DIAGNOSIS — N189 Chronic kidney disease, unspecified: Secondary | ICD-10-CM | POA: Insufficient documentation

## 2016-12-29 DIAGNOSIS — J9610 Chronic respiratory failure, unspecified whether with hypoxia or hypercapnia: Secondary | ICD-10-CM | POA: Insufficient documentation

## 2016-12-29 DIAGNOSIS — I5022 Chronic systolic (congestive) heart failure: Secondary | ICD-10-CM | POA: Insufficient documentation

## 2016-12-29 DIAGNOSIS — Z8249 Family history of ischemic heart disease and other diseases of the circulatory system: Secondary | ICD-10-CM | POA: Insufficient documentation

## 2016-12-29 DIAGNOSIS — G4733 Obstructive sleep apnea (adult) (pediatric): Secondary | ICD-10-CM | POA: Insufficient documentation

## 2016-12-29 DIAGNOSIS — Z6841 Body Mass Index (BMI) 40.0 and over, adult: Secondary | ICD-10-CM | POA: Insufficient documentation

## 2016-12-29 DIAGNOSIS — Z7982 Long term (current) use of aspirin: Secondary | ICD-10-CM | POA: Insufficient documentation

## 2016-12-29 DIAGNOSIS — Z79899 Other long term (current) drug therapy: Secondary | ICD-10-CM | POA: Diagnosis not present

## 2016-12-29 DIAGNOSIS — I429 Cardiomyopathy, unspecified: Secondary | ICD-10-CM | POA: Insufficient documentation

## 2016-12-29 DIAGNOSIS — Q780 Osteogenesis imperfecta: Secondary | ICD-10-CM | POA: Insufficient documentation

## 2016-12-29 MED ORDER — CARVEDILOL 6.25 MG PO TABS: 6.2500 mg | ORAL_TABLET | Freq: Two times a day (BID) | ORAL | 5 refills | Status: DC

## 2016-12-29 NOTE — Addendum Note (Signed)
Encounter addended by: Shirley Muscat, RN on: 12/29/2016 11:12 AM<BR>    Actions taken: Order list changed, Sign clinical note

## 2016-12-29 NOTE — Addendum Note (Signed)
Encounter addended by: Shirley Muscat, RN on: 12/29/2016 11:13 AM<BR>    Actions taken: Sign clinical note

## 2016-12-29 NOTE — Progress Notes (Signed)
Patient ID: Jerry Edwards, male   DOB: Aug 31, 1970, 46 y.o.   MRN: [id number]   [facility]     Primary Physician: Needs to establish.  Primary Cardiologist: [name] HF: [name]   HPI: Jerry Edwards is a 46 year old with history of NICM, Chronic systolic heart failure, HTN, OSA, and osteogenic imperfecta.   Admitted to [facility] 1/17 through 04/21/13 with increased dyspnea and mild volume overload. Question regarding possible infiltrative disease. ECHO 04/08/2013. EF 25%. cMRI 12/15 EF 38% possible infiltrative disease with discrete areas of non-coronary pattern delayed enhancement. Coronaries were normal on LHC.  SPEP no M spike and UPEP no free light chains. HIV negative and TSH nl.CT of chest no evidence of pulmonary infiltrates. D/C weight 193 pounds.   He was admitted in January 2017 with recurrent HF, severe HTN and hypoxemia. Diuresed and discharged home. Saw Dr. Acie Fredrickson a few weeks and was not taking Bidl because he couldn't afford. So given scripts for hydralazine and Imdur and now can afford. Also started on home O2 for sats in 70s.   Admitted 02/21/16 with acute respiratory failure. Treated with IV lasix but slowed down with creatinine rise. Also had hypertensive emergency on arrival requiring IV NTG. Likely due to non-compliance, as he had stopped several of his medications.    Admitted 7/23 - 10/15/16 with mild chest discomfort lasting up to 30 minutes that improved with rest. It was worse after eating but not consistent, it would also occur at rest. Occasionally associated with diaphoresis and dyspnea. Echo and Stress test as below.    Had PYP scan on 11/06/16 with ratio of 1.2 (felt to be negative) - unable to have MRI (couldnt fit)  Pt presents today for follow up. Says he feels great. Taking lasix 80/40. Weight up a couple pounds. Cutting back on ice. Does not have to take lasix. Continues to work at [employer].  Denies orthopnea or PND.     10/13/16  LVEF 35-40%, Grade 1 DD,  Mild Jerry, Mild LAE  Myoview 10/15/16  No change in EKG from baseline T wave inversions in the inferolateral leads  There is a medium defect of mild severity present in the basal inferior, mid inferior and apical inferior location. The defect is non-reversible and consistent with prior infarct.  There is a small defect of moderate severity present in the apex location. The defect is partially reversible and consistent with infarct with possible small area of peri infarct ischemia.  This is a high risk study.  EF not calculated but visually appears to be moderately to severely reduced.  Echo 1/17 30-35% RV ok  Echo 02/22/16 LVEF 40%, Grade 1 DD.    LHC/RHC 04/08/13  Normal coronaries RA pressure:  39/39 with a mean of 34. Prominent X. and Y. Descends.  RV pressure: 76/26  mean 4 PA pressure: 70/36 with a mean of 51  Pulmonary capillary wedge pressure: 31/30 and mean of 29  LV pressure: 140/26 with a left ventricular end-diastolic pressure of 35  PA sat 66%  AO sat 97%  CO/CI 4.94/2.63  Pulmonary vascular resistance: 4.45 Woods units   Labs (1/15): K 4.6, creatinine 1.25, TSH normal, UPEP negative, SPEP negative, transferrin saturation 39%, HIV negative, ACE level not elevated.  Labs 1/17: K 4.4 creatinine 0.89  PMH: 1. HTN 2. Osteogenesis imperfecta: wheelchair-bound.  History of bone fractures.  3. Suspect OSA 4. CKD 5. Nonischemic cardiomyopathy: Echo (1/15) with EF 20-25%, severe diffuse hypokinesis, mild Jerry, mildly enlarged  RV with moderately decreased systolic function.  LHC/RHC (1/15) with normal coronaries, mean RA 34, PA 70/36 mean 51, mean PCWP 29, CI 2.63.  Cardiac MRI (1/15) with EF 38%, global hypokinesis, discrete areas of delayed enhancement in the subepicardial mid inferoseptal RV insertion site, the mid wall of the mid inferolateral wall, and the subendocardial mid anterolateral wall.  This pattern was suggestive of infiltrative disease.  UPEP/SPEP negative, HIV  negative.  Chest CT to look for evidence of sarcoidosis did not show any sarcoid-type lesions.    SH:  Single, prior smoking now quit, occasional marijuana, works as Dispensing optician.   FH:  HTN, no significant cardiac disease.   Current Outpatient Prescriptions  Medication Sig Dispense Refill  . aspirin 81 MG tablet Take 1 tablet (81 mg total) by mouth daily.    . carvedilol (COREG) 6.25 MG tablet Take 0.5 tablets (3.125 mg total) by mouth 2 (two) times daily with a meal. 30 tablet 5  . furosemide (LASIX) 20 MG tablet Take 3 tablets (60 mg total) by mouth 2 (two) times daily. 180 tablet 1  . hydrALAZINE (APRESOLINE) 25 MG tablet Take 2 tablets (50 mg total) by mouth 3 (three) times daily. 135 tablet 3  . isosorbide mononitrate (IMDUR) 30 MG 24 hr tablet Take 1 tablet (30 mg total) by mouth daily. 45 tablet 3  . KLOR-CON M20 20 MEQ tablet TAKE 2 TABLETS BY MOUTH ONCE DAILY 60 tablet 3  . sacubitril-valsartan (ENTRESTO) 97-103 MG Take 1 tablet by mouth 2 (two) times daily. 180 tablet 3  . simethicone (MYLICON) 315 MG chewable tablet Chew 125 mg by mouth every 6 (six) hours as needed for flatulence.     No current facility-administered medications for this encounter.     PHYSICAL EXAM: Vitals:   12/29/16 1053  BP: 126/90  Pulse: 96  SpO2: 96%  Weight: 228 lb 6.4 oz (103.6 kg)    Wt Readings from Last 3 Encounters:  12/29/16 228 lb 6.4 oz (103.6 kg)  10/30/16 231 lb (104.8 kg)  10/15/16 225 lb 5 oz (102.2 kg)    General: NAD. Small stature. In [place]. No respiratory difficulty HEENT: Normal anicteric Neck: Supple. JVP difficult due to habitus, but does not appear elevated. Carotids 2+ bilat; no bruits. No thyromegaly or nodule noted. Cor: PMI nonpalpable RRR no m/r/g Lungs: clear  Abdomen: Markedly obese Soft, NT/ND, no HSM. No bruits or masses. +BS  Extremities: no cyanosis, clubbing, rash, edema Neuro: alert & oriented x 3, cranial nerves grossly intact. moves all 4 extremities w/o  difficulty. Affect pleasant   ASSESSMENT & PLAN: 1. Chronic Systolic Heart Failure. NICM EF 25% with no coronary disease on LHC in 1/15. 03/2013 cMRI possible infiltrative disease. SPEP and UPEP ok.  - EF 10/13/2016 35-40%. Myoview high risk due to LV dysfunction with mild inferior scar  - Based on the cMRI pattern 12/15 and concern for cardiac sarcoidosis, a chest CT was done to look for evidence of pulmonary sarcoidosis.  This was not indicative of pulmonary sarcoidosis.  Patient has had no significant arrhythmias detected.  QRS on ECG is not widened.   - Pt unable to fit in cMRI machine due to body stature.  PYP scan 8/18 reviewed personally and felt to be negative.  - NYHA class II Symptoms. - Continue lasix 80/40  - Increase coreg 6.25 mg BID - Now on  Entresto to 97/103 mg BID.  - Continue hydralazine 50 mg TID and imdur 30 mg daily.   -  Reinforced fluid restriction to < 2 L daily, sodium restriction to less than 2000 mg daily, and the importance of daily weights.   - Remains out of ICD consideration due to EF > 35% 2. Osteogenesis imperfecta  - Wheel chair bound; limited mobility. No change from HF perspective.   3. HTN. - Improved control. Meds as above.  4. OSA  - Continue nightly CPAP with O2. No change 5. Morbid Obesity - Body mass index is 47.74 kg/m.  - Encouraged need to lose weight 6. Pulmonary hypertension- This appears to be primarily pulmonary venous hypertension on previousRHC.  There may be a component of PAH related to OSA.   - No change.    7. Chronic respiratory failure - Continue O2. Will attempt to expedite portable 02 for him. - encouraged weight loss  Glori Bickers, MD  12/29/2016

## 2016-12-29 NOTE — Patient Instructions (Addendum)
Increase Carvedilol to 6.25 mg (1 tab), twice a day  Your physician recommends that you schedule a follow-up appointment in: 4-6 months with [name]  (we will call you)

## 2017-01-01 ENCOUNTER — Other Ambulatory Visit (HOSPITAL_COMMUNITY): Payer: Self-pay | Admitting: Cardiology

## 2017-01-24 ENCOUNTER — Other Ambulatory Visit: Payer: Self-pay | Admitting: Internal Medicine

## 2017-02-23 ENCOUNTER — Other Ambulatory Visit (HOSPITAL_COMMUNITY): Payer: Self-pay | Admitting: Cardiology

## 2017-02-23 ENCOUNTER — Other Ambulatory Visit: Payer: Self-pay | Admitting: Internal Medicine

## 2017-03-15 ENCOUNTER — Other Ambulatory Visit (HOSPITAL_COMMUNITY): Payer: Self-pay | Admitting: Internal Medicine

## 2017-05-11 DIAGNOSIS — H01021 Squamous blepharitis right upper eyelid: Secondary | ICD-10-CM | POA: Diagnosis not present

## 2017-05-11 DIAGNOSIS — H01024 Squamous blepharitis left upper eyelid: Secondary | ICD-10-CM | POA: Diagnosis not present

## 2017-05-11 DIAGNOSIS — H25011 Cortical age-related cataract, right eye: Secondary | ICD-10-CM | POA: Diagnosis not present

## 2017-05-11 DIAGNOSIS — H01025 Squamous blepharitis left lower eyelid: Secondary | ICD-10-CM | POA: Diagnosis not present

## 2017-05-11 DIAGNOSIS — H01022 Squamous blepharitis right lower eyelid: Secondary | ICD-10-CM | POA: Diagnosis not present

## 2017-05-12 ENCOUNTER — Encounter (HOSPITAL_COMMUNITY): Payer: Self-pay | Admitting: *Deleted

## 2017-05-12 ENCOUNTER — Encounter (HOSPITAL_COMMUNITY): Payer: Self-pay | Admitting: Internal Medicine

## 2017-05-12 ENCOUNTER — Ambulatory Visit (HOSPITAL_COMMUNITY)
Admission: RE | Admit: 2017-05-12 | Discharge: 2017-05-12 | Disposition: A | Discharge status: Home / Self Care | Payer: Medicare Other | Source: Ambulatory Visit | Attending: Internal Medicine | Admitting: Internal Medicine

## 2017-05-12 VITALS — BP 150/120 | HR 91 | Wt 227.1 lb

## 2017-05-12 DIAGNOSIS — I272 Pulmonary hypertension, unspecified: Secondary | ICD-10-CM | POA: Insufficient documentation

## 2017-05-12 DIAGNOSIS — Z7982 Long term (current) use of aspirin: Secondary | ICD-10-CM | POA: Diagnosis not present

## 2017-05-12 DIAGNOSIS — I5022 Chronic systolic (congestive) heart failure: Secondary | ICD-10-CM

## 2017-05-12 DIAGNOSIS — G4733 Obstructive sleep apnea (adult) (pediatric): Secondary | ICD-10-CM | POA: Insufficient documentation

## 2017-05-12 DIAGNOSIS — Z993 Dependence on wheelchair: Secondary | ICD-10-CM | POA: Insufficient documentation

## 2017-05-12 DIAGNOSIS — Z6841 Body Mass Index (BMI) 40.0 and over, adult: Secondary | ICD-10-CM | POA: Insufficient documentation

## 2017-05-12 DIAGNOSIS — N189 Chronic kidney disease, unspecified: Secondary | ICD-10-CM | POA: Insufficient documentation

## 2017-05-12 DIAGNOSIS — Q780 Osteogenesis imperfecta: Secondary | ICD-10-CM | POA: Diagnosis not present

## 2017-05-12 DIAGNOSIS — I429 Cardiomyopathy, unspecified: Secondary | ICD-10-CM | POA: Insufficient documentation

## 2017-05-12 DIAGNOSIS — I10 Essential (primary) hypertension: Secondary | ICD-10-CM

## 2017-05-12 DIAGNOSIS — I130 Hypertensive heart and chronic kidney disease with heart failure and stage 1 through stage 4 chronic kidney disease, or unspecified chronic kidney disease: Secondary | ICD-10-CM | POA: Insufficient documentation

## 2017-05-12 DIAGNOSIS — Z9981 Dependence on supplemental oxygen: Secondary | ICD-10-CM | POA: Diagnosis not present

## 2017-05-12 DIAGNOSIS — I110 Hypertensive heart disease with heart failure: Secondary | ICD-10-CM

## 2017-05-12 DIAGNOSIS — Z79899 Other long term (current) drug therapy: Secondary | ICD-10-CM | POA: Diagnosis not present

## 2017-05-12 DIAGNOSIS — J9601 Acute respiratory failure with hypoxia: Secondary | ICD-10-CM | POA: Insufficient documentation

## 2017-05-12 LAB — CBC
HCT: 48.1 % (ref 39.0–52.0)
Hemoglobin: 14.1 g/dL (ref 13.0–17.0)
MCH: 28.3 pg (ref 26.0–34.0)
MCHC: 29.3 g/dL — ABNORMAL LOW (ref 30.0–36.0)
MCV: 96.6 fL (ref 78.0–100.0)
PLATELETS: 226 10*3/uL (ref 150–400)
RBC: 4.98 MIL/uL (ref 4.22–5.81)
RDW: 12.4 % (ref 11.5–15.5)
WBC: 7 10*3/uL (ref 4.0–10.5)

## 2017-05-12 LAB — BASIC METABOLIC PANEL
Anion gap: 8 (ref 5–15)
BUN: 6 mg/dL (ref 6–20)
CHLORIDE: 100 mmol/L — AB (ref 101–111)
CO2: 34 mmol/L — ABNORMAL HIGH (ref 22–32)
CREATININE: 0.92 mg/dL (ref 0.61–1.24)
Calcium: 8.8 mg/dL — ABNORMAL LOW (ref 8.9–10.3)
Glucose, Bld: 113 mg/dL — ABNORMAL HIGH (ref 65–99)
POTASSIUM: 4 mmol/L (ref 3.5–5.1)
SODIUM: 142 mmol/L (ref 135–145)

## 2017-05-12 LAB — HEMOGLOBIN A1C
Hgb A1c MFr Bld: 5.3 % (ref 4.8–5.6)
Mean Plasma Glucose: 105.41 mg/dL

## 2017-05-12 MED ORDER — CARVEDILOL 6.25 MG PO TABS: 9.7500 mg | ORAL_TABLET | Freq: Two times a day (BID) | ORAL | 5 refills | Status: DC

## 2017-05-12 MED ORDER — WHEELCHAIR CUSHION MISC: 1.0000 | Freq: Once | 0 refills | Status: AC

## 2017-05-12 NOTE — Addendum Note (Signed)
Encounter addended by: Scarlette Calico, RN on: 05/12/2017 12:28 PM  Actions taken: Visit diagnoses modified, Diagnosis association updated, Order list changed

## 2017-05-12 NOTE — Patient Instructions (Signed)
Increase Carvedilol to 9.375 mg (1 & 1/2 tabs) Twice daily   Labs today  We will contact you in 5-6 months to schedule your next appointment with echocardiogram

## 2017-05-12 NOTE — Addendum Note (Signed)
Encounter addended by: Scarlette Calico, RN on: 05/12/2017 12:19 PM  Actions taken: Diagnosis association updated, Pharmacy for encounter modified, Order list changed, Sign clinical note

## 2017-05-12 NOTE — Progress Notes (Signed)
Patient ID: Jerry Edwards, male   DOB: Apr 04, 1970, 47 y.o.   MRN: [id number]   [facility]     Primary Physician: Needs to establish.  Primary Cardiologist: Jerry Edwards HF: Jerry Edwards   HPI: Jerry Edwards is a 47 year old with history of NICM, Chronic systolic heart failure, HTN, OSA, and osteogenic imperfecta.   Admitted to [facility] 1/17 through 04/21/13 with increased dyspnea and mild volume overload. Question regarding possible infiltrative disease. ECHO 04/08/2013. EF 25%. cMRI 12/15 EF 38% possible infiltrative disease with discrete areas of non-coronary pattern delayed enhancement. Coronaries were normal on LHC.  SPEP no M spike and UPEP no free light chains. HIV negative and TSH nl.CT of chest no evidence of pulmonary infiltrates. D/C weight 193 pounds.   He was admitted in January 2017 with recurrent HF, severe HTN and hypoxemia. Diuresed and discharged home. Saw Jerry Edwards a few weeks and was not taking Bidl because he couldn't afford. So given scripts for hydralazine and Imdur and now can afford. Also started on home O2 for sats in 70s.   Admitted 02/21/16 with acute respiratory failure. Treated with IV lasix but slowed down with creatinine rise. Also had hypertensive emergency on arrival requiring IV NTG. Likely due to non-compliance, as he had stopped several of his medications.    Admitted 7/23 - 10/15/16 with mild chest discomfort lasting up to 30 minutes that improved with rest. It was worse after eating but not consistent, it would also occur at rest. Occasionally associated with diaphoresis and dyspnea. Echo and Stress test as below.    Had PYP scan on 11/06/16 with ratio of 1.2 (felt to be negative) - unable to have MRI (couldnt fit)  Pt presents today for follow up. Says he feels great. Taking lasix 80/40 and urinating a lot. Weights 2x/week: 224-227 lbs. No extra lasix last couple months. Has stopped eating ice . Drinks <2 liters. Mild SOB with moderate exertion. He is WC-bound. No CP.  No orthopnea or PND. Taking BP at least 4x/week at home. BPs at home: 130/80's.  Systolics 287-867. Never in 140s.   10/13/16  LVEF 35-40%, Grade 1 DD, Mild Jerry, Mild LAE  Myoview 10/15/16  No change in EKG from baseline T wave inversions in the inferolateral leads  There is a medium defect of mild severity present in the basal inferior, mid inferior and apical inferior location. The defect is non-reversible and consistent with prior infarct.  There is a small defect of moderate severity present in the apex location. The defect is partially reversible and consistent with infarct with possible small area of peri infarct ischemia.  This is a high risk study.  EF not calculated but visually appears to be moderately to severely reduced.  Echo 1/17 30-35% RV ok  Echo 02/22/16 LVEF 40%, Grade 1 DD.    LHC/RHC 04/08/13  Normal coronaries RA pressure:  39/39 with a mean of 34. Prominent X. and Y. Descends.  RV pressure: 76/26  mean 4 PA pressure: 70/36 with a mean of 51  Pulmonary capillary wedge pressure: 31/30 and mean of 29  LV pressure: 140/26 with a left ventricular end-diastolic pressure of 35  PA sat 66%  AO sat 97%  CO/CI 4.94/2.63  Pulmonary vascular resistance: 4.45 [place] units   Labs (1/15): K 4.6, creatinine 1.25, TSH normal, UPEP negative, SPEP negative, transferrin saturation 39%, HIV negative, ACE level not elevated.  Labs 1/17: K 4.4 creatinine 0.89  PMH: 1. HTN 2. Osteogenesis imperfecta: wheelchair-bound.  History of bone fractures.  3. Suspect OSA 4. CKD 5. Nonischemic cardiomyopathy: Echo (1/15) with EF 20-25%, severe diffuse hypokinesis, mild Jerry, mildly enlarged RV with moderately decreased systolic function.  LHC/RHC (1/15) with normal coronaries, mean RA 34, PA 70/36 mean 51, mean PCWP 29, CI 2.63.  Cardiac MRI (1/15) with EF 38%, global hypokinesis, discrete areas of delayed enhancement in the subepicardial mid inferoseptal RV insertion site, the mid wall of the mid  inferolateral wall, and the subendocardial mid anterolateral wall.  This pattern was suggestive of infiltrative disease.  UPEP/SPEP negative, HIV negative.  Chest CT to look for evidence of sarcoidosis did not show any sarcoid-type lesions.    SH:  Single, prior smoking now quit, occasional marijuana, works as Dispensing optician.   FH:  HTN, no significant cardiac disease.   Current Outpatient Medications  Medication Sig Dispense Refill  . aspirin 81 MG tablet Take 1 tablet (81 mg total) by mouth daily.    . carvedilol (COREG) 6.25 MG tablet Take 1 tablet (6.25 mg total) by mouth 2 (two) times daily with a meal. 60 tablet 5  . furosemide (LASIX) 40 MG tablet TAKE 1 & 1/2 (ONE & ONE-HALF) TABLETS BY MOUTH TWICE DAILY 90 tablet 3  . hydrALAZINE (APRESOLINE) 25 MG tablet TAKE 2 TABLETS BY MOUTH THREE TIMES DAILY 135 tablet 3  . isosorbide mononitrate (IMDUR) 30 MG 24 hr tablet TAKE 1 TABLET BY MOUTH ONCE DAILY 30 tablet 3  . KLOR-CON M20 20 MEQ tablet TAKE 2 TABLETS BY MOUTH ONCE DAILY (Patient taking differently: TAKE 2 TABLETS BY MOUTH twice a day) 60 tablet 3  . sacubitril-valsartan (ENTRESTO) 97-103 MG Take 1 tablet by mouth 2 (two) times daily. 180 tablet 3  . simethicone (MYLICON) 938 MG chewable tablet Chew 125 mg by mouth every 6 (six) hours as needed for flatulence.     No current facility-administered medications for this encounter.     PHYSICAL EXAM: Vitals:   05/12/17 1111  BP: (!) 150/120  Pulse: 91  SpO2: 95%  Weight: 227 lb 1.9 oz (103 kg)    Wt Readings from Last 3 Encounters:  05/12/17 227 lb 1.9 oz (103 kg)  12/29/16 228 lb 6.4 oz (103.6 kg)  10/30/16 231 lb (104.8 kg)     General: NAD. Small stature. In [place]. No resp difficulty. HEENT: Normal Neck: Supple. JVP unable to assess due to body habitus. Carotids 2+ bilat; no bruits. No thyromegaly or nodule noted. Cor: PMI nondisplaced. RRR, No M/G/R noted Lungs: expiratory wheezes in bases Abdomen: Soft, non-tender, non-distended,  no HSM. No bruits or masses. +BS  Extremities: No cyanosis, clubbing, or rash. R and LLE no edema.  Neuro: Alert & orientedx3, cranial nerves grossly intact. moves all 4 extremities w/o difficulty. Affect pleasant   ASSESSMENT & PLAN: 1. Chronic Systolic Heart Failure. NICM EF 25% with no coronary disease on LHC in 1/15. 03/2013 cMRI possible infiltrative disease. SPEP and UPEP ok.  - EF 10/13/2016 35-40%. Myoview high risk due to LV dysfunction with mild inferior scar  - Based on the cMRI pattern 12/15 and concern for cardiac sarcoidosis, a chest CT was done to look for evidence of pulmonary sarcoidosis.  This was not indicative of pulmonary sarcoidosis.  Patient has had no significant arrhythmias detected.  QRS on ECG is not widened.   - Pt unable to fit in cMRI machine due to body stature.  PYP scan 8/18 reviewed personally and felt to be negative.  - NYHA class II  Symptoms. Volume status stable. - Continue lasix 80/40  - Increase coreg 9.375 mg BID - Continue Entresto to 97/103 mg BID.  - Continue hydralazine 50 mg TID and imdur 30 mg daily.   - Reinforced fluid restriction to < 2 L daily, sodium restriction to less than 2000 mg daily, and the importance of daily weights.   - Remains out of ICD consideration due to EF > 35% - Repeat echo 6 months  2. Osteogenesis imperfecta  - Wheel chair bound; limited mobility. No change. from HF perspective.   3. HTN. - Elevated today here but well controlled at home.  4. OSA  - Not wearing CPAP, but wears O2 at night. Wakes feeling rested. 5. Morbid Obesity - Body mass index is 47.47 kg/m. - Encouraged need to lose weight 6. Pulmonary hypertension- This appears to be primarily pulmonary venous hypertension on previousRHC.  There may be a component of PAH related to OSA.   - No change. 7. Chronic respiratory failure - Continue O2.  - encouraged weight loss  Georgiana Shore, NP  05/12/2017 11:42 AM  Patient seen and examined with the  above-signed Advanced Practice Provider and/or Housestaff. I personally reviewed laboratory data, imaging studies and relevant notes. I independently examined the patient and formulated the important aspects of the plan. I have edited the note to reflect any of my changes or salient points. I have personally discussed the plan with the patient and/or family.  Overall doing very well. Managing fluid nicely with meds and fluid restriction. BP high here but very well controlled at home. Will continue to titrate carvedilol to 9.375 bid. Will see back with echo in 5-6 months.   Glori Bickers, MD  12:04 PM

## 2017-05-16 ENCOUNTER — Other Ambulatory Visit: Payer: Self-pay | Admitting: Internal Medicine

## 2017-05-18 ENCOUNTER — Other Ambulatory Visit (HOSPITAL_COMMUNITY): Payer: Self-pay

## 2017-05-18 MED ORDER — FUROSEMIDE 40 MG PO TABS: ORAL_TABLET | ORAL | 3 refills | Status: DC

## 2017-05-26 ENCOUNTER — Other Ambulatory Visit (HOSPITAL_COMMUNITY): Payer: Self-pay | Admitting: Cardiology

## 2017-07-15 ENCOUNTER — Telehealth (HOSPITAL_COMMUNITY): Payer: Self-pay

## 2017-07-15 NOTE — Telephone Encounter (Signed)
Attempted to call patient to schedule follow up appointment with [name] and echocardiogram as he is due to be seen and [name] assistance enrollment needs to be renewed at that time as well. No answer, no VM set up to leave message. Will attempt to call again at a later time.  Renee Pain, RN

## 2017-08-06 ENCOUNTER — Other Ambulatory Visit (HOSPITAL_COMMUNITY): Payer: Self-pay | Admitting: Cardiology

## 2017-08-09 ENCOUNTER — Encounter (HOSPITAL_COMMUNITY): Payer: Self-pay

## 2017-08-09 NOTE — Progress Notes (Signed)
[employer] patient assistance renewal form mailed to patient's home (unable to get him by phone after multiple attempts).  Instructions included to complete highlighted portions and return to office in person, by fax, or by mail. Expires 08/2017

## 2017-08-21 ENCOUNTER — Other Ambulatory Visit (HOSPITAL_COMMUNITY): Payer: Self-pay | Admitting: Internal Medicine

## 2017-09-19 ENCOUNTER — Emergency Department (HOSPITAL_COMMUNITY): Payer: Medicare Other

## 2017-09-19 ENCOUNTER — Encounter (HOSPITAL_COMMUNITY): Payer: Self-pay

## 2017-09-19 ENCOUNTER — Other Ambulatory Visit: Payer: Self-pay

## 2017-09-19 ENCOUNTER — Inpatient Hospital Stay (HOSPITAL_COMMUNITY)
Admission: EM | Admit: 2017-09-19 | Discharge: 2017-09-22 | DRG: 291 | Disposition: A | Discharge status: Home / Self Care | Payer: Medicare Other | Attending: Internal Medicine | Admitting: Internal Medicine

## 2017-09-19 DIAGNOSIS — I248 Other forms of acute ischemic heart disease: Secondary | ICD-10-CM | POA: Diagnosis present

## 2017-09-19 DIAGNOSIS — Z7982 Long term (current) use of aspirin: Secondary | ICD-10-CM

## 2017-09-19 DIAGNOSIS — I5023 Acute on chronic systolic (congestive) heart failure: Secondary | ICD-10-CM | POA: Diagnosis present

## 2017-09-19 DIAGNOSIS — Z8249 Family history of ischemic heart disease and other diseases of the circulatory system: Secondary | ICD-10-CM

## 2017-09-19 DIAGNOSIS — I4581 Long QT syndrome: Secondary | ICD-10-CM | POA: Diagnosis not present

## 2017-09-19 DIAGNOSIS — E876 Hypokalemia: Secondary | ICD-10-CM | POA: Diagnosis not present

## 2017-09-19 DIAGNOSIS — Z79899 Other long term (current) drug therapy: Secondary | ICD-10-CM | POA: Diagnosis not present

## 2017-09-19 DIAGNOSIS — I10 Essential (primary) hypertension: Secondary | ICD-10-CM

## 2017-09-19 DIAGNOSIS — Z91040 Latex allergy status: Secondary | ICD-10-CM | POA: Diagnosis not present

## 2017-09-19 DIAGNOSIS — R05 Cough: Secondary | ICD-10-CM | POA: Diagnosis not present

## 2017-09-19 DIAGNOSIS — I110 Hypertensive heart disease with heart failure: Principal | ICD-10-CM | POA: Diagnosis present

## 2017-09-19 DIAGNOSIS — Z9114 Patient's other noncompliance with medication regimen: Secondary | ICD-10-CM

## 2017-09-19 DIAGNOSIS — Z9981 Dependence on supplemental oxygen: Secondary | ICD-10-CM | POA: Diagnosis not present

## 2017-09-19 DIAGNOSIS — I428 Other cardiomyopathies: Secondary | ICD-10-CM | POA: Diagnosis present

## 2017-09-19 DIAGNOSIS — Z993 Dependence on wheelchair: Secondary | ICD-10-CM | POA: Diagnosis not present

## 2017-09-19 DIAGNOSIS — G4733 Obstructive sleep apnea (adult) (pediatric): Secondary | ICD-10-CM | POA: Diagnosis present

## 2017-09-19 DIAGNOSIS — I5082 Biventricular heart failure: Secondary | ICD-10-CM | POA: Diagnosis not present

## 2017-09-19 DIAGNOSIS — Z6841 Body Mass Index (BMI) 40.0 and over, adult: Secondary | ICD-10-CM

## 2017-09-19 DIAGNOSIS — J9621 Acute and chronic respiratory failure with hypoxia: Secondary | ICD-10-CM | POA: Diagnosis not present

## 2017-09-19 DIAGNOSIS — Z91013 Allergy to seafood: Secondary | ICD-10-CM

## 2017-09-19 DIAGNOSIS — Z91010 Allergy to peanuts: Secondary | ICD-10-CM | POA: Diagnosis not present

## 2017-09-19 DIAGNOSIS — Q780 Osteogenesis imperfecta: Secondary | ICD-10-CM

## 2017-09-19 DIAGNOSIS — I272 Pulmonary hypertension, unspecified: Secondary | ICD-10-CM | POA: Diagnosis present

## 2017-09-19 DIAGNOSIS — I119 Hypertensive heart disease without heart failure: Secondary | ICD-10-CM | POA: Diagnosis present

## 2017-09-19 DIAGNOSIS — I472 Ventricular tachycardia: Secondary | ICD-10-CM | POA: Diagnosis not present

## 2017-09-19 DIAGNOSIS — Z87891 Personal history of nicotine dependence: Secondary | ICD-10-CM

## 2017-09-19 DIAGNOSIS — I5030 Unspecified diastolic (congestive) heart failure: Secondary | ICD-10-CM | POA: Diagnosis not present

## 2017-09-19 DIAGNOSIS — R0602 Shortness of breath: Secondary | ICD-10-CM | POA: Diagnosis not present

## 2017-09-19 DIAGNOSIS — I5043 Acute on chronic combined systolic (congestive) and diastolic (congestive) heart failure: Secondary | ICD-10-CM | POA: Diagnosis not present

## 2017-09-19 HISTORY — DX: Patient's other noncompliance with medication regimen: Z91.14

## 2017-09-19 HISTORY — DX: Dependence on wheelchair: Z99.3

## 2017-09-19 HISTORY — DX: Chronic systolic (congestive) heart failure: I50.22

## 2017-09-19 HISTORY — DX: Patient's other noncompliance with medication regimen for other reason: Z91.148

## 2017-09-19 HISTORY — DX: Morbid (severe) obesity due to excess calories: E66.01

## 2017-09-19 HISTORY — DX: Pulmonary hypertension, unspecified: I27.20

## 2017-09-19 LAB — COMPREHENSIVE METABOLIC PANEL
ALT: 18 U/L (ref 0–44)
ANION GAP: 9 (ref 5–15)
AST: 21 U/L (ref 15–41)
Albumin: 3.1 g/dL — ABNORMAL LOW (ref 3.5–5.0)
Alkaline Phosphatase: 51 U/L (ref 38–126)
BILIRUBIN TOTAL: 0.9 mg/dL (ref 0.3–1.2)
BUN: 11 mg/dL (ref 6–20)
CALCIUM: 8.9 mg/dL (ref 8.9–10.3)
CO2: 37 mmol/L — ABNORMAL HIGH (ref 22–32)
Chloride: 101 mmol/L (ref 98–111)
Creatinine, Ser: 1.14 mg/dL (ref 0.61–1.24)
GFR calc Af Amer: >60 mL/min (ref >60)
GLUCOSE: 100 mg/dL — AB (ref 70–99)
POTASSIUM: 3.6 mmol/L (ref 3.5–5.1)
Sodium: 147 mmol/L — ABNORMAL HIGH (ref 135–145)
TOTAL PROTEIN: 7.3 g/dL (ref 6.5–8.1)

## 2017-09-19 LAB — CBC WITH DIFFERENTIAL/PLATELET
Basophils Absolute: 0 10*3/uL (ref 0.0–0.1)
Basophils Relative: 0 %
Eosinophils Absolute: 0 10*3/uL (ref 0.0–0.7)
Eosinophils Relative: 1 %
HEMATOCRIT: 45.8 % (ref 39.0–52.0)
HEMOGLOBIN: 13.2 g/dL (ref 13.0–17.0)
LYMPHS PCT: 24 %
Lymphs Abs: 1.1 10*3/uL (ref 0.7–4.0)
MCH: 28.3 pg (ref 26.0–34.0)
MCHC: 28.8 g/dL — AB (ref 30.0–36.0)
MCV: 98.1 fL (ref 78.0–100.0)
MONO ABS: 0.4 10*3/uL (ref 0.1–1.0)
Monocytes Relative: 9 %
Neutro Abs: 3.1 10*3/uL (ref 1.7–7.7)
Neutrophils Relative %: 66 %
Platelets: 212 10*3/uL (ref 150–400)
RBC: 4.67 MIL/uL (ref 4.22–5.81)
RDW: 13.1 % (ref 11.5–15.5)
WBC: 4.6 10*3/uL (ref 4.0–10.5)

## 2017-09-19 LAB — TROPONIN I: Troponin I: 0.03 ng/mL (ref <0.03)

## 2017-09-19 LAB — BRAIN NATRIURETIC PEPTIDE

## 2017-09-19 MED ORDER — ENOXAPARIN SODIUM 40 MG/0.4ML ~~LOC~~ SOLN
40.0000 mg | Freq: Every day | SUBCUTANEOUS | Status: DC
  Administered 2017-09-20 – 2017-09-21 (×3): 40 mg via SUBCUTANEOUS
  Filled 2017-09-19 (×3): qty 0.4

## 2017-09-19 MED ORDER — FUROSEMIDE 10 MG/ML IJ SOLN: 80.0000 mg | Freq: Two times a day (BID) | INTRAMUSCULAR | Status: DC

## 2017-09-19 MED ORDER — SIMETHICONE 80 MG PO CHEW: 80.0000 mg | CHEWABLE_TABLET | Freq: Four times a day (QID) | ORAL | Status: DC | PRN

## 2017-09-19 MED ORDER — ACETAMINOPHEN 325 MG PO TABS: 650.0000 mg | ORAL_TABLET | ORAL | Status: DC | PRN

## 2017-09-19 MED ORDER — SODIUM CHLORIDE 0.9% FLUSH
3.0000 mL | Freq: Two times a day (BID) | INTRAVENOUS | Status: DC
  Administered 2017-09-20 – 2017-09-22 (×6): 3 mL via INTRAVENOUS

## 2017-09-19 MED ORDER — ASPIRIN EC 81 MG PO TBEC
81.0000 mg | DELAYED_RELEASE_TABLET | Freq: Every day | ORAL | Status: DC
  Administered 2017-09-20 – 2017-09-22 (×3): 81 mg via ORAL
  Filled 2017-09-19 (×3): qty 1

## 2017-09-19 MED ORDER — SODIUM CHLORIDE 0.9 % IV SOLN: 250.0000 mL | INTRAVENOUS | Status: DC | PRN

## 2017-09-19 MED ORDER — ISOSORBIDE MONONITRATE ER 30 MG PO TB24
30.0000 mg | ORAL_TABLET | Freq: Every day | ORAL | Status: DC
  Administered 2017-09-20 – 2017-09-22 (×3): 30 mg via ORAL
  Filled 2017-09-19 (×3): qty 1

## 2017-09-19 MED ORDER — HYDRALAZINE HCL 50 MG PO TABS
50.0000 mg | ORAL_TABLET | Freq: Three times a day (TID) | ORAL | Status: DC
  Administered 2017-09-20 – 2017-09-22 (×8): 50 mg via ORAL
  Filled 2017-09-19 (×8): qty 1

## 2017-09-19 MED ORDER — SACUBITRIL-VALSARTAN 97-103 MG PO TABS
1.0000 | ORAL_TABLET | Freq: Two times a day (BID) | ORAL | Status: DC
  Administered 2017-09-20 – 2017-09-22 (×5): 1 via ORAL
  Filled 2017-09-19 (×5): qty 1

## 2017-09-19 MED ORDER — ONDANSETRON HCL 4 MG/2ML IJ SOLN: 4.0000 mg | Freq: Four times a day (QID) | INTRAMUSCULAR | Status: DC | PRN

## 2017-09-19 MED ORDER — SODIUM CHLORIDE 0.9% FLUSH: 3.0000 mL | INTRAVENOUS | Status: DC | PRN

## 2017-09-19 MED ORDER — FUROSEMIDE 10 MG/ML IJ SOLN
80.0000 mg | Freq: Once | INTRAMUSCULAR | Status: AC
  Administered 2017-09-19: 80 mg via INTRAVENOUS
  Filled 2017-09-19: qty 8

## 2017-09-19 MED ORDER — CARVEDILOL 3.125 MG PO TABS
9.7500 mg | ORAL_TABLET | Freq: Two times a day (BID) | ORAL | Status: DC
  Administered 2017-09-20 – 2017-09-21 (×4): 9.375 mg via ORAL
  Filled 2017-09-19 (×4): qty 1

## 2017-09-19 MED ORDER — HYDRALAZINE HCL 50 MG PO TABS: 50.0000 mg | ORAL_TABLET | Freq: Three times a day (TID) | ORAL | Status: DC
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
## 2017-09-19 NOTE — ED Notes (Signed)
Date and time results received: 09/19/17 10:03 PM (use smartphrase ".now" to insert current time)  Test: Troponin Critical Value: 0.03  Name of Provider Notified: Lita Mains  Orders Received? Or Actions Taken?: No new orders at this time
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
## 2017-09-19 NOTE — ED Triage Notes (Signed)
Pt presents to ED from home for shortness of breath and fluid retention. Pt has hx of CHF, and reports that he thinks he has gained about 5 lbs this week. Pt wears 2L [place] at home, but reports that he didn't feel like it was helping at all. Pt denies recent illness.

## 2017-09-20 ENCOUNTER — Encounter (HOSPITAL_COMMUNITY): Payer: Self-pay | Admitting: Physician Assistant

## 2017-09-20 ENCOUNTER — Inpatient Hospital Stay (HOSPITAL_COMMUNITY): Payer: Medicare Other

## 2017-09-20 DIAGNOSIS — I5023 Acute on chronic systolic (congestive) heart failure: Secondary | ICD-10-CM

## 2017-09-20 DIAGNOSIS — I5030 Unspecified diastolic (congestive) heart failure: Secondary | ICD-10-CM

## 2017-09-20 DIAGNOSIS — Q780 Osteogenesis imperfecta: Secondary | ICD-10-CM

## 2017-09-20 DIAGNOSIS — I10 Essential (primary) hypertension: Secondary | ICD-10-CM

## 2017-09-20 LAB — BASIC METABOLIC PANEL
Anion gap: 8 (ref 5–15)
BUN: 12 mg/dL (ref 6–20)
CHLORIDE: 98 mmol/L (ref 98–111)
CO2: 38 mmol/L — ABNORMAL HIGH (ref 22–32)
Calcium: 8.9 mg/dL (ref 8.9–10.3)
Creatinine, Ser: 1.05 mg/dL (ref 0.61–1.24)
GFR calc Af Amer: >60 mL/min (ref >60)
GLUCOSE: 136 mg/dL — AB (ref 70–99)
POTASSIUM: 3.1 mmol/L — AB (ref 3.5–5.1)
Sodium: 144 mmol/L (ref 135–145)

## 2017-09-20 LAB — ECHOCARDIOGRAM COMPLETE
Height: 56 in
Weight: 3566.16 oz

## 2017-09-20 LAB — BRAIN NATRIURETIC PEPTIDE: B NATRIURETIC PEPTIDE 5: 3891.6 pg/mL — AB (ref 0.0–100.0)

## 2017-09-20 LAB — MAGNESIUM: MAGNESIUM: 2.2 mg/dL (ref 1.7–2.4)

## 2017-09-20 LAB — TSH: TSH: 0.574 u[IU]/mL (ref 0.350–4.500)

## 2017-09-20 LAB — TROPONIN I: TROPONIN I: 0.04 ng/mL — AB (ref <0.03)

## 2017-09-20 MED ORDER — FUROSEMIDE 10 MG/ML IJ SOLN
80.0000 mg | Freq: Three times a day (TID) | INTRAMUSCULAR | Status: DC
  Administered 2017-09-20 – 2017-09-21 (×4): 80 mg via INTRAVENOUS
  Filled 2017-09-20 (×3): qty 8

## 2017-09-20 MED ORDER — PERFLUTREN LIPID MICROSPHERE
1.0000 mL | INTRAVENOUS | Status: AC | PRN
  Filled 2017-09-20: qty 10

## 2017-09-20 MED ORDER — POTASSIUM CHLORIDE CRYS ER 20 MEQ PO TBCR
40.0000 meq | EXTENDED_RELEASE_TABLET | Freq: Every day | ORAL | Status: DC
  Administered 2017-09-21: 40 meq via ORAL
  Filled 2017-09-20: qty 2

## 2017-09-20 MED ORDER — POTASSIUM CHLORIDE CRYS ER 20 MEQ PO TBCR
40.0000 meq | EXTENDED_RELEASE_TABLET | Freq: Three times a day (TID) | ORAL | Status: AC
  Administered 2017-09-20 (×3): 40 meq via ORAL
  Filled 2017-09-20 (×3): qty 2

## 2017-09-20 MED ORDER — ORAL CARE MOUTH RINSE
15.0000 mL | Freq: Two times a day (BID) | OROMUCOSAL | Status: DC
  Administered 2017-09-20 – 2017-09-22 (×4): 15 mL via OROMUCOSAL

## 2017-09-20 MED ORDER — SPIRONOLACTONE 25 MG PO TABS
25.0000 mg | ORAL_TABLET | Freq: Every day | ORAL | Status: DC
  Administered 2017-09-20 – 2017-09-22 (×3): 25 mg via ORAL
  Filled 2017-09-20 (×3): qty 1

## 2017-09-20 MED ORDER — MAGNESIUM OXIDE 400 (241.3 MG) MG PO TABS
800.0000 mg | ORAL_TABLET | Freq: Once | ORAL | Status: AC
  Administered 2017-09-20: 800 mg via ORAL
  Filled 2017-09-20: qty 2

## 2017-09-20 NOTE — Progress Notes (Signed)
Pt didn't like the CPAP and stated that he doesn't wear his at home. Pt said that he would sit up. RT left the CPAP in the room. RT put the CPAP on 5 Pressure. RT will continue to monitor

## 2017-09-20 NOTE — Progress Notes (Signed)
  Echocardiogram 2D Echocardiogram has been performed.  Jerry Edwards 09/20/2017, 4:31 PM

## 2017-09-20 NOTE — Progress Notes (Addendum)
Pt refused to wear CPAP, pt had 4 beats of wide QRS complexes, denies SOB, O2 on @3 .5 liters.  Pt noted to have periods or sleep apnea. MD made aware Hinton Dyer, [facility]) will cont to monitor. SRP, RN

## 2017-09-20 NOTE — Progress Notes (Signed)
RN made aware by central telemetry that pt had a 2.46 second pause and HR dropped to 43, then increased to 96. Vitals stable and pt asymptomatic. [place] NP notified. Will continue to monitor pt and carry out any new orders.
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
## 2017-09-20 NOTE — Progress Notes (Signed)
PROGRESS NOTE    Jerry Edwards  ASN:[id number] DOB: 1970-10-22 DOA: 09/19/2017 PCP: Patient, No Pcp Per   Brief Narrative:  47 year old male with a history of obstructive sleep apnea, osteogenic imperfecta, pulmonary hypertension, morbid obesity, medication noncompliance, history of nonischemic cardiomyopathy with congestive systolic heart failure with ejection fraction 40% came to the hospital with complains of increasing shortness of breath and lower extremity edema for past several days.  He was admitted to the hospital for acute on chronic systolic heart failure.   Assessment & Plan:   Principal Problem:   Acute on chronic systolic heart failure (HCC) Active Problems:   Hypertensive heart disease   Osteogenesis imperfecta   Obstructive sleep apnea   Acute on chronic respiratory failure with hypoxia (HCC)   Morbid obesity (HCC)   Essential hypertension  Acute respiratory distress Acute on chronic systolic congestive heart failure, ejection fraction 40%, class III - We will continue patient on Lasix 80 mg IV every 8 hours.  If poor response will have to change the diuretic - Aggressively replete electrolytes-potassium and magnesium -Daily weight, fluid restriction, strict input and output -Repeating echocardiogram - Cardiology has been consulted -Continue Coreg, isosorbide mononitrate, Entresto  Elevated troponin - This is due to demand ischemia in the setting of fluid overload.  Remains chest pain-free.  Closely monitor.  Essential hypertension -She is on Coreg, Lasix, hydralazine, isosorbide mononitrate and Entresto  Obstructive sleep apnea -Apparently had ~3 second pause on telemetry overnight. - CPAP machine  Osteogenesis imperfecta -Bedrest.  Supportive care  DVT prophylaxis: Lovenox Code Status: Full code Family Communication: None at bedside Disposition Plan: Maintain inpatient stay  Consultants:   Cardiology  Procedures:   None  Antimicrobials:    None   Subjective: Feels a little better but still has lower extremity swelling.  Review of Systems Otherwise negative except as per HPI, including: General: Denies fever, chills, night sweats or unintended weight loss. Resp: Denies cough, wheezing, shortness of breath. Cardiac: Denies chest pain, palpitations, orthopnea, paroxysmal nocturnal dyspnea. GI: Denies abdominal pain, nausea, vomiting, diarrhea or constipation GU: Denies dysuria, frequency, hesitancy or incontinence MS: Denies muscle aches, joint pain  Neuro: Denies headache, neurologic deficits (focal weakness, numbness, tingling), abnormal gait Psych: Denies anxiety, depression, SI/HI/AVH Skin: Denies new rashes or lesions ID: Denies sick contacts, exotic exposures, travel  Objective: Vitals:   09/19/17 2349 09/19/17 2358 09/20/17 0536 09/20/17 0638  BP:  (!) 157/99 (!) 152/98 (!) 150/87  Pulse:  75 90 83  Resp:  20 20   Temp:  98.4 F (36.9 C) 98.4 F (36.9 C)   TempSrc:  Oral Oral   SpO2:  92% 91%   Weight: 101.1 kg (222 lb 14.2 oz)     Height: 4\' 8"  (1.422 m)       Intake/Output Summary (Last 24 hours) at 09/20/2017 1246 Last data filed at 09/20/2017 0600 Gross per 24 hour  Intake 0 ml  Output 500 ml  Net -500 ml   Filed Weights   09/19/17 2038 09/19/17 2349  Weight: 102.1 kg (225 lb) 101.1 kg (222 lb 14.2 oz)    Examination: General exam: Appears calm and comfortable  Respiratory system: Bibasilar crackles Cardiovascular system: S1 & S2 heard, RRR. No JVD, murmurs, rubs, gallops or clicks.  2+ bilateral lower extremity pitting edema Gastrointestinal system: Abdomen is nondistended, soft and nontender. No organomegaly or masses felt. Normal bowel sounds heard. Central nervous system: Alert and oriented. No focal neurological deficits. Extremities: Symmetric 5 x 5 power.  Joint deformity of extremities due to osteogenesis imperfecta Skin: No rashes, lesions or ulcers Psychiatry: Judgement and  insight appear normal. Mood & affect appropriate.     Data Reviewed:   CBC: Recent Labs  Lab 09/19/17 2108  WBC 4.6  NEUTROABS 3.1  HGB 13.2  HCT 45.8  MCV 98.1  PLT 196   Basic Metabolic Panel: Recent Labs  Lab 09/19/17 2108 09/20/17 0354  NA 147* 144  K 3.6 3.1*  CL 101 98  CO2 37* 38*  GLUCOSE 100* 136*  BUN 11 12  CREATININE 1.14 1.05  CALCIUM 8.9 8.9   GFR: Estimated Creatinine Clearance: 79.8 mL/min (by C-G formula based on SCr of 1.05 mg/dL). Liver Function Tests: Recent Labs  Lab 09/19/17 2108  AST 21  ALT 18  ALKPHOS 51  BILITOT 0.9  PROT 7.3  ALBUMIN 3.1*   No results for input(s): LIPASE, AMYLASE in the last 168 hours. No results for input(s): AMMONIA in the last 168 hours. Coagulation Profile: No results for input(s): INR, PROTIME in the last 168 hours. Cardiac Enzymes: Recent Labs  Lab 09/19/17 2108  TROPONINI 0.03*   BNP (last 3 results) No results for input(s): PROBNP in the last 8760 hours. HbA1C: No results for input(s): HGBA1C in the last 72 hours. CBG: No results for input(s): GLUCAP in the last 168 hours. Lipid Profile: No results for input(s): CHOL, HDL, LDLCALC, TRIG, CHOLHDL, LDLDIRECT in the last 72 hours. Thyroid Function Tests: No results for input(s): TSH, T4TOTAL, FREET4, T3FREE, THYROIDAB in the last 72 hours. Anemia Panel: No results for input(s): VITAMINB12, FOLATE, FERRITIN, TIBC, IRON, RETICCTPCT in the last 72 hours. Sepsis Labs: No results for input(s): PROCALCITON, LATICACIDVEN in the last 168 hours.  No results found for this or any previous visit (from the past 240 hour(s)).       Radiology Studies: Dg Chest Port 1 View  Result Date: 09/19/2017 CLINICAL DATA:  Shortness of breath EXAM: PORTABLE CHEST 1 VIEW COMPARISON:  10/12/2016 FINDINGS: Cardiac shadow remains enlarged. Lungs are hypoaerated bilaterally. Mild central vascular crowding is noted. No focal infiltrate is seen. No acute bony abnormality  is noted. IMPRESSION: Overall poor inspiratory effort with vascular crowding. No acute abnormality noted. Electronically Signed   By: Inez Catalina M.D.   On: 09/19/2017 21:27        Scheduled Meds: . aspirin EC  81 mg Oral Daily  . carvedilol  9.375 mg Oral BID WC  . enoxaparin (LOVENOX) injection  40 mg Subcutaneous QHS  . furosemide  80 mg Intravenous Q8H  . hydrALAZINE  50 mg Oral TID  . isosorbide mononitrate  30 mg Oral Daily  . mouth rinse  15 mL Mouth Rinse BID  . potassium chloride  40 mEq Oral TID  . sacubitril-valsartan  1 tablet Oral BID  . sodium chloride flush  3 mL Intravenous Q12H   Continuous Infusions: . sodium chloride       LOS: 1 day    I have spent 25 minutes face to face with the patient and on the ward discussing the patients care, assessment, plan and disposition with other care givers. >50% of the time was devoted counseling the patient about the risks and benefits of treatment and coordinating care.     Ankit Arsenio Loader, MD [facility] Pager [phone]   If 7PM-7AM, please contact night-coverage [email or web address] Password [facility] 09/20/2017, 12:46 PM

## 2017-09-20 NOTE — Progress Notes (Signed)
Attempted to perform echo.  Patient in the rest room for an extended period of time.  Will attempt at a later time.

## 2017-09-20 NOTE — Progress Notes (Signed)
PA< Hinton Dyer made aware of updated labs per request, no new orders received. Will cont to monitor. SRP, RN

## 2017-09-21 DIAGNOSIS — J9621 Acute and chronic respiratory failure with hypoxia: Secondary | ICD-10-CM

## 2017-09-21 LAB — BASIC METABOLIC PANEL
Anion gap: 6 (ref 5–15)
BUN: 13 mg/dL (ref 6–20)
CALCIUM: 9 mg/dL (ref 8.9–10.3)
CO2: 42 mmol/L — ABNORMAL HIGH (ref 22–32)
Chloride: 99 mmol/L (ref 98–111)
Creatinine, Ser: 1.06 mg/dL (ref 0.61–1.24)
GFR calc Af Amer: >60 mL/min (ref >60)
Glucose, Bld: 102 mg/dL — ABNORMAL HIGH (ref 70–99)
Potassium: 4.2 mmol/L (ref 3.5–5.1)
SODIUM: 147 mmol/L — AB (ref 135–145)

## 2017-09-21 LAB — CBC
HEMATOCRIT: 44.9 % (ref 39.0–52.0)
Hemoglobin: 13 g/dL (ref 13.0–17.0)
MCH: 28.5 pg (ref 26.0–34.0)
MCHC: 29 g/dL — AB (ref 30.0–36.0)
MCV: 98.5 fL (ref 78.0–100.0)
Platelets: 190 10*3/uL (ref 150–400)
RBC: 4.56 MIL/uL (ref 4.22–5.81)
RDW: 13.2 % (ref 11.5–15.5)
WBC: 5.8 10*3/uL (ref 4.0–10.5)

## 2017-09-21 LAB — MAGNESIUM: MAGNESIUM: 2.4 mg/dL (ref 1.7–2.4)

## 2017-09-21 MED ORDER — FUROSEMIDE 40 MG PO TABS
80.0000 mg | ORAL_TABLET | Freq: Every day | ORAL | Status: DC
  Administered 2017-09-22: 80 mg via ORAL
  Filled 2017-09-21: qty 2

## 2017-09-21 MED ORDER — FUROSEMIDE 40 MG PO TABS
40.0000 mg | ORAL_TABLET | Freq: Every evening | ORAL | Status: DC
  Administered 2017-09-21: 40 mg via ORAL
  Filled 2017-09-21: qty 1

## 2017-09-21 MED ORDER — POTASSIUM CHLORIDE CRYS ER 20 MEQ PO TBCR
20.0000 meq | EXTENDED_RELEASE_TABLET | Freq: Every day | ORAL | Status: DC
  Administered 2017-09-22: 20 meq via ORAL
  Filled 2017-09-21: qty 1

## 2017-09-21 NOTE — Progress Notes (Addendum)
Progress Note  Patient Name: Jerry Edwards Date of Encounter: 09/21/2017  Primary Cardiologist: Glori Bickers, MD  Subjective   Patient is feeling much better. BP looks good. Asked nurse to re-weigh patient this AM as the weight earlier did not make sense. Repeat weight with standard bed weight was down 5lb from yesterday which corroborates with improvement in symptoms.  Inpatient Medications    Scheduled Meds: . aspirin EC  81 mg Oral Daily  . carvedilol  9.375 mg Oral BID WC  . enoxaparin (LOVENOX) injection  40 mg Subcutaneous QHS  . furosemide  80 mg Intravenous Q8H  . hydrALAZINE  50 mg Oral TID  . isosorbide mononitrate  30 mg Oral Daily  . mouth rinse  15 mL Mouth Rinse BID  . potassium chloride SA  40 mEq Oral Daily  . sacubitril-valsartan  1 tablet Oral BID  . sodium chloride flush  3 mL Intravenous Q12H  . spironolactone  25 mg Oral Daily   Continuous Infusions: . sodium chloride     PRN Meds: sodium chloride, acetaminophen, ondansetron (ZOFRAN) IV, simethicone, sodium chloride flush   Vital Signs    Vitals:   09/20/17 2115 09/20/17 2220 09/20/17 2252 09/21/17 0527  BP: 115/66  130/61 115/68  Pulse: 76 77  69  Resp: 20 18  15   Temp: 98.2 F (36.8 C)   98.2 F (36.8 C)  TempSrc: Oral   Oral  SpO2: 94% 96%  90%  Weight:    229 lb 4.5 oz (104 kg)  Height:        Intake/Output Summary (Last 24 hours) at 09/21/2017 1206 Last data filed at 09/21/2017 0859 Gross per 24 hour  Intake 1443 ml  Output 2625 ml  Net -1182 ml   Filed Weights   09/19/17 2038 09/19/17 2349 09/21/17 0527  Weight: 225 lb (102.1 kg) 222 lb 14.2 oz (101.1 kg) 229 lb 4.5 oz (104 kg)    Telemetry    NSR 2 brief runs NSVT - Personally Reviewed  Physical Exam   GEN: No acute distress.  HEENT: Normocephalic, atraumatic, sclera non-icteric. Neck: No JVD or bruits. Cardiac: RRR no murmurs, rubs, or gallops.  Radials/DP/PT 1+ and equal bilaterally.  Respiratory: Clear to  auscultation bilaterally. Breathing is unlabored. GI: Soft, nontender, non-distended, BS +x 4. MS: MSK deformities c/w known musculoskeletal disorder Extremities: No clubbing or cyanosis. Improved 1+ BLE edema.  Neuro:  AAOx3. Follows commands. Psych:  Responds to questions appropriately with a normal affect.  Labs    Chemistry Recent Labs  Lab 09/19/17 2108 09/20/17 0354 09/21/17 0430  NA 147* 144 147*  K 3.6 3.1* 4.2  CL 101 98 99  CO2 37* 38* 42*  GLUCOSE 100* 136* 102*  BUN 11 12 13   CREATININE 1.14 1.05 1.06  CALCIUM 8.9 8.9 9.0  PROT 7.3  --   --   ALBUMIN 3.1*  --   --   AST 21  --   --   ALT 18  --   --   ALKPHOS 51  --   --   BILITOT 0.9  --   --   GFRNONAA >60 >60 >60  GFRAA >60 >60 >60  ANIONGAP 9 8 6      Hematology Recent Labs  Lab 09/19/17 2108 09/21/17 0430  WBC 4.6 5.8  RBC 4.67 4.56  HGB 13.2 13.0  HCT 45.8 44.9  MCV 98.1 98.5  MCH 28.3 28.5  MCHC 28.8* 29.0*  RDW 13.1 13.2  PLT  212 190    Cardiac Enzymes Recent Labs  Lab 09/19/17 2108 09/20/17 1207  TROPONINI 0.03* 0.04*   No results for input(s): TROPIPOC in the last 168 hours.   BNP Recent Labs  Lab 09/19/17 2108 09/20/17 1207  BNP >4,500.0* 3,891.6*     DDimer No results for input(s): DDIMER in the last 168 hours.   Radiology    Dg Chest Port 1 View  Result Date: 09/19/2017 CLINICAL DATA:  Shortness of breath EXAM: PORTABLE CHEST 1 VIEW COMPARISON:  10/12/2016 FINDINGS: Cardiac shadow remains enlarged. Lungs are hypoaerated bilaterally. Mild central vascular crowding is noted. No focal infiltrate is seen. No acute bony abnormality is noted. IMPRESSION: Overall poor inspiratory effort with vascular crowding. No acute abnormality noted. Electronically Signed   By: Inez Catalina M.D.   On: 09/19/2017 21:27    Cardiac Studies   2d echo Study Conclusions  - Left ventricle: Wall thickness was increased in a pattern of   severe LVH. Systolic function was severely reduced.  The estimated   ejection fraction was in the range of 25% to 30%. Severe   hypokinesis of the mid-apicalanteroseptal myocardium. Features   are consistent with a pseudonormal left ventricular filling   pattern, with concomitant abnormal relaxation and increased   filling pressure (grade 2 diastolic dysfunction). - Left atrium: The atrium was severely dilated. - Right ventricle: The cavity size was mildly dilated. - Right atrium: The atrium was moderately to severely dilated.  Patient Profile     47 y.o. male wheelchair-bound male with a hx of NICM,chronic systolic heart failure, HTN, OSA, osteogenic imperfecta, pulm HTN (felt primarily venous pulm HTN on prior RHC, may be component of PAH 2/2 OSA), morbid obesity, prior noncompliance who is being seen today for the evaluation of CHF at the request of Dr. Alcario Drought.  Assessment & Plan    1. Acute on chronic systolic CHF/NICM - unclear precipitant except perhaps drinking more fluids because of heat outside. Clinically improving with diuresis. Continue with IV Lasix pending review with Dr. Marlou Porch. EF has further reduced from prior - will discuss what further workup needs to be engaged given the wall motion abnormality. Spironolactone initiated. Suspect we can change to oral Lasix today or perhaps even consider torsemide. CO2 rising - has had hypercarbia on BMET since 4 months prior. Will reduce KCl to 62meq daily.  2. Elevated troponin - no recent CP noted. He did have abnormal nuc in 2018 but appears this was managed medically in context of normal LHC in 2015. Likely demand from CHF.  3. HTN - BP looks much better with addition of spironolactone.  4. OSA - brief pause noted overnight earlier in admission - will order home CPAP for his OSA as this is likely related.   5. ?Prolonged QT interval - previously difficult to discern where the T wave ended so diagnosis not clear. Now a little easier to see on tele this AM using enlargement features.  QTc 411ms. K and Mg OK. Brief NSVT noted, continue beta blocker.  6. Hypokalemia - as above, being repleted by IM.  For questions or updates, please contact [place] Please consult [email or web address] for contact info under Cardiology/STEMI.  Signed, Charlie Pitter, PA-C 09/21/2017, 12:06 PM    Personally seen and examined. Agree with above.  Laying comfortably flat in bed.  No significant shortness of breath at this point.  Much improved.  No chest pain.  GEN: Well nourished, well developed, in no acute distress Obesity HEENT:  normal  Neck: no JVD, carotid bruits, or masses Cardiac: RRR; no murmurs, rubs, or gallops,no edema  Respiratory:  clear to auscultation bilaterally, normal work of breathing GI: soft, nontender, nondistended, + BS MS:'s short stature, osteogenesis imperfecta  Skin: warm and dry, no rash Neuro:  Alert and Oriented x 3, Strength and sensation are intact Psych: euthymic mood, full affect  Acute on chronic systolic heart failure -Echocardiogram EF 25%. -Continue with aggressive heart failure management.  We have added Spironolactone 25 mg during this admission.  See medications above. -We will check another basic metabolic profile tomorrow to make sure that his potassium is normal. - We will change him over to p.o. Lasix 80/40pm, home dose  Hopeful discharge tomorrow.  Candee Furbish, MD

## 2017-09-21 NOTE — Progress Notes (Signed)
PROGRESS NOTE    Jerry Edwards  NTZ:[id number] DOB: [birth year] DOA: 09/19/2017 PCP: Patient, No Pcp Per   Brief Narrative:  47 year old male with a history of obstructive sleep apnea, osteogenic imperfecta, pulmonary hypertension, morbid obesity, medication noncompliance, history of nonischemic cardiomyopathy with congestive systolic heart failure with ejection fraction 40% came to the hospital with complains of increasing shortness of breath and lower extremity edema for past several days.  He was admitted to the hospital for acute on chronic systolic heart failure.  He has been getting diuresed during his hospital stay.  Echocardiogram shows slight worsening of his ejection fraction and grade 2 diastolic dysfunction.  Cardiology has been following.   Assessment & Plan:   Principal Problem:   Acute on chronic systolic heart failure (HCC) Active Problems:   Hypertensive heart disease   Osteogenesis imperfecta   Obstructive sleep apnea   Acute on chronic respiratory failure with hypoxia (HCC)   Morbid obesity (HCC)   Essential hypertension  Acute respiratory distress; improved Acute on chronic hypoxia, 3 L nasal cannula.  Uses 2 L nasal cannula at home Acute on chronic systolic congestive heart failure, ejection fraction 40%, class III - Continue IV diuresis with Lasix 80 mg every 8 hours.  Will change if necessary due to poor response.  Aldactone 25 mg daily has been added - Replete electrolytes as necessary.  Daily weight, fluid restriction and strict input output -Repeat echocardiogram shows ejection fraction 20-25% with grade 2 diastolic dysfunction which is lower than about a year ago.  Will await for cardiology input for further work-up as indicated -Continue Coreg, isosorbide mononitrate, Entresto  Elevated troponin - This is due to demand ischemia in the setting of fluid overload.  Remains chest pain-free.  Closely monitor.  Essential hypertension -She is on Coreg, Lasix,  hydralazine, isosorbide mononitrate and Entresto  Obstructive sleep apnea -Apparently had ~3 second pause on telemetry overnight. - CPAP machine  Osteogenesis imperfecta -Bedrest.  Supportive care  DVT prophylaxis: Lovenox Code Status: Full code Family Communication: None at bedside Disposition Plan: Maintain hospital stay for heart failure treatment and cardiology evaluation.  Consultants:   Cardiology  Procedures:   None  Antimicrobials:   None   Subjective: State he feels a little better this morning and his lower extremity swelling is improved.  Review of Systems Otherwise negative except as per HPI, including: General = no fevers, chills, dizziness, malaise, fatigue HEENT/EYES = negative for pain, redness, loss of vision, double vision, blurred vision, loss of hearing, sore throat, hoarseness, dysphagia Cardiovascular= negative for chest pain, palpitation, murmurs, lower extremity swelling Respiratory/lungs= negative for shortness of breath, cough, hemoptysis, wheezing, mucus production Gastrointestinal= negative for nausea, vomiting,, abdominal pain, melena, hematemesis Genitourinary= negative for Dysuria, Hematuria, Change in Urinary Frequency MSK = Negative for arthralgia, myalgias, Back Pain, Joint swelling  Neurology= Negative for headache, seizures, numbness, tingling  Psychiatry= Negative for anxiety, depression, suicidal and homocidal ideation Allergy/Immunology= Medication/Food allergy as listed  Skin= Negative for Rash, lesions, ulcers, itching  Objective: Vitals:   09/20/17 2220 09/20/17 2252 09/21/17 0527 09/21/17 1220  BP:  130/61 115/68   Pulse: 77  69   Resp: 18  15   Temp:   98.2 F (36.8 C)   TempSrc:   Oral   SpO2: 96%  90%   Weight:   104 kg (229 lb 4.5 oz) 98.7 kg (217 lb 9.5 oz)  Height:        Intake/Output Summary (Last 24 hours) at 09/21/2017 1222 Last  data filed at 09/21/2017 0859 Gross per 24 hour  Intake 1443 ml  Output 2625 ml   Net -1182 ml   Filed Weights   09/19/17 2349 09/21/17 0527 09/21/17 1220  Weight: 101.1 kg (222 lb 14.2 oz) 104 kg (229 lb 4.5 oz) 98.7 kg (217 lb 9.5 oz)    Examination: Constitutional: NAD, calm, comfortable, on 3 L nasal cannula Eyes: PERRL, lids and conjunctivae normal ENMT: Mucous membranes are moist. Posterior pharynx clear of any exudate or lesions.Normal dentition.  Neck: normal, supple, no masses, no thyromegaly Respiratory: Diminished breath sounds midway up the lung field Cardiovascular: Regular rate and rhythm, no murmurs / rubs / gallops. No extremity edema. 2+ pedal pulses. No carotid bruits.  Abdomen: no tenderness, no masses palpated. No hepatosplenomegaly. Bowel sounds positive.  Musculoskeletal: Chronic lower extremity joint deformity due to osteogenesis imperfecta Skin: no rashes, lesions, ulcers. No induration Neurologic: CN 2-12 grossly intact. Sensation intact, DTR normal. Strength 5/5 in all 4.  Psychiatric: Normal judgment and insight. Alert and oriented x 3. Normal mood.   Data Reviewed:   CBC: Recent Labs  Lab 09/19/17 2108 09/21/17 0430  WBC 4.6 5.8  NEUTROABS 3.1  --   HGB 13.2 13.0  HCT 45.8 44.9  MCV 98.1 98.5  PLT 212 161   Basic Metabolic Panel: Recent Labs  Lab 09/19/17 2108 09/20/17 0354 09/20/17 1207 09/21/17 0430  NA 147* 144  --  147*  K 3.6 3.1*  --  4.2  CL 101 98  --  99  CO2 37* 38*  --  42*  GLUCOSE 100* 136*  --  102*  BUN 11 12  --  13  CREATININE 1.14 1.05  --  1.06  CALCIUM 8.9 8.9  --  9.0  MG  --   --  2.2 2.4   GFR: Estimated Creatinine Clearance: 78 mL/min (by C-G formula based on SCr of 1.06 mg/dL). Liver Function Tests: Recent Labs  Lab 09/19/17 2108  AST 21  ALT 18  ALKPHOS 51  BILITOT 0.9  PROT 7.3  ALBUMIN 3.1*   No results for input(s): LIPASE, AMYLASE in the last 168 hours. No results for input(s): AMMONIA in the last 168 hours. Coagulation Profile: No results for input(s): INR, PROTIME in  the last 168 hours. Cardiac Enzymes: Recent Labs  Lab 09/19/17 2108 09/20/17 1207  TROPONINI 0.03* 0.04*   BNP (last 3 results) No results for input(s): PROBNP in the last 8760 hours. HbA1C: No results for input(s): HGBA1C in the last 72 hours. CBG: No results for input(s): GLUCAP in the last 168 hours. Lipid Profile: No results for input(s): CHOL, HDL, LDLCALC, TRIG, CHOLHDL, LDLDIRECT in the last 72 hours. Thyroid Function Tests: Recent Labs    09/20/17 1207  TSH 0.574   Anemia Panel: No results for input(s): VITAMINB12, FOLATE, FERRITIN, TIBC, IRON, RETICCTPCT in the last 72 hours. Sepsis Labs: No results for input(s): PROCALCITON, LATICACIDVEN in the last 168 hours.  No results found for this or any previous visit (from the past 240 hour(s)).       Radiology Studies: Dg Chest Port 1 View  Result Date: 09/19/2017 CLINICAL DATA:  Shortness of breath EXAM: PORTABLE CHEST 1 VIEW COMPARISON:  10/12/2016 FINDINGS: Cardiac shadow remains enlarged. Lungs are hypoaerated bilaterally. Mild central vascular crowding is noted. No focal infiltrate is seen. No acute bony abnormality is noted. IMPRESSION: Overall poor inspiratory effort with vascular crowding. No acute abnormality noted. Electronically Signed   By: Inez Catalina  M.D.   On: 09/19/2017 21:27        Scheduled Meds: . aspirin EC  81 mg Oral Daily  . carvedilol  9.375 mg Oral BID WC  . enoxaparin (LOVENOX) injection  40 mg Subcutaneous QHS  . furosemide  80 mg Intravenous Q8H  . hydrALAZINE  50 mg Oral TID  . isosorbide mononitrate  30 mg Oral Daily  . mouth rinse  15 mL Mouth Rinse BID  . potassium chloride SA  40 mEq Oral Daily  . sacubitril-valsartan  1 tablet Oral BID  . sodium chloride flush  3 mL Intravenous Q12H  . spironolactone  25 mg Oral Daily   Continuous Infusions: . sodium chloride       LOS: 2 days    I have spent 30 minutes face to face with the patient and on the ward discussing the  patients care, assessment, plan and disposition with other care givers. >50% of the time was devoted counseling the patient about the risks and benefits of treatment and coordinating care.     Mica Releford Arsenio Loader, MD [facility] Pager [phone]   If 7PM-7AM, please contact night-coverage [email or web address] Password [place] 09/21/2017, 12:22 PM

## 2017-09-22 ENCOUNTER — Telehealth (HOSPITAL_COMMUNITY): Payer: Self-pay | Admitting: *Deleted

## 2017-09-22 ENCOUNTER — Other Ambulatory Visit: Payer: Self-pay | Admitting: Physician Assistant

## 2017-09-22 DIAGNOSIS — I5022 Chronic systolic (congestive) heart failure: Secondary | ICD-10-CM

## 2017-09-22 DIAGNOSIS — I5082 Biventricular heart failure: Secondary | ICD-10-CM

## 2017-09-22 DIAGNOSIS — I110 Hypertensive heart disease with heart failure: Principal | ICD-10-CM

## 2017-09-22 DIAGNOSIS — G4733 Obstructive sleep apnea (adult) (pediatric): Secondary | ICD-10-CM

## 2017-09-22 DIAGNOSIS — I10 Essential (primary) hypertension: Secondary | ICD-10-CM

## 2017-09-22 LAB — BASIC METABOLIC PANEL
Anion gap: 6 (ref 5–15)
BUN: 17 mg/dL (ref 6–20)
CHLORIDE: 100 mmol/L (ref 98–111)
CO2: 40 mmol/L — ABNORMAL HIGH (ref 22–32)
Calcium: 9.5 mg/dL (ref 8.9–10.3)
Creatinine, Ser: 1.08 mg/dL (ref 0.61–1.24)
GFR calc Af Amer: >60 mL/min (ref >60)
GFR calc non Af Amer: >60 mL/min (ref >60)
GLUCOSE: 121 mg/dL — AB (ref 70–99)
Potassium: 4.2 mmol/L (ref 3.5–5.1)
Sodium: 146 mmol/L — ABNORMAL HIGH (ref 135–145)

## 2017-09-22 LAB — CBC
HEMATOCRIT: 45.7 % (ref 39.0–52.0)
HEMOGLOBIN: 13 g/dL (ref 13.0–17.0)
MCH: 28 pg (ref 26.0–34.0)
MCHC: 28.4 g/dL — AB (ref 30.0–36.0)
MCV: 98.5 fL (ref 78.0–100.0)
Platelets: 219 10*3/uL (ref 150–400)
RBC: 4.64 MIL/uL (ref 4.22–5.81)
RDW: 13.1 % (ref 11.5–15.5)
WBC: 6.2 10*3/uL (ref 4.0–10.5)

## 2017-09-22 LAB — MAGNESIUM: Magnesium: 2.3 mg/dL (ref 1.7–2.4)

## 2017-09-22 MED ORDER — SPIRONOLACTONE 25 MG PO TABS: 25.0000 mg | ORAL_TABLET | Freq: Every day | ORAL | 0 refills | Status: DC

## 2017-09-22 MED ORDER — GABAPENTIN 100 MG PO CAPS: 100.0000 mg | ORAL_CAPSULE | Freq: Two times a day (BID) | ORAL | 0 refills | Status: DC

## 2017-09-22 MED ORDER — CARVEDILOL 12.5 MG PO TABS: 12.5000 mg | ORAL_TABLET | Freq: Two times a day (BID) | ORAL | Status: DC

## 2017-09-22 MED ORDER — GABAPENTIN 100 MG PO CAPS
100.0000 mg | ORAL_CAPSULE | Freq: Two times a day (BID) | ORAL | Status: DC
  Administered 2017-09-22: 100 mg via ORAL
  Filled 2017-09-22: qty 1

## 2017-09-22 MED ORDER — CARVEDILOL 3.125 MG PO TABS
9.3750 mg | ORAL_TABLET | Freq: Two times a day (BID) | ORAL | Status: DC
  Administered 2017-09-22: 9.375 mg via ORAL
  Filled 2017-09-22: qty 1

## 2017-09-22 NOTE — Progress Notes (Signed)
Reviewed discharge information with patient.  Answered all questions. Pt able to teach back medications and reasons to contact MD/911. Patient verbalizes importance of PCP and heart failure clinic follow up appointment. Portable oxygen delivered to room. Barbee Shropshire. Brigitte Pulse, RN

## 2017-09-22 NOTE — Progress Notes (Addendum)
Progress Note  Patient Name: Keelyn Fjelstad Date of Encounter: 09/22/2017  Primary Cardiologist: Glori Bickers, MD  Subjective   Clinically feeling much better with less dyspnea, edema but doesn't really feel like he urinated as much overnight as he expected to.  Inpatient Medications    Scheduled Meds: . aspirin EC  81 mg Oral Daily  . carvedilol  9.375 mg Oral BID WC  . enoxaparin (LOVENOX) injection  40 mg Subcutaneous QHS  . furosemide  80 mg Oral Daily   And  . furosemide  40 mg Oral QPM  . hydrALAZINE  50 mg Oral TID  . isosorbide mononitrate  30 mg Oral Daily  . mouth rinse  15 mL Mouth Rinse BID  . potassium chloride SA  20 mEq Oral Daily  . sacubitril-valsartan  1 tablet Oral BID  . sodium chloride flush  3 mL Intravenous Q12H  . spironolactone  25 mg Oral Daily   Continuous Infusions: . sodium chloride     PRN Meds: sodium chloride, acetaminophen, ondansetron (ZOFRAN) IV, simethicone, sodium chloride flush   Vital Signs    Vitals:   09/21/17 2215 09/22/17 0216 09/22/17 0510 09/22/17 0513  BP:  120/72 124/90   Pulse:  72 76   Resp:   18   Temp:   98 F (36.7 C)   TempSrc:   Oral   SpO2: 94%  93%   Weight:   (P) 216 lb 7.9 oz (98.2 kg) 216 lb 7.9 oz (98.2 kg)  Height:        Intake/Output Summary (Last 24 hours) at 09/22/2017 0757 Last data filed at 09/22/2017 0600 Gross per 24 hour  Intake 1383 ml  Output 2000 ml  Net -617 ml   Filed Weights   09/21/17 1220 09/22/17 0510 09/22/17 0513  Weight: 217 lb 9.5 oz (98.7 kg) (P) 216 lb 7.9 oz (98.2 kg) 216 lb 7.9 oz (98.2 kg)    Telemetry    NSR with 27 beat run NSVT, QTC hand measured by tele 47ms with enlargement features - Personally Reviewed  Physical Exam   GEN: No acute distress. Morbidly obese  HEENT: Normocephalic, atraumatic, sclera non-icteric. Neck: No JVD or bruits. Cardiac: RRR no murmurs, rubs, or gallops.  Radials/DP/PT 1+ and equal bilaterally.  Respiratory: Clear to  auscultation bilaterally. Breathing is unlabored. GI: Soft, nontender, non-distended, BS +x 4. MS: short stature and curvature of LE c/w osteogenesis imperfecta Extremities: No clubbing or cyanosis. trace soft BLE edema. Distal pedal pulses are 2+ and equal bilaterally. Neuro:  AAOx3. Follows commands. Psych:  Responds to questions appropriately with a normal affect.  Labs    Chemistry Recent Labs  Lab 09/19/17 2108 09/20/17 0354 09/21/17 0430 09/22/17 0420  NA 147* 144 147* 146*  K 3.6 3.1* 4.2 4.2  CL 101 98 99 100  CO2 37* 38* 42* 40*  GLUCOSE 100* 136* 102* 121*  BUN 11 12 13 17   CREATININE 1.14 1.05 1.06 1.08  CALCIUM 8.9 8.9 9.0 9.5  PROT 7.3  --   --   --   ALBUMIN 3.1*  --   --   --   AST 21  --   --   --   ALT 18  --   --   --   ALKPHOS 51  --   --   --   BILITOT 0.9  --   --   --   GFRNONAA >60 >60 >60 >60  GFRAA >60 >60 >60 >60  ANIONGAP 9 8 6 6      Hematology Recent Labs  Lab 09/19/17 2108 09/21/17 0430 09/22/17 0420  WBC 4.6 5.8 6.2  RBC 4.67 4.56 4.64  HGB 13.2 13.0 13.0  HCT 45.8 44.9 45.7  MCV 98.1 98.5 98.5  MCH 28.3 28.5 28.0  MCHC 28.8* 29.0* 28.4*  RDW 13.1 13.2 13.1  PLT 212 190 219    Cardiac Enzymes Recent Labs  Lab 09/19/17 2108 09/20/17 1207  TROPONINI 0.03* 0.04*   No results for input(s): TROPIPOC in the last 168 hours.   BNP Recent Labs  Lab 09/19/17 2108 09/20/17 1207  BNP >4,500.0* 3,891.6*     DDimer No results for input(s): DDIMER in the last 168 hours.   Radiology    No results found.  Cardiac Studies   2d echo Study Conclusions  - Left ventricle: Wall thickness was increased in a pattern of severe LVH. Systolic function was severely reduced. The estimated ejection fraction was in the range of 25% to 30%. Severe hypokinesis of the mid-apicalanteroseptal myocardium. Features are consistent with a pseudonormal left ventricular filling pattern, with concomitant abnormal relaxation and  increased filling pressure (grade 2 diastolic dysfunction). - Left atrium: The atrium was severely dilated. - Right ventricle: The cavity size was mildly dilated. - Right atrium: The atrium was moderately to severely dilated.   Patient Profile     47 y.o. male wheelchair-boundmalewith a hx of NICM,chronic systolic heart failure, HTN, OSA, osteogenic imperfecta, pulm HTN (felt primarily venous pulm HTN on prior RHC, may be component of PAH 2/2 OSA), morbid obesity, prior noncompliancewho is being seen today for the evaluation ofCHFat the request of Dr. Alcario Drought.  Assessment & Plan    1.Acute on chronic systolic CHF/NICM -unclear precipitant except perhaps drinking more fluids because of heat outside. Clinically improving with diuresis.  EF has further reduced from prior and with RV dysfunction as well. Spironolactone initiated. Lytes OK. I did speak with [name] to review his drop in LV/RV who feels this is par for the course for this patient, and RV likely in setting of his obesity hypoventilation and sleep apnea. He agrees with medical therapy as we are doing. Pt states he did not urinate as much yesterday as he expected, I will discuss whether we should titrate Lasix to 80mg  BID with MD. Considered titrating BB but per d/w [name] not necessary at this time.  2.Elevated troponin-no recent CP noted. He did have abnormal nuc in 2018 but appears this was managed medically in context of normal LHC in 2015. Likely demand from CHF.  3. HTN - BP looks much better with addition of spironolactone.  4.OSA - brief nocturnal pause noted earlier in admission - will order home CPAP for his OSA as this is likely related.   5. ?Prolonged QT interval - previously difficult to discern where the T wave ended so diagnosis not clear. Now a little easier to see on tele using enlargement feature - QTc 432ms. K and Mg OK.   6. NSVT - previous rare 6-8 beat runs, but 27 beat run  overnight. K and Mg OK. Initially planned to titrate beta blocker but question whether we need to send home on higher dose of Lasix. Will need re-eval of LVEF as OP to discern candidacy for ICD.  6. Hypokalemia -resolved.  I called [facility] and scheduled f/u BMET Monday 09/27/17 at 11am (given spiro initiation), Susie said she will be entering the order. I requested TOC f/u within  2 weeks which was scheduled for 7/17 at 11am with NP/PA. Appts placed on AVS. Will review final recs for DC with Dr. Marlou Porch.    For questions or updates, please contact [place] Please consult [email or web address] for contact info under Cardiology/STEMI.  Signed, Charlie Pitter, PA-C 09/22/2017, 7:57 AM    Personally seen and examined. Agree with above.  Feels better. No SOB. Worried about coming back to hospital  Alert, Lungs clear, RRR, no edema, MSK deformity.   Labs: K 4.2 (on spironolactone)  Tele: stable  A/P Acute on chronic systolic HF  OK to DC home. Meds as above. New start spironolactone. He knows to call if fluid starts to accumulate Discussed daily weights (up 3 pounds, take extra 40 lasix)  Candee Furbish, MD

## 2017-09-22 NOTE — Telephone Encounter (Signed)
Sharrell Ku, PA called to schedule patient for lab appt next Monday and post hospital f/u.  Both appointments scheduled and bmet ordered.

## 2017-09-23 NOTE — Discharge Summary (Signed)
Physician Discharge Summary  Arthor Gorter XNT:[id number] DOB: [birth year] DOA: 09/19/2017  PCP: Jerry Edwards, No Pcp Per  Admit date: 09/19/2017 Discharge date: 09/22/2017  Admitted From: Home.  Disposition:  Home.   Recommendations for Outpatient Follow-up:  1. Follow up with PCP in 1-2 weeks 2. Please obtain BMP/CBC in one week Please follow up at heart failure clinic as recommended.   Discharge Condition:stable.  CODE STATUS: full code. Diet recommendation: Heart Healthy   Brief/Interim Summary: 47 year old male with a history of obstructive sleep apnea, osteogenic imperfecta, pulmonary hypertension, morbid obesity, medication noncompliance, history of nonischemic cardiomyopathy with congestive systolic heart failure with ejection fraction 40% came to the hospital with complains of increasing shortness of breath and lower extremity edema for past several days.  He was admitted to the hospital for acute on chronic systolic heart failure.  He has been getting diuresed during his hospital stay.  Echocardiogram shows slight worsening of his ejection fraction and grade 2 diastolic dysfunction.  Cardiology has been following and recommendations given on discharge.     Discharge Diagnoses:  Principal Problem:   Acute on chronic systolic heart failure (HCC) Active Problems:   Hypertensive heart disease   Osteogenesis imperfecta   Obstructive sleep apnea   Acute on chronic respiratory failure with hypoxia (HCC)   Morbid obesity (HCC)   Essential hypertension  Acute respiratory distress; improved Acute on chronic hypoxia, 3 L nasal cannula.  Uses 2 L nasal cannula at home Acute on chronic systolic congestive heart failure, ejection fraction 40%, class III Started with IV Lasix 80 mg  TID, changed to home regimen of lasix but aldactone added on discharge.  Daily weight, fluid restriction and strict input output -Repeat echocardiogram shows ejection fraction 20-25% with grade 2 diastolic  dysfunction which is lower than about a year ago.   -Continue Coreg, isosorbide mononitrate, Entresto  Elevated troponin - This is due to demand ischemia in the setting of fluid overload.  Remains chest pain-free.  Closely monitor.  Essential hypertension -She is on Coreg, Lasix, hydralazine, isosorbide mononitrate and Entresto  Obstructive sleep apnea -Apparently had ~3 second pause on telemetry overnight. - CPAP use to continue on discharge at home.   Osteogenesis imperfecta -Bedrest.  Supportive care  Numbness in the fingers since 3 weeks.  Neurontin started and recommended outpatient follow up with PCP.   Hypokalemia replaced.   Discharge Instructions  Discharge Instructions    Diet - low sodium heart healthy   Complete by:  As directed    Discharge instructions   Complete by:  As directed    PLEASE follow up with Heart failure clinic as recommended.     Allergies as of 09/22/2017      Reactions   Fish Allergy Anaphylaxis   Peanuts [peanut Oil] Anaphylaxis   Shellfish Allergy Anaphylaxis   Latex Itching      Medication List    TAKE these medications   aspirin EC 81 MG tablet Take 1 tablet (81 mg total) by mouth daily.   carvedilol 6.25 MG tablet Commonly known as:  COREG Take 1.5 tablets (9.375 mg total) by mouth 2 (two) times daily with a meal.   furosemide 80 MG tablet Commonly known as:  LASIX Take 1 tablet (80 mg total) by mouth every morning AND 0.5 tablets (40 mg total) every evening.   gabapentin 100 MG capsule Commonly known as:  NEURONTIN Take 1 capsule (100 mg total) by mouth 2 (two) times daily.   hydrALAZINE 25 MG tablet Commonly  known as:  APRESOLINE TAKE 2 TABLETS BY MOUTH THREE TIMES DAILY   isosorbide mononitrate 30 MG 24 hr tablet Commonly known as:  IMDUR TAKE 1 TABLET BY MOUTH ONCE DAILY   potassium chloride SA 20 MEQ tablet Commonly known as:  K-DUR,KLOR-CON TAKE 2 TABLETS BY MOUTH ONCE DAILY   sacubitril-valsartan 97-103  MG Commonly known as:  ENTRESTO Take 1 tablet by mouth 2 (two) times daily.   simethicone 80 MG chewable tablet Commonly known as:  MYLICON Chew 80 mg by mouth every 6 (six) hours as needed for flatulence.   spironolactone 25 MG tablet Commonly known as:  ALDACTONE Take 1 tablet (25 mg total) by mouth daily.      Follow-up Information    [facility] Follow up.   Specialty:  Cardiology Why:  - Come to the Heart Failure clinic on Monday 09/27/17 for labwork at [place] (BMET) - Follow-up appointment 10/06/17 at 11am with PA/nurse practitioner - arrive 15 minutes prior to appointment to check in - Parking garage code is [phone] Contact information: [address] [id number] [place] [address] Lewis, Shaune Pascal, MD. Schedule an appointment as soon as possible for a visit in 1 week(s).   Specialty:  Cardiology Contact information: [place] [address] [phone]          Allergies  Allergen Reactions  . Fish Allergy Anaphylaxis  . Peanuts [Peanut Oil] Anaphylaxis  . Shellfish Allergy Anaphylaxis  . Latex Itching    Consultations:  Cardiology.    Procedures/Studies: Dg Chest Port 1 View  Result Date: 09/19/2017 CLINICAL DATA:  Shortness of breath EXAM: PORTABLE CHEST 1 VIEW COMPARISON:  10/12/2016 FINDINGS: Cardiac shadow remains enlarged. Lungs are hypoaerated bilaterally. Mild central vascular crowding is noted. No focal infiltrate is seen. No acute bony abnormality is noted. IMPRESSION: Overall poor inspiratory effort with vascular crowding. No acute abnormality noted. Electronically Signed   By: Inez Catalina M.D.   On: 09/19/2017 21:27       Subjective: No chest pain or sob.   Discharge Exam: Vitals:   09/22/17 0510 09/22/17 1308  BP: 124/90 122/62  Pulse: 76 75  Resp: 18 16  Temp: 98 F (36.7 C) 98.5 F (36.9 C)  SpO2: 93% 95%    Vitals:   09/22/17 0216 09/22/17 0510 09/22/17 0513 09/22/17 1308  BP: 120/72 124/90  122/62  Pulse: 72 76  75  Resp:  18  16  Temp:  98 F (36.7 C)  98.5 F (36.9 C)  TempSrc:  Oral  Oral  SpO2:  93%  95%  Weight:  98.2 kg (216 lb 7.9 oz) 98.2 kg (216 lb 7.9 oz)   Height:        General: Pt is alert, awake, not in acute distress Cardiovascular: RRR, S1/S2 +, no rubs, no gallops Respiratory: CTA bilaterally, no wheezing, no rhonchi Abdominal: Soft, NT, ND, bowel sounds + Extremities: no edema, no cyanosis    The results of significant diagnostics from this hospitalization (including imaging, microbiology, ancillary and laboratory) are listed below for reference.     Microbiology: No results found for this or any previous visit (from the past 240 hour(s)).   Labs: BNP (last 3 results) Recent Labs    10/30/16 1221 09/19/17 2108 09/20/17 1207  BNP 280.0* >4,500.0* 8,185.6*   Basic Metabolic Panel: Recent Labs  Lab 09/19/17 2108 09/20/17 0354 09/20/17 1207  09/21/17 0430 09/22/17 0420  NA 147* 144  --  147* 146*  K 3.6 3.1*  --  4.2 4.2  CL 101 98  --  99 100  CO2 37* 38*  --  42* 40*  GLUCOSE 100* 136*  --  102* 121*  BUN 11 12  --  13 17  CREATININE 1.14 1.05  --  1.06 1.08  CALCIUM 8.9 8.9  --  9.0 9.5  MG  --   --  2.2 2.4 2.3   Liver Function Tests: Recent Labs  Lab 09/19/17 2108  AST 21  ALT 18  ALKPHOS 51  BILITOT 0.9  PROT 7.3  ALBUMIN 3.1*   No results for input(s): LIPASE, AMYLASE in the last 168 hours. No results for input(s): AMMONIA in the last 168 hours. CBC: Recent Labs  Lab 09/19/17 2108 09/21/17 0430 09/22/17 0420  WBC 4.6 5.8 6.2  NEUTROABS 3.1  --   --   HGB 13.2 13.0 13.0  HCT 45.8 44.9 45.7  MCV 98.1 98.5 98.5  PLT 212 190 219   Cardiac Enzymes: Recent Labs  Lab 09/19/17 2108 09/20/17 1207  TROPONINI 0.03* 0.04*   BNP: Invalid input(s): POCBNP CBG: No results for input(s): GLUCAP in the last 168  hours. D-Dimer No results for input(s): DDIMER in the last 72 hours. Hgb A1c No results for input(s): HGBA1C in the last 72 hours. Lipid Profile No results for input(s): CHOL, HDL, LDLCALC, TRIG, CHOLHDL, LDLDIRECT in the last 72 hours. Thyroid function studies Recent Labs    09/20/17 1207  TSH 0.574   Anemia work up No results for input(s): VITAMINB12, FOLATE, FERRITIN, TIBC, IRON, RETICCTPCT in the last 72 hours. Urinalysis    Component Value Date/Time   COLORURINE YELLOW 05/26/2011 2259   APPEARANCEUR CLEAR 05/26/2011 2259   LABSPEC 1.028 05/26/2011 2259   PHURINE 6.5 05/26/2011 2259   GLUCOSEU NEGATIVE 05/26/2011 2259   HGBUR NEGATIVE 05/26/2011 2259   BILIRUBINUR NEGATIVE 05/26/2011 2259   KETONESUR NEGATIVE 05/26/2011 2259   PROTEINUR 30 (A) 05/26/2011 2259   UROBILINOGEN 1.0 05/26/2011 2259   NITRITE NEGATIVE 05/26/2011 2259   LEUKOCYTESUR NEGATIVE 05/26/2011 2259   Sepsis Labs Invalid input(s): PROCALCITONIN,  WBC,  LACTICIDVEN Microbiology No results found for this or any previous visit (from the past 240 hour(s)).   Time coordinating discharge: 35  minutes  SIGNED:   Hosie Poisson, MD  [facility] 09/23/2017, 8:05 AM Pager   If 7PM-7AM, please contact night-coverage [email or web address] Password [email or web address]

## 2017-09-27 ENCOUNTER — Other Ambulatory Visit: Payer: Self-pay

## 2017-09-27 ENCOUNTER — Other Ambulatory Visit (HOSPITAL_COMMUNITY): Payer: Medicare Other

## 2017-09-27 NOTE — Patient Outreach (Signed)
[facility]) Care Management  09/27/2017  Jerry Edwards 1971/01/12 [id number]     EMMI-General Discharge RED ON EMMI ALERT Day # 1 Date: 09/24/17 Red Alert Reason: "Scheduled follow up appt? No"   Outreach attempt # 1 to patient. Spoke with patient who was just waking up. Reviewed and addressed red alert with patient. Patient voices that he is aware that he needs to follow up with cardiologist-Dr. Bensihmon. He plans to call office today to make appt. He voices that he only sees cardiologist as his issues are heart related. Patient voices that this mother will be taking him to appts. He denies any issues or concerns regarding his meds. He voices no further RN CM needs or concerns at this time. Advised patient that they would get one more automated EMMI-GENERAL post discharge calls to assess how they are doing following recent hospitalization and will receive a call from a nurse if any of their responses were abnormal. Patient voiced understanding and was appreciative of f/u call.       Plan: RN CM will close case at this time as no further interventions needed.    Enzo Montgomery, RN,BSN,CCM [facility] Telephonic Care Management Coordinator Direct Phone: [phone] Toll Free: [phone] Fax: [phone]

## 2017-10-05 NOTE — Progress Notes (Signed)
Patient ID: Jerry Edwards, male   DOB: [birth year], 47 y.o.   MRN: [id number]   [facility]     Primary Physician: Needs to establish.  Primary Cardiologist: [name] HF: [name]   HPI: Jerry Edwards is a 47 y.o. male with history of NICM, Chronic systolic heart failure, HTN, OSA, and osteogenic imperfecta.   Admitted to [facility] 1/17 through 04/21/13 with increased dyspnea and mild volume overload. Question regarding possible infiltrative disease. ECHO 04/08/2013. EF 25%. cMRI 12/15 EF 38% possible infiltrative disease with discrete areas of non-coronary pattern delayed enhancement. Coronaries were normal on LHC.  SPEP no M spike and UPEP no free light chains. HIV negative and TSH nl.CT of chest no evidence of pulmonary infiltrates. D/C weight 193 pounds.   He was admitted in January 2017 with recurrent HF, severe HTN and hypoxemia. Diuresed and discharged home. Saw Dr. Acie Fredrickson a few weeks and was not taking Bidl because he couldn't afford. So given scripts for hydralazine and Imdur and now can afford. Also started on home O2 for sats in 70s.   Admitted 02/21/16 with acute respiratory failure. Treated with IV lasix but slowed down with creatinine rise. Also had hypertensive emergency on arrival requiring IV NTG. Likely due to non-compliance, as he had stopped several of his medications.    Admitted 7/23 - 10/15/16 with mild chest discomfort lasting up to 30 minutes that improved with rest. It was worse after eating but not consistent, it would also occur at rest. Occasionally associated with diaphoresis and dyspnea. Echo and Stress test as below.   Had PYP scan on 11/06/16 with ratio of 1.2 (felt to be negative) - unable to have MRI (couldnt fit)  Admitted 7/79-05/29/01 with A/C systolic HF. Diuresed with IV lasix and transitioned back to lasix 80/40. Echo showed lower EF 25-30%. HF medications optimized. DC weight: 216 lbs  He presents today for post hospital follow up. Overall doing well.  Denies SOB, orthopnea, PND, or edema. He is wheelchair bound, but able to do ADLs with no SOB. No CP or dizziness. Energy and appetite are good. Says he is wearing 2 L O2 at all times, but is not wearing today. Used CPAP while inpatient and wants to try it again at home. He is limiting his fluid and salt intake. Taking all medications. Weights stable at home 217-219 lbs.  Review of systems complete and found to be negative unless listed in HPI.   Echo 09/20/17: EF 25-30%, grade 2 DD  10/13/16  LVEF 35-40%, Grade 1 DD, Mild MR, Mild LAE  Myoview 10/15/16  No change in EKG from baseline T wave inversions in the inferolateral leads  There is a medium defect of mild severity present in the basal inferior, mid inferior and apical inferior location. The defect is non-reversible and consistent with prior infarct.  There is a small defect of moderate severity present in the apex location. The defect is partially reversible and consistent with infarct with possible small area of peri infarct ischemia.  This is a high risk study.  EF not calculated but visually appears to be moderately to severely reduced.  Echo 1/17 30-35% RV ok  Echo 02/22/16 LVEF 40%, Grade 1 DD.    LHC/RHC 04/08/13  Normal coronaries RA pressure:  39/39 with a mean of 34. Prominent X. and Y. Descends.  RV pressure: 76/26  mean 4 PA pressure: 70/36 with a mean of 51  Pulmonary capillary wedge pressure: 31/30 and mean of 29  LV pressure:  140/26 with a left ventricular end-diastolic pressure of 35  PA sat 66%  AO sat 97%  CO/CI 4.94/2.63  Pulmonary vascular resistance: 4.45 Woods units   PMH: 1. HTN 2. Osteogenesis imperfecta: wheelchair-bound.  History of bone fractures.  3. Suspect OSA 4. CKD 5. Nonischemic cardiomyopathy: Echo (1/15) with EF 20-25%, severe diffuse hypokinesis, mild MR, mildly enlarged RV with moderately decreased systolic function.  LHC/RHC (1/15) with normal coronaries, mean RA 34, PA 70/36 mean 51,  mean PCWP 29, CI 2.63.  Cardiac MRI (1/15) with EF 38%, global hypokinesis, discrete areas of delayed enhancement in the subepicardial mid inferoseptal RV insertion site, the mid wall of the mid inferolateral wall, and the subendocardial mid anterolateral wall.  This pattern was suggestive of infiltrative disease.  UPEP/SPEP negative, HIV negative.  Chest CT to look for evidence of sarcoidosis did not show any sarcoid-type lesions.    SH:  Single, prior smoking now quit, occasional marijuana, works as Dispensing optician.   FH:  HTN, no significant cardiac disease.   Current Outpatient Medications  Medication Sig Dispense Refill  . aspirin 81 MG tablet Take 1 tablet (81 mg total) by mouth daily.    . carvedilol (COREG) 6.25 MG tablet Take 1.5 tablets (9.375 mg total) by mouth 2 (two) times daily with a meal. 90 tablet 5  . furosemide (LASIX) 80 MG tablet Take 1 tablet (80 mg total) by mouth every morning AND 0.5 tablets (40 mg total) every evening. 45 tablet 3  . gabapentin (NEURONTIN) 100 MG capsule Take 1 capsule (100 mg total) by mouth 2 (two) times daily. 30 capsule 0  . hydrALAZINE (APRESOLINE) 25 MG tablet TAKE 2 TABLETS BY MOUTH THREE TIMES DAILY 135 tablet 3  . isosorbide mononitrate (IMDUR) 30 MG 24 hr tablet TAKE 1 TABLET BY MOUTH ONCE DAILY 30 tablet 3  . potassium chloride SA (K-DUR,KLOR-CON) 20 MEQ tablet TAKE 2 TABLETS BY MOUTH ONCE DAILY 60 tablet 3  . sacubitril-valsartan (ENTRESTO) 97-103 MG Take 1 tablet by mouth 2 (two) times daily. 180 tablet 3  . simethicone (MYLICON) 80 MG chewable tablet Chew 80 mg by mouth every 6 (six) hours as needed for flatulence.    Marland Kitchen spironolactone (ALDACTONE) 25 MG tablet Take 1 tablet (25 mg total) by mouth daily. 30 tablet 0   No current facility-administered medications for this encounter.     PHYSICAL EXAM: Vitals:   10/06/17 1117  BP: (!) 132/96  Pulse: 84  SpO2: 90%  Weight: 219 lb 12.8 oz (99.7 kg)    Wt Readings from Last 3 Encounters:    10/06/17 219 lb 12.8 oz (99.7 kg)  09/22/17 216 lb 7.9 oz (98.2 kg)  05/12/17 227 lb 1.9 oz (103 kg)    General: Small stature. In wheelchair. No resp difficulty. HEENT: Normal Neck: Supple. JVP unable to assess due to body habitus. Carotids 2+ bilat; no bruits. No thyromegaly or nodule noted. Cor: PMI nondisplaced. RRR, No M/G/R noted Lungs: CTAB, normal effort. Abdomen: Soft, non-tender, non-distended, no HSM. No bruits or masses. +BS  Extremities: No cyanosis, clubbing, or rash. R and LLE no edema.  Neuro: Alert & orientedx3, cranial nerves grossly intact. moves all 4 extremities w/o difficulty. Affect pleasant   ASSESSMENT & PLAN: 1. Chronic Systolic Heart Failure. NICM EF 25% with no coronary disease on LHC in 1/15. 03/2013 cMRI possible infiltrative disease. SPEP and UPEP ok.  - EF 10/13/2016 35-40%. Myoview high risk due to LV dysfunction with mild inferior scar  - Based  on the cMRI pattern 12/15 and concern for cardiac sarcoidosis, a chest CT was done to look for evidence of pulmonary sarcoidosis.  This was not indicative of pulmonary sarcoidosis.  Patient has had no significant arrhythmias detected.  QRS on ECG is not widened.   - Pt unable to fit in cMRI machine due to body stature.  PYP scan 8/18 reviewed and felt to be negative.  - Echo 09/2017: EF 25-30%, grade 2 DD - NYHA class II symptoms. Volume status stable on exam.  - Continue lasix 80 mg am, 40 mg pm.  - Increase coreg to 12.5 mg BID - Continue Entresto 97/103 mg BID.  - Continue hydralazine 50 mg TID and imdur 30 mg daily.   - Continue spiro 25 mg daily  - Reinforced fluid restriction to < 2 L daily, sodium restriction to less than 2000 mg daily, and the importance of daily weights.   - Repeat echo 3-4 months with Dr Haroldine Laws. Discussed possibility of ICD if EF remains low.  2. Osteogenesis imperfecta  - Wheel chair bound; limited mobility. No change.  3. HTN. - Manage with HF medications. SBP 120-130s at home.   4. OSA  - Wears O2 at night. Does not have CPAP anymore.  - Willing to try CPAP again. Refer for repeat sleep study (last was 5 years ago) 5. Morbid Obesity - Body mass index is 49.28 kg/m. - Encouraged need to lose weight 6. Pulmonary hypertension- This appears to be primarily pulmonary venous hypertension on previous RHC.  There may be a component of PAH related to OSA.   - No change.  7. Chronic respiratory failure - Continue O2. Encouraged him to wear at all times.  - Encouraged weight loss.   BMET today  Increase coreg to 12.5 mg BID Refer for repeat sleep study Follow up 4 weeks.  Repeat echo in 3-4 months with Dr Haroldine Laws   Georgiana Shore, NP  10/06/2017 11:21 AM  Greater than 50% of the 25 minute visit was spent in counseling/coordination of care regarding disease state education, salt/fluid restriction, sliding scale diuretics, and medication compliance.

## 2017-10-06 ENCOUNTER — Ambulatory Visit (HOSPITAL_COMMUNITY)
Admit: 2017-10-06 | Discharge: 2017-10-06 | Disposition: A | Discharge status: Home / Self Care | Payer: Medicare Other | Source: Ambulatory Visit | Attending: Cardiology | Admitting: Cardiology

## 2017-10-06 ENCOUNTER — Encounter (HOSPITAL_COMMUNITY): Payer: Self-pay

## 2017-10-06 VITALS — BP 132/96 | HR 84 | Wt 219.8 lb

## 2017-10-06 DIAGNOSIS — Z87891 Personal history of nicotine dependence: Secondary | ICD-10-CM | POA: Insufficient documentation

## 2017-10-06 DIAGNOSIS — I5022 Chronic systolic (congestive) heart failure: Secondary | ICD-10-CM | POA: Diagnosis not present

## 2017-10-06 DIAGNOSIS — I272 Pulmonary hypertension, unspecified: Secondary | ICD-10-CM | POA: Diagnosis not present

## 2017-10-06 DIAGNOSIS — Z79899 Other long term (current) drug therapy: Secondary | ICD-10-CM | POA: Diagnosis not present

## 2017-10-06 DIAGNOSIS — I428 Other cardiomyopathies: Secondary | ICD-10-CM | POA: Insufficient documentation

## 2017-10-06 DIAGNOSIS — G4733 Obstructive sleep apnea (adult) (pediatric): Secondary | ICD-10-CM | POA: Diagnosis not present

## 2017-10-06 DIAGNOSIS — Z993 Dependence on wheelchair: Secondary | ICD-10-CM | POA: Insufficient documentation

## 2017-10-06 DIAGNOSIS — I130 Hypertensive heart and chronic kidney disease with heart failure and stage 1 through stage 4 chronic kidney disease, or unspecified chronic kidney disease: Secondary | ICD-10-CM | POA: Diagnosis not present

## 2017-10-06 DIAGNOSIS — I10 Essential (primary) hypertension: Secondary | ICD-10-CM | POA: Diagnosis not present

## 2017-10-06 DIAGNOSIS — Z9981 Dependence on supplemental oxygen: Secondary | ICD-10-CM | POA: Diagnosis not present

## 2017-10-06 DIAGNOSIS — Z7982 Long term (current) use of aspirin: Secondary | ICD-10-CM | POA: Diagnosis not present

## 2017-10-06 DIAGNOSIS — J9610 Chronic respiratory failure, unspecified whether with hypoxia or hypercapnia: Secondary | ICD-10-CM | POA: Diagnosis not present

## 2017-10-06 DIAGNOSIS — N189 Chronic kidney disease, unspecified: Secondary | ICD-10-CM | POA: Insufficient documentation

## 2017-10-06 DIAGNOSIS — Q780 Osteogenesis imperfecta: Secondary | ICD-10-CM | POA: Insufficient documentation

## 2017-10-06 DIAGNOSIS — Z6841 Body Mass Index (BMI) 40.0 and over, adult: Secondary | ICD-10-CM | POA: Insufficient documentation

## 2017-10-06 DIAGNOSIS — J9611 Chronic respiratory failure with hypoxia: Secondary | ICD-10-CM

## 2017-10-06 LAB — BASIC METABOLIC PANEL
Anion gap: 6 (ref 5–15)
BUN: 9 mg/dL (ref 6–20)
CHLORIDE: 100 mmol/L (ref 98–111)
CO2: 38 mmol/L — ABNORMAL HIGH (ref 22–32)
CREATININE: 0.99 mg/dL (ref 0.61–1.24)
Calcium: 8.9 mg/dL (ref 8.9–10.3)
GFR calc Af Amer: >60 mL/min (ref >60)
GFR calc non Af Amer: >60 mL/min (ref >60)
GLUCOSE: 109 mg/dL — AB (ref 70–99)
Potassium: 3.8 mmol/L (ref 3.5–5.1)
SODIUM: 144 mmol/L (ref 135–145)

## 2017-10-06 MED ORDER — CARVEDILOL 12.5 MG PO TABS: 12.5000 mg | ORAL_TABLET | Freq: Two times a day (BID) | ORAL | 11 refills | Status: DC

## 2017-10-06 MED ORDER — POTASSIUM CHLORIDE CRYS ER 20 MEQ PO TBCR: 40.0000 meq | EXTENDED_RELEASE_TABLET | Freq: Every day | ORAL | 3 refills | Status: DC

## 2017-10-06 NOTE — Patient Instructions (Signed)
Routine lab work today. Will notify you of abnormal results, otherwise no news is good news!  INCREASE Carvedilol (Coreg) to 12.5 mg twice daily. Can "double up" on current 6.25 mg tablets (Take 2 tabs twice daily). New Rx has been sent to your pharmacy for 12.5 mg tablets (Take 1 tab twice daily).  Will schedule you for sleep study at [facility]. Address: [address], [place], [place] [address] Phone: [phone] Their office will call you to schedule.  Follow up 4 weeks.  ____________________________________________________________________ Jerry Edwards Code: 1500  Take all medication as prescribed the day of your appointment. Bring all medications with you to your appointment.  Do the following things EVERYDAY: 1) Weigh yourself in the morning before breakfast. Write it down and keep it in a log. 2) Take your medicines as prescribed 3) Eat low salt foods-Limit salt (sodium) to 2000 mg per day.  4) Stay as active as you can everyday 5) Limit all fluids for the day to less than 2 liters

## 2017-10-08 ENCOUNTER — Other Ambulatory Visit (HOSPITAL_COMMUNITY): Payer: Self-pay | Admitting: Cardiology

## 2017-10-08 MED ORDER — POTASSIUM CHLORIDE CRYS ER 20 MEQ PO TBCR: 40.0000 meq | EXTENDED_RELEASE_TABLET | Freq: Every day | ORAL | 3 refills | Status: DC

## 2017-10-22 ENCOUNTER — Other Ambulatory Visit (HOSPITAL_COMMUNITY): Payer: Self-pay | Admitting: Internal Medicine

## 2017-10-26 ENCOUNTER — Other Ambulatory Visit (HOSPITAL_COMMUNITY): Payer: Self-pay | Admitting: Internal Medicine

## 2017-11-02 NOTE — Progress Notes (Signed)
Patient ID: Jerry Edwards, male   DOB: [birth year], 47 y.o.   MRN: [id number]   [facility]     Primary Physician: Patient, No Pcp Per Primary Cardiologist: [name] HF: [name]   HPI: Jerry Edwards is a 47 y.o. male with history of NICM, Chronic systolic heart failure, HTN, OSA, and osteogenic imperfecta.   Admitted to [facility] 1/17 through 04/21/13 with increased dyspnea and mild volume overload. Question regarding possible infiltrative disease. ECHO 04/08/2013. EF 25%. cMRI 12/15 EF 38% possible infiltrative disease with discrete areas of non-coronary pattern delayed enhancement. Coronaries were normal on LHC.  SPEP no M spike and UPEP no free light chains. HIV negative and TSH nl.CT of chest no evidence of pulmonary infiltrates. D/C weight 193 pounds.   He was admitted in January 2017 with recurrent HF, severe HTN and hypoxemia. Diuresed and discharged home. Saw Dr. Acie Fredrickson a few weeks and was not taking Bidl because he couldn't afford. So given scripts for hydralazine and Imdur and now can afford. Also started on home O2 for sats in 70s.   Admitted 02/21/16 with acute respiratory failure. Treated with IV lasix but slowed down with creatinine rise. Also had hypertensive emergency on arrival requiring IV NTG. Likely due to non-compliance, as he had stopped several of his medications.    Admitted 7/23 - 10/15/16 with mild chest discomfort lasting up to 30 minutes that improved with rest. It was worse after eating but not consistent, it would also occur at rest. Occasionally associated with diaphoresis and dyspnea. Echo and Stress test as below.   Had PYP scan on 11/06/16 with ratio of 1.2 (felt to be negative) - unable to have MRI (couldnt fit)  Admitted 5/46-04/29/01 with A/C systolic HF. Diuresed with IV lasix and transitioned back to lasix 80/40. Echo showed lower EF 25-30%. HF medications optimized. DC weight: 216 lbs  He presents today for HF follow up. Last visit coreg was increased.  Overall doing fine. 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 Denies SOB with transfers. No orthopnea, PND, or edema. No CP or dizziness. He has a decreased appetite over the last 3-4 months, but is still eating a lot per wife. He is unable to carry his oxygen to appointments because he has a rolling O2 tank and is also WC bound. He has a repeat sleep study on Sunday. Hopeful to get back on CPAP. He is limiting salt intake. Limits fluid intake to less than 1.5 L. Taking all medications.   Review of systems complete and found to be negative unless listed in HPI.   Echo 09/20/17: EF 25-30%, grade 2 DD  10/13/16  LVEF 35-40%, Grade 1 DD, Mild MR, Mild LAE  Myoview 10/15/16  No change in EKG from baseline T wave inversions in the inferolateral leads  There is a medium defect of mild severity present in the basal inferior, mid inferior and apical inferior location. The defect is non-reversible and consistent with prior infarct.  There is a small defect of moderate severity present in the apex location. The defect is partially reversible and consistent with infarct with possible small area of peri infarct ischemia.  This is a high risk study.  EF not calculated but visually appears to be moderately to severely reduced.  Echo 1/17 30-35% RV ok  Echo 02/22/16 LVEF 40%, Grade 1 DD.    LHC/RHC 04/08/13  Normal  coronaries RA pressure:  39/39 with a mean of 34. Prominent X. and Y. Descends.  RV pressure: 76/26  mean 4 PA pressure: 70/36 with a mean of 51  Pulmonary capillary wedge pressure: 31/30 and mean of 29  LV pressure: 140/26 with a left ventricular end-diastolic pressure of 35  PA sat 66%  AO sat 97%  CO/CI 4.94/2.63  Pulmonary vascular resistance: 4.45 Woods units   PMH: 1. HTN 2. Osteogenesis imperfecta: wheelchair-bound.  History of bone fractures.  3. Suspect OSA 4. CKD 5. Nonischemic cardiomyopathy:  Echo (1/15) with EF 20-25%, severe diffuse hypokinesis, mild MR, mildly enlarged RV with moderately decreased systolic function.  LHC/RHC (1/15) with normal coronaries, mean RA 34, PA 70/36 mean 51, mean PCWP 29, CI 2.63.  Cardiac MRI (1/15) with EF 38%, global hypokinesis, discrete areas of delayed enhancement in the subepicardial mid inferoseptal RV insertion site, the mid wall of the mid inferolateral wall, and the subendocardial mid anterolateral wall.  This pattern was suggestive of infiltrative disease.  UPEP/SPEP negative, HIV negative.  Chest CT to look for evidence of sarcoidosis did not show any sarcoid-type lesions.    SH:  Single, prior smoking now quit, occasional marijuana, works as Dispensing optician.   FH:  HTN, no significant cardiac disease.   Current Outpatient Medications  Medication Sig Dispense Refill  . aspirin 81 MG tablet Take 1 tablet (81 mg total) by mouth daily.    . carvedilol (COREG) 12.5 MG tablet Take 1 tablet (12.5 mg total) by mouth 2 (two) times daily with a meal. 60 tablet 11  . furosemide (LASIX) 80 MG tablet Take 1 tablet (80 mg total) by mouth every morning AND 0.5 tablets (40 mg total) every evening. 45 tablet 3  . isosorbide mononitrate (IMDUR) 30 MG 24 hr tablet TAKE 1 TABLET BY MOUTH ONCE DAILY 30 tablet 3  . potassium chloride SA (K-DUR,KLOR-CON) 20 MEQ tablet Take 2 tablets (40 mEq total) by mouth daily. 180 tablet 3  . sacubitril-valsartan (ENTRESTO) 97-103 MG Take 1 tablet by mouth 2 (two) times daily. 180 tablet 3  . simethicone (MYLICON) 80 MG chewable tablet Chew 80 mg by mouth every 6 (six) hours as needed for flatulence.    Marland Kitchen spironolactone (ALDACTONE) 25 MG tablet Take 1 tablet (25 mg total) by mouth daily. 30 tablet 0  . gabapentin (NEURONTIN) 100 MG capsule Take 1 capsule (100 mg total) by mouth 2 (two) times daily. (Patient not taking: Reported on 11/03/2017) 30 capsule 0  . hydrALAZINE (APRESOLINE) 25 MG tablet TAKE 2 TABLETS BY MOUTH THREE TIMES DAILY 135  tablet 3   No current facility-administered medications for this encounter.     PHYSICAL EXAM: Vitals:   11/03/17 1127  BP: (!) 146/96  Pulse: 67  SpO2: 90%  Weight: 102 kg (224 lb 12.8 oz)    Wt Readings from Last 3 Encounters:  11/03/17 102 kg (224 lb 12.8 oz)  10/06/17 99.7 kg (219 lb 12.8 oz)  09/22/17 98.2 kg (216 lb 7.9 oz)    General: Small stature. In wheelchair.  No resp difficulty. HEENT: Normal Neck: Supple. JVP 5-6. Carotids 2+ bilat; no bruits. No thyromegaly or nodule noted. Cor: PMI nondisplaced. RRR, No M/G/R noted Lungs: diminished, clear throughout Abdomen: obese, soft, non-tender, non-distended, no HSM. No bruits or masses. +BS  Extremities: No cyanosis, clubbing, or rash. R and LLE no edema.  Neuro: Alert & orientedx3, cranial nerves grossly intact. moves all 4 extremities w/o difficulty. Affect pleasant  ASSESSMENT &  PLAN: 1. Chronic Systolic Heart Failure. NICM EF 25% with no coronary disease on LHC in 1/15. 03/2013 cMRI possible infiltrative disease. SPEP and UPEP ok.  - EF 10/13/2016 35-40%. Myoview high risk due to LV dysfunction with mild inferior scar  - Based on the cMRI pattern 12/15 and concern for cardiac sarcoidosis, a chest CT was done to look for evidence of pulmonary sarcoidosis.  This was not indicative of pulmonary sarcoidosis.  Patient has had no significant arrhythmias detected.  QRS on ECG is not widened.   - Pt unable to fit in cMRI machine due to body stature.  PYP scan 8/18 reviewed and felt to be negative.  - Echo 09/2017: EF 25-30%, grade 2 DD - NYHA class II symptoms. Volume status stable on exam despite weight gain - Continue lasix 80 mg am, 40 mg pm. Stable labs with creatinine 0.99, K 3.8 on 7/17 - Continue coreg 12.5 mg BID - Continue Entresto 97/103 mg BID.  - Increase hydralazine to 75 mg TID. Continue imdur 30 mg daily.   - Continue spiro 25 mg daily  - Reinforced fluid restriction to < 2 L daily, sodium restriction to less  than 2000 mg daily, and the importance of daily weights.   - Repeat echo in October with Dr Haroldine Laws. Discussed possibility of ICD if EF remains low.  2. Osteogenesis imperfecta  - Wheel chair bound; limited mobility. No change.   3. HTN. - Elevated today. Increase hydralazine as above.  4. OSA  - Wears O2 at night. Does not have CPAP anymore.  - Willing to try CPAP again. Has sleep study on Sunday 5. Morbid Obesity - Body mass index is 50.4 kg/m. - Encouraged need to lose weight. No change.  6. Pulmonary hypertension- This appears to be primarily pulmonary venous hypertension on previous RHC.  There may be a component of PAH related to OSA.   - No change.  7. Chronic respiratory failure - Continue O2. Encouraged him to wear at all times.  - Encouraged weight loss.  - We will consult [facility] to see if there are other options for him to carry his oxygen - ?attachment to wheelchair  Increase hydralazine to 75 mg TID Contact [facility] for ?O2 tank carrier for wheelchair. Echo with Dr Haroldine Laws in 8 weeks  Georgiana Shore, NP  11/03/2017 11:30 AM  Greater than 50% of the 25 minute visit was spent in counseling/coordination of care regarding disease state education, salt/fluid restriction, sliding scale diuretics, and medication compliance.

## 2017-11-03 ENCOUNTER — Ambulatory Visit (HOSPITAL_COMMUNITY)
Admission: RE | Admit: 2017-11-03 | Discharge: 2017-11-03 | Disposition: A | Discharge status: Home / Self Care | Payer: Medicare Other | Source: Ambulatory Visit | Attending: Cardiology | Admitting: Cardiology

## 2017-11-03 ENCOUNTER — Encounter (HOSPITAL_COMMUNITY): Payer: Self-pay

## 2017-11-03 VITALS — BP 146/96 | HR 67 | Wt 224.8 lb

## 2017-11-03 DIAGNOSIS — Z7982 Long term (current) use of aspirin: Secondary | ICD-10-CM | POA: Insufficient documentation

## 2017-11-03 DIAGNOSIS — Q780 Osteogenesis imperfecta: Secondary | ICD-10-CM | POA: Diagnosis not present

## 2017-11-03 DIAGNOSIS — I272 Pulmonary hypertension, unspecified: Secondary | ICD-10-CM | POA: Diagnosis not present

## 2017-11-03 DIAGNOSIS — I10 Essential (primary) hypertension: Secondary | ICD-10-CM

## 2017-11-03 DIAGNOSIS — I5022 Chronic systolic (congestive) heart failure: Secondary | ICD-10-CM | POA: Diagnosis not present

## 2017-11-03 DIAGNOSIS — Z87891 Personal history of nicotine dependence: Secondary | ICD-10-CM | POA: Insufficient documentation

## 2017-11-03 DIAGNOSIS — I429 Cardiomyopathy, unspecified: Secondary | ICD-10-CM | POA: Diagnosis not present

## 2017-11-03 DIAGNOSIS — G4733 Obstructive sleep apnea (adult) (pediatric): Secondary | ICD-10-CM | POA: Diagnosis not present

## 2017-11-03 DIAGNOSIS — J9611 Chronic respiratory failure with hypoxia: Secondary | ICD-10-CM

## 2017-11-03 DIAGNOSIS — Z6841 Body Mass Index (BMI) 40.0 and over, adult: Secondary | ICD-10-CM | POA: Insufficient documentation

## 2017-11-03 DIAGNOSIS — N189 Chronic kidney disease, unspecified: Secondary | ICD-10-CM | POA: Insufficient documentation

## 2017-11-03 DIAGNOSIS — I130 Hypertensive heart and chronic kidney disease with heart failure and stage 1 through stage 4 chronic kidney disease, or unspecified chronic kidney disease: Secondary | ICD-10-CM | POA: Diagnosis not present

## 2017-11-03 DIAGNOSIS — J9610 Chronic respiratory failure, unspecified whether with hypoxia or hypercapnia: Secondary | ICD-10-CM | POA: Insufficient documentation

## 2017-11-03 DIAGNOSIS — Z8249 Family history of ischemic heart disease and other diseases of the circulatory system: Secondary | ICD-10-CM | POA: Insufficient documentation

## 2017-11-03 DIAGNOSIS — Z79899 Other long term (current) drug therapy: Secondary | ICD-10-CM | POA: Diagnosis not present

## 2017-11-03 DIAGNOSIS — Z993 Dependence on wheelchair: Secondary | ICD-10-CM | POA: Insufficient documentation

## 2017-11-03 MED ORDER — HYDRALAZINE HCL 25 MG PO TABS: 75.0000 mg | ORAL_TABLET | Freq: Three times a day (TID) | ORAL | 11 refills | Status: DC

## 2017-11-03 NOTE — Patient Instructions (Signed)
INCREASE Hydralazine to 75 mg (3 tabs) three times daily.  Will have [place] come to home to do oxygen equipment consult. Their office will call you to schedule home visit.  Follow up in October with [name] and echocardiogram.  ___________________________________________________________________________ Jerry Edwards Code: 3825  Take all medication as prescribed the day of your appointment. Bring all medications with you to your appointment.  Do the following things EVERYDAY: 1) Weigh yourself in the morning before breakfast. Write it down and keep it in a log. 2) Take your medicines as prescribed 3) Eat low salt foods-Limit salt (sodium) to 2000 mg per day.  4) Stay as active as you can everyday 5) Limit all fluids for the day to less than 2 liters

## 2017-11-05 ENCOUNTER — Other Ambulatory Visit (HOSPITAL_COMMUNITY): Payer: Self-pay | Admitting: Internal Medicine

## 2017-11-07 ENCOUNTER — Ambulatory Visit (HOSPITAL_BASED_OUTPATIENT_CLINIC_OR_DEPARTMENT_OTHER): Payer: Medicare Other | Attending: Cardiology | Admitting: Internal Medicine

## 2017-11-07 VITALS — Ht <= 58 in | Wt 225.0 lb

## 2017-11-07 DIAGNOSIS — G4733 Obstructive sleep apnea (adult) (pediatric): Secondary | ICD-10-CM

## 2017-11-07 DIAGNOSIS — G4736 Sleep related hypoventilation in conditions classified elsewhere: Secondary | ICD-10-CM | POA: Insufficient documentation

## 2017-11-07 DIAGNOSIS — Z9989 Dependence on other enabling machines and devices: Secondary | ICD-10-CM

## 2017-11-07 DIAGNOSIS — R0683 Snoring: Secondary | ICD-10-CM | POA: Insufficient documentation

## 2017-11-07 DIAGNOSIS — I5022 Chronic systolic (congestive) heart failure: Secondary | ICD-10-CM

## 2017-11-07 DIAGNOSIS — I493 Ventricular premature depolarization: Secondary | ICD-10-CM | POA: Insufficient documentation

## 2017-11-09 ENCOUNTER — Other Ambulatory Visit (HOSPITAL_COMMUNITY): Payer: Self-pay

## 2017-11-09 ENCOUNTER — Other Ambulatory Visit (HOSPITAL_COMMUNITY): Payer: Self-pay | Admitting: *Deleted

## 2017-11-17 ENCOUNTER — Encounter (HOSPITAL_COMMUNITY): Payer: Self-pay | Admitting: Pharmacist

## 2017-11-22 DIAGNOSIS — G4733 Obstructive sleep apnea (adult) (pediatric): Secondary | ICD-10-CM

## 2017-11-22 DIAGNOSIS — I5022 Chronic systolic (congestive) heart failure: Secondary | ICD-10-CM | POA: Diagnosis not present

## 2017-11-22 NOTE — Procedures (Signed)
Patient Name: Jerry Edwards, Gee Date: 11/07/2017 Gender: Male D.O.B: [birth year] Age (years): 47 Referring Provider: Lillia Mountain NP Height (inches): 39 Interpreting Physician: Baird Lyons MD, ABSM Weight (lbs): 219 RPSGT: Lanae Boast BMI: 47 MRN: [id number] Neck Size: 20.00  CLINICAL INFORMATION Sleep Study Type: Split Night CPAP  Indication for sleep study: OSA  Epworth Sleepiness Score: 6  SLEEP STUDY TECHNIQUE As per the AASM Manual for the Scoring of Sleep and Associated Events v2.3 (April 2016) with a hypopnea requiring 4% desaturations.  The channels recorded and monitored were frontal, central and occipital EEG, electrooculogram (EOG), submentalis EMG (chin), nasal and oral airflow, thoracic and abdominal wall motion, anterior tibialis EMG, snore microphone, electrocardiogram, and pulse oximetry. Continuous positive airway pressure (CPAP) was initiated when the patient met split night criteria and was titrated according to treat sleep-disordered breathing.  MEDICATIONS Medications self-administered by patient taken the night of the study : none reported  RESPIRATORY PARAMETERS Diagnostic  Total AHI (/hr): 87.6 RDI (/hr): 91.5 OA Index (/hr): 44 CA Index (/hr): 0.0 REM AHI (/hr): 80.0 NREM AHI (/hr): 88.2 Supine AHI (/hr): 87.6 Non-supine AHI (/hr): 0 Min O2 Sat (%): 69.0 Mean O2 (%): 91.6 Time below 88% (min): 18.8   Titration  Optimal Pressure (cm): BIPAP 23/17 AHI at Optimal Pressure (/hr): 0.0 Min O2 at Optimal Pressure (%): 88.0 Supine % at Optimal (%): N/A Sleep % at Optimal (%): N/A   SLEEP ARCHITECTURE The recording time for the entire night was 403.3 minutes.  During a baseline period of 167.7 minutes, the patient slept for 139.0 minutes in REM and nonREM, yielding a sleep efficiency of 82.9%%. Sleep onset after lights out was 13.3 minutes with a REM latency of 91.5 minutes. The patient spent 10.1%% of the night in stage N1 sleep, 82.4%% in  stage N2 sleep, 0.0%% in stage N3 and 7.6% in REM.  During the titration period of 231.4 minutes, the patient slept for 199.0 minutes in REM and nonREM, yielding a sleep efficiency of 86.0%%. Sleep onset after CPAP initiation was 13.4 minutes with a REM latency of 35.5 minutes. The patient spent 5.8%% of the night in stage N1 sleep, 50.0%% in stage N2 sleep, 0.0%% in stage N3 and 44.2% in REM.  CARDIAC DATA The 2 lead EKG demonstrated sinus rhythm. The mean heart rate was 100.0 beats per minute. Other EKG findings include: PVCs.  LEG MOVEMENT DATA The total Periodic Limb Movements of Sleep (PLMS) were 0. The PLMS index was 0.0 .  IMPRESSIONS - Severe obstructive sleep apnea occurred during the diagnostic portion of the study (AHI = 87.6/hour).  - CPAP provided inadequate control and was convertedd to BIPAP with final pressure 23/17. - No significant central sleep apnea occurred during the diagnostic portion of the study (CAI = 0.0/hour). - Severe oxygen desaturation was noted during the diagnostic portion of the study (Min O2 = 69.0%). Supplemental O2 was titrated to 4L per protocol, with Mean saturation only 89.9% at BIPAP 23/17. - The patient snored with moderate snoring volume during the diagnostic portion of the study. - EKG findings include PVCs. - Clinically significant periodic limb movements did not occur during sleep.  DIAGNOSIS - Obstructive Sleep Apnea (327.23 [G47.33 ICD-10]) - Nocturnal Hypoxemia (327.26 [G47.36 ICD-10])  RECOMMENDATIONS - Recommend BiPAP 23/17 with supplemental O2 at 4L during sleep. Patient used a medium RESMED AirFit full face mask with heated humidifier. - Avoid alcohol, sedatives and other CNS depressants that may worsen sleep apnea and disrupt normal sleep  architecture. - Sleep hygiene should be reviewed to assess factors that may improve sleep quality. - Weight management and regular exercise should be initiated or continued.  [Electronically signed]  11/22/2017 07:34 PM  Baird Lyons MD, [place], American Board of Sleep Medicine   NPI: [id number]                          [place], [place] [facility]  ELECTRONICALLY SIGNED ON:  11/22/2017, 7:28 PM [place] PH: [phone]   FX: [phone] [place]

## 2017-11-23 ENCOUNTER — Other Ambulatory Visit (HOSPITAL_COMMUNITY): Payer: Self-pay | Admitting: Pharmacist

## 2017-11-23 MED ORDER — SACUBITRIL-VALSARTAN 97-103 MG PO TABS: 1.0000 | ORAL_TABLET | Freq: Two times a day (BID) | ORAL | 3 refills | Status: DC

## 2017-11-29 ENCOUNTER — Other Ambulatory Visit (HOSPITAL_COMMUNITY): Payer: Self-pay | Admitting: Pharmacist

## 2017-11-29 ENCOUNTER — Telehealth (HOSPITAL_COMMUNITY): Payer: Self-pay | Admitting: Pharmacist

## 2017-11-29 MED ORDER — SACUBITRIL-VALSARTAN 97-103 MG PO TABS: 1.0000 | ORAL_TABLET | Freq: Two times a day (BID) | ORAL | 3 refills | Status: DC

## 2017-11-29 NOTE — Telephone Encounter (Signed)
[employer] patient assistance temporarily approved for Entresto 97-103 mg BID x 90 days. Patient needs to show proof of Medicaid denial. Letter of denial with instructions mailed to patient's home.   Ruta Hinds. Velva Harman, PharmD, BCPS, CPP Clinical Pharmacist Phone: [phone] 11/29/2017 2:12 PM

## 2017-12-31 ENCOUNTER — Telehealth (HOSPITAL_COMMUNITY): Payer: Self-pay | Admitting: Cardiology

## 2017-12-31 NOTE — Telephone Encounter (Signed)
Pt aware and voiced understanding 

## 2017-12-31 NOTE — Telephone Encounter (Signed)
-----   Message from Adora Fridge, [place] sent at 12/31/2017  1:04 PM EDT ----- So he was temporarily approved through [employer] for a 90 day supply so have him call them at [phone] to get them to ship it to him. They will fully approve him if he applies for and is denied from Medicaid.   Thanks!   ----- Message ----- From: Kerry Dory, CMA Sent: 12/31/2017  12:05 PM EDT To: Adora Fridge, RPH-CPP  Patient called to request samples, is the PAN foundation still available. Patient reports his PAP application with [employer] is still pending

## 2018-01-17 ENCOUNTER — Encounter (HOSPITAL_COMMUNITY): Payer: Self-pay | Admitting: Internal Medicine

## 2018-01-17 ENCOUNTER — Ambulatory Visit (HOSPITAL_COMMUNITY)
Admission: RE | Admit: 2018-01-17 | Discharge: 2018-01-17 | Disposition: A | Discharge status: Home / Self Care | Payer: Medicare Other | Source: Ambulatory Visit | Attending: Internal Medicine | Admitting: Internal Medicine

## 2018-01-17 ENCOUNTER — Ambulatory Visit (HOSPITAL_BASED_OUTPATIENT_CLINIC_OR_DEPARTMENT_OTHER)
Admission: RE | Admit: 2018-01-17 | Discharge: 2018-01-17 | Disposition: A | Discharge status: Home / Self Care | Payer: Medicare Other | Source: Ambulatory Visit | Attending: Internal Medicine | Admitting: Internal Medicine

## 2018-01-17 VITALS — BP 142/80 | HR 79 | Wt 228.0 lb

## 2018-01-17 DIAGNOSIS — J9610 Chronic respiratory failure, unspecified whether with hypoxia or hypercapnia: Secondary | ICD-10-CM | POA: Insufficient documentation

## 2018-01-17 DIAGNOSIS — I5022 Chronic systolic (congestive) heart failure: Secondary | ICD-10-CM

## 2018-01-17 DIAGNOSIS — Q780 Osteogenesis imperfecta: Secondary | ICD-10-CM | POA: Insufficient documentation

## 2018-01-17 DIAGNOSIS — N189 Chronic kidney disease, unspecified: Secondary | ICD-10-CM | POA: Insufficient documentation

## 2018-01-17 DIAGNOSIS — G4733 Obstructive sleep apnea (adult) (pediatric): Secondary | ICD-10-CM | POA: Insufficient documentation

## 2018-01-17 DIAGNOSIS — Z6841 Body Mass Index (BMI) 40.0 and over, adult: Secondary | ICD-10-CM | POA: Insufficient documentation

## 2018-01-17 DIAGNOSIS — Z8249 Family history of ischemic heart disease and other diseases of the circulatory system: Secondary | ICD-10-CM | POA: Diagnosis not present

## 2018-01-17 DIAGNOSIS — Z7982 Long term (current) use of aspirin: Secondary | ICD-10-CM | POA: Diagnosis not present

## 2018-01-17 DIAGNOSIS — Z9981 Dependence on supplemental oxygen: Secondary | ICD-10-CM | POA: Insufficient documentation

## 2018-01-17 DIAGNOSIS — I272 Pulmonary hypertension, unspecified: Secondary | ICD-10-CM | POA: Diagnosis not present

## 2018-01-17 DIAGNOSIS — Z79899 Other long term (current) drug therapy: Secondary | ICD-10-CM | POA: Diagnosis not present

## 2018-01-17 DIAGNOSIS — I130 Hypertensive heart and chronic kidney disease with heart failure and stage 1 through stage 4 chronic kidney disease, or unspecified chronic kidney disease: Secondary | ICD-10-CM | POA: Insufficient documentation

## 2018-01-17 DIAGNOSIS — Z993 Dependence on wheelchair: Secondary | ICD-10-CM | POA: Diagnosis not present

## 2018-01-17 DIAGNOSIS — I428 Other cardiomyopathies: Secondary | ICD-10-CM | POA: Diagnosis not present

## 2018-01-17 MED ORDER — HYDRALAZINE HCL 100 MG PO TABS: 100.0000 mg | ORAL_TABLET | Freq: Three times a day (TID) | ORAL | 2 refills | Status: DC

## 2018-01-17 NOTE — Patient Instructions (Signed)
Increase Hydralazine to 100 mg Three times a day   You have been referred to Dr Radford Pax for sleep apnea, her office will call you to schedule  Your physician recommends that you schedule a follow-up appointment in: 3 months

## 2018-01-17 NOTE — Progress Notes (Signed)
  Echocardiogram 2D Echocardiogram has been performed.  Jerry Edwards L Androw 01/17/2018, 11:22 AM

## 2018-01-17 NOTE — Progress Notes (Signed)
Patient ID: Jerry Edwards, male   DOB: Oct 31, 1970, 47 y.o.   MRN: [id number]   [facility]     Primary Physician: Patient, No Pcp Per Primary Cardiologist: [name] HF: [name]   HPI: Jerry Edwards is a 47 y.o. male with history of NICM, Chronic systolic heart failure, HTN, OSA, and osteogenic imperfecta.   Admitted to [facility] 1/17 through 04/21/13 with increased dyspnea and mild volume overload. Question regarding possible infiltrative disease. ECHO 04/08/2013. EF 25%. cMRI 12/15 EF 38% possible infiltrative disease with discrete areas of non-coronary pattern delayed enhancement. Coronaries were normal on LHC.  SPEP no M spike and UPEP no free light chains. HIV negative and TSH nl.CT of chest no evidence of pulmonary infiltrates. D/C weight 193 pounds.   He was admitted in January 2017 with recurrent HF, severe HTN and hypoxemia. Diuresed and discharged home. Saw Dr. Acie Fredrickson a few weeks and was not taking Bidl because he couldn't afford. So given scripts for hydralazine and Imdur and now can afford. Also started on home O2 for sats in 70s.   Admitted 02/21/16 with acute respiratory failure. Treated with IV lasix but slowed down with creatinine rise. Also had hypertensive emergency on arrival requiring IV NTG. Likely due to non-compliance, as he had stopped several of his medications.    Admitted 7/23 - 10/15/16 with mild chest discomfort lasting up to 30 minutes that improved with rest. It was worse after eating but not consistent, it would also occur at rest. Occasionally associated with diaphoresis and dyspnea. Echo and Stress test as below.   Had PYP scan on 11/06/16 with ratio of 1.2 (felt to be negative) - unable to have MRI (couldnt fit)  Admitted 0/10-11/23/21 with A/C systolic HF. Diuresed with IV lasix and transitioned back to lasix 80/40. Echo showed lower EF 25-30%. HF medications optimized. DC weight: 216 lbs  Sleep study 8/19 AHI 88/hr  He presents today for HF follow up. Says he  feels fine. Denies SOS, orhopnea or PND. No CP. Edema well controlled. Taking all meds.   Echo today EF ~30%  Review of systems complete and found to be negative unless listed in HPI.   Echo 09/20/17: EF 25-30%, grade 2 DD  10/13/16  LVEF 35-40%, Grade 1 DD, Mild MR, Mild LAE  Myoview 10/15/16  No change in EKG from baseline T wave inversions in the inferolateral leads  There is a medium defect of mild severity present in the basal inferior, mid inferior and apical inferior location. The defect is non-reversible and consistent with prior infarct.  There is a small defect of moderate severity present in the apex location. The defect is partially reversible and consistent with infarct with possible small area of peri infarct ischemia.  This is a high risk study.  EF not calculated but visually appears to be moderately to severely reduced.  Echo 1/17 30-35% RV ok  Echo 02/22/16 LVEF 40%, Grade 1 DD.    LHC/RHC 04/08/13  Normal coronaries RA pressure:  39/39 with a mean of 34. Prominent X. and Y. Descends.  RV pressure: 76/26  mean 4 PA pressure: 70/36 with a mean of 51  Pulmonary capillary wedge pressure: 31/30 and mean of 29  LV pressure: 140/26 with a left ventricular end-diastolic pressure of 35  PA sat 66%  AO sat 97%  CO/CI 4.94/2.63  Pulmonary vascular resistance: 4.45 Woods units   PMH: 1. HTN 2. Osteogenesis imperfecta: wheelchair-bound.  History of bone fractures.  3. Suspect OSA 4.  CKD 5. Nonischemic cardiomyopathy: Echo (1/15) with EF 20-25%, severe diffuse hypokinesis, mild MR, mildly enlarged RV with moderately decreased systolic function.  LHC/RHC (1/15) with normal coronaries, mean RA 34, PA 70/36 mean 51, mean PCWP 29, CI 2.63.  Cardiac MRI (1/15) with EF 38%, global hypokinesis, discrete areas of delayed enhancement in the subepicardial mid inferoseptal RV insertion site, the mid wall of the mid inferolateral wall, and the subendocardial mid anterolateral wall.   This pattern was suggestive of infiltrative disease.  UPEP/SPEP negative, HIV negative.  Chest CT to look for evidence of sarcoidosis did not show any sarcoid-type lesions.    SH:  Single, prior smoking now quit, occasional marijuana, works as Dispensing optician.   FH:  HTN, no significant cardiac disease.   Current Outpatient Medications  Medication Sig Dispense Refill  . aspirin 81 MG tablet Take 1 tablet (81 mg total) by mouth daily.    . carvedilol (COREG) 12.5 MG tablet Take 1 tablet (12.5 mg total) by mouth 2 (two) times daily with a meal. 60 tablet 11  . furosemide (LASIX) 80 MG tablet TAKE 1 TABLET BY MOUTH ONCE DAILY IN THE MORNING AND THEN, TAKE 1/2 (ONE-HALF) TABLET BY MOUTH ONCE DAILY IN THE EVENING 135 tablet 3  . hydrALAZINE (APRESOLINE) 25 MG tablet Take 3 tablets (75 mg total) by mouth 3 (three) times daily. 270 tablet 11  . isosorbide mononitrate (IMDUR) 30 MG 24 hr tablet TAKE 1 TABLET BY MOUTH ONCE DAILY 30 tablet 3  . potassium chloride SA (K-DUR,KLOR-CON) 20 MEQ tablet Take 2 tablets (40 mEq total) by mouth daily. 180 tablet 3  . sacubitril-valsartan (ENTRESTO) 97-103 MG Take 1 tablet by mouth 2 (two) times daily. 180 tablet 3   No current facility-administered medications for this encounter.     PHYSICAL EXAM: Vitals:   01/17/18 1140  BP: (!) 142/80  Pulse: 79  SpO2: (!) 89%  Weight: 103.4 kg (228 lb)    Wt Readings from Last 3 Encounters:  01/17/18 103.4 kg (228 lb)  11/07/17 102.1 kg (225 lb)  11/03/17 102 kg (224 lb 12.8 oz)    General: Small stature. In wheelchair.  No resp difficulty. HEENT: normal Neck: thick supple. Unable to assess JVP due to size. Carotids 2+ bilat; no bruits. No lymphadenopathy or thryomegaly appreciated. Cor: PMI nondisplaced. Regular rate & rhythm. No rubs, gallops or murmurs. Lungs: barrel-chested clear Abdomen: obese soft, nontender, nondistended. No hepatosplenomegaly. No bruits or masses. Good bowel sounds. Extremities: no cyanosis,  clubbing, rash, trace edema Neuro: alert & orientedx3, cranial nerves grossly intact. moves all 4 extremities w/o difficulty. Affect pleasant   ASSESSMENT & PLAN: 1. Chronic Systolic Heart Failure. NICM EF 25% with no coronary disease on LHC in 1/15. 03/2013 cMRI possible infiltrative disease. SPEP and UPEP ok.  - EF 10/13/2016 35-40%. Myoview high risk due to LV dysfunction with mild inferior scar  - Based on the cMRI pattern 12/15 and concern for cardiac sarcoidosis, a chest CT was done to look for evidence of pulmonary sarcoidosis.  This was not indicative of pulmonary sarcoidosis.  Patient has had no significant arrhythmias detected.  QRS on ECG is not widened.  Can consider PET scan - Pt unable to fit in cMRI machine due to body stature.  PYP scan 8/18 no amyloid - Echo 09/2017: EF 25-30%, grade 2 DD - Echo today reviewed personally EF ~ 30-35% - NYHA class II symptoms. Volume status stable on exam despite weight gain - Continue lasix 80 mg am,  40 mg pm. Stable labs with creatinine 0.99, K 3.8 on 7/17 - Continue coreg 12.5 mg BID - Continue Entresto 97/103 mg BID.  - Increase hydralazine to 100mg  TID. Continue imdur 30 mg daily.   - Continue spiro 25 mg daily  - Reinforced fluid restriction to < 2 L daily, sodium restriction to less than 2000 mg daily, and the importance of daily weights.   - We reviewed echo results together and EF slightly improved at 30-35%. Will start CPAP and see if EF improves. If EF remains <= 35% Will need ICD 2. Osteogenesis imperfecta  - Wheel chair bound; limited mobility. No change.   3. HTN. - Elevated today. Increase hydralazine as above.  4. OSA  - Wears O2 at night.  - Had PSG on 11/07/17 with AHI 88/hr c/w very severe OSA. Was given a mask but no machine - Refer to Dr. Radford Pax for CPAP 5. Morbid Obesity - Body mass index is 49.34 kg/m. - Encouraged need to lose weight. Suggested [facility] Diet  6. Pulmonary hypertension- This appears to be primarily  pulmonary venous hypertension on previous RHC.  There may be a component of PAH related to OSA.   - No change.  7. Chronic respiratory failure - Continue O2. Encouraged him to wear at all times.  - Start CPAP - Encouraged weight loss.     Glori Bickers, MD  01/17/2018 11:44 AM

## 2018-01-27 MED ORDER — PERFLUTREN LIPID MICROSPHERE
1.0000 mL | INTRAVENOUS | Status: AC | PRN
  Administered 2018-01-17: 2 mL via INTRAVENOUS

## 2018-03-03 ENCOUNTER — Other Ambulatory Visit (HOSPITAL_COMMUNITY): Payer: Self-pay | Admitting: Internal Medicine

## 2018-03-04 ENCOUNTER — Other Ambulatory Visit (HOSPITAL_COMMUNITY): Payer: Self-pay

## 2018-03-04 MED ORDER — HYDRALAZINE HCL 100 MG PO TABS: 100.0000 mg | ORAL_TABLET | Freq: Three times a day (TID) | ORAL | 2 refills | Status: DC

## 2018-03-29 ENCOUNTER — Other Ambulatory Visit: Payer: Self-pay | Admitting: Ophthalmology

## 2018-03-29 DIAGNOSIS — L980 Pyogenic granuloma: Secondary | ICD-10-CM | POA: Diagnosis not present

## 2018-03-29 DIAGNOSIS — D3101 Benign neoplasm of right conjunctiva: Secondary | ICD-10-CM | POA: Diagnosis not present

## 2018-03-29 DIAGNOSIS — D2310 Other benign neoplasm of skin of unspecified eyelid, including canthus: Secondary | ICD-10-CM | POA: Diagnosis not present

## 2018-04-20 ENCOUNTER — Telehealth (HOSPITAL_COMMUNITY): Payer: Self-pay

## 2018-04-20 ENCOUNTER — Other Ambulatory Visit: Payer: Self-pay

## 2018-04-20 ENCOUNTER — Ambulatory Visit (HOSPITAL_COMMUNITY)
Admission: RE | Admit: 2018-04-20 | Discharge: 2018-04-20 | Disposition: A | Discharge status: Home / Self Care | Payer: Medicare Other | Source: Ambulatory Visit | Attending: Internal Medicine | Admitting: Internal Medicine

## 2018-04-20 ENCOUNTER — Encounter (HOSPITAL_COMMUNITY): Payer: Self-pay | Admitting: Internal Medicine

## 2018-04-20 VITALS — BP 158/94 | HR 86 | Wt 234.2 lb

## 2018-04-20 DIAGNOSIS — I10 Essential (primary) hypertension: Secondary | ICD-10-CM

## 2018-04-20 DIAGNOSIS — I428 Other cardiomyopathies: Secondary | ICD-10-CM | POA: Diagnosis not present

## 2018-04-20 DIAGNOSIS — Q780 Osteogenesis imperfecta: Secondary | ICD-10-CM | POA: Diagnosis not present

## 2018-04-20 DIAGNOSIS — Z9981 Dependence on supplemental oxygen: Secondary | ICD-10-CM | POA: Insufficient documentation

## 2018-04-20 DIAGNOSIS — Z6841 Body Mass Index (BMI) 40.0 and over, adult: Secondary | ICD-10-CM | POA: Diagnosis not present

## 2018-04-20 DIAGNOSIS — G4733 Obstructive sleep apnea (adult) (pediatric): Secondary | ICD-10-CM

## 2018-04-20 DIAGNOSIS — I5022 Chronic systolic (congestive) heart failure: Secondary | ICD-10-CM | POA: Insufficient documentation

## 2018-04-20 DIAGNOSIS — Z8249 Family history of ischemic heart disease and other diseases of the circulatory system: Secondary | ICD-10-CM | POA: Insufficient documentation

## 2018-04-20 DIAGNOSIS — Z7982 Long term (current) use of aspirin: Secondary | ICD-10-CM | POA: Diagnosis not present

## 2018-04-20 DIAGNOSIS — Z87891 Personal history of nicotine dependence: Secondary | ICD-10-CM | POA: Insufficient documentation

## 2018-04-20 DIAGNOSIS — Z79899 Other long term (current) drug therapy: Secondary | ICD-10-CM | POA: Diagnosis not present

## 2018-04-20 DIAGNOSIS — N189 Chronic kidney disease, unspecified: Secondary | ICD-10-CM | POA: Insufficient documentation

## 2018-04-20 DIAGNOSIS — Z993 Dependence on wheelchair: Secondary | ICD-10-CM | POA: Diagnosis not present

## 2018-04-20 DIAGNOSIS — I272 Pulmonary hypertension, unspecified: Secondary | ICD-10-CM | POA: Insufficient documentation

## 2018-04-20 DIAGNOSIS — J9610 Chronic respiratory failure, unspecified whether with hypoxia or hypercapnia: Secondary | ICD-10-CM | POA: Diagnosis not present

## 2018-04-20 DIAGNOSIS — I130 Hypertensive heart and chronic kidney disease with heart failure and stage 1 through stage 4 chronic kidney disease, or unspecified chronic kidney disease: Secondary | ICD-10-CM | POA: Diagnosis not present

## 2018-04-20 NOTE — Telephone Encounter (Signed)
Enrollment application for Entresto 97/103mg  faxed per MD Bensimhom

## 2018-04-20 NOTE — Patient Instructions (Signed)
You have been referred to Nutrition and Diabetic Education, this office will call and schedule and appointment with you.  Entresto 97/103mg  prescription has been sent to your pharmacy, our office will fax the Enrollment Application to the [employer].  You have been given 2 bottles of 49/51mg  tablets, please take one of these twice a day until your application for the 97/103mg  prescription has been approved.   Your physician recommends that you schedule a follow-up appointment in 4 months.

## 2018-04-20 NOTE — Progress Notes (Signed)
2 bottles of Entresto 49/51mg  given to patient

## 2018-04-20 NOTE — Progress Notes (Signed)
Patient ID: Jerry Edwards, male   DOB: 1970/04/04, 48 y.o.   MRN: [id number]   [facility]     Primary Physician: Patient, No Pcp Per Primary Cardiologist: [name] [facility]: [name]   HPI: Jerry Edwards is a 48 y.o. male with history of NICM, Chronic systolic heart failure, HTN, OSA, and osteogenic imperfecta.   Admitted to [facility] 1/17 through 04/21/13 with increased dyspnea and mild volume overload. Question regarding possible infiltrative disease. ECHO 04/08/2013. EF 25%. cMRI 12/15 EF 38% possible infiltrative disease with discrete areas of non-coronary pattern delayed enhancement. Coronaries were normal on LHC.  SPEP no M spike and UPEP no free light chains. HIV negative and TSH nl.CT of chest no evidence of pulmonary infiltrates. D/C weight 193 pounds.   He was admitted in January 2017 with recurrent HF, severe HTN and hypoxemia. Diuresed and discharged home. Saw Dr. Acie Fredrickson a few weeks and was not taking Bidl because he couldn't afford. So given scripts for hydralazine and Imdur and now can afford. Also started on home O2 for sats in 70s.   Admitted 02/21/16 with acute respiratory failure. Treated with IV lasix but slowed down with creatinine rise. Also had hypertensive emergency on arrival requiring IV NTG. Likely due to non-compliance, as he had stopped several of his medications.    Admitted 7/23 - 10/15/16 with mild chest discomfort lasting up to 30 minutes that improved with rest. It was worse after eating but not consistent, it would also occur at rest. Occasionally associated with diaphoresis and dyspnea. Echo and Stress test as below.   Had PYP scan on 11/06/16 with ratio of 1.2 (felt to be negative) - unable to have MRI (couldnt fit)  Admitted 6/96-09/28/91 with A/C systolic HF. Diuresed with IV lasix and transitioned back to lasix 80/40. Echo showed lower EF 25-30%. HF medications optimized. DC weight: 216 lbs  Sleep study 8/19 AHI 88/hr  He presents today for HF follow up. At last  visit hydralazine increased to 100 tid for elevated BP. Encouraged him to use CPAP but doesn't have the machine - sees Dr. 2/17. Says he is doing ok. Weight up 6 pounds. Denies SOB unless he is moving around a lot. No CP, edema, orthopnea or PND. Compliant with meds. Ran out of Entresto 3 weeks ago. Trying [place].  Says he feels fine. Denies SOS, orhopnea or PND. No CP. Edema well controlled. Taking all meds.   Echo  10/19 EF 30-35%  Echo 09/20/17: EF 25-30%, grade 2 DD Echo 10/13/16 LVEF 35-40%, Grade 1 DD, Mild MR, Mild LAE Echo 1/17 30-35% RV ok  Echo 02/22/16 LVEF 40%, Grade 1 DD.      Myoview 10/15/16  No change in EKG from baseline T wave inversions in the inferolateral leads  There is a medium defect of mild severity present in the basal inferior, mid inferior and apical inferior location. The defect is non-reversible and consistent with prior infarct.  There is a small defect of moderate severity present in the apex location. The defect is partially reversible and consistent with infarct with possible small area of peri infarct ischemia.  This is a high risk study.  EF not calculated but visually appears to be moderately to severely reduced.   LHC/RHC 04/08/13  Normal coronaries RA pressure:  39/39 with a mean of 34. Prominent X. and Y. Descends.  RV pressure: 76/26  mean 4 PA pressure: 70/36 with a mean of 51  Pulmonary capillary wedge pressure: 31/30 and mean  of 29  LV pressure: 140/26 with a left ventricular end-diastolic pressure of 35  PA sat 66%  AO sat 97%  CO/CI 4.94/2.63  Pulmonary vascular resistance: 4.45 Woods units   PMH: 1. HTN 2. Osteogenesis imperfecta: wheelchair-bound.  History of bone fractures.  3. Suspect OSA 4. CKD 5. Nonischemic cardiomyopathy: Echo (1/15) with EF 20-25%, severe diffuse hypokinesis, mild MR, mildly enlarged RV with moderately decreased systolic function.  LHC/RHC (1/15) with normal coronaries, mean RA 34, PA 70/36 mean 51,  mean PCWP 29, CI 2.63.  Cardiac MRI (1/15) with EF 38%, global hypokinesis, discrete areas of delayed enhancement in the subepicardial mid inferoseptal RV insertion site, the mid wall of the mid inferolateral wall, and the subendocardial mid anterolateral wall.  This pattern was suggestive of infiltrative disease.  UPEP/SPEP negative, HIV negative.  Chest CT to look for evidence of sarcoidosis did not show any sarcoid-type lesions.    SH:  Single, prior smoking now quit, occasional marijuana, works as Dispensing optician.   FH:  HTN, no significant cardiac disease.   Current Outpatient Medications  Medication Sig Dispense Refill  . aspirin 81 MG tablet Take 1 tablet (81 mg total) by mouth daily.    . carvedilol (COREG) 12.5 MG tablet Take 1 tablet (12.5 mg total) by mouth 2 (two) times daily with a meal. 60 tablet 11  . furosemide (LASIX) 80 MG tablet TAKE 1 TABLET BY MOUTH ONCE DAILY IN THE MORNING AND THEN, TAKE 1/2 (ONE-HALF) TABLET BY MOUTH ONCE DAILY IN THE EVENING 135 tablet 3  . hydrALAZINE (APRESOLINE) 100 MG tablet Take 1 tablet (100 mg total) by mouth 3 (three) times daily. 270 tablet 2  . isosorbide mononitrate (IMDUR) 30 MG 24 hr tablet TAKE 1 TABLET BY MOUTH ONCE DAILY 30 tablet 3  . potassium chloride SA (K-DUR,KLOR-CON) 20 MEQ tablet Take 2 tablets (40 mEq total) by mouth daily. 180 tablet 3  . sacubitril-valsartan (ENTRESTO) 97-103 MG Take 1 tablet by mouth 2 (two) times daily. 180 tablet 3   No current facility-administered medications for this encounter.     PHYSICAL EXAM: Vitals:   04/20/18 1045  BP: (!) 158/94  Pulse: 86  SpO2: 90%  Weight: 106.2 kg (234 lb 3.2 oz)  PF: (!) 2 L/min    Wt Readings from Last 3 Encounters:  04/20/18 106.2 kg (234 lb 3.2 oz)  01/17/18 103.4 kg (228 lb)  11/07/17 102.1 kg (225 lb)    General:  Short in stature. In WC No resp difficulty HEENT: normal Neck: supple. Hard to see JVP due to size . Carotids 2+ bilat; no bruits. No lymphadenopathy or  thryomegaly appreciated. Cor: PMI nondisplaced. Regular rate & rhythm. No rubs, gallops or murmurs. Lungs: clear Abdomen: markedly obese soft, nontender, nondistended. No hepatosplenomegaly. No bruits or masses. Good bowel sounds. Extremities: no cyanosis, clubbing, rash, trace edema Neuro: alert & orientedx3, cranial nerves grossly intact. moves all 4 extremities w/o difficulty. Affect pleasant    ASSESSMENT & PLAN: 1. Chronic Systolic Heart Failure. NICM EF 25% with no coronary disease on LHC in 1/15. 03/2013 cMRI possible infiltrative disease. SPEP and UPEP ok.  - EF 10/13/2016 35-40%. Myoview high risk due to LV dysfunction with mild inferior scar  - Based on the cMRI pattern 12/15 and concern for cardiac sarcoidosis, a chest CT was done to look for evidence of pulmonary sarcoidosis.  This was not indicative of pulmonary sarcoidosis.  Patient has had no significant arrhythmias detected.  QRS on ECG is not  widened.  Can consider PET scan - Pt unable to fit in cMRI machine due to body stature.  PYP scan 8/18 no amyloid - Echo 09/2017: EF 25-30%, grade 2 DD - Echo 10/19: EF  30-35% - Remains NYHA class II symptoms. Volume status stable on exam despite weight gain - Continue lasix 80 mg am, 40 mg pm - Continue coreg 12.5 mg BID - Resume Entresto 97/103 bid. Paperwork filled out today. Given samples - Continue hydralazine 100mg  TID. Continue imdur 30 mg daily.   - Continue spiro 25 mg daily  - Reinforced fluid restriction to < 2 L daily, sodium restriction to less than 2000 mg daily, and the importance of daily weights.   - We reviewed echo results together and EF slightly improved at 30-35%. Will start CPAP and see if EF improves. If EF remains <= 35% Will need ICD 2. Osteogenesis imperfecta  - Wheel chair bound; limited mobility. No change.   3. HTN. - Elevated today. Will restart Entresto  - Needs weight loss and CPAP 4. OSA  - Wears O2 at night.  - Had PSG on 11/07/17 with AHI 88/hr c/w  very severe OSA. Was given a mask but no machine - Has appt with Dr. Radford Pax for CPAP on 05/09/18 5. Morbid Obesity - Body mass index is 50.68 kg/m. - Encouraged need to lose weight. Suggested Zeba - has book but its not working he says - Will refer to nutritionist and if that doesn't help will send to weight loss clinic 6. Pulmonary hypertension- This appears to be primarily pulmonary venous hypertension on previous RHC.  There may be a component of PAH related to OSA.   - No change.  7. Chronic respiratory failure - Continue O2. Encouraged him to wear at all times.  - Start CPAP as above - Encouraged weight loss.     Glori Bickers, MD  04/20/2018 11:11 AM

## 2018-04-27 ENCOUNTER — Encounter: Payer: Medicare Other | Attending: Internal Medicine | Admitting: Registered"

## 2018-04-27 ENCOUNTER — Encounter: Payer: Self-pay | Admitting: Registered"

## 2018-04-27 DIAGNOSIS — I10 Essential (primary) hypertension: Secondary | ICD-10-CM | POA: Diagnosis not present

## 2018-04-27 DIAGNOSIS — Z713 Dietary counseling and surveillance: Secondary | ICD-10-CM | POA: Insufficient documentation

## 2018-04-27 NOTE — Progress Notes (Signed)
Medical Nutrition Therapy:  Appt start time: 1000 end time:  1100.  This patient is accompanied in the office by his mother. Pt is wheelchair bound  Assessment:  Primary concerns today: Pt states he would like to take less medicine and improve his heart health. Pt states he sees his MD every 4 months and would like to lose some weight.    Pt reports due to congestive heart disease he limits fluids to 2 L per day, and aims to eat a low sodium diet (estimates intake, no formal tracking method). Pt states he was weighing himself daily and taking the log to MD appointments, but after his MD states he didn't need to bring the log to each appointment, he got out of the habit of daily weighing.  Pt states he is not hungry in the morning but eats breakfast in the morning anyway to take medicine. Pt states he often skips lunch or may just have some grapes when he is sitting with his 58 yr old daughter while she eats lunch. 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 Pt states he got into this sleep habit when he was working nights about a year ago (states he is no longer employed). Pt states he naps during the day, dozes while watching TV.  Pt states he enjoys cooking and usually cooks enough for dinner that there are left overs. Pt states kids in the house will snack on left overs though so not available for another meal. Pt had questions about plate side, portion control and meal plans. RD assured patient that we would work on meal planning together in future visits.  Pt states he used to have a CPAP and thought he turned it in when he got oxygen, but there is some mixup what happened to the machine. Pt states he is due to get another in a couple weeks and plans to start using it again.  Preferred Learning Style:  No  preference indicated   Learning Readiness:   Ready  MEDICATIONS: reviewed   DIETARY INTAKE:  Usual eating pattern includes 2-3 meals and 1-2 snacks per day.  Everyday foods include ~10 oz sweet beverage.  Avoided foods include peanuts, fish (allergies)  24-hr recall:  B ( AM): boiled egg, 2 strips [place] bacon OR veggie omelet, fruit, yogurt OR grits  Snk ( AM): none (was told to cut back) OR fruit cups  L ( PM): [place] sandwich, low salt chips,V-8 sm can. Snk ( PM): fruit D ( PM): baked chicken olive oil, onions, mushrooms, peppers Snk ( PM): none OR ice cream, sprinkle corn flakes on top Beverages: water, koolaid, sweet tea ~12 oz cup with a lot of ice  Usual physical activity: pushing himself in wheelchair  Estimated energy needs: 2000-2200 calories   Progress Towards Goal(s):  New goals.   Nutritional Diagnosis:  NI-5.8.5 Inadeqate fiber intake As related to limited vegetable and whole grain intake.  As evidenced by dietary recall.    Intervention:  Nutrition Education. Discussed balanced eating. Discussed meal timing and structure. Discussed role of sleep in health. Discussed different types of movement (activity vs structured exercise).   Goal: Make lunch a priority, aiming for 1/2 plate non-starchy vegetables. Consider cooking enough for dinner to make more time in  the evening for your creative work. Look for [employer] that will give you ideas for physical activity Continue following a low-sodium diet (check the V-8 to make sure it doesn't add too much to your sodium in your meal) Consider asking your doctor if they want to you do daily weight checks Read through the sleep tips and see if any of them will help you get some better rest.  Teaching Method Utilized:  Visual Auditory  Handouts given during visit include:  MyPlate Planner  Sleep Hygiene  Barriers to learning/adherence to lifestyle change: none  Demonstrated degree of understanding via:   Teach Back   Monitoring/Evaluation:  Dietary intake, exercise, and body weight in 6 week(s).

## 2018-04-27 NOTE — Patient Instructions (Signed)
Goals: Make lunch a priority, aiming for 1/2 plate non-starchy vegetables. 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 Look for [employer] that will give you ideas for physical activity Continue following a low-sodium diet (check the V-8 to make sure it doesn't add too much to your sodium in your meal) Consider asking your doctor if they want to you do daily weight checks Read through the sleep tips and see if any of them will help you get some better rest.

## 2018-05-06 ENCOUNTER — Telehealth: Payer: Self-pay

## 2018-05-06 DIAGNOSIS — G4733 Obstructive sleep apnea (adult) (pediatric): Secondary | ICD-10-CM

## 2018-05-06 NOTE — Telephone Encounter (Signed)
-----   Message from Sueanne Margarita, MD sent at 05/06/2018  3:49 PM EST ----- Please order a Respironics BiPAP at 23/69mmHg with Airfit F30 mask with heated humidity and supplemental O2 at 4 L with PAPand supplies.  Will need followup with me in 10 weeks.  Needs overnight pulse ox on O2 and BIPAP

## 2018-05-06 NOTE — Telephone Encounter (Signed)
Spoke with the patient, he expressed understanding about the BiPAP, sending a message to our sleep coordinator to schedule. The patient had no further questions.

## 2018-05-09 ENCOUNTER — Ambulatory Visit: Payer: Medicare Other | Admitting: Cardiology

## 2018-05-09 NOTE — Telephone Encounter (Signed)
Order placed today to [facility] via fax.

## 2018-05-20 ENCOUNTER — Telehealth (HOSPITAL_COMMUNITY): Payer: Self-pay | Admitting: Licensed Clinical Social Worker

## 2018-05-20 NOTE — Telephone Encounter (Signed)
CSW received notification that pt is currently not eligible for [facility] assistance through [employer] and suggesting he apply for [facility] Medicaid.  CSW called pt to inform- he will plan to follow up with Medicaid and get 3 months of temporary assistance through the [employer] program in the meantime- encouraged pt to call clinic if Medicaid doesn't work out and he continues to need assistance.  Jorge Ny, LCSW Clinical Social Worker [facility] [phone]

## 2018-05-23 DIAGNOSIS — J449 Chronic obstructive pulmonary disease, unspecified: Secondary | ICD-10-CM | POA: Diagnosis not present

## 2018-05-23 DIAGNOSIS — R0902 Hypoxemia: Secondary | ICD-10-CM | POA: Diagnosis not present

## 2018-05-25 NOTE — Telephone Encounter (Signed)
Sleep study read by Dr Keturah Barre. Informed patient of sleep study results and patient understanding was verbalized. Patient understands his sleep study showed DIAGNOSIS - Obstructive Sleep Apnea (327.23 [G47.33 ICD-10]) - Nocturnal Hypoxemia (327.26 [G47.36 ICD-10]) Patient understands Dr Keturah Barre recommends RECOMMENDATIONS - Recommend BiPAP 23/17 with supplemental O2 at 4L during sleep. Patient used a medium RESMED AirFit full face mask with heated humidifier. - Avoid alcohol, sedatives and other CNS depressants that may worsen sleep apnea and disrupt normal sleep architecture. - Sleep hygiene should be reviewed to assess factors that may improve sleep quality. - Weight management and regular exercise should be initiated or continued.  Upon patient request DME selection is [facility] Patient understands he will be contacted by [facility] to set up his cpap. Patient understands to call if [facility] does not contact him with new setup in a timely manner. Patient understands they will be called once confirmation has been received from [facility] that they have received their new machine to schedule 10 week follow up appointment.  [facility] notified of new cpap order  Please add to airview Patient was grateful for the call and thanked me.  Patient has a 10 week follow up appointment scheduled for 08/16/18. Patient understands he needs to keep this appointment for insurance compliance. Patient was grateful for the call and thanked me.

## 2018-06-09 ENCOUNTER — Encounter: Payer: Self-pay | Admitting: *Deleted

## 2018-06-09 ENCOUNTER — Telehealth: Payer: Self-pay | Admitting: *Deleted

## 2018-06-09 NOTE — Telephone Encounter (Signed)
[facility] states the patient was instructed to wear his 02 and Bipap during his over night pulse ox. Reached out to the patient and he states the test was done on 02 at 4  L and his Bipap. I have resent the order back to [place] to be ran again.

## 2018-06-09 NOTE — Telephone Encounter (Signed)
-----   Message from Sueanne Margarita, MD sent at 06/09/2018  3:23 PM EDT ----- Jerry Edwards,  Please find out if patient has started on his BiPAP.  I got his overnight pulse ox which was supposed to be done on BiPAP and O2 at 4L and it said it was done on room air?????  Need to call [facility] to find out what is going on and make sure patient is using his BIPAP

## 2018-06-09 NOTE — Telephone Encounter (Signed)
-----   Message from Sueanne Margarita, MD sent at 06/09/2018  3:23 PM EDT ----- Gae Bon,  Please find out if patient has started on his BiPAP.  I got his overnight pulse ox which was supposed to be done on BiPAP and O2 at 4L and it said it was done on room air?????  Need to call [facility] to find out what is going on and make sure patient is using his BIPAP

## 2018-06-09 NOTE — Telephone Encounter (Signed)
This encounter was created in error - please disregard.

## 2018-06-09 NOTE — Addendum Note (Signed)
Addended by: Freada Bergeron on: 06/09/2018 04:07 PM   Modules accepted: Orders

## 2018-06-28 ENCOUNTER — Ambulatory Visit: Payer: Medicare Other | Admitting: Registered"

## 2018-07-05 ENCOUNTER — Other Ambulatory Visit (HOSPITAL_COMMUNITY): Payer: Self-pay | Admitting: Internal Medicine

## 2018-07-29 ENCOUNTER — Other Ambulatory Visit (HOSPITAL_COMMUNITY): Payer: Self-pay | Admitting: Internal Medicine

## 2018-08-04 ENCOUNTER — Ambulatory Visit: Payer: Medicare Other | Admitting: Registered"

## 2018-08-10 ENCOUNTER — Telehealth: Payer: Self-pay | Admitting: Cardiology

## 2018-08-10 NOTE — Telephone Encounter (Signed)
Virtual Visit Pre-Appointment Phone Call  "(Name), I am calling you today to discuss your upcoming appointment. We are currently trying to limit exposure to the virus that causes COVID-19 by seeing patients at home rather than in the office."  1. "What is the BEST phone number to call the day of the visit?" - include this in appointment notes  2. Do you have or have access to (through a family member/friend) a smartphone with video capability that we can use for your visit?" a. If yes - list this number in appt notes as cell (if different from BEST phone #) and list the appointment type as a VIDEO visit in appointment notes b. If no - list the appointment type as a PHONE visit in appointment notes  3. Confirm consent - "In the setting of the current Covid19 crisis, you are scheduled for a (phone or video) visit with your provider on (date) at (time).  Just as we do with many in-office visits, in order for you to participate in this visit, we must obtain consent.  If you'd like, I can send this to your mychart (if signed up) or email for you to review.  Otherwise, I can obtain your verbal consent now.  All virtual visits are billed to your insurance company just like a normal visit would be.  By agreeing to a virtual visit, we'd like you to understand that the technology does not allow for your provider to perform an examination, and thus may limit your provider's ability to fully assess your condition. If your provider identifies any concerns that need to be evaluated in person, we will make arrangements to do so.  Finally, though the technology is pretty good, we cannot assure that it will always work on either your or our end, and in the setting of a video visit, we may have to convert it to a phone-only visit.  In either situation, we cannot ensure that we have a secure connection.  Are you willing to proceed?" STAFF: Did the patient verbally acknowledge consent to telehealth visit? Document  YES/NO here: [email or web address]/smartphone  [phone]/ verbal consent 08/10/18  4. Advise patient to be prepared - "Two hours prior to your appointment, go ahead and check your blood pressure, pulse, oxygen saturation, and your weight (if you have the equipment to check those) and write them all down. When your visit starts, your provider will ask you for this information. If you have an Apple Watch or Kardia device, please plan to have heart rate information ready on the day of your appointment. Please have a pen and paper handy nearby the day of the visit as well."  5. Give patient instructions for MyChart download to smartphone OR Doximity/Doxy.me as below if video visit (depending on what platform provider is using)  6. Inform patient they will receive a phone call 15 minutes prior to their appointment time (may be from unknown caller ID) so they should be prepared to answer    TELEPHONE CALL NOTE  Jerry Edwards has been deemed a candidate for a follow-up tele-health visit to limit community exposure during the Covid-19 pandemic. I spoke with the patient via phone to ensure availability of phone/video source, confirm preferred email & phone number, and discuss instructions and expectations.  I reminded Jerry Edwards to be prepared with any vital sign and/or heart rhythm information that could potentially be obtained via home monitoring, at the time of his visit. I reminded Jerry Edwards to expect a phone  call prior to his visit.  Armando Gang 08/10/2018 11:41 AM   INSTRUCTIONS FOR DOWNLOADING THE MYCHART APP TO SMARTPHONE  - The patient must first make sure to have activated MyChart and know their login information - If Apple, go to [employer] and type in MyChart in the search bar and download the app. If Android, ask patient to go to [employer] and type in [place] in the search bar and download the app. The app is free but as with any other app downloads, their phone may require  them to verify saved payment information or Apple/Android password.  - The patient will need to then log into the app with their MyChart username and password, and select [facility] as their healthcare provider to link the account. When it is time for your visit, go to the MyChart app, find appointments, and click Begin Video Visit. Be sure to Select Allow for your device to access the Microphone and Camera for your visit. You will then be connected, and your provider will be with you shortly.  **If they have any issues connecting, or need assistance please contact MyChart service desk [phone])**  **If using a computer, in order to ensure the best quality for their visit they will need to use either of the following Internet Browsers: [employer], or Google Chrome**  IF USING DOXIMITY or DOXY.ME - The patient will receive a link just prior to their visit by text.     FULL LENGTH CONSENT FOR TELE-HEALTH VISIT   I hereby voluntarily request, consent and authorize [facility] and its employed or contracted physicians, physician assistants, nurse practitioners or other licensed health care professionals (the Practitioner), to provide me with telemedicine health care services (the Services") as deemed necessary by the treating Practitioner. I acknowledge and consent to receive the Services by the Practitioner via telemedicine. I understand that the telemedicine visit will involve communicating with the Practitioner through live audiovisual communication technology and the disclosure of certain medical information by electronic transmission. I acknowledge that I have been given the opportunity to request an in-person assessment or other available alternative prior to the telemedicine visit and am voluntarily participating in the telemedicine visit.  I understand that I have the right to withhold or withdraw my consent to the use of telemedicine in the course of my care at any  time, without affecting my right to future care or treatment, and that the Practitioner or I may terminate the telemedicine visit at any time. I understand that I have the right to inspect all information obtained and/or recorded in the course of the telemedicine visit and may receive copies of available information for a reasonable fee.  I understand that some of the potential risks of receiving the Services via telemedicine include:   Delay or interruption in medical evaluation due to technological equipment failure or disruption;  Information transmitted may not be sufficient (e.g. poor resolution of images) to allow for appropriate medical decision making by the Practitioner; and/or   In rare instances, security protocols could fail, causing a breach of personal health information.  Furthermore, I acknowledge that it is my responsibility to provide information about my medical history, conditions and care that is complete and accurate to the best of my ability. I acknowledge that Practitioner's advice, recommendations, and/or decision may be based on factors not within their control, such as incomplete or inaccurate data provided by me or distortions of diagnostic images or specimens that may result from  electronic transmissions. I understand that the practice of medicine is not an exact science and that Practitioner makes no warranties or guarantees regarding treatment outcomes. I acknowledge that I will receive a copy of this consent concurrently upon execution via email to the email address I last provided but may also request a printed copy by calling the office of [facility].    I understand that my insurance will be billed for this visit.   I have read or had this consent read to me.  I understand the contents of this consent, which adequately explains the benefits and risks of the Services being provided via telemedicine.   I have been provided ample opportunity to ask questions  regarding this consent and the Services and have had my questions answered to my satisfaction.  I give my informed consent for the services to be provided through the use of telemedicine in my medical care  By participating in this telemedicine visit I agree to the above.

## 2018-08-16 ENCOUNTER — Encounter: Payer: Medicare Other | Admitting: Cardiology

## 2018-08-16 ENCOUNTER — Other Ambulatory Visit: Payer: Self-pay

## 2018-08-16 NOTE — Progress Notes (Signed)
erroroneous   This encounter was created in error - please disregard.

## 2018-08-22 ENCOUNTER — Telehealth (HOSPITAL_COMMUNITY): Payer: Self-pay | Admitting: *Deleted

## 2018-08-22 ENCOUNTER — Encounter (HOSPITAL_COMMUNITY): Payer: Medicare Other | Admitting: Internal Medicine

## 2018-08-22 NOTE — Telephone Encounter (Signed)
Pt left VM requesting a call from nurse. I called patient back no answer left VM for him to call back.

## 2018-08-23 ENCOUNTER — Other Ambulatory Visit (HOSPITAL_COMMUNITY): Payer: Self-pay

## 2018-08-23 ENCOUNTER — Telehealth (HOSPITAL_COMMUNITY): Payer: Self-pay | Admitting: Licensed Clinical Social Worker

## 2018-08-23 NOTE — Telephone Encounter (Signed)
CSW received call from pt regarding [employer] assistance.  Patient had been denied at the end of February and informed he needed to apply for Medicaid and get denial from them before he would be approved to continue with [employer] patient assistance.  Pt stated that he is now out of the 3 months of refills [employer] provided to him after his enrollment expired and has not applied for Medicaid.  Pt reports that he has attempted to apply for Medicaid but has been unable to get a hold of someone at DSS with everything going on with the Cor[place] virus.  CSW spoke with representative at [employer] to discuss.  They confirm they cannot approve pt for further assistance until they get denial notice from Medicaid.  They have initiated some protocols for people affected by [facility] virus but state the pt is not eligible since he was informed of need to apply for Medicaid prior to things shutting down.  CSW then spoke with pt and pt mom regarding alternative options.  Patient believes he might have medicare part D plan through [facility]- they were able to locate a card so I will help to verify if it is still active.  CSW also provided pt with number for [facility] so he could apply for Extra Help if he does have part D- this could assist with copay cost.  CSW will continue to follow and assist as needed  Jerry Edwards, [facility] Desk#: [phone] Cell#: [phone]

## 2018-08-23 NOTE — Telephone Encounter (Signed)
(  4) bottles of Entresto 49/51 (28 tab each) given to pt  Lot [id number]  EXP JUN 2022

## 2018-08-24 ENCOUNTER — Other Ambulatory Visit (HOSPITAL_COMMUNITY): Payer: Self-pay

## 2018-08-24 ENCOUNTER — Telehealth (HOSPITAL_COMMUNITY): Payer: Self-pay | Admitting: Licensed Clinical Social Worker

## 2018-08-24 MED ORDER — SACUBITRIL-VALSARTAN 97-103 MG PO TABS: 1.0000 | ORAL_TABLET | Freq: Two times a day (BID) | ORAL | 1 refills | Status: DC

## 2018-08-24 NOTE — Telephone Encounter (Signed)
CSW was able to verify with patient's pharmacy that he does have active Medicare part D plan.  CSW had clinic send in [place] prescription to see how much it would cost and they state it would be $8.95 cents for a 90 day supply of Entresto- based off of this and the fact that the patient does not get any premiums deducted for his part D plan it would appear that he is already enrolled in the Extra Help program through Medicaid.  CSW called pt to update and encouraged him to go pick up his [name] from his pharmacy today as he reports he is out.  CSW will continue to follow and assist as needed  Jorge Ny, [facility] Desk#: [phone] Cell#: [phone]

## 2018-08-25 ENCOUNTER — Other Ambulatory Visit (HOSPITAL_COMMUNITY): Payer: Self-pay

## 2018-08-25 MED ORDER — SACUBITRIL-VALSARTAN 97-103 MG PO TABS: 1.0000 | ORAL_TABLET | Freq: Two times a day (BID) | ORAL | 1 refills | Status: DC

## 2018-09-06 ENCOUNTER — Other Ambulatory Visit (HOSPITAL_COMMUNITY): Payer: Self-pay | Admitting: Internal Medicine

## 2018-09-07 MED ORDER — FUROSEMIDE 80 MG PO TABS: ORAL_TABLET | ORAL | 3 refills | Status: DC

## 2018-09-07 NOTE — Addendum Note (Signed)
Addended by: Valeda Malm on: 09/07/2018 04:17 PM   Modules accepted: Orders

## 2018-09-08 ENCOUNTER — Ambulatory Visit: Payer: Medicare Other | Admitting: Registered"

## 2018-10-28 ENCOUNTER — Other Ambulatory Visit (HOSPITAL_COMMUNITY): Payer: Self-pay | Admitting: Internal Medicine

## 2018-10-31 ENCOUNTER — Other Ambulatory Visit (HOSPITAL_COMMUNITY): Payer: Self-pay | Admitting: *Deleted

## 2018-10-31 MED ORDER — CARVEDILOL 12.5 MG PO TABS: 12.5000 mg | ORAL_TABLET | Freq: Two times a day (BID) | ORAL | 11 refills | Status: DC

## 2019-01-09 ENCOUNTER — Other Ambulatory Visit (HOSPITAL_COMMUNITY): Payer: Self-pay

## 2019-01-09 MED ORDER — POTASSIUM CHLORIDE CRYS ER 20 MEQ PO TBCR: 40.0000 meq | EXTENDED_RELEASE_TABLET | Freq: Every day | ORAL | 3 refills | Status: DC

## 2019-01-11 ENCOUNTER — Other Ambulatory Visit (HOSPITAL_COMMUNITY): Payer: Self-pay

## 2019-01-11 ENCOUNTER — Other Ambulatory Visit (HOSPITAL_COMMUNITY): Payer: Self-pay | Admitting: *Deleted

## 2019-01-11 MED ORDER — FUROSEMIDE 80 MG PO TABS: ORAL_TABLET | ORAL | 3 refills | Status: DC

## 2019-01-31 ENCOUNTER — Telehealth: Payer: Self-pay

## 2019-02-08 ENCOUNTER — Other Ambulatory Visit (HOSPITAL_COMMUNITY): Payer: Self-pay | Admitting: Internal Medicine

## 2019-02-18 IMAGING — NM NM TUMOR LOCAL/TRACER DISTR WITH SPECT
5 series · 20 of 20 positions shown · non-contrast
Comparison: 11/04/2016

CLINICAL DATA: History of chronic congestive heart failure.
Nonischemic cardiomyopathy and hypertension.

EXAM:
NM PYP CARDIAC AMYLOID LOCALIZATION WITH SPECT
TECHNIQUE: 21 mCi 77mPc-Y9Y were administered intravenously. Anterior and
posterior planar images of the chest were obtained. SPECT imaging of
the chest was also obtained. Region of interests of the lower right
and left chest were drawn by the technologist.
RADIOPHARMACEUTICALS:  21 mCi 77mPc-Y9Y

[Series 1: spect - (id)_(id)_cor · 1.6mm · 1.63mm/px · 6 of 256 frames shown]
[frame 22/256]
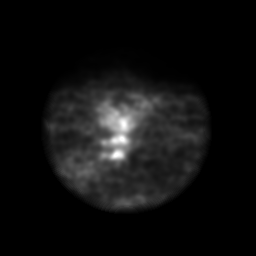
[frame 64/256]
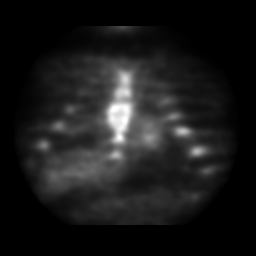
[frame 107/256]
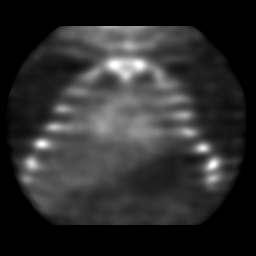
[frame 150/256]
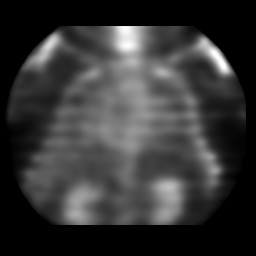
[frame 192/256]
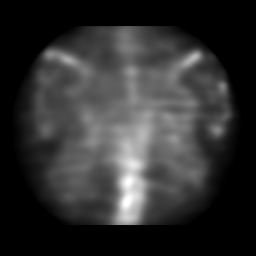
[frame 235/256]
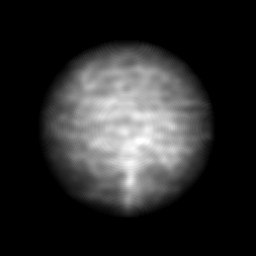

[Series 1: spect - (id)_(id)_tra · 1.6mm · 1.63mm/px · 6 of 256 frames shown]
[frame 22/256]
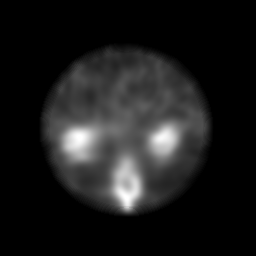
[frame 64/256]
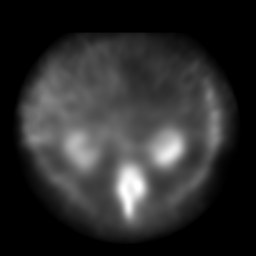
[frame 107/256]
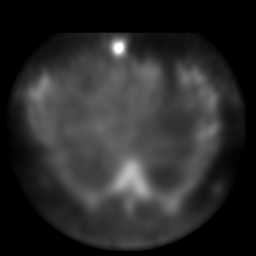
[frame 150/256]
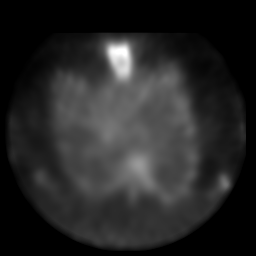
[frame 192/256]
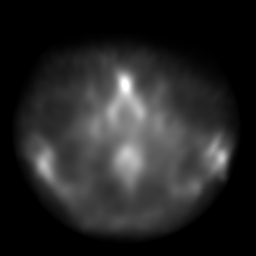
[frame 235/256]
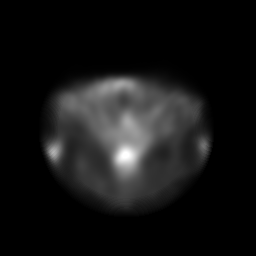

[Series 1: bone statics · 2.07mm/px · 1 of 1 slices shown (1 of 2)]
[im 1/1]
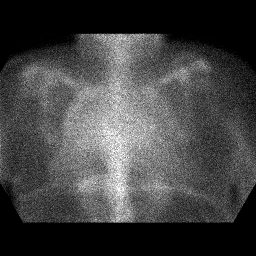

[Series 1: bone statics · 2.07mm/px · 1 of 1 slices shown (2 of 2)]
[im 1/1]
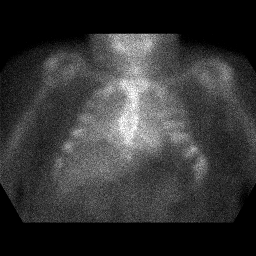

[Series 2: liver spect · 1.63mm/px · 6 of 64 frames shown]
[frame 6/64  full-range]
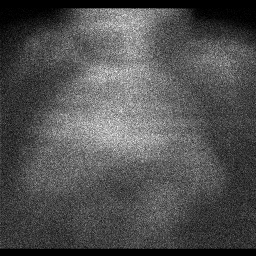
[frame 16/64  full-range]
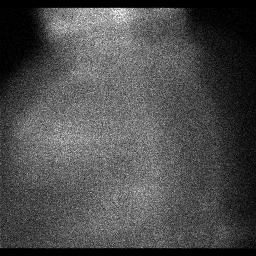
[frame 27/64  full-range]
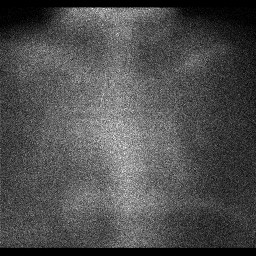
[frame 38/64  full-range]
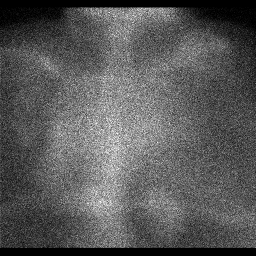
[frame 48/64  full-range]
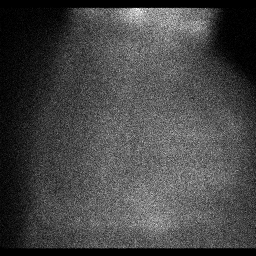
[frame 59/64  full-range]
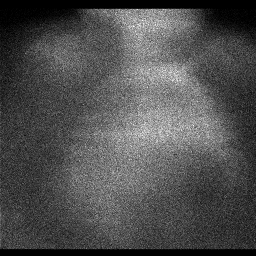

[20 of 20 positions shown; findings below may reference images not displayed]

FINDINGS: The overall imaging quality is excellent.

There is mild myocardial uptake. The myocardial uptake is less than
bone with a visual score of 1.

The heart to contralateral lung ratio equals to 1.15. Myocardial
grade is equal to 0. Heart to contralateral lung ratio equals 1.2.
CONCLUSION: 1. Exam results are equivocal for TTR amyloidosis: There is a
semi-quantitative visual score of 1 with A H/CL ratio 1.2.

2. Of note: A negative or mildly positive PYP does not exclude AL
amyloid. In addition, equivocal results could represent AL amyloid
or early TTR amyloid

## 2019-03-10 ENCOUNTER — Other Ambulatory Visit: Payer: Self-pay | Admitting: *Deleted

## 2019-03-10 NOTE — Patient Outreach (Signed)
[facility]) Care Management  03/10/2019  Jerry Edwards 1971-03-12 [id number]   RN Health Coach attempted follow up outreach call to patient.  Patient was unavailable. HIPPA compliance voicemail message left with return callback number.  Plan: RN will call patient again within 10 days.  Portal Care Management [phone]

## 2019-03-10 NOTE — Telephone Encounter (Signed)
This encounter was created in error - please disregard.

## 2019-03-10 NOTE — Addendum Note (Signed)
Addended by: Verlin Grills on: 03/10/2019 02:07 PM   Modules accepted: Level of Service, SmartSet

## 2019-03-14 ENCOUNTER — Other Ambulatory Visit: Payer: Self-pay | Admitting: *Deleted

## 2019-03-15 NOTE — Patient Outreach (Signed)
[facility]) Care Management   [id number]  Shanard Treto 20-Feb-1971 [id number]   RN Health Coach telephone call to patient.  [employer] compliance verified. Per patient he does not have a PCP. Patient stated that his PCP has left and that he has not gotten another one. RN gave him the [phone]. RN discussed what the role of the health coach will be. Patient agreed to call [facility] back once he gets a PCP. Per  Patient he will call and work on that right away.    [facility] Care Management [phone]

## 2019-03-18 ENCOUNTER — Other Ambulatory Visit (HOSPITAL_COMMUNITY): Payer: Self-pay | Admitting: Internal Medicine

## 2019-05-10 ENCOUNTER — Other Ambulatory Visit (HOSPITAL_COMMUNITY): Payer: Self-pay | Admitting: Internal Medicine

## 2019-05-17 ENCOUNTER — Telehealth (HOSPITAL_COMMUNITY): Payer: Self-pay

## 2019-05-17 NOTE — Telephone Encounter (Signed)
Received medical records request from [facility] [facility].  Records for last 3 years faxed to [phone].

## 2019-05-24 ENCOUNTER — Encounter: Payer: Self-pay | Admitting: Nurse Practitioner

## 2019-05-24 ENCOUNTER — Ambulatory Visit: Payer: Medicare Other | Attending: Nurse Practitioner | Admitting: Nurse Practitioner

## 2019-05-24 ENCOUNTER — Other Ambulatory Visit: Payer: Self-pay

## 2019-05-24 DIAGNOSIS — I428 Other cardiomyopathies: Secondary | ICD-10-CM | POA: Insufficient documentation

## 2019-05-24 DIAGNOSIS — Z993 Dependence on wheelchair: Secondary | ICD-10-CM | POA: Insufficient documentation

## 2019-05-24 DIAGNOSIS — Z8249 Family history of ischemic heart disease and other diseases of the circulatory system: Secondary | ICD-10-CM | POA: Diagnosis not present

## 2019-05-24 DIAGNOSIS — I110 Hypertensive heart disease with heart failure: Secondary | ICD-10-CM | POA: Diagnosis not present

## 2019-05-24 DIAGNOSIS — Q780 Osteogenesis imperfecta: Secondary | ICD-10-CM | POA: Insufficient documentation

## 2019-05-24 DIAGNOSIS — Z87891 Personal history of nicotine dependence: Secondary | ICD-10-CM | POA: Insufficient documentation

## 2019-05-24 DIAGNOSIS — I5022 Chronic systolic (congestive) heart failure: Secondary | ICD-10-CM | POA: Insufficient documentation

## 2019-05-24 DIAGNOSIS — Z823 Family history of stroke: Secondary | ICD-10-CM | POA: Diagnosis not present

## 2019-05-24 DIAGNOSIS — I10 Essential (primary) hypertension: Secondary | ICD-10-CM

## 2019-05-24 DIAGNOSIS — I272 Pulmonary hypertension, unspecified: Secondary | ICD-10-CM | POA: Diagnosis not present

## 2019-05-24 DIAGNOSIS — Z79899 Other long term (current) drug therapy: Secondary | ICD-10-CM | POA: Insufficient documentation

## 2019-05-24 DIAGNOSIS — Z7689 Persons encountering health services in other specified circumstances: Secondary | ICD-10-CM

## 2019-05-24 DIAGNOSIS — Z833 Family history of diabetes mellitus: Secondary | ICD-10-CM | POA: Diagnosis not present

## 2019-05-24 DIAGNOSIS — Z801 Family history of malignant neoplasm of trachea, bronchus and lung: Secondary | ICD-10-CM | POA: Insufficient documentation

## 2019-05-24 DIAGNOSIS — I5032 Chronic diastolic (congestive) heart failure: Secondary | ICD-10-CM | POA: Diagnosis present

## 2019-05-24 DIAGNOSIS — I509 Heart failure, unspecified: Secondary | ICD-10-CM
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
## 2019-06-02 ENCOUNTER — Other Ambulatory Visit: Payer: Medicare Other

## 2019-06-09 ENCOUNTER — Ambulatory Visit: Payer: Medicare Other | Admitting: Nurse Practitioner

## 2019-06-24 ENCOUNTER — Encounter: Payer: Self-pay | Admitting: Nurse Practitioner

## 2019-06-26 ENCOUNTER — Other Ambulatory Visit (HOSPITAL_COMMUNITY): Payer: Self-pay | Admitting: Internal Medicine

## 2019-06-28 ENCOUNTER — Ambulatory Visit (HOSPITAL_COMMUNITY)
Admission: RE | Admit: 2019-06-28 | Discharge: 2019-06-28 | Disposition: A | Discharge status: Home / Self Care | Payer: Medicare Other | Source: Ambulatory Visit | Attending: Internal Medicine | Admitting: Internal Medicine

## 2019-06-28 ENCOUNTER — Encounter (HOSPITAL_COMMUNITY): Payer: Self-pay | Admitting: Internal Medicine

## 2019-06-28 ENCOUNTER — Other Ambulatory Visit: Payer: Self-pay

## 2019-06-28 VITALS — BP 167/95 | HR 64 | Wt 224.2 lb

## 2019-06-28 DIAGNOSIS — Z7982 Long term (current) use of aspirin: Secondary | ICD-10-CM | POA: Diagnosis not present

## 2019-06-28 DIAGNOSIS — G4733 Obstructive sleep apnea (adult) (pediatric): Secondary | ICD-10-CM | POA: Diagnosis not present

## 2019-06-28 DIAGNOSIS — I5022 Chronic systolic (congestive) heart failure: Secondary | ICD-10-CM | POA: Insufficient documentation

## 2019-06-28 DIAGNOSIS — Z993 Dependence on wheelchair: Secondary | ICD-10-CM | POA: Insufficient documentation

## 2019-06-28 DIAGNOSIS — Z79899 Other long term (current) drug therapy: Secondary | ICD-10-CM | POA: Diagnosis not present

## 2019-06-28 DIAGNOSIS — N189 Chronic kidney disease, unspecified: Secondary | ICD-10-CM | POA: Insufficient documentation

## 2019-06-28 DIAGNOSIS — I428 Other cardiomyopathies: Secondary | ICD-10-CM | POA: Insufficient documentation

## 2019-06-28 DIAGNOSIS — I272 Pulmonary hypertension, unspecified: Secondary | ICD-10-CM | POA: Diagnosis not present

## 2019-06-28 DIAGNOSIS — Q780 Osteogenesis imperfecta: Secondary | ICD-10-CM | POA: Diagnosis not present

## 2019-06-28 DIAGNOSIS — J9610 Chronic respiratory failure, unspecified whether with hypoxia or hypercapnia: Secondary | ICD-10-CM | POA: Insufficient documentation

## 2019-06-28 DIAGNOSIS — Z8249 Family history of ischemic heart disease and other diseases of the circulatory system: Secondary | ICD-10-CM | POA: Insufficient documentation

## 2019-06-28 DIAGNOSIS — I130 Hypertensive heart and chronic kidney disease with heart failure and stage 1 through stage 4 chronic kidney disease, or unspecified chronic kidney disease: Secondary | ICD-10-CM | POA: Insufficient documentation

## 2019-06-28 DIAGNOSIS — I10 Essential (primary) hypertension: Secondary | ICD-10-CM

## 2019-06-28 DIAGNOSIS — Z6841 Body Mass Index (BMI) 40.0 and over, adult: Secondary | ICD-10-CM | POA: Insufficient documentation

## 2019-06-28 LAB — BASIC METABOLIC PANEL
Anion gap: 9 (ref 5–15)
BUN: 18 mg/dL (ref 6–20)
CO2: 29 mmol/L (ref 22–32)
Calcium: 8.4 mg/dL — ABNORMAL LOW (ref 8.9–10.3)
Chloride: 103 mmol/L (ref 98–111)
Creatinine, Ser: 1.54 mg/dL — ABNORMAL HIGH (ref 0.61–1.24)
GFR calc Af Amer: >60 mL/min (ref >60)
GFR calc non Af Amer: 52 mL/min — ABNORMAL LOW (ref >60)
Glucose, Bld: 88 mg/dL (ref 70–99)
Potassium: 4.1 mmol/L (ref 3.5–5.1)
Sodium: 141 mmol/L (ref 135–145)

## 2019-06-28 LAB — BRAIN NATRIURETIC PEPTIDE: B Natriuretic Peptide: 705.6 pg/mL — ABNORMAL HIGH (ref 0.0–100.0)

## 2019-06-28 LAB — CBC
HCT: 47 % (ref 39.0–52.0)
Hemoglobin: 13.4 g/dL (ref 13.0–17.0)
MCH: 28 pg (ref 26.0–34.0)
MCHC: 28.5 g/dL — ABNORMAL LOW (ref 30.0–36.0)
MCV: 98.1 fL (ref 80.0–100.0)
Platelets: 222 10*3/uL (ref 150–400)
RBC: 4.79 MIL/uL (ref 4.22–5.81)
RDW: 12.1 % (ref 11.5–15.5)
WBC: 5.8 10*3/uL (ref 4.0–10.5)
nRBC: 0 % (ref 0.0–0.2)

## 2019-06-28 MED ORDER — DOXAZOSIN MESYLATE 1 MG PO TABS: 1.0000 mg | ORAL_TABLET | Freq: Every day | ORAL | 6 refills | Status: DC

## 2019-06-28 NOTE — Progress Notes (Signed)
Patient ID: Jerry Edwards, male   DOB: 11-Jan-1971, 49 y.o.   MRN: [id number]   [facility]     Primary Physician: [name], NP Primary Cardiologist: [name] HF: [name]   HPI: Jerry Edwards is a 49 y.o. male with history of NICM, Chronic systolic heart failure, HTN, OSA, and osteogenic imperfecta.   Admitted to [facility] 1/17 through 04/21/13 with increased dyspnea and mild volume overload. Question regarding possible infiltrative disease. ECHO 04/08/2013. EF 25%. cMRI 12/15 EF 38% possible infiltrative disease with discrete areas of non-coronary pattern delayed enhancement. Coronaries were normal on LHC.  SPEP no M spike and UPEP no free light chains. HIV negative and TSH nl.CT of chest no evidence of pulmonary infiltrates. D/C weight 193 pounds.   He was admitted in January 2017 with recurrent HF, severe HTN and hypoxemia. Diuresed and discharged home. Saw Dr. Acie Fredrickson a few weeks and was not taking Bidl because he couldn't afford. So given scripts for hydralazine and Imdur and now can afford. Also started on home O2 for sats in 70s.   Admitted 02/21/16 with acute respiratory failure. Treated with IV lasix but slowed down with creatinine rise. Also had hypertensive emergency on arrival requiring IV NTG. Likely due to non-compliance, as he had stopped several of his medications.    Admitted 7/23 - 10/15/16 with mild chest discomfort lasting up to 30 minutes that improved with rest. It was worse after eating but not consistent, it would also occur at rest. Occasionally associated with diaphoresis and dyspnea. Echo and Stress test as below.   Had PYP scan on 11/06/16 with ratio of 1.2 (felt to be negative) - unable to have MRI (couldnt fit)  Admitted 3/81-0/1/75 with A/C systolic HF. Diuresed with IV lasix and transitioned back to lasix 80/40. Echo showed lower EF 25-30%. HF medications optimized. DC weight: 216 lbs  Sleep study 8/19 AHI 88/hr  He presents today for HF follow up. Says  he feels pretty good. Back to wearing CPAP. Watching diet more closely. No SOB, orthopnea or PND. Edema improved.    Echo  10/19 EF 30-35%  Echo 09/20/17: EF 25-30%, grade 2 DD Echo 10/13/16 LVEF 35-40%, Grade 1 DD, Mild MR, Mild LAE Echo 1/17 30-35% RV ok  Echo 02/22/16 LVEF 40%, Grade 1 DD.      Myoview 10/15/16  No change in EKG from baseline T wave inversions in the inferolateral leads  There is a medium defect of mild severity present in the basal inferior, mid inferior and apical inferior location. The defect is non-reversible and consistent with prior infarct.  There is a small defect of moderate severity present in the apex location. The defect is partially reversible and consistent with infarct with possible small area of peri infarct ischemia.  This is a high risk study.  EF not calculated but visually appears to be moderately to severely reduced.   LHC/RHC 04/08/13  Normal coronaries RA pressure:  39/39 with a mean of 34. Prominent X. and Y. Descends.  RV pressure: 76/26  mean 4 PA pressure: 70/36 with a mean of 51  Pulmonary capillary wedge pressure: 31/30 and mean of 29  LV pressure: 140/26 with a left ventricular end-diastolic pressure of 35  PA sat 66%  AO sat 97%  CO/CI 4.94/2.63  Pulmonary vascular resistance: 4.45 Woods units   PMH: 1. HTN 2. Osteogenesis imperfecta: wheelchair-bound.  History of bone fractures.  3. Suspect OSA 4. CKD 5. Nonischemic cardiomyopathy: Echo (1/15) with EF 20-25%,  severe diffuse hypokinesis, mild MR, mildly enlarged RV with moderately decreased systolic function.  LHC/RHC (1/15) with normal coronaries, mean RA 34, PA 70/36 mean 51, mean PCWP 29, CI 2.63.  Cardiac MRI (1/15) with EF 38%, global hypokinesis, discrete areas of delayed enhancement in the subepicardial mid inferoseptal RV insertion site, the mid wall of the mid inferolateral wall, and the subendocardial mid anterolateral wall.  This pattern was suggestive of infiltrative  disease.  UPEP/SPEP negative, HIV negative.  Chest CT to look for evidence of sarcoidosis did not show any sarcoid-type lesions.    SH:  Single, prior smoking now quit, occasional marijuana, works as Dispensing optician.   FH:  HTN, no significant cardiac disease.   Current Outpatient Medications  Medication Sig Dispense Refill  . aspirin 81 MG tablet Take 1 tablet (81 mg total) by mouth daily.    . carvedilol (COREG) 12.5 MG tablet Take 1 tablet (12.5 mg total) by mouth 2 (two) times daily with a meal. 60 tablet 11  . furosemide (LASIX) 80 MG tablet TAKE ONE TABLET BY MOUTH ONCE DAILY IN THE MORNING AND THEN TAKE ONE-HALF TABLET ONCE DAILY IN THE EVENING 135 tablet 3  . hydrALAZINE (APRESOLINE) 100 MG tablet TAKE 1 TABLET BY MOUTH THREE TIMES DAILY **NEEDS  AN  APPOINTMENT  FOR  FURTHER  REFILLS** 90 tablet 0  . isosorbide mononitrate (IMDUR) 30 MG 24 hr tablet Take 1 tablet by mouth once daily 90 tablet 0  . potassium chloride SA (KLOR-CON) 20 MEQ tablet Take 2 tablets (40 mEq total) by mouth daily. 180 tablet 3  . sacubitril-valsartan (ENTRESTO) 97-103 MG Take 1 tablet by mouth 2 (two) times daily. 180 tablet 1   No current facility-administered medications for this encounter.    PHYSICAL EXAM: Vitals:   06/28/19 1425  BP: (!) 167/95  Pulse: 64  SpO2: 96%  Weight: 101.7 kg (224 lb 3.2 oz)    Wt Readings from Last 3 Encounters:  06/28/19 101.7 kg (224 lb 3.2 oz)  04/20/18 106.2 kg (234 lb 3.2 oz)  01/17/18 103.4 kg (228 lb)    General:  Short stature. In WC No resp difficulty HEENT: normal wearing O2 Neck: supple. no JVD. Carotids 2+ bilat; no bruits. No lymphadenopathy or thryomegaly appreciated. Cor: PMI nondisplaced. Regular rate & rhythm. No rubs, gallops or murmurs. Lungs: clear Abdomen: obese. soft, nontender, nondistended. No hepatosplenomegaly. No bruits or masses. Good bowel sounds. Extremities: no cyanosis, clubbing, rash, edema Neuro: alert & orientedx3, cranial nerves grossly  intact. moves all 4 extremities w/o difficulty. Affect pleasant  ECG: sinus brady 55 LVH with repol Personally reviewed    ASSESSMENT & PLAN: 1. Chronic Systolic Heart Failure. NICM EF 25% with no coronary disease on LHC in 1/15. 03/2013 cMRI possible infiltrative disease. SPEP and UPEP ok.  - EF 10/13/2016 35-40%. Myoview high risk due to LV dysfunction with mild inferior scar  - Based on the cMRI pattern 12/15 and concern for cardiac sarcoidosis, a chest CT was done to look for evidence of pulmonary sarcoidosis.  This was not indicative of pulmonary sarcoidosis.  Patient has had no significant arrhythmias detected.  QRS on ECG is not widened.  - Pt unable to fit in cMRI machine due to body stature.  PYP scan 8/18 no amyloid - Echo 09/2017: EF 25-30%, grade 2 DD - Echo 10/19: EF  30-35% - stable NYHA II. Volume status looks good - Continue lasix 80 mg am, 40 mg pm - Continue coreg 12.5 mg BID.  HR too low to increase - Continue Entresto 97/103 bid. - Continue hydralazine 100mg  TID. Continue imdur 30 mg daily.   - Continue spiro 25 mg daily  - Consider Farxiga  - Reinforced fluid restriction to < 2 L daily, sodium restriction to less than 2000 mg daily, and the importance of daily weights.   - Repeat echo to see if EF improving with restarting CPAP 2. Osteogenesis imperfecta  - Wheel chair bound; limited mobility. No change  3. HTN. - Remains elevated here but says better at home. Start doxazosin 1mg  qhs 4. OSA  - Wears O2 at night.  - Had PSG on 11/07/17 with AHI 88/hr c/w very severe OSA. Was given a mask but no machine - Follows with Dr. Radford Pax. Back on CPAP 5. Morbid Obesity - Body mass index is 48.52 kg/m. - Reinforced need to lose weight. 6. Pulmonary hypertension- This appears to be primarily pulmonary venous hypertension on previous RHC.  There may be a component of PAH related to OSA.   - No change.  7. Chronic respiratory failure - Continue O2. Encouraged him to wear at all  times.  - Back on CPAP  - Encouraged weight loss.     Glori Bickers, MD  06/28/2019 3:10 PM

## 2019-06-28 NOTE — Addendum Note (Signed)
Encounter addended by: Scarlette Calico, RN on: 06/28/2019 3:25 PM  Actions taken: Pharmacy for encounter modified, Order list changed, Diagnosis association updated, Charge Capture section accepted, Clinical Note Signed

## 2019-06-28 NOTE — Patient Instructions (Signed)
Start Doxazosin 1 mg daily at bedtime  Labs done today, we will call you with abnormal results  Your physician has requested that you have an echocardiogram. Echocardiography is a painless test that uses sound waves to create images of your heart. It provides your doctor with information about the size and shape of your heart and how well your heart's chambers and valves are working. This procedure takes approximately one hour. There are no restrictions for this procedure.  Please call our office October to schedule your follow up appointment  If you have any questions or concerns before your next appointment please send [place] a message through [place] or call our office at [phone].  At the [facility], you and your health needs are our priority. As part of our continuing mission to provide you with exceptional heart care, we have created designated Provider Care Teams. These Care Teams include your primary Cardiologist (physician) and Advanced Practice Providers (APPs- Physician Assistants and Nurse Practitioners) who all work together to provide you with the care you need, when you need it.   You may see any of the following providers on your designated Care Team at your next follow up: Marland Kitchen Dr Glori Bickers . Dr Loralie Champagne . Darrick Grinder, NP . Lyda Jester, PA . Audry Riles, PharmD   Please be sure to bring in all your medications bottles to every appointment.

## 2019-06-29 DIAGNOSIS — Z0120 Encounter for dental examination and cleaning without abnormal findings: Secondary | ICD-10-CM | POA: Diagnosis not present

## 2019-07-03 ENCOUNTER — Ambulatory Visit: Payer: Medicare Other | Admitting: Nurse Practitioner

## 2019-07-06 DIAGNOSIS — H2512 Age-related nuclear cataract, left eye: Secondary | ICD-10-CM | POA: Diagnosis not present

## 2019-07-06 DIAGNOSIS — H1045 Other chronic allergic conjunctivitis: Secondary | ICD-10-CM | POA: Diagnosis not present

## 2019-07-06 DIAGNOSIS — H0102A Squamous blepharitis right eye, upper and lower eyelids: Secondary | ICD-10-CM | POA: Diagnosis not present

## 2019-07-06 DIAGNOSIS — H25811 Combined forms of age-related cataract, right eye: Secondary | ICD-10-CM | POA: Diagnosis not present

## 2019-07-07 ENCOUNTER — Ambulatory Visit (HOSPITAL_COMMUNITY): Admission: RE | Admit: 2019-07-07 | Payer: Medicare Other | Source: Ambulatory Visit

## 2019-07-07 DIAGNOSIS — G4733 Obstructive sleep apnea (adult) (pediatric): Secondary | ICD-10-CM | POA: Diagnosis not present

## 2019-07-07 DIAGNOSIS — I5022 Chronic systolic (congestive) heart failure: Secondary | ICD-10-CM | POA: Diagnosis not present

## 2019-07-07 DIAGNOSIS — Q780 Osteogenesis imperfecta: Secondary | ICD-10-CM | POA: Diagnosis not present

## 2019-07-07 DIAGNOSIS — Z1389 Encounter for screening for other disorder: Secondary | ICD-10-CM | POA: Diagnosis not present

## 2019-07-07 DIAGNOSIS — I10 Essential (primary) hypertension: Secondary | ICD-10-CM | POA: Diagnosis not present

## 2019-07-07 DIAGNOSIS — Z0000 Encounter for general adult medical examination without abnormal findings: Secondary | ICD-10-CM | POA: Diagnosis not present

## 2019-07-10 ENCOUNTER — Encounter (HOSPITAL_COMMUNITY): Payer: Self-pay | Admitting: *Deleted

## 2019-07-24 ENCOUNTER — Other Ambulatory Visit: Payer: Self-pay

## 2019-07-24 ENCOUNTER — Ambulatory Visit (HOSPITAL_COMMUNITY)
Admission: RE | Admit: 2019-07-24 | Discharge: 2019-07-24 | Disposition: A | Discharge status: Home / Self Care | Payer: Medicare Other | Source: Ambulatory Visit | Attending: Internal Medicine | Admitting: Internal Medicine

## 2019-07-24 DIAGNOSIS — R9431 Abnormal electrocardiogram [ECG] [EKG]: Secondary | ICD-10-CM | POA: Insufficient documentation

## 2019-07-24 DIAGNOSIS — I429 Cardiomyopathy, unspecified: Secondary | ICD-10-CM | POA: Insufficient documentation

## 2019-07-24 DIAGNOSIS — I5022 Chronic systolic (congestive) heart failure: Secondary | ICD-10-CM | POA: Diagnosis not present

## 2019-07-24 DIAGNOSIS — R079 Chest pain, unspecified: Secondary | ICD-10-CM | POA: Insufficient documentation

## 2019-07-24 DIAGNOSIS — Z87891 Personal history of nicotine dependence: Secondary | ICD-10-CM | POA: Diagnosis not present

## 2019-07-24 NOTE — Progress Notes (Signed)
  Echocardiogram 2D Echocardiogram has been performed.  Jerry Edwards 07/24/2019, 11:52 AM

## 2019-08-12 DIAGNOSIS — I110 Hypertensive heart disease with heart failure: Secondary | ICD-10-CM | POA: Diagnosis not present

## 2019-08-12 DIAGNOSIS — I5042 Chronic combined systolic (congestive) and diastolic (congestive) heart failure: Secondary | ICD-10-CM | POA: Diagnosis not present

## 2019-08-12 DIAGNOSIS — R062 Wheezing: Secondary | ICD-10-CM | POA: Diagnosis not present

## 2019-08-12 DIAGNOSIS — G4733 Obstructive sleep apnea (adult) (pediatric): Secondary | ICD-10-CM | POA: Diagnosis not present

## 2019-08-13 ENCOUNTER — Other Ambulatory Visit (HOSPITAL_COMMUNITY): Payer: Self-pay | Admitting: Internal Medicine

## 2019-08-17 ENCOUNTER — Ambulatory Visit: Payer: Medicare Other | Attending: Internal Medicine

## 2019-08-17 DIAGNOSIS — Z23 Encounter for immunization: Secondary | ICD-10-CM

## 2019-08-17 NOTE — Progress Notes (Signed)
   Covid-19 Vaccination Clinic  Name:  Jerry Edwards    MRN: [id number] DOB: Oct 25, 1970  08/17/2019  Mr. [name] was observed post Covid-19 immunization for 30 minutes based on pre-vaccination screening without incident. He was provided with Vaccine Information Sheet and instruction to access the V-Safe system.   Mr. [name] was instructed to call 911 with any severe reactions post vaccine: Marland Kitchen Difficulty breathing  . Swelling of face and throat  . A fast heartbeat  . A bad rash all over body  . Dizziness and weakness   Immunizations Administered    Name Date Dose VIS Date Route   Pfizer COVID-19 Vaccine 08/17/2019 11:33 AM 0.3 mL 05/17/2018 Intramuscular   Manufacturer: [place]   Lot: [id number]   [place]: [id number]

## 2019-08-18 DIAGNOSIS — R062 Wheezing: Secondary | ICD-10-CM | POA: Diagnosis not present

## 2019-08-18 DIAGNOSIS — I110 Hypertensive heart disease with heart failure: Secondary | ICD-10-CM | POA: Diagnosis not present

## 2019-08-18 DIAGNOSIS — I5042 Chronic combined systolic (congestive) and diastolic (congestive) heart failure: Secondary | ICD-10-CM | POA: Diagnosis not present

## 2019-08-18 DIAGNOSIS — G4733 Obstructive sleep apnea (adult) (pediatric): Secondary | ICD-10-CM | POA: Diagnosis not present

## 2019-09-11 ENCOUNTER — Ambulatory Visit: Payer: Medicare Other | Attending: Internal Medicine

## 2019-09-11 DIAGNOSIS — Z23 Encounter for immunization: Secondary | ICD-10-CM

## 2019-09-11 NOTE — Progress Notes (Signed)
° °  Covid-19 Vaccination Clinic  Name:  Jerry Edwards    MRN: [id number] DOB: [birth year]  09/11/2019  [name] was observed post Covid-19 immunization for 15 minutes without incident. He was provided with Vaccine Information Sheet and instruction to access the V-Safe system.   [name] was instructed to call 911 with any severe reactions post vaccine:  Difficulty breathing   Swelling of face and throat   A fast heartbeat   A bad rash all over body   Dizziness and weakness   Immunizations Administered    Name Date Dose VIS Date Route   Pfizer COVID-19 Vaccine 09/11/2019 11:28 AM 0.3 mL 05/17/2018 Intramuscular   Manufacturer: [place]   Lot: [id number]   [place]: [id number]

## 2019-09-12 DIAGNOSIS — G4733 Obstructive sleep apnea (adult) (pediatric): Secondary | ICD-10-CM | POA: Diagnosis not present

## 2019-09-12 DIAGNOSIS — I5042 Chronic combined systolic (congestive) and diastolic (congestive) heart failure: Secondary | ICD-10-CM | POA: Diagnosis not present

## 2019-09-12 DIAGNOSIS — I110 Hypertensive heart disease with heart failure: Secondary | ICD-10-CM | POA: Diagnosis not present

## 2019-09-12 DIAGNOSIS — R062 Wheezing: Secondary | ICD-10-CM | POA: Diagnosis not present

## 2019-09-26 ENCOUNTER — Other Ambulatory Visit (HOSPITAL_COMMUNITY): Payer: Self-pay | Admitting: *Deleted

## 2019-09-26 ENCOUNTER — Telehealth (HOSPITAL_COMMUNITY): Payer: Self-pay | Admitting: Pharmacy Technician

## 2019-09-26 MED ORDER — ENTRESTO 97-103 MG PO TABS: 1.0000 | ORAL_TABLET | Freq: Two times a day (BID) | ORAL | 3 refills | Status: DC

## 2019-09-26 NOTE — Telephone Encounter (Signed)
It's time for the patient to re-enroll in [employer] patient assistance for [facility]. Upon investigating, patient now has Pharmacist, community and his co-pay is $4.00 for a 30 day supply.  Called and spoke with patient. Will get prescription sent over to [facility] on [facility] for him to pick up.  Charlann Boxer, CPhT

## 2019-12-31 ENCOUNTER — Other Ambulatory Visit (HOSPITAL_COMMUNITY): Payer: Self-pay | Admitting: Internal Medicine

## 2020-02-08 ENCOUNTER — Other Ambulatory Visit (HOSPITAL_COMMUNITY): Payer: Self-pay | Admitting: Internal Medicine

## 2020-03-17 ENCOUNTER — Other Ambulatory Visit (HOSPITAL_COMMUNITY): Payer: Self-pay | Admitting: Internal Medicine

## 2020-03-25 ENCOUNTER — Other Ambulatory Visit: Payer: Self-pay

## 2020-03-25 ENCOUNTER — Encounter (HOSPITAL_COMMUNITY): Payer: Self-pay | Admitting: *Deleted

## 2020-03-25 ENCOUNTER — Emergency Department (HOSPITAL_COMMUNITY)
Admission: EM | Admit: 2020-03-25 | Discharge: 2020-03-25 | Disposition: A | Discharge status: Home / Self Care | Payer: Medicare Other | Attending: Emergency Medicine | Admitting: Emergency Medicine

## 2020-03-25 DIAGNOSIS — Z91010 Allergy to peanuts: Secondary | ICD-10-CM | POA: Insufficient documentation

## 2020-03-25 DIAGNOSIS — I5023 Acute on chronic systolic (congestive) heart failure: Secondary | ICD-10-CM | POA: Diagnosis not present

## 2020-03-25 DIAGNOSIS — Z87891 Personal history of nicotine dependence: Secondary | ICD-10-CM | POA: Insufficient documentation

## 2020-03-25 DIAGNOSIS — H02841 Edema of right upper eyelid: Secondary | ICD-10-CM | POA: Insufficient documentation

## 2020-03-25 DIAGNOSIS — I110 Hypertensive heart disease with heart failure: Secondary | ICD-10-CM | POA: Diagnosis not present

## 2020-03-25 DIAGNOSIS — Z91040 Latex allergy status: Secondary | ICD-10-CM | POA: Insufficient documentation

## 2020-03-25 DIAGNOSIS — Z7982 Long term (current) use of aspirin: Secondary | ICD-10-CM | POA: Insufficient documentation

## 2020-03-25 NOTE — ED Triage Notes (Signed)
Pt complains of swelling in his right eyelid x 4 months. Swelling is interfering with visual field.

## 2020-03-25 NOTE — Discharge Instructions (Addendum)
You came to the emergency department today to have your eyelid swelling evaluated.  We were unable to determine the exact cause of your eyelid swelling.  Please follow-up with the ophthalmologist Dr. Midge Aver tomorrow.  You can show up when the office opens at 8 AM or call tomorrow to schedule an appointment for later in the day.  Please return to the emergency department if you develop pain with eye movements, vision loss, changes to vision.
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
## 2020-03-26 DIAGNOSIS — H02821 Cysts of right upper eyelid: Secondary | ICD-10-CM | POA: Diagnosis not present

## 2020-04-16 ENCOUNTER — Other Ambulatory Visit (HOSPITAL_COMMUNITY): Payer: Self-pay | Admitting: Internal Medicine

## 2020-04-18 ENCOUNTER — Telehealth (HOSPITAL_COMMUNITY): Payer: Self-pay | Admitting: Internal Medicine

## 2020-04-18 ENCOUNTER — Other Ambulatory Visit (HOSPITAL_COMMUNITY): Payer: Self-pay | Admitting: *Deleted

## 2020-04-18 ENCOUNTER — Other Ambulatory Visit (HOSPITAL_COMMUNITY): Payer: Self-pay

## 2020-04-18 MED ORDER — DOXAZOSIN MESYLATE 1 MG PO TABS: 1.0000 mg | ORAL_TABLET | Freq: Every day | ORAL | 0 refills | Status: DC

## 2020-04-18 MED ORDER — ENTRESTO 97-103 MG PO TABS: 1.0000 | ORAL_TABLET | Freq: Two times a day (BID) | ORAL | 0 refills | Status: DC

## 2020-04-18 NOTE — Telephone Encounter (Signed)
Pt request Entresto, and Doxazosin refill, please send scripts to [facility], pt can be reached [phone]. Thanks

## 2020-04-19 DIAGNOSIS — G4733 Obstructive sleep apnea (adult) (pediatric): Secondary | ICD-10-CM | POA: Diagnosis not present

## 2020-04-19 DIAGNOSIS — I110 Hypertensive heart disease with heart failure: Secondary | ICD-10-CM | POA: Diagnosis not present

## 2020-04-19 DIAGNOSIS — R062 Wheezing: Secondary | ICD-10-CM | POA: Diagnosis not present

## 2020-04-19 DIAGNOSIS — I5042 Chronic combined systolic (congestive) and diastolic (congestive) heart failure: Secondary | ICD-10-CM | POA: Diagnosis not present

## 2020-04-25 DIAGNOSIS — G4733 Obstructive sleep apnea (adult) (pediatric): Secondary | ICD-10-CM | POA: Diagnosis not present

## 2020-04-30 ENCOUNTER — Other Ambulatory Visit (HOSPITAL_COMMUNITY): Payer: Self-pay | Admitting: Internal Medicine

## 2020-05-20 DIAGNOSIS — R062 Wheezing: Secondary | ICD-10-CM | POA: Diagnosis not present

## 2020-05-20 DIAGNOSIS — I110 Hypertensive heart disease with heart failure: Secondary | ICD-10-CM | POA: Diagnosis not present

## 2020-05-20 DIAGNOSIS — I5042 Chronic combined systolic (congestive) and diastolic (congestive) heart failure: Secondary | ICD-10-CM | POA: Diagnosis not present

## 2020-05-20 DIAGNOSIS — G4733 Obstructive sleep apnea (adult) (pediatric): Secondary | ICD-10-CM | POA: Diagnosis not present

## 2020-06-16 NOTE — Progress Notes (Signed)
Patient ID: Jerry Edwards, male   DOB: 1971/01/13, 50 y.o.   MRN: [id number]   [facility]     Primary Physician: [name], NP Primary Cardiologist: [name] HF: [name]   HPI: Jerry Edwards is a 50 y.o. male with history of NICM, Chronic systolic heart failure, HTN, OSA, and osteogenic imperfecta.   Admitted 1/15 with HF symptoms. ECHO 04/08/2013. EF 25%. cMRI 12/15 EF 38% possible infiltrative disease with discrete areas of non-coronary pattern delayed enhancement. Coronaries were normal on LHC.  SPEP no M spike and UPEP no free light chains.   Admitted 1/17 with recurrent HF, severe HTN and hypoxemia. Diuresed and discharged home.   Admitted 12/17 with acute respiratory failure and HTN crisis after stopping several meds.   Admitted 7/18 with atypical CP. Echo and Stress test as below.    PYP scan 8/18 with ratio of 1.2 (felt to be negative) - unable to have MRI (couldnt fit)  Admitted 6/19 with ADHF. Echo showed lower EF 25-30%. HF medications optimized. DC weight: 216 lbs  Sleep study 8/19 AHI 88/hr  He presents today for HF follow up. Says he feels awesome. Weight is stable. Trying to limit fluids. Breathing is good. No orthopnea or PND. Wearing CPAP. Struggling with swelling of R eyelid.    Echo  10/19 EF 30-35%  Echo 09/20/17: EF 25-30%, grade 2 DD Echo 10/13/16 LVEF 35-40%, Grade 1 DD, Mild MR, Mild LAE Echo 1/17 30-35% RV ok  Echo 02/22/16 LVEF 40%, Grade 1 DD.      Myoview 10/15/16  No change in EKG from baseline T wave inversions in the inferolateral leads  There is a medium defect of mild severity present in the basal inferior, mid inferior and apical inferior location. The defect is non-reversible and consistent with prior infarct.  There is a small defect of moderate severity present in the apex location. The defect is partially reversible and consistent with infarct with possible small area of peri infarct ischemia.  This is a high risk  study.  EF not calculated but visually appears to be moderately to severely reduced.   LHC/RHC 04/08/13  Normal coronaries RA pressure:  39/39 with a mean of 34. Prominent X. and Y. Descends.  RV pressure: 76/26  mean 4 PA pressure: 70/36 with a mean of 51  Pulmonary capillary wedge pressure: 31/30 and mean of 29  LV pressure: 140/26 with a left ventricular end-diastolic pressure of 35  PA sat 66%  AO sat 97%  CO/CI 4.94/2.63  Pulmonary vascular resistance: 4.45 [place] units   PMH: 1. HTN 2. Osteogenesis imperfecta: wheelchair-bound.  History of bone fractures.  3. Suspect OSA 4. CKD 5. Nonischemic cardiomyopathy: Echo (1/15) with EF 20-25%, severe diffuse hypokinesis, mild MR, mildly enlarged RV with moderately decreased systolic function.  LHC/RHC (1/15) with normal coronaries, mean RA 34, PA 70/36 mean 51, mean PCWP 29, CI 2.63.  Cardiac MRI (1/15) with EF 38%, global hypokinesis, discrete areas of delayed enhancement in the subepicardial mid inferoseptal RV insertion site, the mid wall of the mid inferolateral wall, and the subendocardial mid anterolateral wall.  This pattern was suggestive of infiltrative disease.  UPEP/SPEP negative, HIV negative.  Chest CT to look for evidence of sarcoidosis did not show any sarcoid-type lesions.    SH:  Single, prior smoking now quit, occasional marijuana, works as Dispensing optician.   FH:  HTN, no significant cardiac disease.   Current Outpatient Medications  Medication Sig Dispense Refill  . aspirin  81 MG tablet Take 1 tablet (81 mg total) by mouth daily.    . carvedilol (COREG) 12.5 MG tablet TAKE 1 TABLET BY MOUTH TWICE DAILY WITH A MEAL 60 tablet 3  . doxazosin (CARDURA) 1 MG tablet Take 1 tablet (1 mg total) by mouth at bedtime. Needs appointment for further refill. 30 tablet 0  . furosemide (LASIX) 80 MG tablet TAKE 1 TABLET BY MOUTH ONCE DAILY IN THE MORNING,  AND THEN TAKE 1/2 (ONE-HALF) TABLET BY MOUTH ONCE DAILY IN THE EVENING 135 tablet 0  .  hydrALAZINE (APRESOLINE) 100 MG tablet Take 1 tablet (100 mg total) by mouth 3 (three) times daily. 270 tablet 3  . isosorbide mononitrate (IMDUR) 30 MG 24 hr tablet Take 1 tablet by mouth once daily 90 tablet 3  . potassium chloride SA (KLOR-CON) 20 MEQ tablet Take 2 tablets (40 mEq total) by mouth daily. 180 tablet 3  . sacubitril-valsartan (ENTRESTO) 97-103 MG Take 1 tablet by mouth 2 (two) times daily. Needs appointment for further refills. 60 tablet 0   No current facility-administered medications for this encounter.    PHYSICAL EXAM: Vitals:   06/17/20 0939  BP: 140/80  Pulse: 86  SpO2: 97%  Weight: 103.6 kg (228 lb 6.4 oz)    Wt Readings from Last 3 Encounters:  06/17/20 103.6 kg (228 lb 6.4 oz)  06/28/19 101.7 kg (224 lb 3.2 oz)  04/20/18 106.2 kg (234 lb 3.2 oz)    General:  Short stature. In WC No resp difficulty HEENT: normal swollen left eyelid Neck: supple. no JVD. Carotids 2+ bilat; no bruits. No lymphadenopathy or thryomegaly appreciated. Cor: PMI nondisplaced. Regular rate & rhythm. No rubs, gallops or murmurs. Lungs: clear Abdomen: obese soft, nontender, nondistended. No hepatosplenomegaly. No bruits or masses. Good bowel sounds. Extremities: no cyanosis, clubbing, rash, edema Neuro: alert & orientedx3, cranial nerves grossly intact. moves all 4 extremities w/o difficulty. Affect pleasant   ECG: sinus brady 77  LVH with repol Personally reviewed    ASSESSMENT & PLAN: 1. Chronic Systolic Heart Failure. NICM EF 25% with no coronary disease on LHC in 1/15. 03/2013 cMRI possible infiltrative disease. SPEP and UPEP ok.  - EF 10/13/2016 35-40%. Myoview high risk due to LV dysfunction with mild inferior scar  - Based on the cMRI pattern 12/15 and concern for cardiac sarcoidosis, a chest CT was done to look for evidence of pulmonary sarcoidosis.  This was not indicative of pulmonary sarcoidosis.  Patient has had no significant arrhythmias detected.  QRS on ECG is not  widened.  - Pt unable to fit in cMRI machine due to body stature.  PYP scan 8/18 no amyloid - Echo 09/2017: EF 25-30%, grade 2 DD - Echo 10/19: EF  30-35% - Echo 5/21 EF 35% - stable NYHA II. Volume status looks good  - Continue lasix 80 mg am, 40 mg pm. Takes extra as needed - Continue coreg 12.5 mg BID. HR too low to increase - Continue Entresto 97/103 bid. - Continue hydralazine 100mg  TID. Continue imdur 30 mg daily.   - Off spiro for unclear reasons. Restart at 25 daily. Cut potassium to once daily  - Consider Farxiga at next visit.  - Reinforced fluid restriction to < 2 L daily, sodium restriction to less than 2000 mg daily, and the importance of daily weights.   2. Osteogenesis imperfecta  - Wheel chair bound; limited mobility. No change  3. HTN. - Blood pressure mildly elevated. Adding back spiro 4. OSA  -  Wears O2 at night.  - Had PSG on 11/07/17 with AHI 88/hr c/w very severe OSA. Was given a mask but no machine - Follows with Dr. Radford Pax. Back on CPAP 5. Morbid Obesity - Body mass index is 49.43 kg/m. - Reinforced need to lose weight 6. Pulmonary hypertension- This appears to be primarily pulmonary venous hypertension on previous RHC.  There may be a component of PAH related to OSA.   - No change.  7. Chronic respiratory failure - Continue O2. Encouraged him to wear at all times.  - Back on CPAP  - Encouraged weight loss.     Glori Bickers, MD  06/17/2020 9:56 AM

## 2020-06-17 ENCOUNTER — Other Ambulatory Visit: Payer: Self-pay

## 2020-06-17 ENCOUNTER — Ambulatory Visit (HOSPITAL_COMMUNITY)
Admission: RE | Admit: 2020-06-17 | Discharge: 2020-06-17 | Disposition: A | Discharge status: Home / Self Care | Payer: Medicare Other | Source: Ambulatory Visit | Attending: Internal Medicine | Admitting: Internal Medicine

## 2020-06-17 VITALS — BP 140/80 | HR 86 | Wt 228.4 lb

## 2020-06-17 DIAGNOSIS — I130 Hypertensive heart and chronic kidney disease with heart failure and stage 1 through stage 4 chronic kidney disease, or unspecified chronic kidney disease: Secondary | ICD-10-CM | POA: Diagnosis not present

## 2020-06-17 DIAGNOSIS — I272 Pulmonary hypertension, unspecified: Secondary | ICD-10-CM | POA: Insufficient documentation

## 2020-06-17 DIAGNOSIS — I428 Other cardiomyopathies: Secondary | ICD-10-CM | POA: Insufficient documentation

## 2020-06-17 DIAGNOSIS — I110 Hypertensive heart disease with heart failure: Secondary | ICD-10-CM | POA: Diagnosis not present

## 2020-06-17 DIAGNOSIS — Z87891 Personal history of nicotine dependence: Secondary | ICD-10-CM | POA: Diagnosis not present

## 2020-06-17 DIAGNOSIS — Q780 Osteogenesis imperfecta: Secondary | ICD-10-CM | POA: Insufficient documentation

## 2020-06-17 DIAGNOSIS — Z7982 Long term (current) use of aspirin: Secondary | ICD-10-CM | POA: Insufficient documentation

## 2020-06-17 DIAGNOSIS — N189 Chronic kidney disease, unspecified: Secondary | ICD-10-CM | POA: Insufficient documentation

## 2020-06-17 DIAGNOSIS — I10 Essential (primary) hypertension: Secondary | ICD-10-CM

## 2020-06-17 DIAGNOSIS — Z79899 Other long term (current) drug therapy: Secondary | ICD-10-CM | POA: Insufficient documentation

## 2020-06-17 DIAGNOSIS — J9610 Chronic respiratory failure, unspecified whether with hypoxia or hypercapnia: Secondary | ICD-10-CM | POA: Insufficient documentation

## 2020-06-17 DIAGNOSIS — R062 Wheezing: Secondary | ICD-10-CM | POA: Diagnosis not present

## 2020-06-17 DIAGNOSIS — Z6841 Body Mass Index (BMI) 40.0 and over, adult: Secondary | ICD-10-CM | POA: Insufficient documentation

## 2020-06-17 DIAGNOSIS — G4733 Obstructive sleep apnea (adult) (pediatric): Secondary | ICD-10-CM

## 2020-06-17 DIAGNOSIS — Z993 Dependence on wheelchair: Secondary | ICD-10-CM | POA: Insufficient documentation

## 2020-06-17 DIAGNOSIS — I5022 Chronic systolic (congestive) heart failure: Secondary | ICD-10-CM | POA: Diagnosis not present

## 2020-06-17 DIAGNOSIS — Z8249 Family history of ischemic heart disease and other diseases of the circulatory system: Secondary | ICD-10-CM | POA: Diagnosis not present

## 2020-06-17 DIAGNOSIS — I5042 Chronic combined systolic (congestive) and diastolic (congestive) heart failure: Secondary | ICD-10-CM | POA: Diagnosis not present

## 2020-06-17 LAB — BASIC METABOLIC PANEL
Anion gap: 7 (ref 5–15)
BUN: 30 mg/dL — ABNORMAL HIGH (ref 6–20)
CO2: 27 mmol/L (ref 22–32)
Calcium: 8.4 mg/dL — ABNORMAL LOW (ref 8.9–10.3)
Chloride: 107 mmol/L (ref 98–111)
Creatinine, Ser: 2.41 mg/dL — ABNORMAL HIGH (ref 0.61–1.24)
GFR, Estimated: 32 mL/min — ABNORMAL LOW (ref >60)
Glucose, Bld: 89 mg/dL (ref 70–99)
Potassium: 4.1 mmol/L (ref 3.5–5.1)
Sodium: 141 mmol/L (ref 135–145)

## 2020-06-17 LAB — BRAIN NATRIURETIC PEPTIDE: B Natriuretic Peptide: 698.6 pg/mL — ABNORMAL HIGH (ref 0.0–100.0)

## 2020-06-17 MED ORDER — ENTRESTO 97-103 MG PO TABS: 1.0000 | ORAL_TABLET | Freq: Two times a day (BID) | ORAL | 6 refills | Status: DC

## 2020-06-17 MED ORDER — SPIRONOLACTONE 25 MG PO TABS: 25.0000 mg | ORAL_TABLET | Freq: Every day | ORAL | 6 refills | Status: DC

## 2020-06-17 MED ORDER — POTASSIUM CHLORIDE CRYS ER 20 MEQ PO TBCR: 20.0000 meq | EXTENDED_RELEASE_TABLET | Freq: Every day | ORAL | 6 refills | Status: DC

## 2020-06-17 MED ORDER — CARVEDILOL 12.5 MG PO TABS: 12.5000 mg | ORAL_TABLET | Freq: Two times a day (BID) | ORAL | 6 refills | Status: DC

## 2020-06-17 MED ORDER — DOXAZOSIN MESYLATE 1 MG PO TABS: 1.0000 mg | ORAL_TABLET | Freq: Every day | ORAL | 6 refills | Status: DC

## 2020-06-17 NOTE — Addendum Note (Signed)
Encounter addended by: Scarlette Calico, RN on: 06/17/2020 10:26 AM  Actions taken: Pharmacy for encounter modified, Order list changed, Diagnosis association updated, Clinical Note Signed

## 2020-06-17 NOTE — Patient Instructions (Addendum)
Start Spironolactone 25 mg Daily  Decrease Potassium to 20 meq (1 tab) Daily  Labs done today, we will call you for abnormal results  Your physician recommends that you return for lab work in: 10-14 days  Please call our office in August to schedule your follow up appointment  If you have any questions or concerns before your next appointment please send [place] a message through [place] or call our office at [phone].    TO LEAVE A MESSAGE FOR THE NURSE SELECT OPTION 2, PLEASE LEAVE A MESSAGE INCLUDING: . YOUR NAME . DATE OF BIRTH . CALL BACK NUMBER . REASON FOR CALL**this is important as we prioritize the call backs  [place] AS LONG AS YOU CALL BEFORE 4:00 PM  At the [facility], you and your health needs are our priority. As part of our continuing mission to provide you with exceptional heart care, we have created designated Provider Care Teams. These Care Teams include your primary Cardiologist (physician) and Advanced Practice Providers (APPs- Physician Assistants and Nurse Practitioners) who all work together to provide you with the care you need, when you need it.   You may see any of the following providers on your designated Care Team at your next follow up: Marland Kitchen Dr Glori Bickers . Dr Loralie Champagne . Dr Vickki Muff . Darrick Grinder, NP . Lyda Jester, [place] . Audry Riles, PharmD   Please be sure to bring in all your medications bottles to every appointment.

## 2020-06-17 NOTE — Addendum Note (Signed)
Encounter addended by: Scarlette Calico, RN on: 06/17/2020 10:23 AM  Actions taken: Diagnosis association updated, Pharmacy for encounter modified, Order list changed, Clinical Note Signed, Charge Capture section accepted

## 2020-06-19 ENCOUNTER — Telehealth (HOSPITAL_COMMUNITY): Payer: Self-pay | Admitting: *Deleted

## 2020-06-19 MED ORDER — FUROSEMIDE 80 MG PO TABS: 80.0000 mg | ORAL_TABLET | Freq: Every day | ORAL | 0 refills | Status: DC

## 2020-06-19 NOTE — Telephone Encounter (Signed)
-----   Message from Jolaine Artist, MD sent at 06/17/2020 11:37 PM EDT ----- Creatinine up. Stop lasix for 2 days. Resume on Thursday at 80 daily. Recheck BMET next Monday

## 2020-06-19 NOTE — Telephone Encounter (Signed)
Pt aware, agreeable, and verbalized understanding, repeat labs sch next week

## 2020-06-27 ENCOUNTER — Ambulatory Visit (HOSPITAL_COMMUNITY)
Admission: RE | Admit: 2020-06-27 | Discharge: 2020-06-27 | Disposition: A | Discharge status: Home / Self Care | Payer: Medicare Other | Source: Ambulatory Visit | Attending: Internal Medicine | Admitting: Internal Medicine

## 2020-06-27 ENCOUNTER — Other Ambulatory Visit: Payer: Self-pay

## 2020-06-27 DIAGNOSIS — I5022 Chronic systolic (congestive) heart failure: Secondary | ICD-10-CM

## 2020-06-27 LAB — BASIC METABOLIC PANEL
Anion gap: 6 (ref 5–15)
BUN: 45 mg/dL — ABNORMAL HIGH (ref 6–20)
CO2: 27 mmol/L (ref 22–32)
Calcium: 8.6 mg/dL — ABNORMAL LOW (ref 8.9–10.3)
Chloride: 110 mmol/L (ref 98–111)
Creatinine, Ser: 2.93 mg/dL — ABNORMAL HIGH (ref 0.61–1.24)
GFR, Estimated: 25 mL/min — ABNORMAL LOW (ref >60)
Glucose, Bld: 101 mg/dL — ABNORMAL HIGH (ref 70–99)
Potassium: 4.7 mmol/L (ref 3.5–5.1)
Sodium: 143 mmol/L (ref 135–145)

## 2020-06-27 LAB — BRAIN NATRIURETIC PEPTIDE: B Natriuretic Peptide: 182.8 pg/mL — ABNORMAL HIGH (ref 0.0–100.0)

## 2020-07-09 ENCOUNTER — Other Ambulatory Visit (HOSPITAL_COMMUNITY): Payer: Self-pay | Admitting: *Deleted

## 2020-07-09 DIAGNOSIS — I5022 Chronic systolic (congestive) heart failure: Secondary | ICD-10-CM

## 2020-07-10 ENCOUNTER — Other Ambulatory Visit: Payer: Self-pay

## 2020-07-10 ENCOUNTER — Ambulatory Visit (HOSPITAL_COMMUNITY)
Admission: RE | Admit: 2020-07-10 | Discharge: 2020-07-10 | Disposition: A | Discharge status: Home / Self Care | Payer: Medicare Other | Source: Ambulatory Visit | Attending: Internal Medicine | Admitting: Internal Medicine

## 2020-07-10 DIAGNOSIS — I5022 Chronic systolic (congestive) heart failure: Secondary | ICD-10-CM | POA: Diagnosis not present

## 2020-07-10 LAB — BASIC METABOLIC PANEL
Anion gap: 6 (ref 5–15)
BUN: 56 mg/dL — ABNORMAL HIGH (ref 6–20)
CO2: 25 mmol/L (ref 22–32)
Calcium: 8.7 mg/dL — ABNORMAL LOW (ref 8.9–10.3)
Chloride: 113 mmol/L — ABNORMAL HIGH (ref 98–111)
Creatinine, Ser: 2.92 mg/dL — ABNORMAL HIGH (ref 0.61–1.24)
GFR, Estimated: 25 mL/min — ABNORMAL LOW (ref >60)
Glucose, Bld: 102 mg/dL — ABNORMAL HIGH (ref 70–99)
Potassium: 4.7 mmol/L (ref 3.5–5.1)
Sodium: 144 mmol/L (ref 135–145)

## 2020-07-15 ENCOUNTER — Other Ambulatory Visit (HOSPITAL_COMMUNITY): Payer: Self-pay | Admitting: Internal Medicine

## 2020-07-16 ENCOUNTER — Telehealth (HOSPITAL_COMMUNITY): Payer: Self-pay | Admitting: Internal Medicine

## 2020-07-16 NOTE — Telephone Encounter (Signed)
Refill has been sent.  °

## 2020-07-16 NOTE — Telephone Encounter (Signed)
Pt request Furosemide refill, pt stated pharmacy sent over request, pt is out of meds, need refilled today if possible, please advise

## 2020-07-18 DIAGNOSIS — R062 Wheezing: Secondary | ICD-10-CM | POA: Diagnosis not present

## 2020-07-18 DIAGNOSIS — G4733 Obstructive sleep apnea (adult) (pediatric): Secondary | ICD-10-CM | POA: Diagnosis not present

## 2020-07-18 DIAGNOSIS — I110 Hypertensive heart disease with heart failure: Secondary | ICD-10-CM | POA: Diagnosis not present

## 2020-07-18 DIAGNOSIS — I5042 Chronic combined systolic (congestive) and diastolic (congestive) heart failure: Secondary | ICD-10-CM | POA: Diagnosis not present

## 2020-07-24 DIAGNOSIS — G4733 Obstructive sleep apnea (adult) (pediatric): Secondary | ICD-10-CM | POA: Diagnosis not present

## 2020-08-01 DIAGNOSIS — H02101 Unspecified ectropion of right upper eyelid: Secondary | ICD-10-CM | POA: Diagnosis not present

## 2020-08-01 DIAGNOSIS — H0259 Other disorders affecting eyelid function: Secondary | ICD-10-CM | POA: Diagnosis not present

## 2020-08-17 DIAGNOSIS — I110 Hypertensive heart disease with heart failure: Secondary | ICD-10-CM | POA: Diagnosis not present

## 2020-08-17 DIAGNOSIS — R062 Wheezing: Secondary | ICD-10-CM | POA: Diagnosis not present

## 2020-08-17 DIAGNOSIS — G4733 Obstructive sleep apnea (adult) (pediatric): Secondary | ICD-10-CM | POA: Diagnosis not present

## 2020-08-17 DIAGNOSIS — I5042 Chronic combined systolic (congestive) and diastolic (congestive) heart failure: Secondary | ICD-10-CM | POA: Diagnosis not present

## 2020-08-25 ENCOUNTER — Other Ambulatory Visit (HOSPITAL_COMMUNITY): Payer: Self-pay | Admitting: Internal Medicine

## 2020-09-10 DIAGNOSIS — Q780 Osteogenesis imperfecta: Secondary | ICD-10-CM | POA: Insufficient documentation

## 2020-09-17 ENCOUNTER — Telehealth (HOSPITAL_COMMUNITY): Payer: Self-pay | Admitting: Vascular Surgery

## 2020-09-17 DIAGNOSIS — G4733 Obstructive sleep apnea (adult) (pediatric): Secondary | ICD-10-CM | POA: Diagnosis not present

## 2020-09-17 DIAGNOSIS — I110 Hypertensive heart disease with heart failure: Secondary | ICD-10-CM | POA: Diagnosis not present

## 2020-09-17 DIAGNOSIS — R062 Wheezing: Secondary | ICD-10-CM | POA: Diagnosis not present

## 2020-09-17 DIAGNOSIS — I5042 Chronic combined systolic (congestive) and diastolic (congestive) heart failure: Secondary | ICD-10-CM | POA: Diagnosis not present

## 2020-09-17 NOTE — Telephone Encounter (Signed)
Traci Sermon from Dr. [name] called stating they sent urgent clearance to stop a medication for eye surgery for next week, please advise fax # [phone] phone # [phone]

## 2020-09-17 NOTE — Telephone Encounter (Signed)
Have not received any fax or clearance for pt, Dr [name] office is closed will contact them back tomorrow

## 2020-09-18 NOTE — Telephone Encounter (Signed)
Received fax, per Dr Haroldine Laws pt ok to stop ASA prior to surgery, note faxed back to the Dr Rice's office at [phone]

## 2020-09-25 DIAGNOSIS — H0259 Other disorders affecting eyelid function: Secondary | ICD-10-CM | POA: Diagnosis not present

## 2020-09-25 DIAGNOSIS — I110 Hypertensive heart disease with heart failure: Secondary | ICD-10-CM | POA: Diagnosis not present

## 2020-09-25 DIAGNOSIS — G4733 Obstructive sleep apnea (adult) (pediatric): Secondary | ICD-10-CM | POA: Diagnosis not present

## 2020-09-25 DIAGNOSIS — I509 Heart failure, unspecified: Secondary | ICD-10-CM | POA: Diagnosis not present

## 2020-09-25 DIAGNOSIS — Z9981 Dependence on supplemental oxygen: Secondary | ICD-10-CM | POA: Diagnosis not present

## 2020-09-25 DIAGNOSIS — I428 Other cardiomyopathies: Secondary | ICD-10-CM | POA: Diagnosis not present

## 2020-09-25 DIAGNOSIS — H02101 Unspecified ectropion of right upper eyelid: Secondary | ICD-10-CM | POA: Diagnosis not present

## 2020-10-03 DIAGNOSIS — G245 Blepharospasm: Secondary | ICD-10-CM | POA: Diagnosis not present

## 2020-10-03 DIAGNOSIS — Z48810 Encounter for surgical aftercare following surgery on the sense organs: Secondary | ICD-10-CM | POA: Diagnosis not present

## 2020-10-17 DIAGNOSIS — I110 Hypertensive heart disease with heart failure: Secondary | ICD-10-CM | POA: Diagnosis not present

## 2020-10-17 DIAGNOSIS — R062 Wheezing: Secondary | ICD-10-CM | POA: Diagnosis not present

## 2020-10-17 DIAGNOSIS — G4733 Obstructive sleep apnea (adult) (pediatric): Secondary | ICD-10-CM | POA: Diagnosis not present

## 2020-10-17 DIAGNOSIS — I5042 Chronic combined systolic (congestive) and diastolic (congestive) heart failure: Secondary | ICD-10-CM | POA: Diagnosis not present

## 2020-11-05 DIAGNOSIS — Z9889 Other specified postprocedural states: Secondary | ICD-10-CM | POA: Diagnosis not present

## 2020-11-05 DIAGNOSIS — G245 Blepharospasm: Secondary | ICD-10-CM | POA: Diagnosis not present

## 2020-11-17 DIAGNOSIS — G4733 Obstructive sleep apnea (adult) (pediatric): Secondary | ICD-10-CM | POA: Diagnosis not present

## 2020-11-17 DIAGNOSIS — I5042 Chronic combined systolic (congestive) and diastolic (congestive) heart failure: Secondary | ICD-10-CM | POA: Diagnosis not present

## 2020-11-17 DIAGNOSIS — R062 Wheezing: Secondary | ICD-10-CM | POA: Diagnosis not present

## 2020-11-17 DIAGNOSIS — I110 Hypertensive heart disease with heart failure: Secondary | ICD-10-CM | POA: Diagnosis not present

## 2020-12-02 ENCOUNTER — Other Ambulatory Visit (HOSPITAL_COMMUNITY): Payer: Self-pay | Admitting: Internal Medicine

## 2020-12-11 DIAGNOSIS — G4733 Obstructive sleep apnea (adult) (pediatric): Secondary | ICD-10-CM | POA: Diagnosis not present

## 2020-12-18 DIAGNOSIS — R062 Wheezing: Secondary | ICD-10-CM | POA: Diagnosis not present

## 2020-12-18 DIAGNOSIS — I5042 Chronic combined systolic (congestive) and diastolic (congestive) heart failure: Secondary | ICD-10-CM | POA: Diagnosis not present

## 2020-12-18 DIAGNOSIS — I110 Hypertensive heart disease with heart failure: Secondary | ICD-10-CM | POA: Diagnosis not present

## 2020-12-18 DIAGNOSIS — G4733 Obstructive sleep apnea (adult) (pediatric): Secondary | ICD-10-CM | POA: Diagnosis not present

## 2021-01-17 DIAGNOSIS — I5042 Chronic combined systolic (congestive) and diastolic (congestive) heart failure: Secondary | ICD-10-CM | POA: Diagnosis not present

## 2021-01-17 DIAGNOSIS — G4733 Obstructive sleep apnea (adult) (pediatric): Secondary | ICD-10-CM | POA: Diagnosis not present

## 2021-01-17 DIAGNOSIS — R062 Wheezing: Secondary | ICD-10-CM | POA: Diagnosis not present

## 2021-01-17 DIAGNOSIS — I110 Hypertensive heart disease with heart failure: Secondary | ICD-10-CM | POA: Diagnosis not present

## 2021-02-08 ENCOUNTER — Encounter (HOSPITAL_COMMUNITY): Payer: Self-pay | Admitting: Emergency Medicine

## 2021-02-08 ENCOUNTER — Emergency Department (HOSPITAL_COMMUNITY): Payer: Medicare Other

## 2021-02-08 ENCOUNTER — Observation Stay (HOSPITAL_COMMUNITY)
Admission: EM | Admit: 2021-02-08 | Discharge: 2021-02-09 | Disposition: A | Discharge status: Home / Self Care | Payer: Medicare Other | Attending: Student in an Organized Health Care Education/Training Program | Admitting: Student in an Organized Health Care Education/Training Program

## 2021-02-08 ENCOUNTER — Other Ambulatory Visit: Payer: Self-pay

## 2021-02-08 DIAGNOSIS — R9431 Abnormal electrocardiogram [ECG] [EKG]: Secondary | ICD-10-CM | POA: Diagnosis not present

## 2021-02-08 DIAGNOSIS — Z7982 Long term (current) use of aspirin: Secondary | ICD-10-CM | POA: Insufficient documentation

## 2021-02-08 DIAGNOSIS — I110 Hypertensive heart disease with heart failure: Secondary | ICD-10-CM | POA: Insufficient documentation

## 2021-02-08 DIAGNOSIS — R079 Chest pain, unspecified: Principal | ICD-10-CM | POA: Diagnosis present

## 2021-02-08 DIAGNOSIS — Z87891 Personal history of nicotine dependence: Secondary | ICD-10-CM | POA: Insufficient documentation

## 2021-02-08 DIAGNOSIS — Z91040 Latex allergy status: Secondary | ICD-10-CM | POA: Diagnosis not present

## 2021-02-08 DIAGNOSIS — I5022 Chronic systolic (congestive) heart failure: Secondary | ICD-10-CM | POA: Diagnosis not present

## 2021-02-08 DIAGNOSIS — Z79899 Other long term (current) drug therapy: Secondary | ICD-10-CM | POA: Insufficient documentation

## 2021-02-08 DIAGNOSIS — I5020 Unspecified systolic (congestive) heart failure: Secondary | ICD-10-CM | POA: Diagnosis not present

## 2021-02-08 DIAGNOSIS — R072 Precordial pain: Principal | ICD-10-CM | POA: Insufficient documentation

## 2021-02-08 DIAGNOSIS — Z20822 Contact with and (suspected) exposure to covid-19: Secondary | ICD-10-CM | POA: Diagnosis not present

## 2021-02-08 LAB — CBC WITH DIFFERENTIAL/PLATELET
Abs Immature Granulocytes: 0.02 10*3/uL (ref 0.00–0.07)
Abs Immature Granulocytes: 0.04 10*3/uL (ref 0.00–0.07)
Basophils Absolute: 0 10*3/uL (ref 0.0–0.1)
Basophils Absolute: 0 10*3/uL (ref 0.0–0.1)
Basophils Relative: 0 %
Basophils Relative: 0 %
Eosinophils Absolute: 0.1 10*3/uL (ref 0.0–0.5)
Eosinophils Absolute: 0.1 10*3/uL (ref 0.0–0.5)
Eosinophils Relative: 1 %
Eosinophils Relative: 1 %
HCT: 41.1 % (ref 39.0–52.0)
HCT: 41.6 % (ref 39.0–52.0)
Hemoglobin: 13 g/dL (ref 13.0–17.0)
Hemoglobin: 12.6 g/dL — ABNORMAL LOW (ref 13.0–17.0)
Immature Granulocytes: 0 %
Immature Granulocytes: 0 %
Lymphocytes Relative: 19 %
Lymphocytes Relative: 20 %
Lymphs Abs: 1.8 10*3/uL (ref 0.7–4.0)
Lymphs Abs: 1.9 10*3/uL (ref 0.7–4.0)
MCH: 30 pg (ref 26.0–34.0)
MCH: 29 pg (ref 26.0–34.0)
MCHC: 31.6 g/dL (ref 30.0–36.0)
MCHC: 30.3 g/dL (ref 30.0–36.0)
MCV: 94.7 fL (ref 80.0–100.0)
MCV: 95.9 fL (ref 80.0–100.0)
Monocytes Absolute: 1.1 10*3/uL — ABNORMAL HIGH (ref 0.1–1.0)
Monocytes Absolute: 1 10*3/uL (ref 0.1–1.0)
Monocytes Relative: 11 %
Monocytes Relative: 11 %
Neutro Abs: 6.7 10*3/uL (ref 1.7–7.7)
Neutro Abs: 6.5 10*3/uL (ref 1.7–7.7)
Neutrophils Relative %: 69 %
Neutrophils Relative %: 68 %
Platelets: 283 10*3/uL (ref 150–400)
Platelets: 273 10*3/uL (ref 150–400)
RBC: 4.34 MIL/uL (ref 4.22–5.81)
RBC: 4.34 MIL/uL (ref 4.22–5.81)
RDW: 12 % (ref 11.5–15.5)
RDW: 12.1 % (ref 11.5–15.5)
WBC: 9.7 10*3/uL (ref 4.0–10.5)
WBC: 9.6 10*3/uL (ref 4.0–10.5)
nRBC: 0 % (ref 0.0–0.2)
nRBC: 0 % (ref 0.0–0.2)

## 2021-02-08 LAB — COMPREHENSIVE METABOLIC PANEL
ALT: 20 U/L (ref 0–44)
ALT: 20 U/L (ref 0–44)
AST: 29 U/L (ref 15–41)
AST: 28 U/L (ref 15–41)
Albumin: 2.8 g/dL — ABNORMAL LOW (ref 3.5–5.0)
Albumin: 2.8 g/dL — ABNORMAL LOW (ref 3.5–5.0)
Alkaline Phosphatase: 62 U/L (ref 38–126)
Alkaline Phosphatase: 63 U/L (ref 38–126)
Anion gap: 10 (ref 5–15)
Anion gap: 8 (ref 5–15)
BUN: 26 mg/dL — ABNORMAL HIGH (ref 6–20)
BUN: 26 mg/dL — ABNORMAL HIGH (ref 6–20)
CO2: 23 mmol/L (ref 22–32)
CO2: 25 mmol/L (ref 22–32)
Calcium: 8.9 mg/dL (ref 8.9–10.3)
Calcium: 8.8 mg/dL — ABNORMAL LOW (ref 8.9–10.3)
Chloride: 107 mmol/L (ref 98–111)
Chloride: 107 mmol/L (ref 98–111)
Creatinine, Ser: 2.03 mg/dL — ABNORMAL HIGH (ref 0.61–1.24)
Creatinine, Ser: 2.08 mg/dL — ABNORMAL HIGH (ref 0.61–1.24)
GFR, Estimated: 39 mL/min — ABNORMAL LOW (ref >60)
GFR, Estimated: 38 mL/min — ABNORMAL LOW (ref >60)
Glucose, Bld: 112 mg/dL — ABNORMAL HIGH (ref 70–99)
Glucose, Bld: 99 mg/dL (ref 70–99)
Potassium: 3.9 mmol/L (ref 3.5–5.1)
Potassium: 3.6 mmol/L (ref 3.5–5.1)
Sodium: 140 mmol/L (ref 135–145)
Sodium: 140 mmol/L (ref 135–145)
Total Bilirubin: 0.7 mg/dL (ref 0.3–1.2)
Total Bilirubin: 0.6 mg/dL (ref 0.3–1.2)
Total Protein: 6.9 g/dL (ref 6.5–8.1)
Total Protein: 6.9 g/dL (ref 6.5–8.1)

## 2021-02-08 LAB — HEMOGLOBIN A1C
Hgb A1c MFr Bld: 4.7 % — ABNORMAL LOW (ref 4.8–5.6)
Mean Plasma Glucose: 88.19 mg/dL

## 2021-02-08 LAB — TROPONIN I (HIGH SENSITIVITY)
Troponin I (High Sensitivity): 42 ng/L — ABNORMAL HIGH (ref <18)
Troponin I (High Sensitivity): 38 ng/L — ABNORMAL HIGH (ref <18)

## 2021-02-08 LAB — RESP PANEL BY RT-PCR (FLU A&B, COVID) ARPGX2
Influenza A by PCR: NEGATIVE
Influenza B by PCR: NEGATIVE
SARS Coronavirus 2 by RT PCR: NEGATIVE

## 2021-02-08 LAB — LIPID PANEL
Cholesterol: 145 mg/dL (ref 0–200)
HDL: 37 mg/dL — ABNORMAL LOW (ref >40)
LDL Cholesterol: 82 mg/dL (ref 0–99)
Total CHOL/HDL Ratio: 3.9 RATIO
Triglycerides: 131 mg/dL (ref <150)
VLDL: 26 mg/dL (ref 0–40)

## 2021-02-08 LAB — PROTIME-INR
INR: 1 (ref 0.8–1.2)
Prothrombin Time: 13 seconds (ref 11.4–15.2)

## 2021-02-08 LAB — APTT: aPTT: 28 seconds (ref 24–36)

## 2021-02-08 LAB — BRAIN NATRIURETIC PEPTIDE: B Natriuretic Peptide: 585.1 pg/mL — ABNORMAL HIGH (ref 0.0–100.0)

## 2021-02-08 LAB — TSH: TSH: 1.262 u[IU]/mL (ref 0.350–4.500)

## 2021-02-08 MED ORDER — HEPARIN (PORCINE) 25000 UT/250ML-% IV SOLN
800.0000 [IU]/h | INTRAVENOUS | Status: DC
  Administered 2021-02-08: 800 [IU]/h via INTRAVENOUS
  Filled 2021-02-08: qty 250

## 2021-02-08 MED ORDER — ASPIRIN 81 MG PO CHEW
243.0000 mg | CHEWABLE_TABLET | Freq: Once | ORAL | Status: AC
  Administered 2021-02-08: 243 mg via ORAL
  Filled 2021-02-08: qty 3

## 2021-02-08 MED ORDER — ASPIRIN 81 MG PO CHEW: 324.0000 mg | CHEWABLE_TABLET | Freq: Once | ORAL | Status: DC

## 2021-02-08 MED ORDER — SODIUM CHLORIDE 0.9 % IV SOLN: INTRAVENOUS | Status: DC

## 2021-02-08 MED ORDER — FUROSEMIDE 10 MG/ML IJ SOLN
40.0000 mg | Freq: Once | INTRAMUSCULAR | Status: AC
  Administered 2021-02-09: 40 mg via INTRAVENOUS
  Filled 2021-02-08: qty 4

## 2021-02-08 NOTE — ED Provider Notes (Signed)
Emergency Medicine Provider Triage Evaluation Note  Jerry Edwards , a 50 y.o. male  was evaluated in triage.  Pt complains of chest pain.  Chest pain started a few minutes ago after he got some bad news about a family member.  Patient reports chest pain is midsternal and does not radiate.  Patient describes pain as "someone standing on my chest."  Patient endorses nausea and shortness of breath.  Patient states that he has shortness of breath at rest and is usually on oxygen.  Patient reports a history of CHF states that he takes Entresto, digoxin along with other medications.  Patient states that he took all of his medications today including 81 mg aspirin.  Review of Systems  Positive: Chest pain, shortness of breath, nausea Negative: Diaphoresis, lightheadedness, syncope  Physical Exam  BP (!) 127/94 (BP Location: Left Arm)   Pulse 90   Temp 98.6 F (37 C) (Oral)   Resp 20   SpO2 98%  Gen:   Awake, no distress, patient appears anxious Resp:  Normal effort, lungs clear to auscultation bilaterally MSK:   Moves extremities without difficulty; edema to left lower extremity.  No tenderness to bilateral lower extremities. Other:  +2 radial pulse bilaterally.  Medical Decision Making  Medically screening exam initiated at 5:04 PM.  Appropriate orders placed.  Gaddiel Cullens was informed that the remainder of the evaluation will be completed by another provider, this initial triage assessment does not replace that evaluation, and the importance of remaining in the ED until their evaluation is complete.  Patient will be moved back to next available bed.   Loni Beckwith, PA-C 02/08/21 1706    Pattricia Boss, MD 02/09/21 1100

## 2021-02-08 NOTE — ED Triage Notes (Signed)
Pt reports sudden onset of pain to center of chest and SOB that started a few minutes ago when he received bad news about a family member.
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
## 2021-02-08 NOTE — ED Notes (Signed)
Cardiologist at bedside.  
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
## 2021-02-09 ENCOUNTER — Observation Stay (HOSPITAL_BASED_OUTPATIENT_CLINIC_OR_DEPARTMENT_OTHER): Payer: Medicare Other

## 2021-02-09 DIAGNOSIS — R079 Chest pain, unspecified: Secondary | ICD-10-CM

## 2021-02-09 DIAGNOSIS — R072 Precordial pain: Secondary | ICD-10-CM | POA: Diagnosis not present

## 2021-02-09 LAB — ECHOCARDIOGRAM COMPLETE
Area-P 1/2: 2.34 cm2
Calc EF: 37.5 %
Height: 59 in
Single Plane A2C EF: 30.2 %
Single Plane A4C EF: 44.3 %
Weight: 3120 oz

## 2021-02-09 LAB — CBC
HCT: 42.2 % (ref 39.0–52.0)
HCT: 41.3 % (ref 39.0–52.0)
Hemoglobin: 12.8 g/dL — ABNORMAL LOW (ref 13.0–17.0)
Hemoglobin: 12.6 g/dL — ABNORMAL LOW (ref 13.0–17.0)
MCH: 29 pg (ref 26.0–34.0)
MCH: 29.3 pg (ref 26.0–34.0)
MCHC: 30.3 g/dL (ref 30.0–36.0)
MCHC: 30.5 g/dL (ref 30.0–36.0)
MCV: 95.7 fL (ref 80.0–100.0)
MCV: 96 fL (ref 80.0–100.0)
Platelets: 258 10*3/uL (ref 150–400)
Platelets: 244 10*3/uL (ref 150–400)
RBC: 4.41 MIL/uL (ref 4.22–5.81)
RBC: 4.3 MIL/uL (ref 4.22–5.81)
RDW: 12 % (ref 11.5–15.5)
RDW: 12 % (ref 11.5–15.5)
WBC: 8.5 10*3/uL (ref 4.0–10.5)
WBC: 8.4 10*3/uL (ref 4.0–10.5)
nRBC: 0 % (ref 0.0–0.2)
nRBC: 0 % (ref 0.0–0.2)

## 2021-02-09 LAB — HIV ANTIBODY (ROUTINE TESTING W REFLEX): HIV Screen 4th Generation wRfx: NONREACTIVE

## 2021-02-09 LAB — CREATININE, SERUM
Creatinine, Ser: 2.04 mg/dL — ABNORMAL HIGH (ref 0.61–1.24)
GFR, Estimated: 39 mL/min — ABNORMAL LOW (ref >60)

## 2021-02-09 MED ORDER — CARVEDILOL 3.125 MG PO TABS
12.5000 mg | ORAL_TABLET | Freq: Two times a day (BID) | ORAL | Status: DC
  Administered 2021-02-09: 12.5 mg via ORAL
  Filled 2021-02-09: qty 1

## 2021-02-09 MED ORDER — NITROGLYCERIN 0.4 MG SL SUBL: 0.4000 mg | SUBLINGUAL_TABLET | SUBLINGUAL | 12 refills | Status: DC | PRN

## 2021-02-09 MED ORDER — ACETAMINOPHEN 325 MG PO TABS: 650.0000 mg | ORAL_TABLET | ORAL | Status: DC | PRN

## 2021-02-09 MED ORDER — DOXAZOSIN MESYLATE 1 MG PO TABS
1.0000 mg | ORAL_TABLET | Freq: Every day | ORAL | Status: DC
  Administered 2021-02-09: 1 mg via ORAL
  Filled 2021-02-09 (×2): qty 1

## 2021-02-09 MED ORDER — SACUBITRIL-VALSARTAN 97-103 MG PO TABS
1.0000 | ORAL_TABLET | Freq: Two times a day (BID) | ORAL | Status: DC
Start: 2021-02-09 — End: 2021-02-09
  Administered 2021-02-09 (×2): 1 via ORAL
  Filled 2021-02-09 (×3): qty 1

## 2021-02-09 MED ORDER — ISOSORBIDE MONONITRATE ER 30 MG PO TB24
30.0000 mg | ORAL_TABLET | Freq: Every day | ORAL | Status: DC
  Administered 2021-02-09: 30 mg via ORAL
  Filled 2021-02-09: qty 1

## 2021-02-09 MED ORDER — HEPARIN SODIUM (PORCINE) 5000 UNIT/ML IJ SOLN
5000.0000 [IU] | Freq: Three times a day (TID) | INTRAMUSCULAR | Status: DC
  Administered 2021-02-09: 5000 [IU] via SUBCUTANEOUS
  Filled 2021-02-09 (×2): qty 1

## 2021-02-09 MED ORDER — ISOSORBIDE MONONITRATE ER 30 MG PO TB24: 30.0000 mg | ORAL_TABLET | Freq: Every day | ORAL | 3 refills | Status: DC

## 2021-02-09 MED ORDER — FUROSEMIDE 20 MG PO TABS: 40.0000 mg | ORAL_TABLET | Freq: Every day | ORAL | Status: DC

## 2021-02-09 MED ORDER — NITROGLYCERIN 0.4 MG SL SUBL: 0.4000 mg | SUBLINGUAL_TABLET | SUBLINGUAL | Status: DC | PRN

## 2021-02-09 MED ORDER — FUROSEMIDE 20 MG PO TABS
80.0000 mg | ORAL_TABLET | Freq: Every day | ORAL | Status: DC
  Administered 2021-02-09: 80 mg via ORAL
  Filled 2021-02-09: qty 4

## 2021-02-09 MED ORDER — CARVEDILOL 12.5 MG PO TABS: 12.5000 mg | ORAL_TABLET | Freq: Two times a day (BID) | ORAL | 3 refills | Status: DC

## 2021-02-09 MED ORDER — HYDRALAZINE HCL 50 MG PO TABS
100.0000 mg | ORAL_TABLET | Freq: Three times a day (TID) | ORAL | Status: DC
  Administered 2021-02-09 (×2): 100 mg via ORAL
  Filled 2021-02-09 (×2): qty 2

## 2021-02-09 MED ORDER — ASPIRIN EC 81 MG PO TBEC
81.0000 mg | DELAYED_RELEASE_TABLET | Freq: Every day | ORAL | Status: DC
  Administered 2021-02-09: 81 mg via ORAL
  Filled 2021-02-09: qty 1

## 2021-02-09 MED ORDER — ONDANSETRON HCL 4 MG/2ML IJ SOLN: 4.0000 mg | Freq: Four times a day (QID) | INTRAMUSCULAR | Status: DC | PRN

## 2021-02-09 NOTE — Progress Notes (Signed)
Progress Note  Patient Name: Jerry Edwards Date of Encounter: 02/09/2021  [facility] Cardiologist: Bensimhon  Subjective   Denies CP   no SOB    Inpatient Medications    Scheduled Meds:  aspirin EC  81 mg Oral Daily   carvedilol  12.5 mg Oral BID WC   doxazosin  1 mg Oral QHS   furosemide  40 mg Oral Q supper   furosemide  80 mg Oral Q breakfast   heparin  5,000 Units Subcutaneous Q8H   hydrALAZINE  100 mg Oral Q8H   isosorbide mononitrate  30 mg Oral Daily   sacubitril-valsartan  1 tablet Oral BID   Continuous Infusions:  sodium chloride     PRN Meds: acetaminophen, nitroGLYCERIN, ondansetron (ZOFRAN) IV   Vital Signs    Vitals:   02/09/21 0607 02/09/21 0700 02/09/21 0900 02/09/21 1200  BP: 129/75 (!) 143/95 131/77 108/60  Pulse: 79 (!) 102 94 81  Resp: 16 (!) 24 19 (!) 24  Temp:      TempSrc:      SpO2: 95% 95% 96% 93%  Weight:      Height:        Intake/Output Summary (Last 24 hours) at 02/09/2021 1415 Last data filed at 02/09/2021 1255 Gross per 24 hour  Intake 38.5 ml  Output 1175 ml  Net -1136.5 ml   Last 3 Weights 02/09/2021 06/17/2020 06/28/2019  Weight (lbs) 195 lb 228 lb 6.4 oz 224 lb 3.2 oz  Weight (kg) 88.451 kg 103.602 kg 101.696 kg      Telemetry    SR - Personally Reviewed  ECG    No new - Personally Reviewed  Physical Exam   GEN: No acute distress.   Neck: No JVD Cardiac: RRR, no murmurs, rubs, or gallops.  Respiratory: Clear to auscultation bilaterally. GI: Soft, nontender, non-distended  MS: No edema; No deformity. Neuro:  Nonfocal  Psych: Normal affect   Labs    High Sensitivity Troponin:   Recent Labs  Lab 02/08/21 1705 02/08/21 2109  TROPONINIHS 42* 38*     Chemistry Recent Labs  Lab 02/08/21 1705 02/08/21 1754 02/09/21 0037  NA 140 140  --   K 3.9 3.6  --   CL 107 107  --   CO2 23 25  --   GLUCOSE 112* 99  --   BUN 26* 26*  --   CREATININE 2.03* 2.08* 2.04*  CALCIUM 8.9 8.8*  --   PROT 6.9 6.9   --   ALBUMIN 2.8* 2.8*  --   AST 29 28  --   ALT 20 20  --   ALKPHOS 62 63  --   BILITOT 0.7 0.6  --   GFRNONAA 39* 38* 39*  ANIONGAP 10 8  --     Lipids  Recent Labs  Lab 02/08/21 1754  CHOL 145  TRIG 131  HDL 37*  LDLCALC 82  CHOLHDL 3.9    Hematology Recent Labs  Lab 02/08/21 1754 02/09/21 0037 02/09/21 0312  WBC 9.6 8.5 8.4  RBC 4.34 4.41 4.30  HGB 12.6* 12.8* 12.6*  HCT 41.6 42.2 41.3  MCV 95.9 95.7 96.0  MCH 29.0 29.0 29.3  MCHC 30.3 30.3 30.5  RDW 12.1 12.0 12.0  PLT 273 258 244   Thyroid  Recent Labs  Lab 02/08/21 2109  TSH 1.262    BNP Recent Labs  Lab 02/08/21 2110  BNP 585.1*    DDimer No results for input(s): DDIMER in the  last 168 hours.   Radiology    DG Chest Portable 1 View  Result Date: 02/08/2021 CLINICAL DATA:  Midsternal chest pain EXAM: PORTABLE CHEST 1 VIEW COMPARISON:  09/19/2017 FINDINGS: Single frontal view of the chest demonstrates an unremarkable cardiac silhouette. No airspace disease, effusion, or pneumothorax. No acute bony abnormality. IMPRESSION: 1. No acute intrathoracic process. Electronically Signed   By: Randa Ngo M.D.   On: 02/08/2021 18:21   ECHOCARDIOGRAM COMPLETE  Result Date: 02/09/2021    ECHOCARDIOGRAM REPORT   Patient Name:   [name] Date of Exam: 02/09/2021 Medical Rec #:  [id number]     Height:       59.0 in Accession #:    [id number]    Weight:       195.0 lb Date of Birth:  1971-01-31     BSA:          1.825 m Patient Age:    50 years      BP:           131/77 mmHg Patient Gender: M             HR:           81 bpm. Exam Location:  Inpatient Procedure: 2D Echo, Color Doppler and Cardiac Doppler Indications:    R07.9* Chest pain, unspecified  History:        Patient has prior history of Echocardiogram examinations, most                 recent 07/25/2019. CHF; Risk Factors:Hypertension and Sleep Apnea.  Sonographer:    Raquel Sarna Senior RDCS Referring Phys: [id number] MATTHEW A CARLISLE  Sonographer Comments:  Technically difficult due to patient body habitus. IMPRESSIONS  1. Technically difficult; no parasternal views; apical akinesis with overall moderate LV dysfunction.  2. Left ventricular ejection fraction, by estimation, is 35 to 40%. The left ventricle has moderately decreased function. The left ventricle demonstrates regional wall motion abnormalities (see scoring diagram/findings for description). Left ventricular  diastolic parameters are consistent with Grade I diastolic dysfunction (impaired relaxation).  3. Right ventricular systolic function is normal. The right ventricular size is normal.  4. The mitral valve is normal in structure. No evidence of mitral valve regurgitation. No evidence of mitral stenosis.  5. The aortic valve was not well visualized. Aortic valve regurgitation is not visualized. No aortic stenosis is present.  6. The inferior vena cava is normal in size with greater than 50% respiratory variability, suggesting right atrial pressure of 3 mmHg. FINDINGS  Left Ventricle: Left ventricular ejection fraction, by estimation, is 35 to 40%. The left ventricle has moderately decreased function. The left ventricle demonstrates regional wall motion abnormalities. The left ventricular internal cavity size was normal in size. There is no left ventricular hypertrophy. Left ventricular diastolic parameters are consistent with Grade I diastolic dysfunction (impaired relaxation). Right Ventricle: The right ventricular size is normal. Right ventricular systolic function is normal. Left Atrium: Left atrial size was normal in size. Right Atrium: Right atrial size was normal in size. Pericardium: There is no evidence of pericardial effusion. Mitral Valve: The mitral valve is normal in structure. No evidence of mitral valve regurgitation. No evidence of mitral valve stenosis. Tricuspid Valve: The tricuspid valve is normal in structure. Tricuspid valve regurgitation is not demonstrated. No evidence of tricuspid  stenosis. Aortic Valve: The aortic valve was not well visualized. Aortic valve regurgitation is not visualized. No aortic stenosis is present. Pulmonic Valve: The pulmonic valve was not  well visualized. Pulmonic valve regurgitation is not visualized. No evidence of pulmonic stenosis. Aorta: The ascending aorta was not well visualized. Venous: The inferior vena cava is normal in size with greater than 50% respiratory variability, suggesting right atrial pressure of 3 mmHg. IAS/Shunts: No atrial level shunt detected by color flow Doppler. Additional Comments: Technically difficult; no parasternal views; apical akinesis with overall moderate LV dysfunction.  LEFT VENTRICLE PLAX 2D LVOT diam:     2.00 cm      Diastology LV SV:         54           LV e' medial:    6.09 cm/s LV SV Index:   29           LV E/e' medial:  9.1 LVOT Area:     3.14 cm     LV e' lateral:   6.74 cm/s                             LV E/e' lateral: 8.2  LV Volumes (MOD) LV vol d, MOD A2C: 159.0 ml LV vol d, MOD A4C: 152.0 ml LV vol s, MOD A2C: 111.0 ml LV vol s, MOD A4C: 84.6 ml LV SV MOD A2C:     48.0 ml LV SV MOD A4C:     152.0 ml LV SV MOD BP:      58.7 ml RIGHT VENTRICLE RV S prime:     11.00 cm/s TAPSE (M-mode): 1.6 cm LEFT ATRIUM             Index        RIGHT ATRIUM           Index LA Vol (A2C):   55.3 ml 30.31 ml/m  RA Area:     16.00 cm LA Vol (A4C):   48.2 ml 26.42 ml/m  RA Volume:   40.10 ml  21.98 ml/m LA Biplane Vol: 53.6 ml 29.38 ml/m  AORTIC VALVE LVOT Vmax:   93.40 cm/s LVOT Vmean:  68.500 cm/s LVOT VTI:    0.171 m MITRAL VALVE MV Area (PHT): 2.34 cm    SHUNTS MV Decel Time: 324 msec    Systemic VTI:  0.17 m MV E velocity: 55.50 cm/s  Systemic Diam: 2.00 cm MV A velocity: 78.60 cm/s MV E/A ratio:  0.71 Kirk Ruths MD Electronically signed by Kirk Ruths MD Signature Date/Time: 02/09/2021/1:17:12 PM    Final     Cardiac Studies  Technically difficult; no parasternal views; apical akinesis with overall moderate  LV dysfunction. 1. Left ventricular ejection fraction, by estimation, is 35 to 40%. The left ventricle has moderately decreased function. The left ventricle demonstrates regional wall motion abnormalities (see scoring diagram/findings for description). Left ventricular diastolic parameters are consistent with Grade I diastolic dysfunction (impaired relaxation). 2. 3. Right ventricular systolic function is normal. The right ventricular size is normal. The mitral valve is normal in structure. No evidence of mitral valve regurgitation. No evidence of mitral stenosis. 4. The aortic valve was not well visualized. Aortic valve regurgitation is not visualized. No aortic stenosis is present. 5. The inferior vena cava is normal in size with greater than 50% respiratory variability, suggesting right atrial pressure of 3 mmHg.   Patient Profile     Leevi Cullars is a 50 y.o. male with HFrEF/NICM(EF 35%), PH, HTN, morbid obesity, severe OSA on home BiPAP, and osteogenesis imperfecta who presents with chest pain and anxiety.  Assessment & Plan    1  Chest pain  Resolved  pateint says htat he got bad news last night   Drank a cold drink fast   Got chest pressure  Scared  Came to ED   Echo:  LVEF is unchanged    I would keep on same meds   Continue medical Rx  2  Hx NICM   Cath in 2018   Normal coronary arteries    Echo sows LVEF overall unchanged    Continue meds    Limt NA (Pt has bojangles in room)   3  HTN  BP is better today on current mesd   Yesterday BP was high   Says he had bad news    OK to d/c today    Sched f/u in clinic     For questions or updates, please contact [facility] Please consult [email or web address] for contact info under        Signed, Dorris Carnes, MD  02/09/2021, 2:15 PM
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
## 2021-02-09 NOTE — Progress Notes (Signed)
Echocardiogram 2D Echocardiogram has been performed.   Oneal Deputy Katalina Magri RDCS 02/09/2021, 11:34 AM

## 2021-02-09 NOTE — ED Notes (Signed)
Pt requested to take off gown and put on his t-shirt.  Sts the gown is uncomfortable.  Pt aware that he needs to remained on cardiac monitoring, BP cuff, and pulse ox.

## 2021-02-09 NOTE — Care Management CC44 (Signed)
Condition Code 44 Documentation Completed  Patient Details  Name: Jerry Edwards MRN: [id number] Date of Birth: December 26, 1970   Condition Code 44 given:  Yes Patient signature on Condition Code 44 notice:  Yes Documentation of 2 MD's agreement:  Yes Code 44 added to claim:  Yes    Laurena Slimmer, RN 02/09/2021, 4:17 PM

## 2021-02-09 NOTE — ED Notes (Signed)
Patient transported to Echo.

## 2021-02-09 NOTE — ED Notes (Signed)
Patient verbalizes understanding of discharge instructions. Opportunity for questioning and answers were provided. Armband removed by staff, pt discharged from ED to home with friend. Understands importance of follow up and that he has RX at pharmacy via escript

## 2021-02-09 NOTE — Care Management Obs Status (Signed)
[place] NOTIFICATION   Patient Details  Name: Jerry Edwards MRN: [id number] Date of Birth: [birth year]   Medicare Observation Status Notification Given:  Ernesta Amble, RN 02/09/2021, 4:17 PM

## 2021-02-09 NOTE — Care Management (Addendum)
Called patient regarding OBS status , patients Provider just entered the room, patient requested a call back shortly.  1440 called patient back, no answer LM to return call, ASAP 1445 Messaged RN that patient cannot be DC until notice received 1540 have not heard from patient, recalled, went to voicemail.

## 2021-02-14 ENCOUNTER — Emergency Department (HOSPITAL_COMMUNITY)
Admission: EM | Admit: 2021-02-14 | Discharge: 2021-02-14 | Disposition: A | Discharge status: Home / Self Care | Payer: Medicare Other | Attending: Emergency Medicine | Admitting: Emergency Medicine

## 2021-02-14 ENCOUNTER — Other Ambulatory Visit (HOSPITAL_COMMUNITY): Payer: Self-pay | Admitting: Internal Medicine

## 2021-02-14 ENCOUNTER — Encounter (HOSPITAL_COMMUNITY): Payer: Self-pay

## 2021-02-14 DIAGNOSIS — Z91010 Allergy to peanuts: Secondary | ICD-10-CM | POA: Insufficient documentation

## 2021-02-14 DIAGNOSIS — Z87891 Personal history of nicotine dependence: Secondary | ICD-10-CM | POA: Diagnosis not present

## 2021-02-14 DIAGNOSIS — F29 Unspecified psychosis not due to a substance or known physiological condition: Secondary | ICD-10-CM | POA: Diagnosis not present

## 2021-02-14 DIAGNOSIS — I110 Hypertensive heart disease with heart failure: Secondary | ICD-10-CM | POA: Insufficient documentation

## 2021-02-14 DIAGNOSIS — Z91040 Latex allergy status: Secondary | ICD-10-CM | POA: Insufficient documentation

## 2021-02-14 DIAGNOSIS — Z955 Presence of coronary angioplasty implant and graft: Secondary | ICD-10-CM | POA: Insufficient documentation

## 2021-02-14 DIAGNOSIS — I5023 Acute on chronic systolic (congestive) heart failure: Secondary | ICD-10-CM | POA: Diagnosis not present

## 2021-02-14 DIAGNOSIS — Z79899 Other long term (current) drug therapy: Secondary | ICD-10-CM | POA: Diagnosis not present

## 2021-02-14 DIAGNOSIS — Z7982 Long term (current) use of aspirin: Secondary | ICD-10-CM | POA: Insufficient documentation

## 2021-02-14 DIAGNOSIS — F22 Delusional disorders: Secondary | ICD-10-CM | POA: Diagnosis not present

## 2021-02-14 DIAGNOSIS — Z20822 Contact with and (suspected) exposure to covid-19: Secondary | ICD-10-CM | POA: Insufficient documentation

## 2021-02-14 LAB — CBC WITH DIFFERENTIAL/PLATELET
Abs Immature Granulocytes: 0.02 10*3/uL (ref 0.00–0.07)
Basophils Absolute: 0 10*3/uL (ref 0.0–0.1)
Basophils Relative: 0 %
Eosinophils Absolute: 0.1 10*3/uL (ref 0.0–0.5)
Eosinophils Relative: 1 %
HCT: 41.4 % (ref 39.0–52.0)
Hemoglobin: 12.6 g/dL — ABNORMAL LOW (ref 13.0–17.0)
Immature Granulocytes: 0 %
Lymphocytes Relative: 24 %
Lymphs Abs: 2.4 10*3/uL (ref 0.7–4.0)
MCH: 29.2 pg (ref 26.0–34.0)
MCHC: 30.4 g/dL (ref 30.0–36.0)
MCV: 96.1 fL (ref 80.0–100.0)
Monocytes Absolute: 1.4 10*3/uL — ABNORMAL HIGH (ref 0.1–1.0)
Monocytes Relative: 14 %
Neutro Abs: 5.9 10*3/uL (ref 1.7–7.7)
Neutrophils Relative %: 61 %
Platelets: 267 10*3/uL (ref 150–400)
RBC: 4.31 MIL/uL (ref 4.22–5.81)
RDW: 11.9 % (ref 11.5–15.5)
WBC: 9.8 10*3/uL (ref 4.0–10.5)
nRBC: 0 % (ref 0.0–0.2)

## 2021-02-14 LAB — ETHANOL: Alcohol, Ethyl (B): <10 mg/dL (ref <10)

## 2021-02-14 LAB — COMPREHENSIVE METABOLIC PANEL
ALT: 20 U/L (ref 0–44)
AST: 21 U/L (ref 15–41)
Albumin: 2.8 g/dL — ABNORMAL LOW (ref 3.5–5.0)
Alkaline Phosphatase: 54 U/L (ref 38–126)
Anion gap: 9 (ref 5–15)
BUN: 44 mg/dL — ABNORMAL HIGH (ref 6–20)
CO2: 24 mmol/L (ref 22–32)
Calcium: 8.6 mg/dL — ABNORMAL LOW (ref 8.9–10.3)
Chloride: 107 mmol/L (ref 98–111)
Creatinine, Ser: 2.44 mg/dL — ABNORMAL HIGH (ref 0.61–1.24)
GFR, Estimated: 31 mL/min — ABNORMAL LOW (ref >60)
Glucose, Bld: 102 mg/dL — ABNORMAL HIGH (ref 70–99)
Potassium: 3.8 mmol/L (ref 3.5–5.1)
Sodium: 140 mmol/L (ref 135–145)
Total Bilirubin: 0.5 mg/dL (ref 0.3–1.2)
Total Protein: 6.6 g/dL (ref 6.5–8.1)

## 2021-02-14 LAB — RESP PANEL BY RT-PCR (FLU A&B, COVID) ARPGX2
Influenza A by PCR: NEGATIVE
Influenza B by PCR: NEGATIVE
SARS Coronavirus 2 by RT PCR: NEGATIVE

## 2021-02-14 MED ORDER — ISOSORBIDE MONONITRATE ER 30 MG PO TB24
30.0000 mg | ORAL_TABLET | Freq: Every day | ORAL | Status: DC
  Filled 2021-02-14: qty 1

## 2021-02-14 MED ORDER — ASPIRIN EC 81 MG PO TBEC: 81.0000 mg | DELAYED_RELEASE_TABLET | Freq: Every day | ORAL | Status: DC

## 2021-02-14 MED ORDER — CARVEDILOL 12.5 MG PO TABS: 12.5000 mg | ORAL_TABLET | Freq: Two times a day (BID) | ORAL | Status: DC

## 2021-02-14 MED ORDER — SACUBITRIL-VALSARTAN 97-103 MG PO TABS
1.0000 | ORAL_TABLET | Freq: Two times a day (BID) | ORAL | Status: DC
  Filled 2021-02-14: qty 1

## 2021-02-14 NOTE — ED Notes (Signed)
Patient refusing to change into burgundy scrubs. Scrubs placed at bedside.

## 2021-02-14 NOTE — ED Notes (Signed)
Multiple staff members attempted to deescalated pt for 40 minutes. Two NTs attempted to assist pt into hospital gown. Pt refused to cooperate with vitals and changing into hospital gown. Pt told Animal nutritionist to push his wheelchair to the exit. Pt departed AMA.
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
## 2021-02-14 NOTE — ED Notes (Addendum)
Upon rooming, this EMT attempted to assist pt into bed. Pt became agitated stating "I have brittle bones and I don't want to sue you if I fall." This EMT attempted again to assist when pt began cursing. This EMT exited room and informed staff of pt demeanor
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
## 2021-02-17 DIAGNOSIS — I5042 Chronic combined systolic (congestive) and diastolic (congestive) heart failure: Secondary | ICD-10-CM | POA: Diagnosis not present

## 2021-02-17 DIAGNOSIS — R062 Wheezing: Secondary | ICD-10-CM | POA: Diagnosis not present

## 2021-02-17 DIAGNOSIS — I110 Hypertensive heart disease with heart failure: Secondary | ICD-10-CM | POA: Diagnosis not present

## 2021-02-17 DIAGNOSIS — G4733 Obstructive sleep apnea (adult) (pediatric): Secondary | ICD-10-CM | POA: Diagnosis not present

## 2021-02-21 ENCOUNTER — Telehealth (HOSPITAL_COMMUNITY): Payer: Self-pay

## 2021-02-21 NOTE — Telephone Encounter (Signed)
Called and left patient a detailed message to confirm/remind patient of their appointment at the [facility] on 02/24/21.   I also asked if patient could bring all medications and/or complete list and to return a call to our clinic if needed.

## 2021-02-23 NOTE — Progress Notes (Incomplete)
Patient ID: Jerry Edwards, male   DOB: 1970/10/21, 50 y.o.   MRN: [id number]   [facility]     Primary Physician: [name], NP Primary Cardiologist: [name] HF: [name]   HPI: Jerry Edwards is a 50 y.o. male with history of NICM, Chronic systolic heart failure, HTN, OSA, and osteogenic imperfecta.   Admitted 1/15 with HF symptoms. ECHO 04/08/2013. EF 25%. cMRI 12/15 EF 38% possible infiltrative disease with discrete areas of non-coronary pattern delayed enhancement. Coronaries were normal on LHC.  SPEP no M spike and UPEP no free light chains.   Admitted 1/17 with recurrent HF, severe HTN and hypoxemia. Diuresed and discharged home.   Admitted 12/17 with acute respiratory failure and HTN crisis after stopping several meds.   Admitted 7/18 with atypical CP. Echo and Stress test as below.    PYP scan 8/18 with ratio of 1.2 (felt to be negative) - unable to have MRI (couldnt fit)  Admitted 6/19 with ADHF. Echo showed lower EF 25-30%. HF medications optimized. DC weight: 216 lbs  Sleep study 8/19 AHI 88/hr  He presents today for HF follow up. Says he feels awesome. Weight is stable. Trying to limit fluids. Breathing is good. No orthopnea or PND. Wearing CPAP. Struggling with swelling of R eyelid.   Admitted 11/22 for CP, cards consulted and cardiac work up reassuring. Echo showed EF 35-40%.   Seen 02/14/21 at [facility] for psychotic episode. He became combative and left AMA.   Echo  10/19 EF 30-35%  Echo 09/20/17: EF 25-30%, grade 2 DD Echo 10/13/16 LVEF 35-40%, Grade 1 DD, Mild MR, Mild LAE Echo 1/17 30-35% RV ok  Echo 02/22/16 LVEF 40%, Grade 1 DD.      Myoview 10/15/16 No change in EKG from baseline T wave inversions in the inferolateral leads There is a medium defect of mild severity present in the basal inferior, mid inferior and apical inferior location. The defect is non-reversible and consistent with prior infarct. There is a small defect of moderate severity  present in the apex location. The defect is partially reversible and consistent with infarct with possible small area of peri infarct ischemia. This is a high risk study. EF not calculated but visually appears to be moderately to severely reduced.   LHC/RHC 04/08/13  Normal coronaries RA pressure:  39/39 with a mean of 34. Prominent X. and Y. Descends.  RV pressure: 76/26  mean 4 PA pressure: 70/36 with a mean of 51  Pulmonary capillary wedge pressure: 31/30 and mean of 29  LV pressure: 140/26 with a left ventricular end-diastolic pressure of 35  PA sat 66%  AO sat 97%  CO/CI 4.94/2.63  Pulmonary vascular resistance: 4.45 Woods units   PMH: 1. HTN 2. Osteogenesis imperfecta: wheelchair-bound.  History of bone fractures.  3. Suspect OSA 4. CKD 5. Nonischemic cardiomyopathy: Echo (1/15) with EF 20-25%, severe diffuse hypokinesis, mild MR, mildly enlarged RV with moderately decreased systolic function.  LHC/RHC (1/15) with normal coronaries, mean RA 34, PA 70/36 mean 51, mean PCWP 29, CI 2.63.  Cardiac MRI (1/15) with EF 38%, global hypokinesis, discrete areas of delayed enhancement in the subepicardial mid inferoseptal RV insertion site, the mid wall of the mid inferolateral wall, and the subendocardial mid anterolateral wall.  This pattern was suggestive of infiltrative disease.  UPEP/SPEP negative, HIV negative.  Chest CT to look for evidence of sarcoidosis did not show any sarcoid-type lesions.    SH:  Single, prior smoking now quit, occasional  marijuana, works as Dispensing optician.   FH:  HTN, no significant cardiac disease.   Current Outpatient Medications  Medication Sig Dispense Refill   aspirin 81 MG tablet Take 1 tablet (81 mg total) by mouth daily.     carvedilol (COREG) 12.5 MG tablet Take 1 tablet (12.5 mg total) by mouth 2 (two) times daily with a meal. (Patient not taking: Reported on 02/14/2021) 180 tablet 3   doxazosin (CARDURA) 1 MG tablet TAKE 1 TABLET BY MOUTH AT BEDTIME 30 tablet  0   furosemide (LASIX) 80 MG tablet Take 1 tablet (80 mg total) by mouth in the morning AND 0.5 tablets (40 mg total) every evening. 135 tablet 6   hydrALAZINE (APRESOLINE) 100 MG tablet TAKE 1 TABLET BY MOUTH THREE TIMES DAILY (Patient not taking: Reported on 02/14/2021) 270 tablet 3   isosorbide mononitrate (IMDUR) 30 MG 24 hr tablet Take 1 tablet (30 mg total) by mouth daily. (Patient not taking: Reported on 02/14/2021) 90 tablet 3   nitroGLYCERIN (NITROSTAT) 0.4 MG SL tablet Place 1 tablet (0.4 mg total) under the tongue every 5 (five) minutes x 3 doses as needed for chest pain. (Patient not taking: Reported on 02/14/2021) 25 tablet 12   potassium chloride SA (KLOR-CON) 20 MEQ tablet Take 1 tablet (20 mEq total) by mouth daily. (Patient taking differently: Take 40 mEq by mouth at bedtime.) 30 tablet 6   sacubitril-valsartan (ENTRESTO) 97-103 MG Take 1 tablet by mouth 2 (two) times daily. 60 tablet 6   No current facility-administered medications for this visit.    PHYSICAL EXAM: There were no vitals filed for this visit.   Wt Readings from Last 3 Encounters:  02/09/21 88.5 kg (195 lb)  06/17/20 103.6 kg (228 lb 6.4 oz)  06/28/19 101.7 kg (224 lb 3.2 oz)    General:  Short stature. In WC No resp difficulty HEENT: normal swollen left eyelid Neck: supple. no JVD. Carotids 2+ bilat; no bruits. No lymphadenopathy or thryomegaly appreciated. Cor: PMI nondisplaced. Regular rate & rhythm. No rubs, gallops or murmurs. Lungs: clear Abdomen: obese soft, nontender, nondistended. No hepatosplenomegaly. No bruits or masses. Good bowel sounds. Extremities: no cyanosis, clubbing, rash, edema Neuro: alert & orientedx3, cranial nerves grossly intact. moves all 4 extremities w/o difficulty. Affect pleasant   ECG: sinus brady 77  LVH with repol Personally reviewed    ASSESSMENT & PLAN: 1. Chronic Systolic Heart Failure. NICM EF 25% with no coronary disease on LHC in 1/15. 03/2013 cMRI possible  infiltrative disease. SPEP and UPEP ok.  - EF 10/13/2016 35-40%. Myoview high risk due to LV dysfunction with mild inferior scar  - Based on the cMRI pattern 12/15 and concern for cardiac sarcoidosis, a chest CT was done to look for evidence of pulmonary sarcoidosis.  This was not indicative of pulmonary sarcoidosis.  Patient has had no significant arrhythmias detected.  QRS on ECG is not widened.  - Pt unable to fit in cMRI machine due to body stature.  PYP scan 8/18 no amyloid - Echo 09/2017: EF 25-30%, grade 2 DD - Echo 10/19: EF  30-35% - Echo 5/21 EF 35% - stable NYHA II. Volume status looks good  - Continue lasix 80 mg am, 40 mg pm. Takes extra as needed - Continue coreg 12.5 mg BID. HR too low to increase - Continue Entresto 97/103 bid. - Continue hydralazine 100mg  TID. Continue imdur 30 mg daily.   - Off spiro for unclear reasons. Restart at 25 daily. Cut potassium to once  daily  - Consider Farxiga at next visit.  - Reinforced fluid restriction to < 2 L daily, sodium restriction to less than 2000 mg daily, and the importance of daily weights.   2. Osteogenesis imperfecta  - Wheel chair bound; limited mobility. No change  3. HTN. - Blood pressure mildly elevated. Adding back spiro 4. OSA  - Wears O2 at night.  - Had PSG on 11/07/17 with AHI 88/hr c/w very severe OSA. Was given a mask but no machine - Follows with Dr. Radford Pax. Back on CPAP 5. Morbid Obesity - There is no height or weight on file to calculate BMI. - Reinforced need to lose weight 6. Pulmonary hypertension- This appears to be primarily pulmonary venous hypertension on previous RHC.  There may be a component of PAH related to OSA.   - No change.  7. Chronic respiratory failure - Continue O2. Encouraged him to wear at all times.  - Back on CPAP  - Encouraged weight loss.     [place], [place]  02/23/2021 8:35 PM

## 2021-02-24 ENCOUNTER — Encounter (HOSPITAL_COMMUNITY): Payer: Medicare Other

## 2021-03-04 ENCOUNTER — Telehealth (HOSPITAL_COMMUNITY): Payer: Self-pay | Admitting: Surgery

## 2021-03-04 ENCOUNTER — Telehealth: Payer: Self-pay | Admitting: Licensed Clinical Social Worker

## 2021-03-04 NOTE — Telephone Encounter (Signed)
CSW asked to by Carole Binning, HF Clinic Coordinator to assist with patient's mother who came to the clinic with concerns regarding patient's follow up care. Patient's mother shared concerns that he is not taking his medications and she stated that he has not been acting like himself recently. She denies any history of mental health and states that he is only on medications for heart failure. CSW noted in patient's chart that there is a mental health history but unable to share with patient's mother. CSW did provide some resources for [employer] as she noted that he had not been acting like himself. She verbalized understanding of follow up in the clinic and will contact provided resources if she feels needed. CSW will be available for patient on Friday 12-16 at rescheduled appointment as he was a no show on 02-24-21. Raquel Sarna, [place], [place]

## 2021-03-04 NOTE — Telephone Encounter (Signed)
Patient's mother came into clinic asking to speak with someone regarding her son.  She is concerned saying that her son is "not taking his medications" and friends tell her that he is "drinking and drugging".  She says that she was aware of an appt that he had scheduled recently in our clinic that her son told her was cancelled.  Of note [name] is marked as a "no show" at last AHF clinic appt.  [name] was adamant that her son be worked in to see Dr. Haroldine Edwards as he is "the only person Jerry Edwards will listen to".  I have rescheduled the appt to next open slot for December 16th at [facility].  She is very happy- will share this information with her son and facilitate him coming (she is the person who transports him to appts).
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
## 2021-03-07 ENCOUNTER — Emergency Department (HOSPITAL_COMMUNITY): Payer: Medicare Other

## 2021-03-07 ENCOUNTER — Ambulatory Visit (HOSPITAL_BASED_OUTPATIENT_CLINIC_OR_DEPARTMENT_OTHER)
Admission: RE | Admit: 2021-03-07 | Discharge: 2021-03-07 | Disposition: A | Discharge status: Home / Self Care | Payer: Medicare Other | Source: Ambulatory Visit | Attending: Internal Medicine | Admitting: Internal Medicine

## 2021-03-07 ENCOUNTER — Other Ambulatory Visit: Payer: Self-pay

## 2021-03-07 ENCOUNTER — Emergency Department (HOSPITAL_COMMUNITY)
Admission: EM | Admit: 2021-03-07 | Discharge: 2021-03-08 | Disposition: A | Discharge status: Home / Self Care | Payer: Medicare Other | Attending: Emergency Medicine | Admitting: Emergency Medicine

## 2021-03-07 ENCOUNTER — Encounter (HOSPITAL_COMMUNITY): Payer: Self-pay

## 2021-03-07 ENCOUNTER — Encounter (HOSPITAL_COMMUNITY): Payer: Self-pay | Admitting: Internal Medicine

## 2021-03-07 VITALS — BP 162/70 | HR 81 | Wt 204.4 lb

## 2021-03-07 DIAGNOSIS — F1290 Cannabis use, unspecified, uncomplicated: Secondary | ICD-10-CM | POA: Diagnosis not present

## 2021-03-07 DIAGNOSIS — Z91010 Allergy to peanuts: Secondary | ICD-10-CM | POA: Insufficient documentation

## 2021-03-07 DIAGNOSIS — I110 Hypertensive heart disease with heart failure: Secondary | ICD-10-CM | POA: Insufficient documentation

## 2021-03-07 DIAGNOSIS — F22 Delusional disorders: Secondary | ICD-10-CM | POA: Insufficient documentation

## 2021-03-07 DIAGNOSIS — F23 Brief psychotic disorder: Secondary | ICD-10-CM | POA: Insufficient documentation

## 2021-03-07 DIAGNOSIS — Z87891 Personal history of nicotine dependence: Secondary | ICD-10-CM | POA: Insufficient documentation

## 2021-03-07 DIAGNOSIS — Z91040 Latex allergy status: Secondary | ICD-10-CM | POA: Diagnosis not present

## 2021-03-07 DIAGNOSIS — Z1339 Encounter for screening examination for other mental health and behavioral disorders: Secondary | ICD-10-CM | POA: Insufficient documentation

## 2021-03-07 DIAGNOSIS — Z9981 Dependence on supplemental oxygen: Secondary | ICD-10-CM | POA: Insufficient documentation

## 2021-03-07 DIAGNOSIS — I10 Essential (primary) hypertension: Secondary | ICD-10-CM | POA: Diagnosis not present

## 2021-03-07 DIAGNOSIS — Z7901 Long term (current) use of anticoagulants: Secondary | ICD-10-CM | POA: Insufficient documentation

## 2021-03-07 DIAGNOSIS — Z79899 Other long term (current) drug therapy: Secondary | ICD-10-CM | POA: Insufficient documentation

## 2021-03-07 DIAGNOSIS — I5023 Acute on chronic systolic (congestive) heart failure: Secondary | ICD-10-CM | POA: Insufficient documentation

## 2021-03-07 DIAGNOSIS — I272 Pulmonary hypertension, unspecified: Secondary | ICD-10-CM | POA: Insufficient documentation

## 2021-03-07 DIAGNOSIS — F600 Paranoid personality disorder: Secondary | ICD-10-CM | POA: Insufficient documentation

## 2021-03-07 DIAGNOSIS — Z993 Dependence on wheelchair: Secondary | ICD-10-CM | POA: Insufficient documentation

## 2021-03-07 DIAGNOSIS — I428 Other cardiomyopathies: Secondary | ICD-10-CM | POA: Insufficient documentation

## 2021-03-07 DIAGNOSIS — Z20822 Contact with and (suspected) exposure to covid-19: Secondary | ICD-10-CM | POA: Insufficient documentation

## 2021-03-07 DIAGNOSIS — F29 Unspecified psychosis not due to a substance or known physiological condition: Secondary | ICD-10-CM | POA: Insufficient documentation

## 2021-03-07 DIAGNOSIS — Z6841 Body Mass Index (BMI) 40.0 and over, adult: Secondary | ICD-10-CM | POA: Insufficient documentation

## 2021-03-07 DIAGNOSIS — J9610 Chronic respiratory failure, unspecified whether with hypoxia or hypercapnia: Secondary | ICD-10-CM | POA: Insufficient documentation

## 2021-03-07 DIAGNOSIS — Z7982 Long term (current) use of aspirin: Secondary | ICD-10-CM | POA: Insufficient documentation

## 2021-03-07 DIAGNOSIS — I5022 Chronic systolic (congestive) heart failure: Secondary | ICD-10-CM | POA: Insufficient documentation

## 2021-03-07 DIAGNOSIS — N189 Chronic kidney disease, unspecified: Secondary | ICD-10-CM | POA: Insufficient documentation

## 2021-03-07 DIAGNOSIS — F309 Manic episode, unspecified: Secondary | ICD-10-CM | POA: Diagnosis not present

## 2021-03-07 DIAGNOSIS — F3010 Manic episode without psychotic symptoms, unspecified: Secondary | ICD-10-CM

## 2021-03-07 DIAGNOSIS — Q780 Osteogenesis imperfecta: Secondary | ICD-10-CM

## 2021-03-07 DIAGNOSIS — F19959 Other psychoactive substance use, unspecified with psychoactive substance-induced psychotic disorder, unspecified: Secondary | ICD-10-CM | POA: Insufficient documentation

## 2021-03-07 DIAGNOSIS — Z008 Encounter for other general examination: Secondary | ICD-10-CM

## 2021-03-07 DIAGNOSIS — I6782 Cerebral ischemia: Secondary | ICD-10-CM | POA: Diagnosis not present

## 2021-03-07 DIAGNOSIS — G4733 Obstructive sleep apnea (adult) (pediatric): Secondary | ICD-10-CM | POA: Insufficient documentation

## 2021-03-07 DIAGNOSIS — I130 Hypertensive heart and chronic kidney disease with heart failure and stage 1 through stage 4 chronic kidney disease, or unspecified chronic kidney disease: Secondary | ICD-10-CM | POA: Insufficient documentation

## 2021-03-07 LAB — CBC WITH DIFFERENTIAL/PLATELET
Abs Immature Granulocytes: 0.01 10*3/uL (ref 0.00–0.07)
Basophils Absolute: 0 10*3/uL (ref 0.0–0.1)
Basophils Relative: 0 %
Eosinophils Absolute: 0.1 10*3/uL (ref 0.0–0.5)
Eosinophils Relative: 1 %
HCT: 37.4 % — ABNORMAL LOW (ref 39.0–52.0)
Hemoglobin: 11.4 g/dL — ABNORMAL LOW (ref 13.0–17.0)
Immature Granulocytes: 0 %
Lymphocytes Relative: 19 %
Lymphs Abs: 1.4 10*3/uL (ref 0.7–4.0)
MCH: 29.5 pg (ref 26.0–34.0)
MCHC: 30.5 g/dL (ref 30.0–36.0)
MCV: 96.6 fL (ref 80.0–100.0)
Monocytes Absolute: 0.9 10*3/uL (ref 0.1–1.0)
Monocytes Relative: 12 %
Neutro Abs: 5 10*3/uL (ref 1.7–7.7)
Neutrophils Relative %: 68 %
Platelets: 227 10*3/uL (ref 150–400)
RBC: 3.87 MIL/uL — ABNORMAL LOW (ref 4.22–5.81)
RDW: 11.9 % (ref 11.5–15.5)
WBC: 7.3 10*3/uL (ref 4.0–10.5)
nRBC: 0 % (ref 0.0–0.2)

## 2021-03-07 LAB — COMPREHENSIVE METABOLIC PANEL
ALT: 24 U/L (ref 0–44)
AST: 29 U/L (ref 15–41)
Albumin: 2.6 g/dL — ABNORMAL LOW (ref 3.5–5.0)
Alkaline Phosphatase: 56 U/L (ref 38–126)
Anion gap: 6 (ref 5–15)
BUN: 36 mg/dL — ABNORMAL HIGH (ref 6–20)
CO2: 23 mmol/L (ref 22–32)
Calcium: 8.3 mg/dL — ABNORMAL LOW (ref 8.9–10.3)
Chloride: 108 mmol/L (ref 98–111)
Creatinine, Ser: 2.52 mg/dL — ABNORMAL HIGH (ref 0.61–1.24)
GFR, Estimated: 30 mL/min — ABNORMAL LOW (ref >60)
Glucose, Bld: 87 mg/dL (ref 70–99)
Potassium: 3.4 mmol/L — ABNORMAL LOW (ref 3.5–5.1)
Sodium: 137 mmol/L (ref 135–145)
Total Bilirubin: 0.9 mg/dL (ref 0.3–1.2)
Total Protein: 6.1 g/dL — ABNORMAL LOW (ref 6.5–8.1)

## 2021-03-07 LAB — RAPID URINE DRUG SCREEN, HOSP PERFORMED
Amphetamines: NOT DETECTED
Barbiturates: NOT DETECTED
Benzodiazepines: NOT DETECTED
Cocaine: NOT DETECTED
Opiates: NOT DETECTED
Tetrahydrocannabinol: POSITIVE — AB

## 2021-03-07 LAB — RESP PANEL BY RT-PCR (FLU A&B, COVID) ARPGX2
Influenza A by PCR: NEGATIVE
Influenza B by PCR: NEGATIVE
SARS Coronavirus 2 by RT PCR: NEGATIVE

## 2021-03-07 LAB — ACETAMINOPHEN LEVEL: Acetaminophen (Tylenol), Serum: <10 ug/mL — ABNORMAL LOW (ref 10–30)

## 2021-03-07 LAB — ETHANOL: Alcohol, Ethyl (B): <10 mg/dL (ref <10)

## 2021-03-07 LAB — SALICYLATE LEVEL: Salicylate Lvl: <7 mg/dL — ABNORMAL LOW (ref 7.0–30.0)

## 2021-03-07 MED ORDER — FUROSEMIDE 20 MG PO TABS: 80.0000 mg | ORAL_TABLET | Freq: Every evening | ORAL | Status: DC | PRN

## 2021-03-07 MED ORDER — DOXAZOSIN MESYLATE 1 MG PO TABS
1.0000 mg | ORAL_TABLET | Freq: Every day | ORAL | Status: DC
  Administered 2021-03-07: 1 mg via ORAL
  Filled 2021-03-07 (×2): qty 1

## 2021-03-07 MED ORDER — OLANZAPINE 5 MG PO TBDP
5.0000 mg | ORAL_TABLET | Freq: Every day | ORAL | Status: DC
  Administered 2021-03-07: 5 mg via ORAL
  Filled 2021-03-07: qty 1

## 2021-03-07 MED ORDER — ASPIRIN EC 81 MG PO TBEC
81.0000 mg | DELAYED_RELEASE_TABLET | Freq: Every day | ORAL | Status: DC
  Administered 2021-03-07 – 2021-03-08 (×2): 81 mg via ORAL
  Filled 2021-03-07 (×2): qty 1

## 2021-03-07 MED ORDER — HYDRALAZINE HCL 25 MG PO TABS
100.0000 mg | ORAL_TABLET | Freq: Three times a day (TID) | ORAL | Status: DC
  Administered 2021-03-07 – 2021-03-08 (×3): 100 mg via ORAL
  Filled 2021-03-07 (×3): qty 4

## 2021-03-07 MED ORDER — SACUBITRIL-VALSARTAN 97-103 MG PO TABS
1.0000 | ORAL_TABLET | Freq: Two times a day (BID) | ORAL | Status: DC
  Administered 2021-03-07 – 2021-03-08 (×2): 1 via ORAL
  Filled 2021-03-07 (×3): qty 1

## 2021-03-07 MED ORDER — NITROGLYCERIN 0.4 MG SL SUBL: 0.4000 mg | SUBLINGUAL_TABLET | SUBLINGUAL | Status: DC | PRN

## 2021-03-07 MED ORDER — FUROSEMIDE 20 MG PO TABS
40.0000 mg | ORAL_TABLET | Freq: Every morning | ORAL | Status: DC
  Administered 2021-03-08: 40 mg via ORAL
  Filled 2021-03-07: qty 2

## 2021-03-07 MED ORDER — ISOSORBIDE MONONITRATE ER 30 MG PO TB24
30.0000 mg | ORAL_TABLET | Freq: Every day | ORAL | Status: DC
  Administered 2021-03-07 – 2021-03-08 (×2): 30 mg via ORAL
  Filled 2021-03-07 (×2): qty 1

## 2021-03-07 MED ORDER — POTASSIUM CHLORIDE CRYS ER 20 MEQ PO TBCR
20.0000 meq | EXTENDED_RELEASE_TABLET | Freq: Every day | ORAL | Status: DC
  Administered 2021-03-07 – 2021-03-08 (×2): 20 meq via ORAL
  Filled 2021-03-07 (×2): qty 1

## 2021-03-07 MED ORDER — CARVEDILOL 12.5 MG PO TABS
12.5000 mg | ORAL_TABLET | Freq: Two times a day (BID) | ORAL | Status: DC
  Administered 2021-03-07 – 2021-03-08 (×2): 12.5 mg via ORAL
  Filled 2021-03-07 (×2): qty 1
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
## 2021-03-07 NOTE — ED Provider Notes (Signed)
Patient was escorted to the heat heat by [name] from the heart failure clinic.  [name] is concerned the patient does have exhibiting elements of psychosis.  Patient with paranoia, flight of ideas, and delusions.  Patient is pleasant.  He is exhibiting paranoia, delusions, and is speaking with pressured speech.  Patient requires psychiatric evaluation.  IVC hold initiated by myself.  Patient placed into ED triage and put into room 5.  Staff is aware that he is an elopement risk.    Valarie Merino, MD 03/07/21 1106
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
## 2021-03-07 NOTE — ED Triage Notes (Signed)
Pt arrvied POV from a visit in the hospital. Pt is IVC'd for flight of ideas, not making sense with thoughts. PT denies SI or HI.

## 2021-03-07 NOTE — ED Notes (Signed)
TTS at bedside. 

## 2021-03-08 DIAGNOSIS — F600 Paranoid personality disorder: Secondary | ICD-10-CM | POA: Diagnosis not present

## 2021-03-08 DIAGNOSIS — Z008 Encounter for other general examination: Secondary | ICD-10-CM

## 2021-03-08 NOTE — ED Notes (Addendum)
Dc Instructions reviewed with pt. Pt verbalized understanding. Pt DC.   IVC  rescinding paperwork signed by Dr. Johnney Killian and provided to Dee-Secretary

## 2021-03-08 NOTE — Discharge Instructions (Signed)
[facility]-will provide timely access to mental health services for children and adolescents (4-17) and adults presenting in a mental health crisis. The program is designed for those who need urgent Behavioral Health or Substance Use treatment and are not experiencing a medical crisis that would typically require an emergency room visit.    [address] [place], [place] [address] Phone: [phone] [email or web address]   The [facility] will also offer the following outpatient services: (Monday through Friday 8am-5pm)    Partial Hospitalization Program (PHP)  Substance Abuse Intensive Outpatient Program (SA-IOP)  Group Therapy  Medication Management  Peer Living Room   We also provide (24/7):    Assessments: Our mental health clinician and providers will conduct a focused mental health evaluation, assessing for immediate safety concerns and further mental health needs.   Referral: Our team will provide resources and help connect to community based mental health treatment, when indicated, including psychotherapy, psychiatry, and other specialized behavioral health or substance use disorder services (for those not already in treatment).   Transitional Care: Our team providers in person bridging and/or telphonic follow-up during the patient's transition to outpatient services.      

## 2021-03-08 NOTE — ED Notes (Signed)
Pt's belongings inventoried and placed in [place] locker #7.

## 2021-03-08 NOTE — ED Notes (Signed)
Pt and his wheelchair from home transported to [facility]. Sitter remaining with pt

## 2021-03-08 NOTE — ED Notes (Signed)
Message sent to pharmacy regarding needing entresto tubed to ED
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
## 2021-03-08 NOTE — Consult Note (Signed)
Reached out to Jerry Edwards to completed psychiatry reassessment.  Was told they are busy and cannot set up the tts cart at this time.

## 2021-03-08 NOTE — ED Notes (Signed)
Pt valuable (cell phone) placed in valuables folder and given to security

## 2021-03-08 NOTE — ED Notes (Signed)
Sandwich bag given to pt per request

## 2021-03-08 NOTE — Progress Notes (Signed)
CSW provided the following resource for the patient utilize upon discharge:  [facility] provide timely access to mental health services for children and adolescents (4-17) and adults presenting in a mental health crisis. The program is designed for those who need urgent Behavioral Health or Substance Use treatment and are not experiencing a medical crisis that would typically require an emergency room visit.    [place], [place] [address] Phone: [phone] [email or web address]   [facility] will also offer the following outpatient services: (Monday through Friday 8am-5pm)   Partial Hospitalization Program (PHP) Substance Abuse Intensive Outpatient Program (SA-IOP) Group Therapy Medication Management Peer Living Room   We also provide (24/7):    Assessments: Our mental health clinician and providers will conduct a focused mental health evaluation, assessing for immediate safety concerns and further mental health needs.   Referral: Our team will provide resources and help connect to community based mental health treatment, when indicated, including psychotherapy, psychiatry, and other specialized behavioral health or substance use disorder services (for those not already in treatment).   Transitional Care: Our team providers in person bridging and/or telphonic follow-up during the patient's transition to outpatient services.       Glennie Isle, MSW, [employer], LCAS-A Phone: [phone] Disposition/TOC

## 2021-03-08 NOTE — Consult Note (Signed)
Send second request to see patient via TTS for psychiatry reassessment.  Per Sammuel Bailiff, RN she is no longer caring for patient and added Oretha Caprice, RN to chat for awareness. Will continue to reach out to see patient.

## 2021-03-19 DIAGNOSIS — G4733 Obstructive sleep apnea (adult) (pediatric): Secondary | ICD-10-CM | POA: Diagnosis not present

## 2021-03-19 DIAGNOSIS — R062 Wheezing: Secondary | ICD-10-CM | POA: Diagnosis not present

## 2021-03-19 DIAGNOSIS — I5042 Chronic combined systolic (congestive) and diastolic (congestive) heart failure: Secondary | ICD-10-CM | POA: Diagnosis not present

## 2021-03-19 DIAGNOSIS — I110 Hypertensive heart disease with heart failure: Secondary | ICD-10-CM | POA: Diagnosis not present

## 2021-03-23 ENCOUNTER — Other Ambulatory Visit (HOSPITAL_COMMUNITY): Payer: Self-pay | Admitting: Internal Medicine

## 2021-04-04 DIAGNOSIS — G4733 Obstructive sleep apnea (adult) (pediatric): Secondary | ICD-10-CM | POA: Diagnosis not present

## 2021-04-19 DIAGNOSIS — G4733 Obstructive sleep apnea (adult) (pediatric): Secondary | ICD-10-CM | POA: Diagnosis not present

## 2021-04-19 DIAGNOSIS — I5042 Chronic combined systolic (congestive) and diastolic (congestive) heart failure: Secondary | ICD-10-CM | POA: Diagnosis not present

## 2021-04-19 DIAGNOSIS — R062 Wheezing: Secondary | ICD-10-CM | POA: Diagnosis not present

## 2021-04-19 DIAGNOSIS — I110 Hypertensive heart disease with heart failure: Secondary | ICD-10-CM | POA: Diagnosis not present

## 2021-05-01 DIAGNOSIS — Z48810 Encounter for surgical aftercare following surgery on the sense organs: Secondary | ICD-10-CM | POA: Diagnosis not present

## 2021-05-01 DIAGNOSIS — H02101 Unspecified ectropion of right upper eyelid: Secondary | ICD-10-CM | POA: Diagnosis not present

## 2021-05-01 DIAGNOSIS — Z9889 Other specified postprocedural states: Secondary | ICD-10-CM | POA: Diagnosis not present

## 2021-05-01 DIAGNOSIS — H0259 Other disorders affecting eyelid function: Secondary | ICD-10-CM | POA: Diagnosis not present

## 2021-05-02 ENCOUNTER — Other Ambulatory Visit (HOSPITAL_COMMUNITY): Payer: Self-pay | Admitting: Internal Medicine

## 2021-05-10 ENCOUNTER — Emergency Department (HOSPITAL_COMMUNITY): Payer: Medicare Other

## 2021-05-10 ENCOUNTER — Other Ambulatory Visit: Payer: Self-pay

## 2021-05-10 ENCOUNTER — Emergency Department (HOSPITAL_COMMUNITY)
Admission: EM | Admit: 2021-05-10 | Discharge: 2021-05-11 | Disposition: A | Discharge status: Home / Self Care | Payer: Medicare Other | Attending: Emergency Medicine | Admitting: Emergency Medicine

## 2021-05-10 DIAGNOSIS — R0602 Shortness of breath: Secondary | ICD-10-CM

## 2021-05-10 DIAGNOSIS — I110 Hypertensive heart disease with heart failure: Secondary | ICD-10-CM | POA: Insufficient documentation

## 2021-05-10 DIAGNOSIS — Z79899 Other long term (current) drug therapy: Secondary | ICD-10-CM | POA: Diagnosis not present

## 2021-05-10 DIAGNOSIS — R079 Chest pain, unspecified: Secondary | ICD-10-CM | POA: Diagnosis not present

## 2021-05-10 DIAGNOSIS — Z91040 Latex allergy status: Secondary | ICD-10-CM | POA: Insufficient documentation

## 2021-05-10 DIAGNOSIS — R0789 Other chest pain: Secondary | ICD-10-CM | POA: Diagnosis present

## 2021-05-10 DIAGNOSIS — Z91010 Allergy to peanuts: Secondary | ICD-10-CM | POA: Diagnosis not present

## 2021-05-10 DIAGNOSIS — Z7982 Long term (current) use of aspirin: Secondary | ICD-10-CM | POA: Insufficient documentation

## 2021-05-10 DIAGNOSIS — I509 Heart failure, unspecified: Secondary | ICD-10-CM | POA: Diagnosis not present

## 2021-05-10 DIAGNOSIS — I5023 Acute on chronic systolic (congestive) heart failure: Secondary | ICD-10-CM | POA: Insufficient documentation

## 2021-05-10 LAB — CBC
HCT: 37 % — ABNORMAL LOW (ref 39.0–52.0)
Hemoglobin: 11 g/dL — ABNORMAL LOW (ref 13.0–17.0)
MCH: 29.2 pg (ref 26.0–34.0)
MCHC: 29.7 g/dL — ABNORMAL LOW (ref 30.0–36.0)
MCV: 98.1 fL (ref 80.0–100.0)
Platelets: 200 10*3/uL (ref 150–400)
RBC: 3.77 MIL/uL — ABNORMAL LOW (ref 4.22–5.81)
RDW: 12.1 % (ref 11.5–15.5)
WBC: 5.9 10*3/uL (ref 4.0–10.5)
nRBC: 0 % (ref 0.0–0.2)

## 2021-05-10 LAB — BASIC METABOLIC PANEL
Anion gap: 8 (ref 5–15)
BUN: 45 mg/dL — ABNORMAL HIGH (ref 6–20)
CO2: 26 mmol/L (ref 22–32)
Calcium: 8.6 mg/dL — ABNORMAL LOW (ref 8.9–10.3)
Chloride: 110 mmol/L (ref 98–111)
Creatinine, Ser: 2.96 mg/dL — ABNORMAL HIGH (ref 0.61–1.24)
GFR, Estimated: 25 mL/min — ABNORMAL LOW (ref >60)
Glucose, Bld: 97 mg/dL (ref 70–99)
Potassium: 3.9 mmol/L (ref 3.5–5.1)
Sodium: 144 mmol/L (ref 135–145)

## 2021-05-10 LAB — TROPONIN I (HIGH SENSITIVITY): Troponin I (High Sensitivity): 49 ng/L — ABNORMAL HIGH (ref <18)

## 2021-05-10 MED ORDER — FUROSEMIDE 10 MG/ML IJ SOLN
80.0000 mg | Freq: Once | INTRAMUSCULAR | Status: AC
  Administered 2021-05-11: 80 mg via INTRAVENOUS
  Filled 2021-05-10: qty 8

## 2021-05-10 NOTE — ED Triage Notes (Signed)
Pt c/o chest tightness and shortness of breath. Pt has a history of CHF. Pt is on home oxygen for the past 2 days for his shortness of breath.
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
## 2021-05-11 DIAGNOSIS — I5023 Acute on chronic systolic (congestive) heart failure: Secondary | ICD-10-CM | POA: Diagnosis not present

## 2021-05-11 LAB — TROPONIN I (HIGH SENSITIVITY): Troponin I (High Sensitivity): 47 ng/L — ABNORMAL HIGH (ref <18)

## 2021-05-11 NOTE — Discharge Instructions (Signed)
You were seen today for shortness of breath.  It appears that you likely have some mild volume overload and CHF.  Use your oxygen as needed at home.  Increase Lasix to 80 mg twice daily for the next 3 days.  Follow-up with cardiology very closely.  If you note any new or worsening symptoms, you should be reevaluated.

## 2021-05-14 ENCOUNTER — Encounter (HOSPITAL_COMMUNITY): Payer: Medicare Other | Admitting: Internal Medicine

## 2021-05-20 ENCOUNTER — Telehealth (HOSPITAL_COMMUNITY): Payer: Self-pay

## 2021-05-20 DIAGNOSIS — I110 Hypertensive heart disease with heart failure: Secondary | ICD-10-CM | POA: Diagnosis not present

## 2021-05-20 DIAGNOSIS — G4733 Obstructive sleep apnea (adult) (pediatric): Secondary | ICD-10-CM | POA: Diagnosis not present

## 2021-05-20 DIAGNOSIS — I5042 Chronic combined systolic (congestive) and diastolic (congestive) heart failure: Secondary | ICD-10-CM | POA: Diagnosis not present

## 2021-05-20 DIAGNOSIS — R062 Wheezing: Secondary | ICD-10-CM | POA: Diagnosis not present

## 2021-05-20 NOTE — Progress Notes (Signed)
Patient ID: Jerry Edwards, male   DOB: [birth year], 51 y.o.   MRN: [id number]   [facility]     Primary Physician: [name], NP Primary Cardiologist: [name] HF: [name]   HPI: Jerry Edwards is a 51 y.o. male with history of NICM, Chronic systolic heart failure, HTN, OSA, and osteogenic imperfecta.   Admitted 1/15 with HF symptoms. ECHO 04/08/2013. EF 25%. cMRI 12/15 EF 38% possible infiltrative disease with discrete areas of non-coronary pattern delayed enhancement. Coronaries were normal on LHC.  SPEP no M spike and UPEP no free light chains.   Admitted 1/17 with recurrent HF, severe HTN and hypoxemia. Diuresed and discharged home.   Admitted 12/17 with acute respiratory failure and HTN crisis after stopping several meds.   Admitted 7/18 with atypical CP. Echo and Stress test as below.    PYP scan 8/18 with ratio of 1.2 (felt to be negative) - unable to have MRI (couldnt fit)  Admitted 6/19 with ADHF. Echo showed lower EF 25-30%. HF medications optimized. DC weight: 216 lbs  Sleep study 8/19 AHI 88/hr  Follow up 3/22  doing well, volume ok, stable NYHA II symptoms. [name] restarted.  Admitted 11/22 for CP, cards consulted and cardiac work up reassuring. Echo showed EF 35-40%. Hstrop 42-> 38  Se[facility]/14/21 at [facility] for psychotic episode. He became combative and left AMA.  Follow up 12/22 he was acutely manic, taken to ED for psych evaluation.  Seen in ED a couple weeks ago for SOB, given Lasix 80 mg IV and instructed to increase Lasix to 80 bid x 3 days.  Today he returns for HF follow up. He feels good. He is working out at a [employer] and working on his diet limiting red meat and dairy. 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 No SOB at the gym or getting around the house, mainly uses wheelchair. Denies palpitations, CP, dizziness, edema, or PND/Orthopnea. Appetite ok. No fever or chills. Does not weigh regularly. Taking all medications.      Cardiac studies: Echo 11/22 EF 35-40% Echo 10/19 EF 30-35% Echo 09/20/17: EF 25-30%, grade 2 DD Echo 10/13/16 LVEF 35-40%, Grade 1 DD, Mild MR, Mild LAE Echo 1/17 30-35% RV ok  Echo 02/22/16 LVEF 40%, Grade 1 DD.   Myoview 10/15/16 No change in EKG from baseline T wave inversions in the inferolateral leads There is a medium defect of mild severity present in the basal inferior, mid inferior and apical inferior location. The defect is non-reversible and consistent with prior infarct. There is a small defect of moderate severity present in the apex location. The defect is partially reversible and consistent with infarct with possible small area of peri infarct ischemia. This is a high risk study. EF not calculated but visually appears to be moderately to severely reduced.   LHC/RHC 04/08/13  Normal coronaries RA pressure:  39/39 with a mean of 34. Prominent X. and Y. Descends.  RV pressure: 76/26  mean 4 PA pressure: 70/36 with a mean of 51  Pulmonary capillary wedge pressure: 31/30 and mean of 29  LV pressure: 140/26 with a left ventricular end-diastolic pressure of 35  PA sat 66%  AO sat 97%  CO/CI 4.94/2.63  Pulmonary vascular resistance: 4.45 [place] units   PMH: 1. HTN 2. Osteogenesis imperfecta: wheelchair-bound.  History of bone fractures.  3. Suspect OSA 4. CKD 5. Nonischemic cardiomyopathy: Echo (1/15) with EF 20-25%, severe diffuse hypokinesis, mild MR, mildly enlarged RV with  moderately decreased systolic function.  LHC/RHC (1/15) with normal coronaries, mean RA 34, PA 70/36 mean 51, mean PCWP 29, CI 2.63.  Cardiac MRI (1/15) with EF 38%, global hypokinesis, discrete areas of delayed enhancement in the subepicardial mid inferoseptal RV insertion site, the mid wall of the mid inferolateral wall, and the subendocardial mid anterolateral wall.  This pattern was suggestive of infiltrative disease.  UPEP/SPEP negative, HIV negative.  Chest CT to look for evidence of sarcoidosis  did not show any sarcoid-type lesions.    SH:  Single, prior smoking now quit, occasional marijuana, works as Dispensing optician.   FH:  HTN, no significant cardiac disease.   Current Outpatient Medications  Medication Sig Dispense Refill   aspirin 81 MG tablet Take 1 tablet (81 mg total) by mouth daily.     carvedilol (COREG) 12.5 MG tablet Take 1 tablet (12.5 mg total) by mouth 2 (two) times daily with a meal. 180 tablet 3   doxazosin (CARDURA) 1 MG tablet TAKE 1 TABLET BY MOUTH AT BEDTIME (Patient taking differently: Take 1 mg by mouth daily.) 30 tablet 0   ENTRESTO 97-103 MG Take 1 tablet by mouth twice daily (Patient taking differently: Take 1 tablet by mouth 2 (two) times daily.) 60 tablet 0   erythromycin ophthalmic ointment Place 1 application into the right eye at bedtime.     furosemide (LASIX) 80 MG tablet TAKE 1 TABLET BY MOUTH ONCE DAILY IN THE MORNING,  AND THEN TAKE 1/2 (ONE-HALF) TABLET BY MOUTH ONCE DAILY IN THE EVENING (Patient taking differently: Take 40-80 mg by mouth See admin instructions. 80 mg in the morning  40 mg in the evening) 135 tablet 0   hydrALAZINE (APRESOLINE) 100 MG tablet TAKE 1 TABLET BY MOUTH THREE TIMES DAILY (Patient taking differently: Take 100 mg by mouth 3 (three) times daily.) 270 tablet 3   isosorbide mononitrate (IMDUR) 30 MG 24 hr tablet Take 1 tablet (30 mg total) by mouth daily. 90 tablet 3   naproxen sodium (ALEVE) 220 MG tablet Take 440 mg by mouth daily as needed (pain).     Olopatadine HCl (PATADAY OP) Place 1 drop into both eyes in the morning and at bedtime.     OXYGEN Inhale 2 L into the lungs as needed (shortness of breath).     potassium chloride SA (KLOR-CON) 20 MEQ tablet Take 1 tablet (20 mEq total) by mouth daily. 30 tablet 6   nitroGLYCERIN (NITROSTAT) 0.4 MG SL tablet Place 1 tablet (0.4 mg total) under the tongue every 5 (five) minutes x 3 doses as needed for chest pain. (Patient not taking: Reported on 03/08/2021) 25 tablet 12   No current  facility-administered medications for this encounter.   PHYSICAL EXAM: BP 138/88    Pulse 65    SpO2 90%   Wt Readings from Last 3 Encounters:  05/10/21 81.6 kg (180 lb)  03/07/21 92.7 kg (204 lb 6.4 oz)  02/09/21 88.5 kg (195 lb)    General:  NAD. No resp difficulty, arrived in [facility], short stature HEENT: Swollen right eyelid Neck: Supple. No JVD. Carotids 2+ bilat; no bruits. No lymphadenopathy or thryomegaly appreciated. Cor: PMI nondisplaced. Regular rate & rhythm. No rubs, gallops or murmurs. Lungs: Clear Abdomen: Soft, nontender, nondistended. No hepatosplenomegaly. No bruits or masses. Good bowel sounds. Extremities: No cyanosis, clubbing, rash, edema Neuro: Alert & oriented x 3, cranial nerves grossly intact. Moves all 4 extremities w/o difficulty. Affect pleasant.   ASSESSMENT & PLAN: 1. Chronic Systolic Heart  Failure. NICM EF 25% with no coronary disease on LHC in 1/15. 03/2013 cMRI possible infiltrative disease. SPEP and UPEP ok.  - EF 10/13/2016 35-40%. Myoview high risk due to LV dysfunction with mild inferior scar  - Based on the cMRI pattern 12/15 and concern for cardiac sarcoidosis, a chest CT was done to look for evidence of pulmonary sarcoidosis.  This was not indicative of pulmonary sarcoidosis.  Patient has had no significant arrhythmias detected.  QRS on ECG is not widened.  - Pt unable to fit in cMRI machine due to body stature.  PYP scan 8/18 no amyloid - Echo 09/2017: EF 25-30%, grade 2 DD - Echo 10/19: EF  30-35% - Echo 5/21 EF 35% - Echo 11/22 35-40% - Stable NYHA II. Volume status looks good. - Start Jardiance 10 mg daily. - Decrease Lasix to 40 mg bid. - Continue Coreg 12.5 mg bid. - Continue Entresto 97/103 mg bid. - Continue hydralazine 100 mg tid + imdur 30 mg daily.   - Has been off spiro for unclear reasons. Restarted last visit but now off again. Re-start next. - Labs today.  2. Osteogenesis imperfecta  - Wheel chair bound; limited mobility.  - No  change   3. HTN - BP mildly elevated. - Titrate GDMT  4. OSA  - Wears O2 at night.  - Had PSG on 11/07/17 with AHI 88/hr c/w very severe OSA.  - Follows with Dr. Radford Pax, reports CPAP compliance.  5. Morbid Obesity  - Congratulated on recent weight loss. - Reinforced need to lose weight  6. Pulmonary hypertension - This appears to be primarily pulmonary venous hypertension on previous RHC.  There may be a component of PAH related to OSA.   - No change.   7. Chronic respiratory failure - Continue O2. Encouraged him to wear at all times.  - On CPAP.  - Encouraged weight loss.    8. H/o psychosis - 12/22 acutely psychotic in clinic --> transferred to ED.  - Seen by Psych, received olanzapine x 1 dose. Felt lack of sleep and ? CBD-tea contributed to symptoms. - Discharged with outpatient counseling therapy. - Not currently on any mood stabilizers. Appropriate affect and behavior today.  Follow up in 4-6 weeks with APP (add back spiro) and 3 months with [name]  Rafael Bihari, FNP  11:18 AM

## 2021-05-20 NOTE — Telephone Encounter (Signed)
Called to confirm/remind patient of their appointment at the [facility] on 05/21/21.   Patient reminded to bring all medications and/or complete list.  Confirmed patient has transportation. Gave directions, instructed to utilize [place] parking.  Confirmed appointment prior to ending call.

## 2021-05-21 ENCOUNTER — Other Ambulatory Visit (HOSPITAL_COMMUNITY): Payer: Self-pay

## 2021-05-21 ENCOUNTER — Other Ambulatory Visit: Payer: Self-pay

## 2021-05-21 ENCOUNTER — Ambulatory Visit (HOSPITAL_COMMUNITY)
Admission: RE | Admit: 2021-05-21 | Discharge: 2021-05-21 | Disposition: A | Discharge status: Home / Self Care | Payer: Medicare Other | Source: Ambulatory Visit | Attending: Family Medicine | Admitting: Family Medicine

## 2021-05-21 ENCOUNTER — Encounter (HOSPITAL_COMMUNITY): Payer: Self-pay

## 2021-05-21 VITALS — BP 138/88 | HR 65

## 2021-05-21 DIAGNOSIS — Z993 Dependence on wheelchair: Secondary | ICD-10-CM | POA: Diagnosis not present

## 2021-05-21 DIAGNOSIS — N189 Chronic kidney disease, unspecified: Secondary | ICD-10-CM | POA: Insufficient documentation

## 2021-05-21 DIAGNOSIS — I5022 Chronic systolic (congestive) heart failure: Secondary | ICD-10-CM | POA: Insufficient documentation

## 2021-05-21 DIAGNOSIS — J9610 Chronic respiratory failure, unspecified whether with hypoxia or hypercapnia: Secondary | ICD-10-CM

## 2021-05-21 DIAGNOSIS — G4733 Obstructive sleep apnea (adult) (pediatric): Secondary | ICD-10-CM

## 2021-05-21 DIAGNOSIS — I5023 Acute on chronic systolic (congestive) heart failure: Secondary | ICD-10-CM | POA: Diagnosis not present

## 2021-05-21 DIAGNOSIS — I272 Pulmonary hypertension, unspecified: Secondary | ICD-10-CM | POA: Diagnosis not present

## 2021-05-21 DIAGNOSIS — I130 Hypertensive heart and chronic kidney disease with heart failure and stage 1 through stage 4 chronic kidney disease, or unspecified chronic kidney disease: Secondary | ICD-10-CM | POA: Diagnosis not present

## 2021-05-21 DIAGNOSIS — Q780 Osteogenesis imperfecta: Secondary | ICD-10-CM | POA: Diagnosis not present

## 2021-05-21 DIAGNOSIS — I10 Essential (primary) hypertension: Secondary | ICD-10-CM

## 2021-05-21 DIAGNOSIS — I428 Other cardiomyopathies: Secondary | ICD-10-CM | POA: Insufficient documentation

## 2021-05-21 DIAGNOSIS — Z8659 Personal history of other mental and behavioral disorders: Secondary | ICD-10-CM

## 2021-05-21 DIAGNOSIS — Z79899 Other long term (current) drug therapy: Secondary | ICD-10-CM | POA: Insufficient documentation

## 2021-05-21 LAB — BASIC METABOLIC PANEL
Anion gap: 9 (ref 5–15)
BUN: 42 mg/dL — ABNORMAL HIGH (ref 6–20)
CO2: 28 mmol/L (ref 22–32)
Calcium: 8.6 mg/dL — ABNORMAL LOW (ref 8.9–10.3)
Chloride: 109 mmol/L (ref 98–111)
Creatinine, Ser: 2.9 mg/dL — ABNORMAL HIGH (ref 0.61–1.24)
GFR, Estimated: 25 mL/min — ABNORMAL LOW (ref >60)
Glucose, Bld: 85 mg/dL (ref 70–99)
Potassium: 4.1 mmol/L (ref 3.5–5.1)
Sodium: 146 mmol/L — ABNORMAL HIGH (ref 135–145)

## 2021-05-21 LAB — BRAIN NATRIURETIC PEPTIDE: B Natriuretic Peptide: 1681.3 pg/mL — ABNORMAL HIGH (ref 0.0–100.0)

## 2021-05-21 MED ORDER — EMPAGLIFLOZIN 10 MG PO TABS
10.0000 mg | ORAL_TABLET | Freq: Every day | ORAL | 4 refills | Status: DC
Start: 2021-05-21 — End: 2021-10-20

## 2021-05-21 MED ORDER — FUROSEMIDE 40 MG PO TABS
40.0000 mg | ORAL_TABLET | Freq: Two times a day (BID) | ORAL | 5 refills | Status: DC
Start: 2021-05-21 — End: 2021-06-06

## 2021-05-21 NOTE — Patient Instructions (Signed)
Thank you for coming in today ? ?Labs were done today, if any labs are abnormal the clinic will call you ? ?START Jardiance 10 mg 1 tablet daily  ? ?DECREASE Lasix to 40 mg 1 tablet twice daily  ? ?Your physician recommends that you schedule a follow-up appointment in:  ?4-6 weeks in clinic ?12 weeks with [name] ? ?At the [facility], you and your health needs are our priority. As part of our continuing mission to provide you with exceptional heart care, we have created designated Provider Care Teams. These Care Teams include your primary Cardiologist (physician) and Advanced Practice Providers (APPs- Physician Assistants and Nurse Practitioners) who all work together to provide you with the care you need, when you need it.  ? ?You may see any of the following providers on your designated Care Team at your next follow up: ?Dr Glori Bickers ?Dr Loralie Champagne ?Darrick Grinder, NP ?Lyda Jester, PA ?Jessica Milford,NP ?Marlyce Huge, PA ?Audry Riles, PharmD ? ? ?Please be sure to bring in all your medications bottles to every appointment.  ? ?If you have any questions or concerns before your next appointment please send [place] a message through [place] or call our office at [phone].   ? ?TO LEAVE A MESSAGE FOR THE NURSE SELECT OPTION 2, PLEASE LEAVE A MESSAGE INCLUDING: ?YOUR NAME ?DATE OF BIRTH ?CALL BACK NUMBER ?REASON FOR CALL**this is important as we prioritize the call backs ? ?YOU WILL RECEIVE A CALL BACK THE SAME DAY AS LONG AS YOU CALL BEFORE 4:00 PM ? ?

## 2021-05-30 ENCOUNTER — Other Ambulatory Visit (HOSPITAL_COMMUNITY): Payer: Self-pay | Admitting: Internal Medicine

## 2021-06-02 ENCOUNTER — Other Ambulatory Visit: Payer: Self-pay

## 2021-06-02 ENCOUNTER — Inpatient Hospital Stay (HOSPITAL_COMMUNITY)
Admission: EM | Admit: 2021-06-02 | Discharge: 2021-06-06 | DRG: 286 | Disposition: A | Discharge status: Home / Self Care | Payer: Medicare Other | Attending: Internal Medicine | Admitting: Internal Medicine

## 2021-06-02 ENCOUNTER — Emergency Department (HOSPITAL_COMMUNITY): Payer: Medicare Other

## 2021-06-02 DIAGNOSIS — I428 Other cardiomyopathies: Secondary | ICD-10-CM | POA: Diagnosis present

## 2021-06-02 DIAGNOSIS — R062 Wheezing: Secondary | ICD-10-CM | POA: Diagnosis not present

## 2021-06-02 DIAGNOSIS — J9620 Acute and chronic respiratory failure, unspecified whether with hypoxia or hypercapnia: Secondary | ICD-10-CM | POA: Diagnosis not present

## 2021-06-02 DIAGNOSIS — G4733 Obstructive sleep apnea (adult) (pediatric): Secondary | ICD-10-CM | POA: Diagnosis present

## 2021-06-02 DIAGNOSIS — R0689 Other abnormalities of breathing: Secondary | ICD-10-CM | POA: Diagnosis not present

## 2021-06-02 DIAGNOSIS — Z91010 Allergy to peanuts: Secondary | ICD-10-CM

## 2021-06-02 DIAGNOSIS — I509 Heart failure, unspecified: Secondary | ICD-10-CM | POA: Diagnosis not present

## 2021-06-02 DIAGNOSIS — Z743 Need for continuous supervision: Secondary | ICD-10-CM | POA: Diagnosis not present

## 2021-06-02 DIAGNOSIS — J811 Chronic pulmonary edema: Secondary | ICD-10-CM | POA: Diagnosis not present

## 2021-06-02 DIAGNOSIS — Z7982 Long term (current) use of aspirin: Secondary | ICD-10-CM

## 2021-06-02 DIAGNOSIS — Z20822 Contact with and (suspected) exposure to covid-19: Secondary | ICD-10-CM | POA: Diagnosis not present

## 2021-06-02 DIAGNOSIS — N179 Acute kidney failure, unspecified: Secondary | ICD-10-CM | POA: Diagnosis present

## 2021-06-02 DIAGNOSIS — E1122 Type 2 diabetes mellitus with diabetic chronic kidney disease: Secondary | ICD-10-CM | POA: Diagnosis present

## 2021-06-02 DIAGNOSIS — I10 Essential (primary) hypertension: Secondary | ICD-10-CM | POA: Diagnosis present

## 2021-06-02 DIAGNOSIS — N189 Chronic kidney disease, unspecified: Secondary | ICD-10-CM | POA: Diagnosis present

## 2021-06-02 DIAGNOSIS — Z87891 Personal history of nicotine dependence: Secondary | ICD-10-CM

## 2021-06-02 DIAGNOSIS — Q780 Osteogenesis imperfecta: Secondary | ICD-10-CM

## 2021-06-02 DIAGNOSIS — Z993 Dependence on wheelchair: Secondary | ICD-10-CM | POA: Diagnosis not present

## 2021-06-02 DIAGNOSIS — Z79899 Other long term (current) drug therapy: Secondary | ICD-10-CM

## 2021-06-02 DIAGNOSIS — Z823 Family history of stroke: Secondary | ICD-10-CM

## 2021-06-02 DIAGNOSIS — Z91014 Allergy to mammalian meats: Secondary | ICD-10-CM | POA: Diagnosis not present

## 2021-06-02 DIAGNOSIS — I340 Nonrheumatic mitral (valve) insufficiency: Secondary | ICD-10-CM | POA: Diagnosis present

## 2021-06-02 DIAGNOSIS — N1832 Chronic kidney disease, stage 3b: Secondary | ICD-10-CM | POA: Diagnosis present

## 2021-06-02 DIAGNOSIS — Z91040 Latex allergy status: Secondary | ICD-10-CM | POA: Diagnosis not present

## 2021-06-02 DIAGNOSIS — I5043 Acute on chronic combined systolic (congestive) and diastolic (congestive) heart failure: Secondary | ICD-10-CM | POA: Diagnosis not present

## 2021-06-02 DIAGNOSIS — I2510 Atherosclerotic heart disease of native coronary artery without angina pectoris: Secondary | ICD-10-CM | POA: Diagnosis present

## 2021-06-02 DIAGNOSIS — Z6841 Body Mass Index (BMI) 40.0 and over, adult: Secondary | ICD-10-CM

## 2021-06-02 DIAGNOSIS — Z8249 Family history of ischemic heart disease and other diseases of the circulatory system: Secondary | ICD-10-CM

## 2021-06-02 DIAGNOSIS — Z833 Family history of diabetes mellitus: Secondary | ICD-10-CM

## 2021-06-02 DIAGNOSIS — I161 Hypertensive emergency: Secondary | ICD-10-CM | POA: Diagnosis not present

## 2021-06-02 DIAGNOSIS — Z91013 Allergy to seafood: Secondary | ICD-10-CM

## 2021-06-02 DIAGNOSIS — E119 Type 2 diabetes mellitus without complications: Secondary | ICD-10-CM

## 2021-06-02 DIAGNOSIS — J9621 Acute and chronic respiratory failure with hypoxia: Secondary | ICD-10-CM | POA: Diagnosis present

## 2021-06-02 DIAGNOSIS — I272 Pulmonary hypertension, unspecified: Secondary | ICD-10-CM | POA: Diagnosis not present

## 2021-06-02 DIAGNOSIS — J9601 Acute respiratory failure with hypoxia: Secondary | ICD-10-CM | POA: Diagnosis not present

## 2021-06-02 DIAGNOSIS — I452 Bifascicular block: Secondary | ICD-10-CM | POA: Diagnosis not present

## 2021-06-02 DIAGNOSIS — I5023 Acute on chronic systolic (congestive) heart failure: Secondary | ICD-10-CM | POA: Diagnosis present

## 2021-06-02 DIAGNOSIS — H02103 Unspecified ectropion of right eye, unspecified eyelid: Secondary | ICD-10-CM | POA: Diagnosis not present

## 2021-06-02 DIAGNOSIS — N183 Chronic kidney disease, stage 3 unspecified: Secondary | ICD-10-CM | POA: Diagnosis not present

## 2021-06-02 DIAGNOSIS — I130 Hypertensive heart and chronic kidney disease with heart failure and stage 1 through stage 4 chronic kidney disease, or unspecified chronic kidney disease: Secondary | ICD-10-CM | POA: Diagnosis present

## 2021-06-02 DIAGNOSIS — J810 Acute pulmonary edema: Secondary | ICD-10-CM | POA: Diagnosis not present

## 2021-06-02 DIAGNOSIS — I160 Hypertensive urgency: Secondary | ICD-10-CM | POA: Diagnosis not present

## 2021-06-02 DIAGNOSIS — E66813 Obesity, class 3: Secondary | ICD-10-CM | POA: Diagnosis present

## 2021-06-02 DIAGNOSIS — R6889 Other general symptoms and signs: Secondary | ICD-10-CM | POA: Diagnosis not present

## 2021-06-02 DIAGNOSIS — R0600 Dyspnea, unspecified: Secondary | ICD-10-CM | POA: Diagnosis not present

## 2021-06-02 LAB — BRAIN NATRIURETIC PEPTIDE: B Natriuretic Peptide: >4500 pg/mL — ABNORMAL HIGH (ref 0.0–100.0)

## 2021-06-02 LAB — RESP PANEL BY RT-PCR (FLU A&B, COVID) ARPGX2
Influenza A by PCR: NEGATIVE
Influenza B by PCR: NEGATIVE
SARS Coronavirus 2 by RT PCR: NEGATIVE

## 2021-06-02 LAB — GLUCOSE, CAPILLARY
Glucose-Capillary: 99 mg/dL (ref 70–99)
Glucose-Capillary: 157 mg/dL — ABNORMAL HIGH (ref 70–99)

## 2021-06-02 LAB — TROPONIN I (HIGH SENSITIVITY)
Troponin I (High Sensitivity): 40 ng/L — ABNORMAL HIGH (ref <18)
Troponin I (High Sensitivity): 61 ng/L — ABNORMAL HIGH (ref <18)

## 2021-06-02 LAB — COMPREHENSIVE METABOLIC PANEL
ALT: 23 U/L (ref 0–44)
AST: 30 U/L (ref 15–41)
Albumin: 2.8 g/dL — ABNORMAL LOW (ref 3.5–5.0)
Alkaline Phosphatase: 54 U/L (ref 38–126)
Anion gap: 10 (ref 5–15)
BUN: 32 mg/dL — ABNORMAL HIGH (ref 6–20)
CO2: 25 mmol/L (ref 22–32)
Calcium: 8.6 mg/dL — ABNORMAL LOW (ref 8.9–10.3)
Chloride: 111 mmol/L (ref 98–111)
Creatinine, Ser: 3.15 mg/dL — ABNORMAL HIGH (ref 0.61–1.24)
GFR, Estimated: 23 mL/min — ABNORMAL LOW (ref >60)
Glucose, Bld: 118 mg/dL — ABNORMAL HIGH (ref 70–99)
Potassium: 3.7 mmol/L (ref 3.5–5.1)
Sodium: 146 mmol/L — ABNORMAL HIGH (ref 135–145)
Total Bilirubin: 0.7 mg/dL (ref 0.3–1.2)
Total Protein: 6.7 g/dL (ref 6.5–8.1)

## 2021-06-02 LAB — CBC WITH DIFFERENTIAL/PLATELET
Abs Immature Granulocytes: 0.01 10*3/uL (ref 0.00–0.07)
Basophils Absolute: 0 10*3/uL (ref 0.0–0.1)
Basophils Relative: 0 %
Eosinophils Absolute: 0.1 10*3/uL (ref 0.0–0.5)
Eosinophils Relative: 1 %
HCT: 39.9 % (ref 39.0–52.0)
Hemoglobin: 11.7 g/dL — ABNORMAL LOW (ref 13.0–17.0)
Immature Granulocytes: 0 %
Lymphocytes Relative: 14 %
Lymphs Abs: 0.7 10*3/uL (ref 0.7–4.0)
MCH: 28.8 pg (ref 26.0–34.0)
MCHC: 29.3 g/dL — ABNORMAL LOW (ref 30.0–36.0)
MCV: 98.3 fL (ref 80.0–100.0)
Monocytes Absolute: 0.5 10*3/uL (ref 0.1–1.0)
Monocytes Relative: 10 %
Neutro Abs: 3.5 10*3/uL (ref 1.7–7.7)
Neutrophils Relative %: 75 %
Platelets: 177 10*3/uL (ref 150–400)
RBC: 4.06 MIL/uL — ABNORMAL LOW (ref 4.22–5.81)
RDW: 12 % (ref 11.5–15.5)
WBC: 4.7 10*3/uL (ref 4.0–10.5)
nRBC: 0 % (ref 0.0–0.2)

## 2021-06-02 LAB — MAGNESIUM: Magnesium: 2 mg/dL (ref 1.7–2.4)

## 2021-06-02 MED ORDER — OLOPATADINE HCL 0.1 % OP SOLN
1.0000 [drp] | Freq: Two times a day (BID) | OPHTHALMIC | Status: DC
Start: 2021-06-02 — End: 2021-06-07
  Administered 2021-06-02 – 2021-06-06 (×8): 1 [drp] via OPHTHALMIC
  Filled 2021-06-02: qty 5

## 2021-06-02 MED ORDER — ISOSORBIDE MONONITRATE ER 30 MG PO TB24
30.0000 mg | ORAL_TABLET | Freq: Every day | ORAL | Status: DC
Start: 2021-06-02 — End: 2021-06-02

## 2021-06-02 MED ORDER — SODIUM CHLORIDE 0.9% FLUSH
3.0000 mL | Freq: Two times a day (BID) | INTRAVENOUS | Status: DC
  Administered 2021-06-02 – 2021-06-04 (×3): 3 mL via INTRAVENOUS

## 2021-06-02 MED ORDER — CARVEDILOL 6.25 MG PO TABS
6.2500 mg | ORAL_TABLET | Freq: Two times a day (BID) | ORAL | Status: DC
Start: 2021-06-02 — End: 2021-06-07
  Administered 2021-06-02 – 2021-06-06 (×8): 6.25 mg via ORAL
  Filled 2021-06-02 (×2): qty 1
  Filled 2021-06-02: qty 2
  Filled 2021-06-02 (×3): qty 1
  Filled 2021-06-02: qty 2
  Filled 2021-06-02 (×2): qty 1

## 2021-06-02 MED ORDER — ASPIRIN EC 81 MG PO TBEC
81.0000 mg | DELAYED_RELEASE_TABLET | Freq: Every day | ORAL | Status: AC
  Administered 2021-06-02 – 2021-06-03 (×2): 81 mg via ORAL
  Filled 2021-06-02 (×2): qty 1

## 2021-06-02 MED ORDER — SODIUM CHLORIDE 0.9 % IV SOLN: 250.0000 mL | INTRAVENOUS | Status: DC | PRN

## 2021-06-02 MED ORDER — FUROSEMIDE 10 MG/ML IJ SOLN
80.0000 mg | Freq: Once | INTRAMUSCULAR | Status: AC
Start: 2021-06-02 — End: 2021-06-02
  Administered 2021-06-02: 80 mg via INTRAVENOUS
  Filled 2021-06-02: qty 8

## 2021-06-02 MED ORDER — SODIUM CHLORIDE 0.9% FLUSH: 3.0000 mL | INTRAVENOUS | Status: DC | PRN

## 2021-06-02 MED ORDER — EMPAGLIFLOZIN 10 MG PO TABS
10.0000 mg | ORAL_TABLET | Freq: Every day | ORAL | Status: DC
Start: 2021-06-03 — End: 2021-06-02

## 2021-06-02 MED ORDER — POTASSIUM CHLORIDE 20 MEQ PO PACK
40.0000 meq | PACK | Freq: Once | ORAL | Status: AC
  Administered 2021-06-02: 40 meq via ORAL
  Filled 2021-06-02: qty 2

## 2021-06-02 MED ORDER — ACETAMINOPHEN 325 MG PO TABS: 650.0000 mg | ORAL_TABLET | ORAL | Status: DC | PRN

## 2021-06-02 MED ORDER — FUROSEMIDE 10 MG/ML IJ SOLN
80.0000 mg | Freq: Two times a day (BID) | INTRAMUSCULAR | Status: DC
  Administered 2021-06-02 – 2021-06-06 (×8): 80 mg via INTRAVENOUS
  Filled 2021-06-02 (×8): qty 8

## 2021-06-02 MED ORDER — ERYTHROMYCIN 5 MG/GM OP OINT
1.0000 "application " | TOPICAL_OINTMENT | Freq: Every day | OPHTHALMIC | Status: DC
  Administered 2021-06-02 – 2021-06-05 (×4): 1 via OPHTHALMIC
  Filled 2021-06-02: qty 3.5

## 2021-06-02 MED ORDER — NITROGLYCERIN IN D5W 200-5 MCG/ML-% IV SOLN
0.0000 ug/min | INTRAVENOUS | Status: DC
  Administered 2021-06-02: 10 ug/min via INTRAVENOUS
  Filled 2021-06-02: qty 250

## 2021-06-02 MED ORDER — SACUBITRIL-VALSARTAN 97-103 MG PO TABS: 1.0000 | ORAL_TABLET | Freq: Two times a day (BID) | ORAL | Status: DC

## 2021-06-02 MED ORDER — ONDANSETRON HCL 4 MG/2ML IJ SOLN: 4.0000 mg | Freq: Four times a day (QID) | INTRAMUSCULAR | Status: DC | PRN

## 2021-06-02 MED ORDER — CARVEDILOL 12.5 MG PO TABS
12.5000 mg | ORAL_TABLET | Freq: Two times a day (BID) | ORAL | Status: DC
Start: 2021-06-02 — End: 2021-06-02

## 2021-06-02 MED ORDER — HYDRALAZINE HCL 50 MG PO TABS
100.0000 mg | ORAL_TABLET | Freq: Three times a day (TID) | ORAL | Status: DC
  Administered 2021-06-02 – 2021-06-04 (×8): 100 mg via ORAL
  Filled 2021-06-02 (×8): qty 2

## 2021-06-02 MED ORDER — INSULIN ASPART 100 UNIT/ML IJ SOLN: 0.0000 [IU] | Freq: Three times a day (TID) | INTRAMUSCULAR | Status: DC

## 2021-06-02 MED ORDER — DOXAZOSIN MESYLATE 1 MG PO TABS
1.0000 mg | ORAL_TABLET | Freq: Every day | ORAL | Status: DC
  Administered 2021-06-02 – 2021-06-05 (×4): 1 mg via ORAL
  Filled 2021-06-02 (×5): qty 1
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
## 2021-06-02 NOTE — ED Triage Notes (Signed)
Pt from home in respiratory distress-has had two episodes of sudden onset sob over the last two days, yesterday resolving after 15 mins but today not improving. Seen for CHF exacerbation last week and lasix dosage was increased. O2 sats 70% room air, and placed on CPAP by EMS who gave 10 mg albuterol, 25 mg atrovent, 125 mg solumedrol, 2 nitro. Pt not having chest pain but was hypertensive >774 systolic. Wears CPAP at home to sleep, wears supplemental O2 intermittently.  ?
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
## 2021-06-02 NOTE — ED Provider Notes (Incomplete)
[place] EMERGENCY DEPARTMENT Provider Note   CSN: [id number] Arrival date & time: 06/02/21  1206     History {Add pertinent medical, surgical, social history, OB history to HPI:1} Chief Complaint  Patient presents with   Respiratory Distress    Jerry Edwards is a 51 y.o. male.  HPI Patient presents for respiratory distress.  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  He had asthma as a child but denies any persistent asthma as an adult.  He is followed by [facility] for his heart failure.  His heart failure doctor is [name].  He was previously on Lasix, 80 mg and then 40 mg daily.  He was recently started on Entresto and after this his Lasix dose was decreased to 40 mg twice daily.  He is on multiple blood pressure medications as well.  He has been taking these as prescribed.  Yesterday, he had an episode of shortness of breath that resolved on its own.  This morning he woke up at around 10:00 AM feeling severely short of breath.  He did not take any of his morning medications today.  EMS was called.  EMS reports that on scene, he was in respiratory distress with SPO2 of 70% on room air.  Patient does have oxygen at home to be used as needed.  He states that he typically does not need it throughout the day.  He does sleep with a CPAP for his OSA.  Patient was placed on CPAP by EMS with remarkable improvement in his symptoms.  EMS noted biapical wheezing and so he did receive DuoNebs and Solu-Medrol prior to arrival.  His initial blood pressure on scene was 210 SBP.  He received 2 SL NTG's prior to arrival.  Patient denies any chest pain throughout these episodes.    Home Medications Prior to Admission medications   Medication Sig Start Date End Date Taking? Authorizing Provider  aspirin 81 MG tablet Take 1 tablet (81 mg total) by mouth daily. 04/21/13   Barton Dubois, MD  carvedilol (COREG) 12.5 MG  tablet Take 1 tablet (12.5 mg total) by mouth 2 (two) times daily with a meal. 02/09/21   Bhagat, [place], PA  doxazosin (CARDURA) 1 MG tablet TAKE 1 TABLET BY MOUTH AT BEDTIME 05/30/21   Bensimhon, Shaune Pascal, MD  empagliflozin (JARDIANCE) 10 MG TABS tablet Take 1 tablet (10 mg total) by mouth daily before breakfast. 05/21/21   Rafael Bihari, FNP  ENTRESTO 97-103 MG Take 1 tablet by mouth twice daily 05/30/21   Bensimhon, Shaune Pascal, MD  erythromycin ophthalmic ointment Place 1 application into the right eye at bedtime. 05/01/21   [provider]  furosemide (LASIX) 40 MG tablet Take 1 tablet (40 mg total) by mouth 2 (two) times daily. 05/21/21 08/19/21  Rafael Bihari, FNP  hydrALAZINE (APRESOLINE) 100 MG tablet TAKE 1 TABLET BY MOUTH THREE TIMES DAILY Patient taking differently: Take 100 mg by mouth 3 (three) times daily. 12/02/20   Bensimhon, Shaune Pascal, MD  isosorbide mononitrate (IMDUR) 30 MG 24 hr tablet Take 1 tablet (30 mg total) by mouth daily. 02/09/21   Bhagat, Crista Luria, PA  naproxen sodium (ALEVE) 220 MG tablet Take 440 mg by mouth daily as needed (pain).    [provider]  nitroGLYCERIN (NITROSTAT) 0.4 MG SL tablet Place 1 tablet (0.4 mg total) under the tongue every 5 (five) minutes x 3 doses as needed for chest pain. Patient  not taking: Reported on 03/08/2021 02/09/21   Leanor Kail, PA  Olopatadine HCl (PATADAY OP) Place 1 drop into both eyes in the morning and at bedtime.    [provider]  OXYGEN Inhale 2 L into the lungs as needed (shortness of breath).    [provider]  potassium chloride SA (KLOR-CON) 20 MEQ tablet Take 1 tablet (20 mEq total) by mouth daily. 06/17/20   Bensimhon, Shaune Pascal, MD      Allergies    Fish allergy, Other, Peanuts [peanut oil], Shellfish allergy, Latex, and Pork-derived products    Review of Systems   Review of Systems  Respiratory:  Positive for chest tightness and shortness of breath.   All other  systems reviewed and are negative.  Physical Exam Updated Vital Signs BP (!) 171/109 (BP Location: Right Arm)    Pulse 77    Temp (!) 97.5 F (36.4 C) (Temporal)    Resp (!) 22    SpO2 96%  Physical Exam Vitals and nursing note reviewed.  Constitutional:      General: He is not in acute distress.    Appearance: Normal appearance. He is well-developed. He is not ill-appearing, toxic-appearing or diaphoretic.  HENT:     Head: Normocephalic and atraumatic.     Right Ear: External ear normal.     Left Ear: External ear normal.     Nose: Nose normal.  Eyes:     General: No scleral icterus.    Extraocular Movements: Extraocular movements intact.     Conjunctiva/sclera: Conjunctivae normal.     Comments: Chronic swelling of right eyelid  Cardiovascular:     Rate and Rhythm: Normal rate and regular rhythm.     Heart sounds: No murmur heard. Pulmonary:     Effort: Pulmonary effort is normal. No respiratory distress.     Breath sounds: Examination of the right-lower field reveals decreased breath sounds. Examination of the left-lower field reveals decreased breath sounds. Decreased breath sounds present. No wheezing.  Abdominal:     General: There is distension.     Palpations: Abdomen is soft.     Tenderness: There is no abdominal tenderness.  Musculoskeletal:        General: No swelling. Normal range of motion.     Cervical back: Normal range of motion and neck supple. No rigidity.     Right lower leg: No edema.     Left lower leg: No edema.  Skin:    General: Skin is warm and dry.     Capillary Refill: Capillary refill takes less than 2 seconds.     Coloration: Skin is not jaundiced or pale.  Neurological:     General: No focal deficit present.     Mental Status: He is alert and oriented to person, place, and time.     Cranial Nerves: No cranial nerve deficit.     Sensory: No sensory deficit.     Motor: No weakness.     Coordination: Coordination normal.  Psychiatric:         Mood and Affect: Mood normal.        Behavior: Behavior normal.        Thought Content: Thought content normal.        Judgment: Judgment normal.    ED Results / Procedures / Treatments   Labs (all labs ordered are listed, but only abnormal results are displayed) Labs Reviewed - No data to display  EKG None  Radiology No results found.  Procedures Procedures  {Document cardiac monitor, telemetry assessment procedure when appropriate:1}  Medications Ordered in ED Medications - No data to display  ED Course/ Medical Decision Making/ A&P                           Medical Decision Making  This patient presents to the ED for concern of respiratory distress, this involves an extensive number of treatment options, and is a complaint that carries with it a high risk of complications and morbidity.  The differential diagnosis includes CHF exacerbation, reactive airway disease, pneumonia, PE, ACS   Co morbidities that complicate the patient evaluation  osteogenesis imperfecta, CHF, OSA, obesity, HTN   Additional history obtained:  Additional history obtained from EMS External records from outside source obtained and reviewed including EMR   Lab Tests:  I Ordered, and personally interpreted labs.  The pertinent results include:  ***   Imaging Studies ordered:  I ordered imaging studies including ***  I independently visualized and interpreted imaging which showed *** I agree with the radiologist interpretation   Cardiac Monitoring:  The patient was maintained on a cardiac monitor.  I personally viewed and interpreted the cardiac monitored which showed an underlying rhythm of: ***   Medicines ordered and prescription drug management:  I ordered medication including ***  for ***  Reevaluation of the patient after these medicines showed that the patient {resolved/improved/worsened:23923::"improved"} I have reviewed the patients home medicines and have made  adjustments as needed   Test Considered:  ***   Critical Interventions:  ***   Consultations Obtained:  I requested consultation with the ***,  and discussed lab and imaging findings as well as pertinent plan - they recommend: ***   Problem List / ED Course:  ***    Social Determinants of Health:  ***     {Document critical care time when appropriate:1} {Document review of labs and clinical decision tools ie heart score, Chads2Vasc2 etc:1}  {Document your independent review of radiology images, and any outside records:1} {Document your discussion with family members, caretakers, and with consultants:1} {Document social determinants of health affecting pt's care:1} {Document your decision making why or why not admission, treatments were needed:1} Final Clinical Impression(s) / ED Diagnoses Final diagnoses:  None    Rx / DC Orders ED Discharge Orders     None

## 2021-06-03 ENCOUNTER — Observation Stay (HOSPITAL_COMMUNITY): Payer: Medicare Other

## 2021-06-03 DIAGNOSIS — N179 Acute kidney failure, unspecified: Secondary | ICD-10-CM | POA: Diagnosis not present

## 2021-06-03 DIAGNOSIS — E1122 Type 2 diabetes mellitus with diabetic chronic kidney disease: Secondary | ICD-10-CM | POA: Diagnosis not present

## 2021-06-03 DIAGNOSIS — G4733 Obstructive sleep apnea (adult) (pediatric): Secondary | ICD-10-CM | POA: Diagnosis present

## 2021-06-03 DIAGNOSIS — Z91040 Latex allergy status: Secondary | ICD-10-CM | POA: Diagnosis not present

## 2021-06-03 DIAGNOSIS — I5043 Acute on chronic combined systolic (congestive) and diastolic (congestive) heart failure: Secondary | ICD-10-CM | POA: Diagnosis not present

## 2021-06-03 DIAGNOSIS — Z91010 Allergy to peanuts: Secondary | ICD-10-CM | POA: Diagnosis not present

## 2021-06-03 DIAGNOSIS — I340 Nonrheumatic mitral (valve) insufficiency: Secondary | ICD-10-CM | POA: Diagnosis not present

## 2021-06-03 DIAGNOSIS — Q780 Osteogenesis imperfecta: Secondary | ICD-10-CM | POA: Diagnosis not present

## 2021-06-03 DIAGNOSIS — I272 Pulmonary hypertension, unspecified: Secondary | ICD-10-CM | POA: Diagnosis present

## 2021-06-03 DIAGNOSIS — N189 Chronic kidney disease, unspecified: Secondary | ICD-10-CM

## 2021-06-03 DIAGNOSIS — I2510 Atherosclerotic heart disease of native coronary artery without angina pectoris: Secondary | ICD-10-CM | POA: Diagnosis present

## 2021-06-03 DIAGNOSIS — I083 Combined rheumatic disorders of mitral, aortic and tricuspid valves: Secondary | ICD-10-CM | POA: Diagnosis not present

## 2021-06-03 DIAGNOSIS — I452 Bifascicular block: Secondary | ICD-10-CM | POA: Diagnosis present

## 2021-06-03 DIAGNOSIS — I160 Hypertensive urgency: Secondary | ICD-10-CM | POA: Diagnosis not present

## 2021-06-03 DIAGNOSIS — D631 Anemia in chronic kidney disease: Secondary | ICD-10-CM | POA: Diagnosis not present

## 2021-06-03 DIAGNOSIS — Z91013 Allergy to seafood: Secondary | ICD-10-CM | POA: Diagnosis not present

## 2021-06-03 DIAGNOSIS — Z20822 Contact with and (suspected) exposure to covid-19: Secondary | ICD-10-CM | POA: Diagnosis present

## 2021-06-03 DIAGNOSIS — E119 Type 2 diabetes mellitus without complications: Secondary | ICD-10-CM

## 2021-06-03 DIAGNOSIS — H02103 Unspecified ectropion of right eye, unspecified eyelid: Secondary | ICD-10-CM | POA: Diagnosis present

## 2021-06-03 DIAGNOSIS — I5023 Acute on chronic systolic (congestive) heart failure: Secondary | ICD-10-CM

## 2021-06-03 DIAGNOSIS — Z87891 Personal history of nicotine dependence: Secondary | ICD-10-CM | POA: Diagnosis not present

## 2021-06-03 DIAGNOSIS — N289 Disorder of kidney and ureter, unspecified: Secondary | ICD-10-CM | POA: Diagnosis not present

## 2021-06-03 DIAGNOSIS — N183 Chronic kidney disease, stage 3 unspecified: Secondary | ICD-10-CM | POA: Diagnosis not present

## 2021-06-03 DIAGNOSIS — Z993 Dependence on wheelchair: Secondary | ICD-10-CM | POA: Diagnosis not present

## 2021-06-03 DIAGNOSIS — I509 Heart failure, unspecified: Secondary | ICD-10-CM

## 2021-06-03 DIAGNOSIS — J9621 Acute and chronic respiratory failure with hypoxia: Secondary | ICD-10-CM | POA: Diagnosis present

## 2021-06-03 DIAGNOSIS — I161 Hypertensive emergency: Secondary | ICD-10-CM | POA: Diagnosis present

## 2021-06-03 DIAGNOSIS — I10 Essential (primary) hypertension: Secondary | ICD-10-CM

## 2021-06-03 DIAGNOSIS — I428 Other cardiomyopathies: Secondary | ICD-10-CM | POA: Diagnosis present

## 2021-06-03 DIAGNOSIS — Z6841 Body Mass Index (BMI) 40.0 and over, adult: Secondary | ICD-10-CM | POA: Diagnosis not present

## 2021-06-03 DIAGNOSIS — N1832 Chronic kidney disease, stage 3b: Secondary | ICD-10-CM | POA: Diagnosis present

## 2021-06-03 DIAGNOSIS — I130 Hypertensive heart and chronic kidney disease with heart failure and stage 1 through stage 4 chronic kidney disease, or unspecified chronic kidney disease: Secondary | ICD-10-CM | POA: Diagnosis not present

## 2021-06-03 DIAGNOSIS — Z91014 Allergy to mammalian meats: Secondary | ICD-10-CM | POA: Diagnosis not present

## 2021-06-03 DIAGNOSIS — J9620 Acute and chronic respiratory failure, unspecified whether with hypoxia or hypercapnia: Secondary | ICD-10-CM | POA: Diagnosis not present

## 2021-06-03 LAB — ECHOCARDIOGRAM COMPLETE
AR max vel: 3.61 cm2
AV Peak grad: 10.1 mmHg
Ao pk vel: 1.59 m/s
Area-P 1/2: 4.15 cm2
Calc EF: 37.3 %
Height: 59 in
MV M vel: 5.48 m/s
MV Peak grad: 120.1 mmHg
S' Lateral: 4.5 cm
Single Plane A2C EF: 37 %
Single Plane A4C EF: 38.5 %
Weight: 3343.94 oz

## 2021-06-03 LAB — BASIC METABOLIC PANEL
Anion gap: 9 (ref 5–15)
BUN: 40 mg/dL — ABNORMAL HIGH (ref 6–20)
CO2: 26 mmol/L (ref 22–32)
Calcium: 8.7 mg/dL — ABNORMAL LOW (ref 8.9–10.3)
Chloride: 109 mmol/L (ref 98–111)
Creatinine, Ser: 3.33 mg/dL — ABNORMAL HIGH (ref 0.61–1.24)
GFR, Estimated: 22 mL/min — ABNORMAL LOW (ref >60)
Glucose, Bld: 107 mg/dL — ABNORMAL HIGH (ref 70–99)
Potassium: 3.6 mmol/L (ref 3.5–5.1)
Sodium: 144 mmol/L (ref 135–145)

## 2021-06-03 LAB — GLUCOSE, CAPILLARY
Glucose-Capillary: 97 mg/dL (ref 70–99)
Glucose-Capillary: 117 mg/dL — ABNORMAL HIGH (ref 70–99)
Glucose-Capillary: 100 mg/dL — ABNORMAL HIGH (ref 70–99)
Glucose-Capillary: 142 mg/dL — ABNORMAL HIGH (ref 70–99)

## 2021-06-03 LAB — HEMOGLOBIN A1C
Hgb A1c MFr Bld: 4.8 % (ref 4.8–5.6)
Mean Plasma Glucose: 91 mg/dL

## 2021-06-03 LAB — MAGNESIUM: Magnesium: 2 mg/dL (ref 1.7–2.4)

## 2021-06-03 MED ORDER — POTASSIUM CHLORIDE CRYS ER 20 MEQ PO TBCR
40.0000 meq | EXTENDED_RELEASE_TABLET | Freq: Once | ORAL | Status: AC
  Administered 2021-06-03: 40 meq via ORAL
  Filled 2021-06-03: qty 2

## 2021-06-03 MED ORDER — SODIUM CHLORIDE 0.9% FLUSH
3.0000 mL | Freq: Two times a day (BID) | INTRAVENOUS | Status: DC
  Administered 2021-06-03 – 2021-06-05 (×4): 3 mL via INTRAVENOUS

## 2021-06-03 MED ORDER — SODIUM CHLORIDE 0.9 % IV SOLN: INTRAVENOUS | Status: DC

## 2021-06-03 MED ORDER — SODIUM CHLORIDE 0.9 % IV SOLN
250.0000 mL | INTRAVENOUS | Status: DC | PRN
Start: 2021-06-03 — End: 2021-06-04

## 2021-06-03 MED ORDER — PERFLUTREN LIPID MICROSPHERE
1.0000 mL | INTRAVENOUS | Status: AC | PRN
  Administered 2021-06-03: 2 mL via INTRAVENOUS
  Filled 2021-06-03: qty 10

## 2021-06-03 MED ORDER — ISOSORBIDE MONONITRATE ER 60 MG PO TB24
60.0000 mg | ORAL_TABLET | Freq: Every day | ORAL | Status: DC
  Administered 2021-06-03 – 2021-06-04 (×2): 60 mg via ORAL
  Filled 2021-06-03 (×2): qty 1

## 2021-06-03 MED ORDER — SODIUM CHLORIDE 0.9% FLUSH: 3.0000 mL | INTRAVENOUS | Status: DC | PRN

## 2021-06-03 MED ORDER — ASPIRIN EC 81 MG PO TBEC
81.0000 mg | DELAYED_RELEASE_TABLET | Freq: Every day | ORAL | Status: DC
  Administered 2021-06-05 – 2021-06-06 (×2): 81 mg via ORAL
  Filled 2021-06-03 (×2): qty 1

## 2021-06-03 MED ORDER — ASPIRIN 81 MG PO CHEW
81.0000 mg | CHEWABLE_TABLET | ORAL | Status: AC
  Administered 2021-06-04: 81 mg via ORAL
  Filled 2021-06-03: qty 1

## 2021-06-03 NOTE — Progress Notes (Signed)
?  Transition of Care (TOC) Screening Note ? ? ?Patient Details  ?Name: Jerry Edwards ?Date of Birth: September 04, 1970 ? ? ?Transition of Care (TOC) CM/SW Contact:    ?Gershom Brobeck, LCSW ?Phone Number: ?06/03/2021, 1:58 PM ? ? ? ?Transition of Care Department [facility]) has reviewed patient and no TOC needs have been identified at this time. We will continue to monitor patient advancement through interdisciplinary progression rounds. Patient will benefit from PT/OT consult for disposition recommendations. If new patient transition needs arise, please place a TOC consult. ?  ?

## 2021-06-03 NOTE — Assessment & Plan Note (Signed)
Echocardiogram with LV EF 30 to 35% with moderate cavity dilatation, preserved RV systolic function, mild elevated pulmonary systolic pressure, moderate to severe mitral valve regurgitation.  ? ?Patient continue volume overloaded.  ? ?His urine output is documented at 2,300  ?Systolic blood pressure is 120 to 140 mmHg ? ?Plan to continue aggressive diuresis with furosemide.  ?Close monitoring of blood pressure. ?Continue with carvedilol, and after load reduction with hydralazine and isosorbide.  ? ?Plan for right cardiac catheterization to evaluate mitral valve.  ?Possible TEE  ? ?  ?

## 2021-06-03 NOTE — Assessment & Plan Note (Addendum)
CKD stage 3b  ? ?Renal function with serum cr at 3,3 with K at 3,6 and serum bicarbonate at 26 ? ?Plan to continue diuresis with furosemide for volume overload.  ?

## 2021-06-03 NOTE — Assessment & Plan Note (Signed)
Patient with high risk for inpatient complications ?Continue to encourage mobility and out of bed to chair tid with meals.  ?

## 2021-06-03 NOTE — Progress Notes (Signed)
Heart Failure Navigator Progress Note ? ?Assessed for Heart & Vascular TOC clinic readiness.  ?Patient does not meet criteria due to AHF rounding team consult.   ? ? ? ?Earnestine Leys, BSN, RN ?Heart Failure Nurse Navigator ?[phone]  ? ?

## 2021-06-03 NOTE — Assessment & Plan Note (Signed)
Continue glucose cover and monitoring with insulin sliding scale.  Patient is tolerating po well.  

## 2021-06-03 NOTE — Assessment & Plan Note (Signed)
Calculated BMI is 42,2 ?

## 2021-06-03 NOTE — Plan of Care (Signed)

## 2021-06-03 NOTE — Progress Notes (Signed)
?Progress Note ? ? ?Patient: Jerry Edwards TIR:[id number] DOB: [birth year] DOA: 06/02/2021     0 ?DOS: the patient was seen and examined on 06/03/2021 ?  ?Brief hospital course: ?Mr. Neilan was admitted to the hospital with the working diagnosis of decompensated heart failure.  ? ?51 yo male with the past medical history of heart failure, with non ischemic cardiomyopathy, hypertension, pulmonary hypertension, type 2 diabetes mellitus, obesity class 3, chronic kidney disease stage 3b and osteogenesis imperfecta who presented with dyspnea. Patient reported 24 hrs of worsening dyspnea, associated with PND and orthopnea. EMS was called and he was found with a blood pressure systolic greater than 676 and pulmonary wheezing, then he was transported to the hospital.  ?2 weeks prior his diuretic therapy was reduced.  ?On his initial physical examination his blood pressure was 163/107, HR 86, RR 23 and 02 saturation 94% on supplemental 02, his lungs had bilateral rales ans increased work of breathing, heart with S1 and S2 present and rhythmic, no gallops, abdomen soft and no lower extremity edema.  ? ?Na 146, K 3,7 Cl 111, bicarbonate 25, glucose 118 bun 32 cr 3,15 ?BNP > 4,500 ?High sensitive troponin 40 and 61  ?Wbc 4,7 hgb 11,7 hct 39,9  plt 177 ?Sars covid 19 negative  ? ?Chest radiograph with cardiomegaly with bilateral interstitial infiltrates bilaterally.  ? ?EKG 77 bpm, left axis deviation, left bundle branch block, normal qtc, sinus rhythm with poor R wave progression, with no significant  ?ST segment or T wave changes.  ? ? ?Assessment and Plan: ?* Acute on chronic systolic heart failure (HCC) ?Echocardiogram with LV EF 30 to 35% with moderate cavity dilatation, preserved RV systolic function, mild elevated pulmonary systolic pressure, moderate to severe mitral valve regurgitation.  ? ?Patient continue volume overloaded.  ? ?His urine output is documented at 2,300  ?Systolic blood pressure is 120 to 140 mmHg ? ?Plan to  continue aggressive diuresis with furosemide.  ?Close monitoring of blood pressure. ?Continue with carvedilol, and after load reduction with hydralazine and isosorbide.  ? ?Plan for right cardiac catheterization to evaluate mitral valve.  ?Possible TEE  ? ?  ? ?HTN (hypertension) ?Continue blood pressure control with carvedilol, hydralazine and isosorbide ?Continue diuresis with furosemide  ? ?Acute kidney injury superimposed on chronic kidney disease ([place]) ?CKD stage 3b  ? ?Renal function with serum cr at 3,3 with K at 3,6 and serum bicarbonate at 26 ? ?Plan to continue diuresis with furosemide for volume overload.  ? ?Type 2 diabetes mellitus ([place]) ?Continue glucose cover and monitoring with insulin sliding scale. ?Patient is tolerating po well.  ? ?Osteogenesis imperfecta ?Patient with high risk for inpatient complications ?Continue to encourage mobility and out of bed to chair tid with meals.  ? ?Class 3 obesity ([place]) ?Calculated BMI is 42,2 ? ? ? ? ?  ? ?Subjective: patient is feeling better but not yet back to baseline, no chest pain  ? ?Physical Exam: ?Vitals:  ? 06/03/21 0000 06/03/21 0323 06/03/21 0757 06/03/21 1142  ?BP: (!) 143/83 (!) 153/95 (!) 143/89 126/82  ?Pulse: 99 71 (!) 58 68  ?Resp: (!) 22 (!) '21 18 16  '$ ?Temp: 98.1 ?F (36.7 ?C) (!) 97.4 ?F (36.3 ?C)    ?TempSrc: Oral Axillary  Oral  ?SpO2: 95% 90% 93% 94%  ?Weight:  94.8 kg    ?Height:      ? ?Neurology awake and alert ?ENT with no pallor ?Cardiovascular with S1 and S2 present and rhythmic with no  gallops or rubs, positive systolic murmur a the apex ?No JVD ?Positive peripheral lower extremity edema ?Respiratory with scattered rales at bases, no wheezing ?Abdomen not distended ?Musculoskeletal with multiple spine and lower extremities deformities.  ?Data Reviewed: ? ? ? ?Family Communication: no family at the bedside  ? ?Disposition: ?Status is: Inpatient ?Remains inpatient appropriate because: heart failure  ? Planned Discharge Destination:  Home ? ?Author: ?Tawni Millers, MD ?06/03/2021 3:04 PM ? ?For on call review [email or web address].  ?

## 2021-06-03 NOTE — Assessment & Plan Note (Signed)
Continue blood pressure control with carvedilol, hydralazine and isosorbide ?Continue diuresis with furosemide  ?

## 2021-06-03 NOTE — Progress Notes (Addendum)
? ? Advanced Heart Failure Rounding Note ? ?PCP-Cardiologist: None  ? ?Subjective:   ?Yesterday diuresed with IV lasix. Negative >1.8 liters.  ? ?Creatinine 2.9>3.15>3.3 ? ?Remains on NTG drip 35 mcg. ? ?Denies chest pain. Denies SOB ? ?Objective:   ?Weight Range: ?94.8 kg ?Body mass index is 42.21 kg/m?.  ? ?Vital Signs:   ?Temp:  [97.4 ?F (36.3 ?C)-98.4 ?F (36.9 ?C)] 97.4 ?F (36.3 ?C) (03/14 0323) ?Pulse Rate:  [58-103] 58 (03/14 0757) ?Resp:  [16-28] 18 (03/14 0757) ?BP: (139-171)/(83-128) 143/89 (03/14 0757) ?SpO2:  [90 %-98 %] 93 % (03/14 0757) ?FiO2 (%):  [40 %] 40 % (03/13 1207) ?Weight:  [94.8 kg-95.2 kg] 94.8 kg (03/14 0323) ?  ? ?Weight change: ?Filed Weights  ? 06/02/21 1739 06/03/21 0323  ?Weight: 95.2 kg 94.8 kg  ? ? ?Intake/Output:  ? ?Intake/Output Summary (Last 24 hours) at 06/03/2021 1050 ?Last data filed at 06/03/2021 0900 ?Gross per 24 hour  ?Intake 489.84 ml  ?Output 2500 ml  ?Net -2010.16 ml  ?  ? ? ?Physical Exam  ?  ?General:  No resp difficulty. In bed.  ?HEENT: Normal ?Neck: Supple. JVP  difficult to assess. Carotids 2+ bilat; no bruits. No lymphadenopathy or thyromegaly appreciated. ?Cor: PMI nondisplaced. Regular rate & rhythm. No rubs, gallops or murmurs. ?Lungs: Clear ?Abdomen: Soft, nontender, distended. No hepatosplenomegaly. No bruits or masses. Good bowel sounds. ?Extremities: No cyanosis, clubbing, rash, edema ?Neuro: Alert & orientedx3, cranial nerves grossly intact. moves all 4 extremities w/o difficulty. Affect pleasant ? ? ?Telemetry  ?SR 70-90s with rare PVCs < 5 per hour ? ?EKG  ?  ?N/A  ? ?Labs  ?  ?CBC ?Recent Labs  ?  06/02/21 ?1223  ?WBC 4.7  ?NEUTROABS 3.5  ?HGB 11.7*  ?HCT 39.9  ?MCV 98.3  ?PLT 177  ? ?Basic Metabolic Panel ?Recent Labs  ?  06/02/21 ?1223 06/03/21 ?0716  ?NA 146* 144  ?K 3.7 3.6  ?CL 111 109  ?CO2 25 26  ?GLUCOSE 118* 107*  ?BUN 32* 40*  ?CREATININE 3.15* 3.33*  ?CALCIUM 8.6* 8.7*  ?MG 2.0  --   ? ?Liver Function Tests ?Recent Labs  ?  06/02/21 ?1223   ?AST 30  ?ALT 23  ?ALKPHOS 54  ?BILITOT 0.7  ?PROT 6.7  ?ALBUMIN 2.8*  ? ?No results for input(s): LIPASE, AMYLASE in the last 72 hours. ?Cardiac Enzymes ?No results for input(s): CKTOTAL, CKMB, CKMBINDEX, TROPONINI in the last 72 hours. ? ?BNP: ?BNP (last 3 results) ?Recent Labs  ?  02/08/21 ?2110 05/21/21 ?1153 06/02/21 ?1223  ?BNP 585.1* 1,681.3* >4,500.0*  ? ? ?ProBNP (last 3 results) ?No results for input(s): PROBNP in the last 8760 hours. ? ? ?D-Dimer ?No results for input(s): DDIMER in the last 72 hours. ?Hemoglobin A1C ?Recent Labs  ?  06/02/21 ?1527  ?HGBA1C 4.8  ? ?Fasting Lipid Panel ?No results for input(s): CHOL, HDL, LDLCALC, TRIG, CHOLHDL, LDLDIRECT in the last 72 hours. ?Thyroid Function Tests ?No results for input(s): TSH, T4TOTAL, T3FREE, THYROIDAB in the last 72 hours. ? ?Invalid input(s): FREET3 ? ?Other results: ? ? ?Imaging  ? ? ?DG Chest Port 1 View ? ?Result Date: 06/02/2021 ?CLINICAL DATA:  Dyspnea EXAM: PORTABLE CHEST 1 VIEW COMPARISON:  Radiograph 05/10/2021 FINDINGS: Unchanged enlarged cardiomediastinal silhouette. There are diffuse interstitial airspace opacities. No visible large effusion. No visible pneumothorax. No acute osseous abnormality. IMPRESSION: Moderate pulmonary edema. Electronically Signed   By: Maurine Simmering M.D.   On: 06/02/2021 12:49  ? ?  ECHOCARDIOGRAM COMPLETE ? ?Result Date: 06/03/2021 ?   ECHOCARDIOGRAM REPORT   Patient Name:   Jerry Edwards Date of Exam: 06/03/2021 Medical Rec #:  [id number]     Height:       59.0 in Accession #:    [id number]    Weight:       209.0 lb Date of Birth:  [birth year]     BSA:          1.879 m? Patient Age:    3 years      BP:           153/95 mmHg Patient Gender: M             HR:           67 bpm. Exam Location:  Inpatient Procedure: 2D Echo, Cardiac Doppler, Color Doppler and Intracardiac            Opacification Agent Indications:    CHF  History:        Patient has prior history of Echocardiogram examinations. CHF;                 Risk  Factors:Hypertension and Sleep Apnea.  Sonographer:    Jyl Heinz Referring Phys: [phone] LINDSAY NICOLE [place]  1. No left ventricular thrombus is seen (Definity was not administered). Left ventricular ejection fraction, by estimation, is 30 to 35%. The left ventricle has moderately decreased function. The left ventricle has no regional wall motion abnormalities.  The left ventricular internal cavity size was moderately dilated. Left ventricular diastolic parameters are consistent with Grade II diastolic dysfunction (pseudonormalization).  2. Right ventricular systolic function is normal. The right ventricular size is normal. There is mildly elevated pulmonary artery systolic pressure.  3. The mitral valve is normal in structure. Moderate to severe mitral valve regurgitation. No evidence of mitral stenosis.  4. The aortic valve is normal in structure. Aortic valve regurgitation is not visualized. No aortic stenosis is present.  5. There is midsystolic notching of the RVOT waveform, generally considered specific for severely elevated pulmonary vascular resistance.  6. The inferior vena cava is dilated in size with <50% respiratory variability, suggesting right atrial pressure of 15 mmHg. Comparison(s): The left ventricular function is significantly worse. The left ventricular diastolic function is significantly worse. The left ventricular wall motion abnormalities are significantly worse. 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 There is new inferior wall hypokinesis. Mitral insufficiency is new and may be severe. There is evidence of elevation in left and right atrial pressures, new from the previous study. Although PA pressure is probably underestimated due to poor TR jet density, there is notching of the RVOT systolic VTI, consistent with markedly elevated PA pressure. FINDINGS  Left Ventricle: No left ventricular thrombus is seen (Definity was not  administered). Left ventricular ejection fraction, by estimation, is 30 to 35%. The left ventricle has moderately decreased function. The left ventricle has no regional wall motion abnormalities. The left ventricular internal cavity size was moderately dilated. There is no left ventricular hypertrophy. Left ventricular diastolic parameters are consistent with Grade II diastolic dysfunction (pseudonormalization).  LV Wall Scoring: The apical septal segment, apical inferior segment, and apex are dyskinetic. The mid and distal anterior wall, mid anteroseptal segment, and apical lateral segment are akinetic. The inferior wall is hypokinetic. The antero-lateral wall, posterior wall, basal anteroseptal segment, mid inferoseptal segment, basal anterior segment, and basal inferoseptal segment are normal. Right Ventricle:  The right ventricular size is normal. No increase in right ventricular wall thickness. Right ventricular systolic function is normal. There is mildly elevated pulmonary artery systolic pressure. The tricuspid regurgitant velocity is 2.61  m/s, and with an assumed right atrial pressure of 15 mmHg, the estimated right ventricular systolic pressure is 96.7 mmHg. Left Atrium: Left atrial size was normal in size. Right Atrium: Right atrial size was normal in size. Pericardium: There is no evidence of pericardial effusion. Mitral Valve: The mitral valve is normal in structure. Mild mitral annular calcification. Moderate to severe mitral valve regurgitation. No evidence of mitral valve stenosis. Tricuspid Valve: The tricuspid valve is normal in structure. Tricuspid valve regurgitation is mild . No evidence of tricuspid stenosis. Aortic Valve: The aortic valve is normal in structure. Aortic valve regurgitation is not visualized. No aortic stenosis is present. Aortic valve peak gradient measures 10.1 mmHg. Pulmonic Valve: There is midsystolic notching of the RVOT waveform, generally considered specific for severely  elevated pulmonary vascular resistance. The pulmonic valve was normal in structure. Pulmonic valve regurgitation is not visualized. No evidence  of pulmonic stenosis. Aorta: The aortic root is normal in size

## 2021-06-03 NOTE — Hospital Course (Signed)
Mr. Titsworth was admitted to the hospital with the working diagnosis of decompensated heart failure.  ? ?51 yo male with the past medical history of heart failure, with non ischemic cardiomyopathy, hypertension, pulmonary hypertension, type 2 diabetes mellitus, obesity class 3, chronic kidney disease stage 3b and osteogenesis imperfecta who presented with dyspnea. Patient reported 24 hrs of worsening dyspnea, associated with PND and orthopnea. EMS was called and he was found with a blood pressure systolic greater than 500 and pulmonary wheezing, then he was transported to the hospital.  ?2 weeks prior his diuretic therapy was reduced.  ?On his initial physical examination his blood pressure was 163/107, HR 86, RR 23 and 02 saturation 94% on supplemental 02, his lungs had bilateral rales ans increased work of breathing, heart with S1 and S2 present and rhythmic, no gallops, abdomen soft and no lower extremity edema.  ? ?Na 146, K 3,7 Cl 111, bicarbonate 25, glucose 118 bun 32 cr 3,15 ?BNP > 4,500 ?High sensitive troponin 40 and 61  ?Wbc 4,7 hgb 11,7 hct 39,9  plt 177 ?Sars covid 19 negative  ? ?Chest radiograph with cardiomegaly with bilateral interstitial infiltrates bilaterally.  ? ?EKG 77 bpm, left axis deviation, left bundle branch block, normal qtc, sinus rhythm with poor R wave progression, with no significant  ?ST segment or T wave changes.  ? ?

## 2021-06-03 NOTE — Progress Notes (Signed)
Patient on BiPAP(dreamstation) at this time with 4 Lpm O2 bled in. Patient resting comfortably.  ?

## 2021-06-04 ENCOUNTER — Encounter (HOSPITAL_COMMUNITY): Payer: Self-pay | Admitting: Internal Medicine

## 2021-06-04 ENCOUNTER — Encounter (HOSPITAL_COMMUNITY)
Admission: EM | Disposition: A | Discharge status: Home / Self Care | Payer: Self-pay | Source: Home / Self Care | Attending: Internal Medicine

## 2021-06-04 DIAGNOSIS — I160 Hypertensive urgency: Secondary | ICD-10-CM

## 2021-06-04 DIAGNOSIS — J9620 Acute and chronic respiratory failure, unspecified whether with hypoxia or hypercapnia: Secondary | ICD-10-CM

## 2021-06-04 HISTORY — PX: RIGHT HEART CATH: CATH118263

## 2021-06-04 LAB — BASIC METABOLIC PANEL
Anion gap: 14 (ref 5–15)
BUN: 46 mg/dL — ABNORMAL HIGH (ref 6–20)
CO2: 23 mmol/L (ref 22–32)
Calcium: 8.3 mg/dL — ABNORMAL LOW (ref 8.9–10.3)
Chloride: 106 mmol/L (ref 98–111)
Creatinine, Ser: 3.16 mg/dL — ABNORMAL HIGH (ref 0.61–1.24)
GFR, Estimated: 23 mL/min — ABNORMAL LOW (ref >60)
Glucose, Bld: 84 mg/dL (ref 70–99)
Potassium: 3.6 mmol/L (ref 3.5–5.1)
Sodium: 143 mmol/L (ref 135–145)

## 2021-06-04 LAB — POCT I-STAT EG7
Acid-Base Excess: 2 mmol/L (ref 0.0–2.0)
Acid-Base Excess: 2 mmol/L (ref 0.0–2.0)
Acid-Base Excess: 3 mmol/L — ABNORMAL HIGH (ref 0.0–2.0)
Bicarbonate: 28.3 mmol/L — ABNORMAL HIGH (ref 20.0–28.0)
Bicarbonate: 28.6 mmol/L — ABNORMAL HIGH (ref 20.0–28.0)
Bicarbonate: 28.8 mmol/L — ABNORMAL HIGH (ref 20.0–28.0)
Calcium, Ion: 1.19 mmol/L (ref 1.15–1.40)
Calcium, Ion: 1.22 mmol/L (ref 1.15–1.40)
Calcium, Ion: 1.23 mmol/L (ref 1.15–1.40)
HCT: 38 % — ABNORMAL LOW (ref 39.0–52.0)
HCT: 39 % (ref 39.0–52.0)
HCT: 39 % (ref 39.0–52.0)
Hemoglobin: 12.9 g/dL — ABNORMAL LOW (ref 13.0–17.0)
Hemoglobin: 13.3 g/dL (ref 13.0–17.0)
Hemoglobin: 13.3 g/dL (ref 13.0–17.0)
O2 Saturation: 69 %
O2 Saturation: 72 %
O2 Saturation: 75 %
Potassium: 3.6 mmol/L (ref 3.5–5.1)
Potassium: 3.6 mmol/L (ref 3.5–5.1)
Potassium: 3.7 mmol/L (ref 3.5–5.1)
Sodium: 146 mmol/L — ABNORMAL HIGH (ref 135–145)
Sodium: 146 mmol/L — ABNORMAL HIGH (ref 135–145)
Sodium: 146 mmol/L — ABNORMAL HIGH (ref 135–145)
TCO2: 30 mmol/L (ref 22–32)
TCO2: 30 mmol/L (ref 22–32)
TCO2: 30 mmol/L (ref 22–32)
pCO2, Ven: 48.7 mmHg (ref 44–60)
pCO2, Ven: 49.1 mmHg (ref 44–60)
pCO2, Ven: 49.4 mmHg (ref 44–60)
pH, Ven: 7.366 (ref 7.25–7.43)
pH, Ven: 7.373 (ref 7.25–7.43)
pH, Ven: 7.38 (ref 7.25–7.43)
pO2, Ven: 38 mmHg (ref 32–45)
pO2, Ven: 39 mmHg (ref 32–45)
pO2, Ven: 42 mmHg (ref 32–45)

## 2021-06-04 LAB — GLUCOSE, CAPILLARY
Glucose-Capillary: 76 mg/dL (ref 70–99)
Glucose-Capillary: 119 mg/dL — ABNORMAL HIGH (ref 70–99)
Glucose-Capillary: 86 mg/dL (ref 70–99)
Glucose-Capillary: 119 mg/dL — ABNORMAL HIGH (ref 70–99)

## 2021-06-04 LAB — MAGNESIUM: Magnesium: 2 mg/dL (ref 1.7–2.4)

## 2021-06-04 SURGERY — RIGHT HEART CATH
Anesthesia: LOCAL

## 2021-06-04 MED ORDER — ONDANSETRON HCL 4 MG/2ML IJ SOLN: 4.0000 mg | Freq: Four times a day (QID) | INTRAMUSCULAR | Status: DC | PRN

## 2021-06-04 MED ORDER — SODIUM CHLORIDE 0.9% FLUSH
3.0000 mL | Freq: Two times a day (BID) | INTRAVENOUS | Status: DC
  Administered 2021-06-04 – 2021-06-06 (×4): 3 mL via INTRAVENOUS

## 2021-06-04 MED ORDER — SODIUM CHLORIDE 0.9% FLUSH: 3.0000 mL | INTRAVENOUS | Status: DC | PRN

## 2021-06-04 MED ORDER — LABETALOL HCL 5 MG/ML IV SOLN: 10.0000 mg | INTRAVENOUS | Status: AC | PRN

## 2021-06-04 MED ORDER — POTASSIUM CHLORIDE CRYS ER 20 MEQ PO TBCR
40.0000 meq | EXTENDED_RELEASE_TABLET | Freq: Once | ORAL | Status: AC
  Administered 2021-06-04: 40 meq via ORAL

## 2021-06-04 MED ORDER — ACETAMINOPHEN 325 MG PO TABS: 650.0000 mg | ORAL_TABLET | ORAL | Status: DC | PRN

## 2021-06-04 MED ORDER — LIDOCAINE HCL (PF) 1 % IJ SOLN
INTRAMUSCULAR | Status: AC
  Filled 2021-06-04: qty 30

## 2021-06-04 MED ORDER — HEPARIN (PORCINE) IN NACL 1000-0.9 UT/500ML-% IV SOLN
INTRAVENOUS | Status: AC
Start: 2021-06-04 — End: ?
  Filled 2021-06-04: qty 500

## 2021-06-04 MED ORDER — SODIUM CHLORIDE 0.9 % IV SOLN: 250.0000 mL | INTRAVENOUS | Status: DC | PRN

## 2021-06-04 MED ORDER — HYDRALAZINE HCL 20 MG/ML IJ SOLN: 10.0000 mg | INTRAMUSCULAR | Status: AC | PRN

## 2021-06-04 MED ORDER — POTASSIUM CHLORIDE CRYS ER 20 MEQ PO TBCR
20.0000 meq | EXTENDED_RELEASE_TABLET | Freq: Once | ORAL | Status: AC
  Administered 2021-06-04: 20 meq via ORAL
  Filled 2021-06-04: qty 1

## 2021-06-04 MED ORDER — SODIUM CHLORIDE 0.9 % IV SOLN: INTRAVENOUS | Status: DC

## 2021-06-04 MED ORDER — HEPARIN (PORCINE) IN NACL 1000-0.9 UT/500ML-% IV SOLN
INTRAVENOUS | Status: DC | PRN
  Administered 2021-06-04: 500 mL

## 2021-06-04 MED ORDER — LIDOCAINE HCL (PF) 1 % IJ SOLN
INTRAMUSCULAR | Status: DC | PRN
  Administered 2021-06-04: 2 mL

## 2021-06-04 SURGICAL SUPPLY — 11 items
CATH BALLN WEDGE 5F 110CM (CATHETERS) ×1 IMPLANT
GUIDEWIRE .025 260CM (WIRE) ×1 IMPLANT
KIT MICROPUNCTURE NIT STIFF (SHEATH) ×1 IMPLANT
PACK CARDIAC CATHETERIZATION (CUSTOM PROCEDURE TRAY) ×2 IMPLANT
PROTECTION STATION PRESSURIZED (MISCELLANEOUS) ×2
SHEATH GLIDE SLENDER 4/5FR (SHEATH) ×1 IMPLANT
SHEATH PROBE COVER 6X72 (BAG) ×1 IMPLANT
STATION PROTECTION PRESSURIZED (MISCELLANEOUS) IMPLANT
TRANSDUCER W/STOPCOCK (MISCELLANEOUS) ×2 IMPLANT
TUBING ART PRESS 72  MALE/FEM (TUBING) ×1
TUBING ART PRESS 72 MALE/FEM (TUBING) IMPLANT

## 2021-06-04 NOTE — H&P (View-Only) (Signed)
? ? Advanced Heart Failure Rounding Note ? ?PCP-Cardiologist: None  ? ?Subjective:   ? ?-2.4 L in UOP w/ IV Lasix yesterday. Wt down 4 lb.  ? ?SCr 3.15>>3.33>>3.16 ? ?Echo: LVEF 30-35% (stable). Severe MR (new)  ? ?On for RHC today  ? ?No complaints this morning. Sleep ok last night. Sleeping w/ BiPAP. No dyspnea. Denies CP.  ? ?Objective:   ?Weight Range: ?93.3 kg ?Body mass index is 41.54 kg/m?.  ? ?Vital Signs:   ?Temp:  [97.4 ?F (36.3 ?C)-97.9 ?F (36.6 ?C)] 97.4 ?F (36.3 ?C) (03/15 0416) ?Pulse Rate:  [58-84] 62 (03/15 0416) ?Resp:  [14-20] 19 (03/15 0416) ?BP: (126-143)/(82-93) 139/93 (03/15 0416) ?SpO2:  [93 %-99 %] 98 % (03/15 0416) ?Weight:  [93.3 kg] 93.3 kg (03/15 0416) ?Last BM Date : 06/02/21 ? ?Weight change: ?Filed Weights  ? 06/02/21 1739 06/03/21 0323 06/04/21 0416  ?Weight: 95.2 kg 94.8 kg 93.3 kg  ? ? ?Intake/Output:  ? ?Intake/Output Summary (Last 24 hours) at 06/04/2021 0742 ?Last data filed at 06/04/2021 0300 ?Gross per 24 hour  ?Intake 246 ml  ?Output 2362 ml  ?Net -2116 ml  ?  ? ? ?Physical Exam  ?  ?General:  moderately obese. No respiratory difficulty ?HEENT: normal ?Neck: supple. Thick neck, JVD not well visualized. Carotids 2+ bilat; no bruits. No lymphadenopathy or thyromegaly appreciated. ?Cor: PMI nondisplaced. Regular rate & rhythm.3/6 MR murmur  ?Lungs: decreased BS at the bases bilaterally  ?Abdomen: obese, soft, nontender, nondistended. No hepatosplenomegaly. No bruits or masses. Good bowel sounds. ?Extremities: no cyanosis, clubbing, rash, edema, b/l LE deformity (osteogenesis imperfecta)   ?Neuro: alert & oriented x 3, cranial nerves grossly intact. moves all 4 extremities w/o difficulty. Affect pleasant. ? ? ?Telemetry  ? ?NSR/ SBPs upper 50s-60s  personally reviewed  ? ?EKG  ?  ?N/A  ? ?Labs  ?  ?CBC ?Recent Labs  ?  06/02/21 ?1223  ?WBC 4.7  ?NEUTROABS 3.5  ?HGB 11.7*  ?HCT 39.9  ?MCV 98.3  ?PLT 177  ? ?Basic Metabolic Panel ?Recent Labs  ?  06/03/21 ?0716 06/03/21 ?1108  06/04/21 ?0322  ?NA 144  --  143  ?K 3.6  --  3.6  ?CL 109  --  106  ?CO2 26  --  23  ?GLUCOSE 107*  --  84  ?BUN 40*  --  46*  ?CREATININE 3.33*  --  3.16*  ?CALCIUM 8.7*  --  8.3*  ?MG  --  2.0 2.0  ? ?Liver Function Tests ?Recent Labs  ?  06/02/21 ?1223  ?AST 30  ?ALT 23  ?ALKPHOS 54  ?BILITOT 0.7  ?PROT 6.7  ?ALBUMIN 2.8*  ? ?No results for input(s): LIPASE, AMYLASE in the last 72 hours. ?Cardiac Enzymes ?No results for input(s): CKTOTAL, CKMB, CKMBINDEX, TROPONINI in the last 72 hours. ? ?BNP: ?BNP (last 3 results) ?Recent Labs  ?  02/08/21 ?2110 05/21/21 ?1153 06/02/21 ?1223  ?BNP 585.1* 1,681.3* >4,500.0*  ? ? ?ProBNP (last 3 results) ?No results for input(s): PROBNP in the last 8760 hours. ? ? ?D-Dimer ?No results for input(s): DDIMER in the last 72 hours. ?Hemoglobin A1C ?Recent Labs  ?  06/02/21 ?1527  ?HGBA1C 4.8  ? ?Fasting Lipid Panel ?No results for input(s): CHOL, HDL, LDLCALC, TRIG, CHOLHDL, LDLDIRECT in the last 72 hours. ?Thyroid Function Tests ?No results for input(s): TSH, T4TOTAL, T3FREE, THYROIDAB in the last 72 hours. ? ?Invalid input(s): FREET3 ? ?Other results: ? ? ?Imaging  ? ? ?ECHOCARDIOGRAM COMPLETE ? ?  Result Date: 06/03/2021 ?   ECHOCARDIOGRAM REPORT   Patient Name:   Jerry Edwards Date of Exam: 06/03/2021 Medical Rec #:  [id number]     Height:       59.0 in Accession #:    [id number]    Weight:       209.0 lb Date of Birth:  [birth year]     BSA:          1.879 m? Patient Age:    51 years      BP:           153/95 mmHg Patient Gender: M             HR:           67 bpm. Exam Location:  Inpatient Procedure: 2D Echo, Cardiac Doppler, Color Doppler and Intracardiac            Opacification Agent Indications:    CHF  History:        Patient has prior history of Echocardiogram examinations. CHF;                 Risk Factors:Hypertension and Sleep Apnea.  Sonographer:    Jyl Heinz Referring Phys: [phone] LINDSAY NICOLE [place]  1. No left ventricular thrombus is seen (Definity was not  administered). Left ventricular ejection fraction, by estimation, is 30 to 35%. The left ventricle has moderately decreased function. The left ventricle has no regional wall motion abnormalities.  The left ventricular internal cavity size was moderately dilated. Left ventricular diastolic parameters are consistent with Grade II diastolic dysfunction (pseudonormalization).  2. Right ventricular systolic function is normal. The right ventricular size is normal. There is mildly elevated pulmonary artery systolic pressure.  3. The mitral valve is normal in structure. Moderate to severe mitral valve regurgitation. No evidence of mitral stenosis.  4. The aortic valve is normal in structure. Aortic valve regurgitation is not visualized. No aortic stenosis is present.  5. There is midsystolic notching of the RVOT waveform, generally considered specific for severely elevated pulmonary vascular resistance.  6. The inferior vena cava is dilated in size with <50% respiratory variability, suggesting right atrial pressure of 15 mmHg. Comparison(s): The left ventricular function is significantly worse. The left ventricular diastolic function is significantly worse. The left ventricular wall motion abnormalities are significantly worse. 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 There is new inferior wall hypokinesis. Mitral insufficiency is new and may be severe. There is evidence of elevation in left and right atrial pressures, new from the previous study. Although PA pressure is probably underestimated due to poor TR jet density, there is notching of the RVOT systolic VTI, consistent with markedly elevated PA pressure. FINDINGS  Left Ventricle: No left ventricular thrombus is seen (Definity was not administered). Left ventricular ejection fraction, by estimation, is 30 to 35%. The left ventricle has moderately decreased function. The left ventricle has no regional wall motion  abnormalities. The left ventricular internal cavity size was moderately dilated. There is no left ventricular hypertrophy. Left ventricular diastolic parameters are consistent with Grade II diastolic dysfunction (pseudonormalization).  LV Wall Scoring: The apical septal segment, apical inferior segment, and apex are dyskinetic. The mid and distal anterior wall, mid anteroseptal segment, and apical lateral segment are akinetic. The inferior wall is hypokinetic. The antero-lateral wall, posterior wall, basal anteroseptal segment, mid inferoseptal segment, basal anterior segment, and basal inferoseptal segment are normal. Right Ventricle: The right ventricular  size is normal. No increase in right ventricular wall thickness. Right ventricular systolic function is normal. There is mildly elevated pulmonary artery systolic pressure. The tricuspid regurgitant velocity is 2.61  m/s, and with an assumed right atrial pressure of 15 mmHg, the estimated right ventricular systolic pressure is 09.8 mmHg. Left Atrium: Left atrial size was normal in size. Right Atrium: Right atrial size was normal in size. Pericardium: There is no evidence of pericardial effusion. Mitral Valve: The mitral valve is normal in structure. Mild mitral annular calcification. Moderate to severe mitral valve regurgitation. No evidence of mitral valve stenosis. Tricuspid Valve: The tricuspid valve is normal in structure. Tricuspid valve regurgitation is mild . No evidence of tricuspid stenosis. Aortic Valve: The aortic valve is normal in structure. Aortic valve regurgitation is not visualized. No aortic stenosis is present. Aortic valve peak gradient measures 10.1 mmHg. Pulmonic Valve: There is midsystolic notching of the RVOT waveform, generally considered specific for severely elevated pulmonary vascular resistance. The pulmonic valve was normal in structure. Pulmonic valve regurgitation is not visualized. No evidence  of pulmonic stenosis. Aorta: The  aortic root is normal in size and structure. Venous: The inferior vena cava is dilated in size with less than 50% respiratory variability, suggesting right atrial pressure of 15 mmHg. IAS/Shunts: No atrial level s

## 2021-06-04 NOTE — Progress Notes (Signed)
?PROGRESS NOTE ? ? ? ?Jerry Edwards  DZH:[id number] DOB: 1971-01-25 DOA: 06/02/2021 ?PCP: Jerry Pounds, NP  ?Edwards 51/M with history of nonischemic cardiomyopathy, chronic systolic CHF, EF 41%, chronic respiratory failure on as needed home O2, pulmonary hypertension, CKD 4 ,osteogenesis imperfecta with lower leg deformity, wheelchair-bound was admitted with worsening dyspnea, PND orthopnea, blood pressure> 200 on arrival, chest x-ray noted bilateral interstitial infiltrates, BNP >4500 ?-Improving with diuresis, noted to have mild worsening AKI on CKD 4 ? ?Subjective: Feels better overall, breathing is improving, surprised to know that Jerry Edwards has CKD ? ?Assessment and Plan: ? ?Acute on chronic systolic heart failure ([place]) ?Moderate to severe MR ?Echocardiogram with LV EF 30 to 35%, mild PAH, mod to severe MR ?-Multiple hospitalizations with CHF exacerbation ?-Diuresing on IV Lasix, Jerry Edwards is 3.9 L negative ?-Plan for right heart cath today ?-Remains on carvedilol, hydralazine and Imdur ?-Anticipate need for TEE to assess severity of MR as well ? ?Acute on chronic hypoxic respiratory failure ?OSA OHS ?-Required BiPAP on admission, now off ?-Continue diuretics as above and CPAP for OSA ?-Has been on oxygen off-and-on recently ? ?AKI on CKD 3B ?-Baseline creatinine around 2-2.5, now 3.1 ?-Had been on Entresto prior to admission, since discontinued ?-Suspect this is hemodynamically mediated in the setting of acute CHF and MR ?-Right heart cath today to assess hemodynamics, monitor urine output and kidney function closely ?-Jerry Edwards will need close nephrology follow-up and referral ? ?HTN (hypertension) ?Continue carvedilol, hydralazine and isosorbide ? ?Type 2 diabetes mellitus ([name]) ?-CBGs are stable, continue sliding scale insulin ?-Avoid SGLT2i in the setting of AKI/CKD ? ?Osteogenesis imperfecta ?-With lower extremity deformities, wheelchair-bound at baseline ? 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 [name] in April ? ?Class 3 obesity (HCC) ?Calculated BMI is 42,2 ? ?DVT prophylaxis: Add Lovenox ?Code Status: Full code ?Family Communication: Discussed patient in detail, no family at bedside ?Disposition Plan: Home pending improvement in volume status ? ?Consultants:  ?Heart failure ? ?Procedures:  ? ?Antimicrobials:  ? ? ?Objective: ?Vitals:  ? 06/04/21 0906 06/04/21 0911 06/04/21 0916 06/04/21 0944  ?BP: (!) 151/90 139/75 137/79 (!) 140/93  ?Pulse: 67 66 73 67  ?Resp: 18 19 (!) 22 20  ?Temp:    98.1 ?F (36.7 ?C)  ?TempSrc:    Oral  ?SpO2: 93% 94% 92% 93%  ?Weight:      ?Height:      ? ? ?Intake/Output Summary (Last 24 hours) at 06/04/2021 1043 ?Last data filed at 06/04/2021 0300 ?Gross per 24 hour  ?Intake 246 ml  ?Output 2162 ml  ?Net -1916 ml  ? ?Filed Weights  ? 06/02/21 1739 06/03/21 0323 06/04/21 0416  ?Weight: 95.2 kg 94.8 kg 93.3 kg  ? ? ?Examination: ? ?General exam: Obese chronically ill male sitting up in bed, AAOx3, no distress ?HEENT: Neck obese, unable to assess JVD, right upper eyelid everted and thickened ?CVS: S1-S2, regular rhythm, systolic murmur ?Lungs: Clear bilaterally, decreased at the bases ?Abdomen: Soft, obese, nontender, ?Extremities: No edema, bilateral lower leg deformity  ?Skin: No rashes ?Psychiatry: Judgement and insight appear normal. Mood & affect appropriate.  ? ? ? ?Data Reviewed:  ? ?CBC: ?Recent Labs  ?Lab 06/02/21 ?1223  ?WBC 4.7  ?NEUTROABS 3.5  ?HGB 11.7*  ?HCT 39.9  ?MCV 98.3  ?PLT 177  ? ?Basic Metabolic Panel: ?Recent Labs  ?Lab 06/02/21 ?1223 06/03/21 ?0716 06/03/21 ?1108 06/04/21 ?0322  ?NA  146* 144  --  143  ?K 3.7 3.6  --  3.6  ?CL 111 109  --  106  ?CO2 25 26  --  23  ?GLUCOSE 118* 107*  --  84  ?BUN 32* 40*  --  46*  ?CREATININE 3.15* 3.33*  --  3.16*  ?CALCIUM 8.6* 8.7*  --  8.3*  ?MG 2.0  --  2.0 2.0  ? ?GFR: ?Estimated Creatinine Clearance: 25.8 mL/min (A) (by C-G formula  based on SCr of 3.16 mg/dL (H)). ?Liver Function Tests: ?Recent Labs  ?Lab 06/02/21 ?1223  ?AST 30  ?ALT 23  ?ALKPHOS 54  ?BILITOT 0.7  ?PROT 6.7  ?ALBUMIN 2.8*  ? ?No results for input(s): LIPASE, AMYLASE in the last 168 hours. ?No results for input(s): AMMONIA in the last 168 hours. ?Coagulation Profile: ?No results for input(s): INR, PROTIME in the last 168 hours. ?Cardiac Enzymes: ?No results for input(s): CKTOTAL, CKMB, CKMBINDEX, TROPONINI in the last 168 hours. ?BNP (last 3 results) ?No results for input(s): PROBNP in the last 8760 hours. ?HbA1C: ?Recent Labs  ?  06/02/21 ?1527  ?HGBA1C 4.8  ? ?CBG: ?Recent Labs  ?Lab 06/03/21 ?0616 06/03/21 ?1140 06/03/21 ?1555 06/03/21 ?2114 06/04/21 ?0762  ?GLUCAP 97 117* 100* 142* 76  ? ?Lipid Profile: ?No results for input(s): CHOL, HDL, LDLCALC, TRIG, CHOLHDL, LDLDIRECT in the last 72 hours. ?Thyroid Function Tests: ?No results for input(s): TSH, T4TOTAL, FREET4, T3FREE, THYROIDAB in the last 72 hours. ?Anemia Panel: ?No results for input(s): VITAMINB12, FOLATE, FERRITIN, TIBC, IRON, RETICCTPCT in the last 72 hours. ?Urine analysis: ?   ?Component Value Date/Time  ? [place] YELLOW 05/26/2011 2259  ? APPEARANCEUR CLEAR 05/26/2011 2259  ? LABSPEC 1.028 05/26/2011 2259  ? PHURINE 6.5 05/26/2011 2259  ? GLUCOSEU NEGATIVE 05/26/2011 2259  ? [place] NEGATIVE 05/26/2011 2259  ? [place] NEGATIVE 05/26/2011 2259  ? [place] NEGATIVE 05/26/2011 2259  ? PROTEINUR 30 (A) 05/26/2011 2259  ? UROBILINOGEN 1.0 05/26/2011 2259  ? NITRITE NEGATIVE 05/26/2011 2259  ? LEUKOCYTESUR NEGATIVE 05/26/2011 2259  ? ?Sepsis Labs: ?'@LABRCNTIP'$ (procalcitonin:4,lacticidven:4) ? ?) ?Recent Results (from the past 240 hour(s))  ?Resp Panel by RT-PCR (Flu A&B, Covid) Nasopharyngeal Swab     Status: None  ? Collection Time: 06/02/21 12:25 PM  ? Specimen: Nasopharyngeal Swab; Nasopharyngeal(NP) swabs in vial transport medium  ?Result Value Ref Range Status  ? SARS Coronavirus 2 by RT PCR NEGATIVE  NEGATIVE Final  ?  Comment: (NOTE) ?SARS-CoV-2 target nucleic acids are NOT DETECTED. ? ?The SARS-CoV-2 RNA is generally detectable in upper respiratory ?specimens during the acute phase of infection. The lowest ?concentration of SARS-CoV-2 viral copies this assay can detect is ?138 copies/mL. A negative result does not preclude SARS-Cov-2 ?infection and should not be used as the sole basis for treatment or ?other patient management decisions. A negative result may occur with  ?improper specimen collection/handling, submission of specimen other ?than nasopharyngeal swab, presence of viral mutation(s) within the ?areas targeted by this assay, and inadequate number of viral ?copies(<138 copies/mL). A negative result must be combined with ?clinical observations, patient history, and epidemiological ?information. The expected result is Negative. ? ?Fact Sheet for Patients:  ?EntrepreneurPulse.com.au ? ?Fact Sheet for Healthcare Providers:  ?IncredibleEmployment.be ? ?This test is no t yet approved or cleared by the [place] FDA and  ?has been authorized for detection and/or diagnosis of SARS-CoV-2 by ?FDA under an Emergency Use Authorization (EUA). This EUA will remain  ?in effect (meaning this test can be used)  for the duration of the ?COVID-19 declaration under Section 564(b)(1) of the Act, 21 ?U.S.C.section 360bbb-3(b)(1), unless the authorization is terminated  ?or revoked sooner.  ? ? ?  ? Influenza A by PCR NEGATIVE NEGATIVE Final  ? Influenza B by PCR NEGATIVE NEGATIVE Final  ?  Comment: (NOTE) ?The Xpert Xpress SARS-CoV-2/FLU/RSV plus assay is intended as an aid ?in the diagnosis of influenza from Nasopharyngeal swab specimens and ?should not be used as a sole basis for treatment. Nasal washings and ?aspirates are unacceptable for Xpert Xpress SARS-CoV-2/FLU/RSV ?testing. ? ?Fact Sheet for Patients: ?EntrepreneurPulse.com.au ? ?Fact Sheet for Healthcare  Providers: ?IncredibleEmployment.be ? ?This test is not yet approved or cleared by the [place] FDA and ?has been authorized for detection and/or diagnosis of SARS-CoV-2 by ?FDA unde

## 2021-06-04 NOTE — Progress Notes (Addendum)
? ? Advanced Heart Failure Rounding Note ? ?PCP-Cardiologist: None  ? ?Subjective:   ? ?-2.4 L in UOP w/ IV Lasix yesterday. Wt down 4 lb.  ? ?SCr 3.15>>3.33>>3.16 ? ?Echo: LVEF 30-35% (stable). Severe MR (new)  ? ?On for RHC today  ? ?No complaints this morning. Sleep ok last night. Sleeping w/ BiPAP. No dyspnea. Denies CP.  ? ?Objective:   ?Weight Range: ?93.3 kg ?Body mass index is 41.54 kg/m?.  ? ?Vital Signs:   ?Temp:  [97.4 ?F (36.3 ?C)-97.9 ?F (36.6 ?C)] 97.4 ?F (36.3 ?C) (03/15 0416) ?Pulse Rate:  [58-84] 62 (03/15 0416) ?Resp:  [14-20] 19 (03/15 0416) ?BP: (126-143)/(82-93) 139/93 (03/15 0416) ?SpO2:  [93 %-99 %] 98 % (03/15 0416) ?Weight:  [93.3 kg] 93.3 kg (03/15 0416) ?Last BM Date : 06/02/21 ? ?Weight change: ?Filed Weights  ? 06/02/21 1739 06/03/21 0323 06/04/21 0416  ?Weight: 95.2 kg 94.8 kg 93.3 kg  ? ? ?Intake/Output:  ? ?Intake/Output Summary (Last 24 hours) at 06/04/2021 0742 ?Last data filed at 06/04/2021 0300 ?Gross per 24 hour  ?Intake 246 ml  ?Output 2362 ml  ?Net -2116 ml  ?  ? ? ?Physical Exam  ?  ?General:  moderately obese. No respiratory difficulty ?HEENT: normal ?Neck: supple. Thick neck, JVD not well visualized. Carotids 2+ bilat; no bruits. No lymphadenopathy or thyromegaly appreciated. ?Cor: PMI nondisplaced. Regular rate & rhythm.3/6 MR murmur  ?Lungs: decreased BS at the bases bilaterally  ?Abdomen: obese, soft, nontender, nondistended. No hepatosplenomegaly. No bruits or masses. Good bowel sounds. ?Extremities: no cyanosis, clubbing, rash, edema, b/l LE deformity (osteogenesis imperfecta)   ?Neuro: alert & oriented x 3, cranial nerves grossly intact. moves all 4 extremities w/o difficulty. Affect pleasant. ? ? ?Telemetry  ? ?NSR/ SBPs upper 50s-60s  personally reviewed  ? ?EKG  ?  ?N/A  ? ?Labs  ?  ?CBC ?Recent Labs  ?  06/02/21 ?1223  ?WBC 4.7  ?NEUTROABS 3.5  ?HGB 11.7*  ?HCT 39.9  ?MCV 98.3  ?PLT 177  ? ?Basic Metabolic Panel ?Recent Labs  ?  06/03/21 ?0716 06/03/21 ?1108  06/04/21 ?0322  ?NA 144  --  143  ?K 3.6  --  3.6  ?CL 109  --  106  ?CO2 26  --  23  ?GLUCOSE 107*  --  84  ?BUN 40*  --  46*  ?CREATININE 3.33*  --  3.16*  ?CALCIUM 8.7*  --  8.3*  ?MG  --  2.0 2.0  ? ?Liver Function Tests ?Recent Labs  ?  06/02/21 ?1223  ?AST 30  ?ALT 23  ?ALKPHOS 54  ?BILITOT 0.7  ?PROT 6.7  ?ALBUMIN 2.8*  ? ?No results for input(s): LIPASE, AMYLASE in the last 72 hours. ?Cardiac Enzymes ?No results for input(s): CKTOTAL, CKMB, CKMBINDEX, TROPONINI in the last 72 hours. ? ?BNP: ?BNP (last 3 results) ?Recent Labs  ?  02/08/21 ?2110 05/21/21 ?1153 06/02/21 ?1223  ?BNP 585.1* 1,681.3* >4,500.0*  ? ? ?ProBNP (last 3 results) ?No results for input(s): PROBNP in the last 8760 hours. ? ? ?D-Dimer ?No results for input(s): DDIMER in the last 72 hours. ?Hemoglobin A1C ?Recent Labs  ?  06/02/21 ?1527  ?HGBA1C 4.8  ? ?Fasting Lipid Panel ?No results for input(s): CHOL, HDL, LDLCALC, TRIG, CHOLHDL, LDLDIRECT in the last 72 hours. ?Thyroid Function Tests ?No results for input(s): TSH, T4TOTAL, T3FREE, THYROIDAB in the last 72 hours. ? ?Invalid input(s): FREET3 ? ?Other results: ? ? ?Imaging  ? ? ?ECHOCARDIOGRAM COMPLETE ? ?  Result Date: 06/03/2021 ?   ECHOCARDIOGRAM REPORT   Patient Name:   Jerry Edwards Date of Exam: 06/03/2021 Medical Rec #:  [id number]     Height:       59.0 in Accession #:    [id number]    Weight:       209.0 lb Date of Birth:  06-Feb-1971     BSA:          1.879 m? Patient Age:    51 years      BP:           153/95 mmHg Patient Gender: M             HR:           67 bpm. Exam Location:  Inpatient Procedure: 2D Echo, Cardiac Doppler, Color Doppler and Intracardiac            Opacification Agent Indications:    CHF  History:        Patient has prior history of Echocardiogram examinations. CHF;                 Risk Factors:Hypertension and Sleep Apnea.  Sonographer:    Jyl Heinz Referring Phys: [phone] LINDSAY NICOLE Buckland  1. No left ventricular thrombus is seen (Definity was not  administered). Left ventricular ejection fraction, by estimation, is 30 to 35%. The left ventricle has moderately decreased function. The left ventricle has no regional wall motion abnormalities.  The left ventricular internal cavity size was moderately dilated. Left ventricular diastolic parameters are consistent with Grade II diastolic dysfunction (pseudonormalization).  2. Right ventricular systolic function is normal. The right ventricular size is normal. There is mildly elevated pulmonary artery systolic pressure.  3. The mitral valve is normal in structure. Moderate to severe mitral valve regurgitation. No evidence of mitral stenosis.  4. The aortic valve is normal in structure. Aortic valve regurgitation is not visualized. No aortic stenosis is present.  5. There is midsystolic notching of the RVOT waveform, generally considered specific for severely elevated pulmonary vascular resistance.  6. The inferior vena cava is dilated in size with <50% respiratory variability, suggesting right atrial pressure of 15 mmHg. Comparison(s): The left ventricular function is significantly worse. The left ventricular diastolic function is significantly worse. The left ventricular wall motion abnormalities are significantly worse. 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 There is new inferior wall hypokinesis. Mitral insufficiency is new and may be severe. There is evidence of elevation in left and right atrial pressures, new from the previous study. Although PA pressure is probably underestimated due to poor TR jet density, there is notching of the RVOT systolic VTI, consistent with markedly elevated PA pressure. FINDINGS  Left Ventricle: No left ventricular thrombus is seen (Definity was not administered). Left ventricular ejection fraction, by estimation, is 30 to 35%. The left ventricle has moderately decreased function. The left ventricle has no regional wall motion  abnormalities. The left ventricular internal cavity size was moderately dilated. There is no left ventricular hypertrophy. Left ventricular diastolic parameters are consistent with Grade II diastolic dysfunction (pseudonormalization).  LV Wall Scoring: The apical septal segment, apical inferior segment, and apex are dyskinetic. The mid and distal anterior wall, mid anteroseptal segment, and apical lateral segment are akinetic. The inferior wall is hypokinetic. The antero-lateral wall, posterior wall, basal anteroseptal segment, mid inferoseptal segment, basal anterior segment, and basal inferoseptal segment are normal. Right Ventricle: The right ventricular  size is normal. No increase in right ventricular wall thickness. Right ventricular systolic function is normal. There is mildly elevated pulmonary artery systolic pressure. The tricuspid regurgitant velocity is 2.61  m/s, and with an assumed right atrial pressure of 15 mmHg, the estimated right ventricular systolic pressure is 13.2 mmHg. Left Atrium: Left atrial size was normal in size. Right Atrium: Right atrial size was normal in size. Pericardium: There is no evidence of pericardial effusion. Mitral Valve: The mitral valve is normal in structure. Mild mitral annular calcification. Moderate to severe mitral valve regurgitation. No evidence of mitral valve stenosis. Tricuspid Valve: The tricuspid valve is normal in structure. Tricuspid valve regurgitation is mild . No evidence of tricuspid stenosis. Aortic Valve: The aortic valve is normal in structure. Aortic valve regurgitation is not visualized. No aortic stenosis is present. Aortic valve peak gradient measures 10.1 mmHg. Pulmonic Valve: There is midsystolic notching of the RVOT waveform, generally considered specific for severely elevated pulmonary vascular resistance. The pulmonic valve was normal in structure. Pulmonic valve regurgitation is not visualized. No evidence  of pulmonic stenosis. Aorta: The  aortic root is normal in size and structure. Venous: The inferior vena cava is dilated in size with less than 50% respiratory variability, suggesting right atrial pressure of 15 mmHg. IAS/Shunts: No atrial level s

## 2021-06-04 NOTE — Interval H&P Note (Signed)
History and Physical Interval Note: ? ?06/04/2021 ?8:35 AM ? ?Jerry Edwards  has presented today for surgery, with the diagnosis of heart failure.  The various methods of treatment have been discussed with the patient and family. After consideration of risks, benefits and other options for treatment, the patient has consented to  Procedure(s): ?RIGHT HEART CATH (N/A) as a surgical intervention.  The patient's history has been reviewed, patient examined, no change in status, stable for surgery.  I have reviewed the patient's chart and labs.  Questions were answered to the patient's satisfaction.   ? ? ?Severiano Utsey ? ? ?

## 2021-06-05 ENCOUNTER — Inpatient Hospital Stay (HOSPITAL_COMMUNITY): Payer: Medicare Other | Admitting: Critical Care Medicine

## 2021-06-05 ENCOUNTER — Encounter (HOSPITAL_COMMUNITY): Payer: Self-pay | Admitting: Internal Medicine

## 2021-06-05 ENCOUNTER — Inpatient Hospital Stay (HOSPITAL_COMMUNITY): Payer: Medicare Other

## 2021-06-05 ENCOUNTER — Encounter (HOSPITAL_COMMUNITY)
Admission: EM | Disposition: A | Discharge status: Home / Self Care | Payer: Self-pay | Source: Home / Self Care | Attending: Internal Medicine

## 2021-06-05 DIAGNOSIS — I130 Hypertensive heart and chronic kidney disease with heart failure and stage 1 through stage 4 chronic kidney disease, or unspecified chronic kidney disease: Secondary | ICD-10-CM

## 2021-06-05 DIAGNOSIS — I340 Nonrheumatic mitral (valve) insufficiency: Secondary | ICD-10-CM

## 2021-06-05 DIAGNOSIS — I5043 Acute on chronic combined systolic (congestive) and diastolic (congestive) heart failure: Secondary | ICD-10-CM

## 2021-06-05 DIAGNOSIS — E1122 Type 2 diabetes mellitus with diabetic chronic kidney disease: Secondary | ICD-10-CM

## 2021-06-05 DIAGNOSIS — N183 Chronic kidney disease, stage 3 unspecified: Secondary | ICD-10-CM

## 2021-06-05 HISTORY — PX: TEE WITHOUT CARDIOVERSION: SHX5443

## 2021-06-05 LAB — GLUCOSE, CAPILLARY
Glucose-Capillary: 87 mg/dL (ref 70–99)
Glucose-Capillary: 79 mg/dL (ref 70–99)
Glucose-Capillary: 81 mg/dL (ref 70–99)
Glucose-Capillary: 110 mg/dL — ABNORMAL HIGH (ref 70–99)
Glucose-Capillary: 61 mg/dL — ABNORMAL LOW (ref 70–99)

## 2021-06-05 LAB — BASIC METABOLIC PANEL
Anion gap: 10 (ref 5–15)
BUN: 51 mg/dL — ABNORMAL HIGH (ref 6–20)
CO2: 26 mmol/L (ref 22–32)
Calcium: 8.5 mg/dL — ABNORMAL LOW (ref 8.9–10.3)
Chloride: 107 mmol/L (ref 98–111)
Creatinine, Ser: 3.14 mg/dL — ABNORMAL HIGH (ref 0.61–1.24)
GFR, Estimated: 23 mL/min — ABNORMAL LOW (ref >60)
Glucose, Bld: 85 mg/dL (ref 70–99)
Potassium: 3.6 mmol/L (ref 3.5–5.1)
Sodium: 143 mmol/L (ref 135–145)

## 2021-06-05 SURGERY — ECHOCARDIOGRAM, TRANSESOPHAGEAL
Anesthesia: General

## 2021-06-05 MED ORDER — ISOSORBIDE MONONITRATE ER 30 MG PO TB24
30.0000 mg | ORAL_TABLET | Freq: Every day | ORAL | Status: DC
  Administered 2021-06-05 – 2021-06-06 (×2): 30 mg via ORAL
  Filled 2021-06-05 (×2): qty 1

## 2021-06-05 MED ORDER — PHENYLEPHRINE 40 MCG/ML (10ML) SYRINGE FOR IV PUSH (FOR BLOOD PRESSURE SUPPORT)
PREFILLED_SYRINGE | INTRAVENOUS | Status: DC | PRN
  Administered 2021-06-05: 80 ug via INTRAVENOUS
  Administered 2021-06-05: 160 ug via INTRAVENOUS
  Administered 2021-06-05 (×2): 80 ug via INTRAVENOUS
  Administered 2021-06-05: 160 ug via INTRAVENOUS
  Administered 2021-06-05 (×5): 80 ug via INTRAVENOUS
  Administered 2021-06-05: 120 ug via INTRAVENOUS

## 2021-06-05 MED ORDER — HYDRALAZINE HCL 50 MG PO TABS
50.0000 mg | ORAL_TABLET | Freq: Three times a day (TID) | ORAL | Status: DC
  Administered 2021-06-05 – 2021-06-06 (×4): 50 mg via ORAL
  Filled 2021-06-05 (×4): qty 1

## 2021-06-05 MED ORDER — PHENYLEPHRINE HCL (PRESSORS) 10 MG/ML IV SOLN: INTRAVENOUS | Status: DC | PRN

## 2021-06-05 MED ORDER — PHENOL 1.4 % MT LIQD
1.0000 | OROMUCOSAL | Status: DC | PRN
  Filled 2021-06-05: qty 177

## 2021-06-05 MED ORDER — EPHEDRINE SULFATE-NACL 50-0.9 MG/10ML-% IV SOSY
PREFILLED_SYRINGE | INTRAVENOUS | Status: DC | PRN
  Administered 2021-06-05 (×2): 5 mg via INTRAVENOUS

## 2021-06-05 MED ORDER — LIDOCAINE 2% (20 MG/ML) 5 ML SYRINGE
INTRAMUSCULAR | Status: DC | PRN
  Administered 2021-06-05: 60 mg via INTRAVENOUS

## 2021-06-05 MED ORDER — SUCCINYLCHOLINE CHLORIDE 200 MG/10ML IV SOSY
PREFILLED_SYRINGE | INTRAVENOUS | Status: DC | PRN
  Administered 2021-06-05: 160 mg via INTRAVENOUS

## 2021-06-05 MED ORDER — PROPOFOL 10 MG/ML IV BOLUS
INTRAVENOUS | Status: DC | PRN
  Administered 2021-06-05: 80 mg via INTRAVENOUS
  Administered 2021-06-05: 20 mg via INTRAVENOUS

## 2021-06-05 MED ORDER — POTASSIUM CHLORIDE CRYS ER 20 MEQ PO TBCR
40.0000 meq | EXTENDED_RELEASE_TABLET | Freq: Once | ORAL | Status: AC
  Administered 2021-06-05: 40 meq via ORAL
  Filled 2021-06-05: qty 2

## 2021-06-05 MED ORDER — PHENYLEPHRINE HCL-NACL 20-0.9 MG/250ML-% IV SOLN
INTRAVENOUS | Status: DC | PRN
  Administered 2021-06-05: 75 ug/min via INTRAVENOUS

## 2021-06-05 NOTE — Progress Notes (Addendum)
? ? Advanced Heart Failure Rounding Note ? ?PCP-Cardiologist: None  ? ?Subjective:   ? ?3/15 RHC with mildly elevated filling pressures. RA 9, PCWP 21, PVR 2.6, CO 7.4, and CI 4.  ? ?SCr 3.14, stable.  ? ?Echo: LVEF 30-35% (stable). Severe MR (new)  ? ? ?Denies SOB  ? ? ?Objective:   ?Weight Range: ?94.5 kg ?Body mass index is 42.08 kg/m?.  ? ?Vital Signs:   ?Temp:  [98 ?F (36.7 ?C)-98.5 ?F (36.9 ?C)] 98.4 ?F (36.9 ?C) (03/16 0258) ?Pulse Rate:  [58-86] 77 (03/16 0304) ?Resp:  [16-22] 16 (03/16 0304) ?BP: (113-155)/(67-93) 117/84 (03/16 0258) ?SpO2:  [92 %-100 %] 97 % (03/16 0304) ?Weight:  [94.5 kg] 94.5 kg (03/16 0258) ?Last BM Date : 06/03/21 ? ?Weight change: ?Filed Weights  ? 06/03/21 0323 06/04/21 0416 06/05/21 0258  ?Weight: 94.8 kg 93.3 kg 94.5 kg  ? ? ?Intake/Output:  ? ?Intake/Output Summary (Last 24 hours) at 06/05/2021 0847 ?Last data filed at 06/05/2021 [phone] ?Gross per 24 hour  ?Intake 361 ml  ?Output 2350 ml  ?Net -1989 ml  ?  ? ? ?Physical Exam  ?  ?General:  No resp difficulty ?HEENT: normal ?Neck: supple. JVP difficult to assess.  Carotids 2+ bilat; no bruits. No lymphadenopathy or thryomegaly appreciated. ?Cor: PMI nondisplaced. Regular rate & rhythm. No rubs, gallops. 3/6 MR  ?Lungs: clear ?Abdomen: soft, nontender, distended. No hepatosplenomegaly. No bruits or masses. Good bowel sounds. ?Extremities: no cyanosis, clubbing, rash, edema ?Neuro: alert & orientedx3, cranial nerves grossly intact. moves all 4 extremities w/o difficulty. Affect pleasant ? ? ? ?Telemetry  ? ?SB-SB 50-80s  ? ?EKG  ?  ?N/A  ? ?Labs  ?  ?CBC ?Recent Labs  ?  06/02/21 ?1223 06/04/21 ?3762 06/04/21 ?0918  ?WBC 4.7  --   --   ?NEUTROABS 3.5  --   --   ?HGB 11.7* 13.3  13.3 12.9*  ?HCT 39.9 39.0  39.0 38.0*  ?MCV 98.3  --   --   ?PLT 177  --   --   ? ?Basic Metabolic Panel ?Recent Labs  ?  06/03/21 ?1108 06/04/21 ?0322 06/04/21 ?8315 06/04/21 ?1761 06/05/21 ?0418  ?NA  --  143   < > 146* 143  ?K  --  3.6   < > 3.7 3.6  ?CL   --  106  --   --  107  ?CO2  --  23  --   --  26  ?GLUCOSE  --  84  --   --  85  ?BUN  --  46*  --   --  51*  ?CREATININE  --  3.16*  --   --  3.14*  ?CALCIUM  --  8.3*  --   --  8.5*  ?MG 2.0 2.0  --   --   --   ? < > = values in this interval not displayed.  ? ?Liver Function Tests ?Recent Labs  ?  06/02/21 ?1223  ?AST 30  ?ALT 23  ?ALKPHOS 54  ?BILITOT 0.7  ?PROT 6.7  ?ALBUMIN 2.8*  ? ?No results for input(s): LIPASE, AMYLASE in the last 72 hours. ?Cardiac Enzymes ?No results for input(s): CKTOTAL, CKMB, CKMBINDEX, TROPONINI in the last 72 hours. ? ?BNP: ?BNP (last 3 results) ?Recent Labs  ?  02/08/21 ?2110 05/21/21 ?1153 06/02/21 ?1223  ?BNP 585.1* 1,681.3* >4,500.0*  ? ? ?ProBNP (last 3 results) ?No results for input(s): PROBNP in the last 8760 hours. ? ? ?  D-Dimer ?No results for input(s): DDIMER in the last 72 hours. ?Hemoglobin A1C ?Recent Labs  ?  06/02/21 ?1527  ?HGBA1C 4.8  ? ?Fasting Lipid Panel ?No results for input(s): CHOL, HDL, LDLCALC, TRIG, CHOLHDL, LDLDIRECT in the last 72 hours. ?Thyroid Function Tests ?No results for input(s): TSH, T4TOTAL, T3FREE, THYROIDAB in the last 72 hours. ? ?Invalid input(s): FREET3 ? ?Other results: ? ? ?Imaging  ? ? ?CARDIAC CATHETERIZATION ? ?Result Date: 06/04/2021 ?Findings: RA = 9 RV = 58/14 PA = 61/30 (40) PCW = 21 (no significant v-waves) Fick cardiac output/index = 7.4/4.0 PVR = 2.6 WU FA sat = 92% PA sat = 69%, 72% SVC sat = 74% Assessment: 1. Mild volume overload 2. Moderate mixed pulmonary HTN 3. No evidence of significant v-waves in PCWP tracing 4. High cardiac output Plan/Discussion: Continue IV diuresis. Plan TEE tomorrow to look more closely at MV. Glori Bickers, MD 9:30 AM  ? ? ?Medications:   ? ? ?Scheduled Medications: ? aspirin EC  81 mg Oral Daily  ? carvedilol  6.25 mg Oral BID WC  ? doxazosin  1 mg Oral QHS  ? erythromycin  1 application. Right Eye QHS  ? furosemide  80 mg Intravenous BID  ? hydrALAZINE  50 mg Oral TID  ? insulin aspart  0-9 Units  Subcutaneous TID WC  ? isosorbide mononitrate  30 mg Oral Daily  ? olopatadine  1 drop Both Eyes BID  ? potassium chloride  40 mEq Oral Once  ? sodium chloride flush  3 mL Intravenous Q12H  ? sodium chloride flush  3 mL Intravenous Q12H  ? sodium chloride flush  3 mL Intravenous Q12H  ? ? ?Infusions: ? sodium chloride    ? sodium chloride    ? ? ?PRN Medications: ?sodium chloride, sodium chloride, acetaminophen, ondansetron (ZOFRAN) IV, sodium chloride flush, sodium chloride flush ? ? ?Assessment/Plan  ? ?1. A/C HFrEF  ?- unable to fit in cMRI machine due to body stature.  PYP scan 8/18 no amyloid ?- Echo 09/2017: EF 25-30%, grade 2 DD ?- Echo 10/19: EF  30-35% ?- Echo 5/21 EF 35% ?- Echo 11/22 35-40% ?- Echo this admit EF 30-35%.  ?-Recently started Jardiance and lasix decreased. Second visit to ED in the last month with CHF. ?- RHC with mildly elevated filling pressures.  ?- Volume status improving. Continue IV lasix today. Consider po tomorrow.   ?- Continue coreg 6.25 mg BID ?- Hold Entresto, spiro and Jardiance w/ elevated SCr  ?- Continue hydralazine 100 mg TID. ?- Continue Imdur 60 mg ?_ hold off on SGLT2i ?  ?2. Acute on Chronic respiratory failure ?- In setting of acute on chronic CHF. Off BiPAP, O2 now stable on 2L [place] ?- Has known OSA on CPAP.  ?  ?2. Osteogenesis imperfecta  ?- Wheel chair bound; limited mobility.  ?- No change  ?  ?3. Hypertensive urgency ?- off NTG drip ?- SBPs now 130s-140s ?- Continue GDMT titration per above  ?- May need to add amlodipine.  ?  ?4. OSA  ?- Wears O2 at night.  ?- Had PSG on 11/07/17 with AHI 88/hr c/w very severe OSA.  ?- Follows with Dr. Radford Pax, reports CPAP compliance. ?- Using CPAP when napping.  ?  ?5. Morbid Obesity  ?-Body mass index is 42.08 kg/m?. ?  ?6. Pulmonary hypertension ?- This appears to be primarily pulmonary venous hypertension on previous RHC.  There may be a component of PAH related to OSA.   ?-  No change.  ?  ?7. AKI on CKD ?-Scr 2.1>>3 over last 4  months ?-Creatinine 3.14 today , stable for the few days.  ? ?8.MR, Severe  ?- TEE today to further assess.  ? ? ?Length of Stay: 2 ? ?Darrick Grinder, NP  ?06/05/2021, 8:47 AM ? ?Advanced Heart Failure Team ?Pager [phone] (M-F; 7a - 5p)  ?Please contact [facility] for night-coverage after hours (5p -7a ) and weekends on [email or web address] ? ? ?Patient seen and examined with the above-signed Advanced Practice Provider and/or Housestaff. I personally reviewed laboratory data, imaging studies and relevant notes. I independently examined the patient and formulated the important aspects of the plan. I have edited the note to reflect any of my changes or salient points. I have personally discussed the plan with the patient and/or family. ? ?Fisher numbers reviewed with him.  ? ?Feels ok. Continue to diurese. Denies CP or SOB.  Renal function stable.  ? ?General:  Sitting up in bed No resp difficulty ?HEENT: normal ?Neck: supple. Hard to see jvp  Carotids 2+ bilat; no bruits. No lymphadenopathy or thryomegaly appreciated. ?Cor: Barrel chested Regular rate & rhythm. 3/6 MR. ?Lungs: clear ?Abdomen: obese soft, nontender, nondistended. No hepatosplenomegaly. No bruits or masses. Good bowel sounds. ?Extremities: no cyanosis, clubbing, rash, edema ?Neuro: alert & orientedx3, cranial nerves grossly intact. moves all 4 extremities w/o difficulty. Affect pleasant ? ?Plan TEE today to further evaluate MR. He understands that he may need to be intubated for the procedure for safety. Will await anesthesia eval.  ? ?Glori Bickers, MD  ?9:24 AM ? ? ? ?

## 2021-06-05 NOTE — Progress Notes (Signed)
?  Echocardiogram ?Echocardiogram Transesophageal has been performed. ? ?Jerry Edwards ?06/05/2021, 1:09 PM ?

## 2021-06-05 NOTE — Progress Notes (Addendum)
Hypoglycemic Event ? ?CBG: 61 ? ?Treatment: 4oz juice or soda ? ?Symptoms: None ? ?Follow-up CBG: Time: 2210 CBG Result: 110 ? ?Possible Reasons for Event: Inadequate meal intake and Other: NPO for procedure  earlier in the day ? ?Comments/MD notified:protocol followed ? ? ? ?Jerry Edwards ? ? ?

## 2021-06-05 NOTE — CV Procedure (Signed)
? ? ?  TRANSESOPHAGEAL ECHOCARDIOGRAM  ? ?NAME:  Jerry Edwards   MRN: [id number] ?DOB:  02/28/1971   ADMIT DATE: 06/02/2021 ? ?INDICATIONS: ? ? ?PROCEDURE:  ? ?Informed consent was obtained prior to the procedure. The risks, benefits and alternatives for the procedure were discussed and the patient comprehended these risks.  Risks include, but are not limited to, cough, sore throat, vomiting, nausea, somnolence, esophageal and stomach trauma or perforation, bleeding, low blood pressure, aspiration, pneumonia, infection, trauma to the teeth and death.   ? ?After a procedural time-out, the patient was given intubated and sedated by the Anesthesia team.  The transesophageal probe was inserted in the esophagus and stomach without difficulty and multiple views were obtained.  ? ? ?Neo used to keep SBP 115-111m HG ? ?COMPLICATIONS:   ? ?There were no immediate complications. ? ?FINDINGS: ? ?LEFT VENTRICLE: EF = 30-55%. Anterior and anteroapical AK ? ?RIGHT VENTRICLE: Mild HK ? ?LEFT ATRIUM: Mildly dilated ? ?LEFT ATRIAL APPENDAGE: No thrombus.  ? ?RIGHT ATRIUM: Normal ? ?AORTIC VALVE:  Trileaflet. Trivial AI ? ?MITRAL VALVE:    Mild P2 restriction with 2-3+ (mod-severe central and posterior MR). No significant reversal of flow in PVs ? ?TRICUSPID VALVE: Normal. Mild TR ? ?PULMONIC VALVE: Grossly normal. ? ?INTERATRIAL SEPTUM: Lipomatous hypertrophy. No PFO or ASD. ? ?PERICARDIUM: Trivial effusion ? ?DESCENDING AORTA: Not well seen ? ? ? ?Nicci Vaughan,MD ?12:55 PM ?  ?

## 2021-06-05 NOTE — Consult Note (Addendum)
? ?  [facility] CM Inpatient Consult ? ? ?06/05/2021 ? ?Jerry Edwards ?12/13/1970 ?[id number] ? ?Late entry for 06/04/21 11:45 am ? ?[facility] [[facility]]  Accountable Care Organization [ACO] Patient: [employer] Medicare ? ?Primary care provider:  Fleming, Zelda W, NP [patient states this is no longer his provider] ? ?Addendum - 8:30 am New PCP:  Karrar Husain, MD, [facility] is checked in [facility] and shows this provider as PCP as well. ? ?9:00 am Attempt for obtaining a follow up TOC appointment with [facility] call received by unit secretary that patient has to re-establish with [facility] for follow up. ? ?06/04/21 11:45 am  -  Met with patient and mother at the beside to confirm his primary care provider for post hospital follow up needs.  Patient and his mother states patient is with Dr. Karrar Husain at [facility] [place].  Explained to patient this Embedded provider has a Chronic Care Management program he maybe eligible for. ? ?Plan:  This is an Embedded provider and will ask staff to update information in the electronic medical record for PCP, as appropriate. Spoke with inpatient unit secretary regarding updated information given from patient on PCP. ? ?For questions, please contact: ? ? , RN BSN CCM ?[facility] Hospital Liaison ? [phone] business mobile phone ?Toll free office [phone]  ?Fax number: [phone] [email or web address] ? ? ?

## 2021-06-05 NOTE — Interval H&P Note (Signed)
History and Physical Interval Note: ? ?06/05/2021 ?12:54 PM ? ?Jerry Edwards  has presented today for surgery, with the diagnosis of mitral regurgitation.  The various methods of treatment have been discussed with the patient and family. After consideration of risks, benefits and other options for treatment, the patient has consented to  Procedure(s): ?TRANSESOPHAGEAL ECHOCARDIOGRAM (TEE) (N/A) as a surgical intervention.  The patient's history has been reviewed, patient examined, no change in status, stable for surgery.  I have reviewed the patient's chart and labs.  Questions were answered to the patient's satisfaction.   ? ? ?Horacio Werth ? ? ?

## 2021-06-05 NOTE — Anesthesia Preprocedure Evaluation (Signed)
Anesthesia Evaluation  ?Patient identified by MRN, date of birth, ID band ?Patient awake ? ? ? ?Reviewed: ?Allergy & Precautions, NPO status , Patient's Chart, lab work & pertinent test results ? ?History of Anesthesia Complications ?Negative for: history of anesthetic complications ? ?Airway ?Mallampati: I ? ?TM Distance: >3 FB ?Neck ROM: Full ? ? ? Dental ? ?(+) Teeth Intact, Dental Advisory Given ?  ?Pulmonary ?shortness of breath, sleep apnea and Continuous Positive Airway Pressure Ventilation , neg COPD, former smoker,  ?  ?breath sounds clear to auscultation ? ? ? ? ? ? Cardiovascular ?hypertension, Pt. on medications and Pt. on home beta blockers ?(-) angina+ Peripheral Vascular Disease and +CHF  ? ?Rhythm:Regular  ?1. No left ventricular thrombus is seen (Definity was not administered).  ?Left ventricular ejection fraction, by estimation, is 30 to 35%. The left  ?ventricle has moderately decreased function. The left ventricle has no  ?regional wall motion abnormalities.  ??The left ventricular internal cavity size was moderately dilated. Left  ?ventricular diastolic parameters are consistent with Grade II diastolic  ?dysfunction (pseudonormalization).  ??2. Right ventricular systolic function is normal. The right ventricular  ?size is normal. There is mildly elevated pulmonary artery systolic  ?pressure.  ??3. The mitral valve is normal in structure. Moderate to severe mitral  ?valve regurgitation. No evidence of mitral stenosis.  ??4. The aortic valve is normal in structure. Aortic valve regurgitation is  ?not visualized. No aortic stenosis is present.  ??5. There is midsystolic notching of the RVOT waveform, generally  ?considered specific for severely elevated pulmonary vascular resistance.  ??6. The inferior vena cava is dilated in size with <50% respiratory  ?variability, suggesting right atrial pressure of 15 mmHg.  ? ?RA = 9 ?RV = 58/14 ?PA = 61/30 (40) ?PCW = 21 (no  significant v-waves) ?Fick cardiac output/index = 7.4/4.0 ?PVR = 2.6 WU ?FA sat = 92% ?PA sat = 69%, 72% ?SVC sat = 74% ?? ?Assessment: ?1. Mild volume overload ?2. Moderate mixed pulmonary HTN ?3. No evidence of significant v-waves in PCWP tracing ?4. High cardiac output ?? ?Plan/Discussion: ?? ?Continue IV diuresis. Plan TEE tomorrow to look more closely at MV.  ? ?  ?Neuro/Psych ?negative neurological ROS ? negative psych ROS  ? GI/Hepatic ?negative GI ROS, Neg liver ROS,   ?Endo/Other  ?diabetesMorbid obesityLab Results ?     Component                Value               Date                 ?     HGBA1C                   4.8                 06/02/2021           ? ? Renal/GU ?CRFRenal diseaseLab Results ?     Component                Value               Date                 ?     CREATININE               3.14 (H)            06/05/2021           ?  Lab Results ?     Component                Value               Date                 ?     K                        3.6                 06/05/2021           ? ?  ? ?  ?Musculoskeletal ?negative musculoskeletal ROS ?(+)  ? Abdominal ?  ?Peds ? Hematology ? ?(+) Blood dyscrasia, anemia , Lab Results ?     Component                Value               Date                 ?     WBC                      4.7                 06/02/2021           ?     HGB                      12.9 (L)            06/04/2021           ?     HCT                      38.0 (L)            06/04/2021           ?     MCV                      98.3                06/02/2021           ?     PLT                      177                 06/02/2021           ?   ?Anesthesia Other Findings ? ? Reproductive/Obstetrics ? ?  ? ? ? ? ? ? ? ? ? ? ? ? ? ?  ?  ? ? ? ? ? ? ? ?Anesthesia Physical ?Anesthesia Plan ? ?ASA: 3 ? ?Anesthesia Plan: General  ? ?Post-op Pain Management: Minimal or no pain anticipated  ? ?Induction: Intravenous ? ?PONV Risk Score and Plan: 2 and Ondansetron and Treatment may vary due to age or  medical condition ? ?Airway Management Planned: Oral ETT ? ?Additional Equipment: None ? ?Intra-op Plan:  ? ?Post-operative Plan: Extubation in OR ? ?Informed Consent: I have reviewed the patients History and Physical, chart, labs and discussed the procedure including the risks, benefits and alternatives for the proposed anesthesia with the patient or authorized representative who has indicated his/her understanding and acceptance.  ? ? ? ?  Dental advisory given ? ?Plan Discussed with: CRNA and Anesthesiologist ? ?Anesthesia Plan Comments:   ? ? ? ? ? ? ?Anesthesia Quick Evaluation ? ?

## 2021-06-05 NOTE — Anesthesia Procedure Notes (Signed)
Procedure Name: Intubation ?Date/Time: 06/05/2021 12:14 PM ?Performed by: Wilburn Cornelia, CRNA ?Pre-anesthesia Checklist: Patient identified, Emergency Drugs available, Suction available, Timeout performed and Patient being monitored ?Patient Re-evaluated:Patient Re-evaluated prior to induction ?Oxygen Delivery Method: Circle system utilized ?Preoxygenation: Pre-oxygenation with 100% oxygen ?Induction Type: IV induction ?Ventilation: Mask ventilation without difficulty ?Laryngoscope Size: Mac and 4 ?Grade View: Grade I ?Tube type: Oral ?Tube size: 7.5 mm ?Number of attempts: 1 ?Airway Equipment and Method: Stylet ?Placement Confirmation: ETT inserted through vocal cords under direct vision, positive ETCO2, CO2 detector and breath sounds checked- equal and bilateral ?Secured at: 23 cm ?Tube secured with: Tape ?Dental Injury: Teeth and Oropharynx as per pre-operative assessment  ? ? ? ? ?

## 2021-06-05 NOTE — Progress Notes (Addendum)
?PROGRESS NOTE ? ? ? ?Jerry Edwards  JKK:[id number] DOB: 21-Sep-1970 DOA: 06/02/2021 ?PCP: [place]  ?Narrative 51/M with history of nonischemic cardiomyopathy, chronic systolic CHF, EF 71%, chronic respiratory failure on as needed home O2, pulmonary hypertension, CKD 3b ,osteogenesis imperfecta with lower leg deformity, wheelchair-bound was admitted with worsening dyspnea, PND orthopnea, blood pressure> 200 on arrival, chest x-ray noted bilateral interstitial infiltrates, BNP >4500 ?-Improving with diuresis, noted to have mild worsening AKI on CKD 4 ? ?Subjective: Feels okay overall, breathing considerably better ? ?Assessment and Plan: ? ?Acute on chronic systolic heart failure ([place]) ?Moderate to severe MR ?Echocardiogram with LV EF 30 to 35%, mild PAH, mod to severe MR ?-Multiple hospitalizations with CHF exacerbation ?-Diuresing on IV Lasix, he is 5.9 L negative ?-Right heart cath yesterday noted mild vol overload, moderate PAH, increased cardiac output ?-Remains on carvedilol, decrease dose of hydralazine and Imdur ?-Plan for TEE today to assess severity of MR ?-Clinically appears close to euvolemic, transition to oral diuretics ?-Discharge planning ? ?Acute on chronic hypoxic respiratory failure ?OSA OHS ?-Required BiPAP on admission, now off ?-Continue diuretics as above and CPAP for OSA ?-Has been on oxygen off-and-on recently ? ?AKI on CKD 3B ?-Baseline creatinine around 2-2.5, now 3.1 ?-Had been on Entresto prior to admission, since discontinued ?-Suspect this is hemodynamically mediated in the setting of acute CHF and MR ?-Right heart cath with preserved cardiac output, will check renal ultrasound ?-He will need close nephrology follow-up and referral ? ?HTN (hypertension) ?Continue carvedilol, hydralazine and isosorbide ? ?Type 2 diabetes mellitus ([place]) ?-CBGs are stable, continue sliding scale insulin ?-Avoid SGLT2i in the setting of AKI/CKD ? ?Osteogenesis imperfecta ?-With  lower extremity deformities, wheelchair-bound at baseline ? 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 [name] in April ? ?Class 3 obesity (HCC) ?Calculated BMI is 42,2 ? ?DVT prophylaxis: Lovenox ?Code Status: Full code ?Family Communication: Discussed patient in detail, no family at bedside ?Disposition Plan: Home later today or tomorrow ? ?Consultants:  ?Heart failure ? ?Procedures:  ? ?Antimicrobials:  ? ? ?Objective: ?Vitals:  ? 06/04/21 2321 06/05/21 0258 06/05/21 0304 06/05/21 6967  ?BP:  117/84  (!) 141/81  ?Pulse: 79 80 77 87  ?Resp: '18 20 16 20  '$ ?Temp:  98.4 ?F (36.9 ?C)  98.4 ?F (36.9 ?C)  ?TempSrc:  Oral  Oral  ?SpO2: 99% 98% 97% 91%  ?Weight:  94.5 kg    ?Height:      ? ? ?Intake/Output Summary (Last 24 hours) at 06/05/2021 1134 ?Last data filed at 06/05/2021 0900 ?Gross per 24 hour  ?Intake 243 ml  ?Output 2250 ml  ?Net -2007 ml  ? ?Filed Weights  ? 06/03/21 0323 06/04/21 0416 06/05/21 0258  ?Weight: 94.8 kg 93.3 kg 94.5 kg  ? ? ?Examination: ? ?General exam: Obese chronically ill male sitting up in bed, AAOx3, no distress ?HEENT: Neck obese unable to assess JVD, right upper eyelid everted and thickened ?CVS: S1-S2, regular rhythm, systolic murmur ?Lungs: Decreased breath sounds the bases ?Abdomen: Soft, obese, nontender, bowel sounds present ?Extremities: No edema, bilateral lower leg deformity ?Skin: No rashes ?Psychiatry: Judgement and insight appear normal. Mood & affect appropriate.  ? ? ? ?Data Reviewed:  ? ?CBC: ?Recent Labs  ?Lab 06/02/21 ?1223 06/04/21 ?8938 06/04/21 ?0918  ?WBC 4.7  --   --   ?NEUTROABS 3.5  --   --   ?HGB 11.7* 13.3  13.3 12.9*  ?HCT 39.9 39.0  39.0 38.0*  ?MCV 98.3  --   --   ?PLT 177  --   --   ? ?Basic Metabolic Panel: ?Recent Labs  ?Lab 06/02/21 ?1223 06/03/21 ?0716 06/03/21 ?1108 06/04/21 ?0322 06/04/21 ?6629 06/04/21 ?4765 06/05/21 ?0418  ?NA 146* 144  --   143 146*  146* 146* 143  ?K 3.7 3.6  --  3.6 3.6  3.6 3.7 3.6  ?CL 111 109  --  106  --   --  107  ?CO2 25 26  --  23  --   --  26  ?GLUCOSE 118* 107*  --  84  --   --  85  ?BUN 32* 40*  --  46*  --   --  51*  ?CREATININE 3.15* 3.33*  --  3.16*  --   --  3.14*  ?CALCIUM 8.6* 8.7*  --  8.3*  --   --  8.5*  ?MG 2.0  --  2.0 2.0  --   --   --   ? ?GFR: ?Estimated Creatinine Clearance: 26.1 mL/min (A) (by C-G formula based on SCr of 3.14 mg/dL (H)). ?Liver Function Tests: ?Recent Labs  ?Lab 06/02/21 ?1223  ?AST 30  ?ALT 23  ?ALKPHOS 54  ?BILITOT 0.7  ?PROT 6.7  ?ALBUMIN 2.8*  ? ?No results for input(s): LIPASE, AMYLASE in the last 168 hours. ?No results for input(s): AMMONIA in the last 168 hours. ?Coagulation Profile: ?No results for input(s): INR, PROTIME in the last 168 hours. ?Cardiac Enzymes: ?No results for input(s): CKTOTAL, CKMB, CKMBINDEX, TROPONINI in the last 168 hours. ?BNP (last 3 results) ?No results for input(s): PROBNP in the last 8760 hours. ?HbA1C: ?Recent Labs  ?  06/02/21 ?1527  ?HGBA1C 4.8  ? ?CBG: ?Recent Labs  ?Lab 06/04/21 ?1122 06/04/21 ?1547 06/04/21 ?2130 06/05/21 ?4650 06/05/21 ?1117  ?GLUCAP 119* 86 119* 87 79  ? ?Lipid Profile: ?No results for input(s): CHOL, HDL, LDLCALC, TRIG, CHOLHDL, LDLDIRECT in the last 72 hours. ?Thyroid Function Tests: ?No results for input(s): TSH, T4TOTAL, FREET4, T3FREE, THYROIDAB in the last 72 hours. ?Anemia Panel: ?No results for input(s): VITAMINB12, FOLATE, FERRITIN, TIBC, IRON, RETICCTPCT in the last 72 hours. ?Urine analysis: ?   ?Component Value Date/Time  ? [place] YELLOW 05/26/2011 2259  ? APPEARANCEUR CLEAR 05/26/2011 2259  ? LABSPEC 1.028 05/26/2011 2259  ? PHURINE 6.5 05/26/2011 2259  ? GLUCOSEU NEGATIVE 05/26/2011 2259  ? [place] NEGATIVE 05/26/2011 2259  ? [place] NEGATIVE 05/26/2011 2259  ? [place] NEGATIVE 05/26/2011 2259  ? PROTEINUR 30 (A) 05/26/2011 2259  ? UROBILINOGEN 1.0 05/26/2011 2259  ? NITRITE NEGATIVE 05/26/2011 2259  ?  LEUKOCYTESUR NEGATIVE 05/26/2011 2259  ? ?Sepsis Labs: ?'@LABRCNTIP'$ (procalcitonin:4,lacticidven:4) ? ?) ?Recent Results (from the past 240 hour(s))  ?Resp Panel by RT-PCR (Flu A&B, Covid) Nasopharyngeal Swab     Status: None  ? Collection Time: 06/02/21 12:25 PM  ? Specimen: Nasopharyngeal Swab; Nasopharyngeal(NP) swabs in vial transport medium  ?Result Value Ref Range Status  ? SARS Coronavirus 2 by RT PCR NEGATIVE NEGATIVE Final  ?  Comment: (NOTE) ?SARS-CoV-2 target nucleic acids are NOT DETECTED. ? ?The SARS-CoV-2 RNA is generally detectable in upper respiratory ?specimens during the acute phase of infection. The lowest ?concentration of SARS-CoV-2 viral copies this assay can detect is ?138 copies/mL. A negative result does not preclude SARS-Cov-2 ?infection and should not be used as the sole basis for treatment or ?other patient management decisions. A negative result  may occur with  ?improper specimen collection/handling, submission of specimen other ?than nasopharyngeal swab, presence of viral mutation(s) within the ?areas targeted by this assay, and inadequate number of viral ?copies(<138 copies/mL). A negative result must be combined with ?clinical observations, patient history, and epidemiological ?information. The expected result is Negative. ? ?Fact Sheet for Patients:  ?EntrepreneurPulse.com.au ? ?Fact Sheet for Healthcare Providers:  ?IncredibleEmployment.be ? ?This test is no t yet approved or cleared by the [place] FDA and  ?has been authorized for detection and/or diagnosis of SARS-CoV-2 by ?FDA under an Emergency Use Authorization (EUA). This EUA will remain  ?in effect (meaning this test can be used) for the duration of the ?COVID-19 declaration under Section 564(b)(1) of the Act, 21 ?U.S.C.section 360bbb-3(b)(1), unless the authorization is terminated  ?or revoked sooner.  ? ? ?  ? Influenza A by PCR NEGATIVE NEGATIVE Final  ? Influenza B by PCR NEGATIVE  NEGATIVE Final  ?  Comment: (NOTE) ?The Xpert Xpress SARS-CoV-2/FLU/RSV plus assay is intended as an aid ?in the diagnosis of influenza from Nasopharyngeal swab specimens and ?should not be used as a sole basis for tre

## 2021-06-05 NOTE — H&P (View-Only) (Signed)
? ? Advanced Heart Failure Rounding Note ? ?PCP-Cardiologist: None  ? ?Subjective:   ? ?3/15 RHC with mildly elevated filling pressures. RA 9, PCWP 21, PVR 2.6, CO 7.4, and CI 4.  ? ?SCr 3.14, stable.  ? ?Echo: LVEF 30-35% (stable). Severe MR (new)  ? ? ?Denies SOB  ? ? ?Objective:   ?Weight Range: ?94.5 kg ?Body mass index is 42.08 kg/m?.  ? ?Vital Signs:   ?Temp:  [98 ?F (36.7 ?C)-98.5 ?F (36.9 ?C)] 98.4 ?F (36.9 ?C) (03/16 0258) ?Pulse Rate:  [58-86] 77 (03/16 0304) ?Resp:  [16-22] 16 (03/16 0304) ?BP: (113-155)/(67-93) 117/84 (03/16 0258) ?SpO2:  [92 %-100 %] 97 % (03/16 0304) ?Weight:  [94.5 kg] 94.5 kg (03/16 0258) ?Last BM Date : 06/03/21 ? ?Weight change: ?Filed Weights  ? 06/03/21 0323 06/04/21 0416 06/05/21 0258  ?Weight: 94.8 kg 93.3 kg 94.5 kg  ? ? ?Intake/Output:  ? ?Intake/Output Summary (Last 24 hours) at 06/05/2021 0847 ?Last data filed at 06/05/2021 [phone] ?Gross per 24 hour  ?Intake 361 ml  ?Output 2350 ml  ?Net -1989 ml  ?  ? ? ?Physical Exam  ?  ?General:  No resp difficulty ?HEENT: normal ?Neck: supple. JVP difficult to assess.  Carotids 2+ bilat; no bruits. No lymphadenopathy or thryomegaly appreciated. ?Cor: PMI nondisplaced. Regular rate & rhythm. No rubs, gallops. 3/6 MR  ?Lungs: clear ?Abdomen: soft, nontender, distended. No hepatosplenomegaly. No bruits or masses. Good bowel sounds. ?Extremities: no cyanosis, clubbing, rash, edema ?Neuro: alert & orientedx3, cranial nerves grossly intact. moves all 4 extremities w/o difficulty. Affect pleasant ? ? ? ?Telemetry  ? ?SB-SB 50-80s  ? ?EKG  ?  ?N/A  ? ?Labs  ?  ?CBC ?Recent Labs  ?  06/02/21 ?1223 06/04/21 ?6606 06/04/21 ?0918  ?WBC 4.7  --   --   ?NEUTROABS 3.5  --   --   ?HGB 11.7* 13.3  13.3 12.9*  ?HCT 39.9 39.0  39.0 38.0*  ?MCV 98.3  --   --   ?PLT 177  --   --   ? ?Basic Metabolic Panel ?Recent Labs  ?  06/03/21 ?1108 06/04/21 ?0322 06/04/21 ?3016 06/04/21 ?0109 06/05/21 ?0418  ?NA  --  143   < > 146* 143  ?K  --  3.6   < > 3.7 3.6  ?CL   --  106  --   --  107  ?CO2  --  23  --   --  26  ?GLUCOSE  --  84  --   --  85  ?BUN  --  46*  --   --  51*  ?CREATININE  --  3.16*  --   --  3.14*  ?CALCIUM  --  8.3*  --   --  8.5*  ?MG 2.0 2.0  --   --   --   ? < > = values in this interval not displayed.  ? ?Liver Function Tests ?Recent Labs  ?  06/02/21 ?1223  ?AST 30  ?ALT 23  ?ALKPHOS 54  ?BILITOT 0.7  ?PROT 6.7  ?ALBUMIN 2.8*  ? ?No results for input(s): LIPASE, AMYLASE in the last 72 hours. ?Cardiac Enzymes ?No results for input(s): CKTOTAL, CKMB, CKMBINDEX, TROPONINI in the last 72 hours. ? ?BNP: ?BNP (last 3 results) ?Recent Labs  ?  02/08/21 ?2110 05/21/21 ?1153 06/02/21 ?1223  ?BNP 585.1* 1,681.3* >4,500.0*  ? ? ?ProBNP (last 3 results) ?No results for input(s): PROBNP in the last 8760 hours. ? ? ?  D-Dimer ?No results for input(s): DDIMER in the last 72 hours. ?Hemoglobin A1C ?Recent Labs  ?  06/02/21 ?1527  ?HGBA1C 4.8  ? ?Fasting Lipid Panel ?No results for input(s): CHOL, HDL, LDLCALC, TRIG, CHOLHDL, LDLDIRECT in the last 72 hours. ?Thyroid Function Tests ?No results for input(s): TSH, T4TOTAL, T3FREE, THYROIDAB in the last 72 hours. ? ?Invalid input(s): FREET3 ? ?Other results: ? ? ?Imaging  ? ? ?CARDIAC CATHETERIZATION ? ?Result Date: 06/04/2021 ?Findings: RA = 9 RV = 58/14 PA = 61/30 (40) PCW = 21 (no significant v-waves) Fick cardiac output/index = 7.4/4.0 PVR = 2.6 WU FA sat = 92% PA sat = 69%, 72% SVC sat = 74% Assessment: 1. Mild volume overload 2. Moderate mixed pulmonary HTN 3. No evidence of significant v-waves in PCWP tracing 4. High cardiac output Plan/Discussion: Continue IV diuresis. Plan TEE tomorrow to look more closely at MV. Glori Bickers, MD 9:30 AM  ? ? ?Medications:   ? ? ?Scheduled Medications: ? aspirin EC  81 mg Oral Daily  ? carvedilol  6.25 mg Oral BID WC  ? doxazosin  1 mg Oral QHS  ? erythromycin  1 application. Right Eye QHS  ? furosemide  80 mg Intravenous BID  ? hydrALAZINE  50 mg Oral TID  ? insulin aspart  0-9 Units  Subcutaneous TID WC  ? isosorbide mononitrate  30 mg Oral Daily  ? olopatadine  1 drop Both Eyes BID  ? potassium chloride  40 mEq Oral Once  ? sodium chloride flush  3 mL Intravenous Q12H  ? sodium chloride flush  3 mL Intravenous Q12H  ? sodium chloride flush  3 mL Intravenous Q12H  ? ? ?Infusions: ? sodium chloride    ? sodium chloride    ? ? ?PRN Medications: ?sodium chloride, sodium chloride, acetaminophen, ondansetron (ZOFRAN) IV, sodium chloride flush, sodium chloride flush ? ? ?Assessment/Plan  ? ?1. A/C HFrEF  ?- unable to fit in cMRI machine due to body stature.  PYP scan 8/18 no amyloid ?- Echo 09/2017: EF 25-30%, grade 2 DD ?- Echo 10/19: EF  30-35% ?- Echo 5/21 EF 35% ?- Echo 11/22 35-40% ?- Echo this admit EF 30-35%.  ?-Recently started Jardiance and lasix decreased. Second visit to ED in the last month with CHF. ?- RHC with mildly elevated filling pressures.  ?- Volume status improving. Continue IV lasix today. Consider po tomorrow.   ?- Continue coreg 6.25 mg BID ?- Hold Entresto, spiro and Jardiance w/ elevated SCr  ?- Continue hydralazine 100 mg TID. ?- Continue Imdur 60 mg ?_ hold off on SGLT2i ?  ?2. Acute on Chronic respiratory failure ?- In setting of acute on chronic CHF. Off BiPAP, O2 now stable on 2L [place] ?- Has known OSA on CPAP.  ?  ?2. Osteogenesis imperfecta  ?- Wheel chair bound; limited mobility.  ?- No change  ?  ?3. Hypertensive urgency ?- off NTG drip ?- SBPs now 130s-140s ?- Continue GDMT titration per above  ?- May need to add amlodipine.  ?  ?4. OSA  ?- Wears O2 at night.  ?- Had PSG on 11/07/17 with AHI 88/hr c/w very severe OSA.  ?- Follows with Dr. Radford Pax, reports CPAP compliance. ?- Using CPAP when napping.  ?  ?5. Morbid Obesity  ?-Body mass index is 42.08 kg/m?. ?  ?6. Pulmonary hypertension ?- This appears to be primarily pulmonary venous hypertension on previous RHC.  There may be a component of PAH related to OSA.   ?-  No change.  ?  ?7. AKI on CKD ?-Scr 2.1>>3 over last 4  months ?-Creatinine 3.14 today , stable for the few days.  ? ?8.MR, Severe  ?- TEE today to further assess.  ? ? ?Length of Stay: 2 ? ?Darrick Grinder, NP  ?06/05/2021, 8:47 AM ? ?Advanced Heart Failure Team ?Pager [phone] (M-F; 7a - 5p)  ?Please contact [facility] for night-coverage after hours (5p -7a ) and weekends on [email or web address] ? ? ?Patient seen and examined with the above-signed Advanced Practice Provider and/or Housestaff. I personally reviewed laboratory data, imaging studies and relevant notes. I independently examined the patient and formulated the important aspects of the plan. I have edited the note to reflect any of my changes or salient points. I have personally discussed the plan with the patient and/or family. ? ?[place] numbers reviewed with him.  ? ?Feels ok. Continue to diurese. Denies CP or SOB.  Renal function stable.  ? ?General:  Sitting up in bed No resp difficulty ?HEENT: normal ?Neck: supple. Hard to see jvp  Carotids 2+ bilat; no bruits. No lymphadenopathy or thryomegaly appreciated. ?Cor: Barrel chested Regular rate & rhythm. 3/6 MR. ?Lungs: clear ?Abdomen: obese soft, nontender, nondistended. No hepatosplenomegaly. No bruits or masses. Good bowel sounds. ?Extremities: no cyanosis, clubbing, rash, edema ?Neuro: alert & orientedx3, cranial nerves grossly intact. moves all 4 extremities w/o difficulty. Affect pleasant ? ?Plan TEE today to further evaluate MR. He understands that he may need to be intubated for the procedure for safety. Will await anesthesia eval.  ? ?Glori Bickers, MD  ?9:24 AM ? ? ? ?

## 2021-06-05 NOTE — TOC Initial Note (Signed)
Transition of Care (TOC) - Initial/Assessment Note  ? ? ?Patient Details  ?Name: Jerry Edwards ?MRN: [id number] ?Date of Birth: [birth year] ? ?Transition of Care [facility]) CM/SW Contact:    ?Marcheta Grammes Rexene Alberts, RN ?Phone Number: [phone] ?06/05/2021, 5:03 PM ? ?Clinical Narrative:                 ? ?HF TOC CM spoke to pt and mother at bedside. States his mother provides transportation. Pt reports he has need DME at home. Will continue to follow for dc needs.  ? ? ? ?Expected Discharge Plan: Home/Self Care ?Barriers to Discharge: Continued Medical Work up ? ? ?Patient Goals and CMS Choice ?Patient states their goals for this hospitalization and ongoing recovery are:: wants to remain independent ?CMS Medicare.gov Compare Post Acute Care list provided to:: Patient ?  ? ?Expected Discharge Plan and Services ?Expected Discharge Plan: Home/Self Care ?  ?Discharge Planning Services: CM Consult ?  ?Living arrangements for the past 2 months: [place] ?                ?  ?  ?  ?  ?  ?  ?  ?  ?  ?  ? ?Prior Living Arrangements/Services ?Living arrangements for the past 2 months: [place] ?Lives with:: Parents ?Patient language and need for interpreter reviewed:: Yes ?Do you feel safe going back to the place where you live?: Yes      ?Need for Family Participation in Patient Care: No (Comment) ?Care giver support system in place?: No (comment) ?Current home services: DME (oxygen, CPAP, wheelchair) ?Criminal Activity/Legal Involvement Pertinent to Current Situation/Hospitalization: No - Comment as needed ? ?Activities of Daily Living ?Home Assistive Devices/Equipment: Eyeglasses, Wheelchair, Oxygen ?ADL Screening (condition at time of admission) ?Patient's cognitive ability adequate to safely complete daily activities?: Yes ?Is the patient deaf or have difficulty hearing?: No ?Does the patient have difficulty seeing, even when wearing glasses/contacts?: Yes ?Does the patient have difficulty concentrating,  remembering, or making decisions?: No ?Patient able to express need for assistance with ADLs?: Yes ?Does the patient have difficulty dressing or bathing?: No ?Independently performs ADLs?: Yes (appropriate for developmental age) ?Does the patient have difficulty walking or climbing stairs?: Yes ?Weakness of Legs: Both ?Weakness of Arms/Hands: None ? ?Permission Sought/Granted ?Permission sought to share information with : Case Manager, Family Supports, PCP ?Permission granted to share information with : Yes, Verbal Permission Granted ? Share Information with NAME: Isaac Bliss ?   ? Permission granted to share info w Relationship: mother ? Permission granted to share info w Contact Information: [phone] ? ?Emotional Assessment ?Appearance:: Appears stated age ?Attitude/Demeanor/Rapport: Engaged ?Affect (typically observed): Accepting ?Orientation: : Oriented to Self, Oriented to Place, Oriented to  Time, Oriented to Situation ?  ?Psych Involvement: No (comment) ? ?Admission diagnosis:  CHF (congestive heart failure) ([place]) [I50.9] ?Heart failure ([place]) [I50.9] ?Patient Active Problem List  ? Diagnosis Date Noted  ? HTN (hypertension) 06/03/2021  ? Acute kidney injury superimposed on chronic kidney disease ([place]) 06/03/2021  ? Type 2 diabetes mellitus ([place]) 06/03/2021  ? 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 Class 3 obesity ([place]) 06/03/2021  ? Encounter for psychological evaluation 03/08/2021  ? Chest pain 02/08/2021  ? 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 Nutritional counseling 04/27/2018  ? Essential hypertension 09/20/2017  ? EKG abnormalities   ? Atypical chest pain 10/12/2016  ? Morbid obesity ([place]) 06/08/2016  ? Acute on chronic respiratory failure with hypoxia ([place]) 03/05/2016  ?  Obesity hypoventilation syndrome ([place]) 02/29/2016  ? Obstructive sleep apnea 02/29/2016  ? Respiratory acidosis   ? NICM (nonischemic cardiomyopathy) ([place]) 02/22/2016  ? Chronic systolic heart failure ([place]) 05/31/2015  ? Hypertensive  heart disease 05/01/2015  ? Elevated troponin 03/28/2015  ? Acute on chronic systolic heart failure ([place]) 04/08/2013  ? ?PCP:  [place] ?Pharmacy:   ?[place], [place] ?[place] [address] ?Phone: [phone] Fax: [phone] ? ?[place], [place] - [address] ?[place] ?[place] [address] ?Phone: [phone] Fax: [phone] ? ?[facility] - [address], [place] ?[address] ?[place] [address] ?Phone: [phone] Fax: [phone] ? ? ? ? ?Social Determinants of Health (SDOH) Interventions ?  ? ?Readmission Risk Interventions ?No flowsheet data found. ? ? ?

## 2021-06-05 NOTE — Transfer of Care (Signed)
Immediate Anesthesia Transfer of Care Note ? ?Patient: Jerry Edwards ? ?Procedure(s) Performed: TRANSESOPHAGEAL ECHOCARDIOGRAM (TEE) ? ?Patient Location: Endoscopy Unit ? ?Anesthesia Type:General ? ?Level of Consciousness: awake and alert  ? ?Airway & Oxygen Therapy: Patient Spontanous Breathing and Patient connected to nasal cannula oxygen ? ?Post-op Assessment: Report given to RN and Post -op Vital signs reviewed and stable ? ?Post vital signs: Reviewed and stable ? ?Last Vitals:  ?Vitals Value Taken Time  ?BP    ?Temp    ?Pulse 80 06/05/21 1258  ?Resp 18 06/05/21 1258  ?SpO2 94 % 06/05/21 1258  ?Vitals shown include unvalidated device data. ? ?Last Pain:  ?Vitals:  ? 06/05/21 1143  ?TempSrc: Oral  ?PainSc: 0-No pain  ?   ? ?  ? ?Complications: No notable events documented. ?

## 2021-06-06 ENCOUNTER — Encounter (HOSPITAL_COMMUNITY): Payer: Self-pay | Admitting: Internal Medicine

## 2021-06-06 LAB — GLUCOSE, CAPILLARY
Glucose-Capillary: 84 mg/dL (ref 70–99)
Glucose-Capillary: 98 mg/dL (ref 70–99)
Glucose-Capillary: 100 mg/dL — ABNORMAL HIGH (ref 70–99)

## 2021-06-06 LAB — BASIC METABOLIC PANEL
Anion gap: 13 (ref 5–15)
BUN: 47 mg/dL — ABNORMAL HIGH (ref 6–20)
CO2: 26 mmol/L (ref 22–32)
Calcium: 8.3 mg/dL — ABNORMAL LOW (ref 8.9–10.3)
Chloride: 103 mmol/L (ref 98–111)
Creatinine, Ser: 2.96 mg/dL — ABNORMAL HIGH (ref 0.61–1.24)
GFR, Estimated: 25 mL/min — ABNORMAL LOW (ref >60)
Glucose, Bld: 77 mg/dL (ref 70–99)
Potassium: 3.5 mmol/L (ref 3.5–5.1)
Sodium: 142 mmol/L (ref 135–145)

## 2021-06-06 LAB — MAGNESIUM: Magnesium: 2 mg/dL (ref 1.7–2.4)

## 2021-06-06 MED ORDER — POTASSIUM CHLORIDE CRYS ER 20 MEQ PO TBCR
40.0000 meq | EXTENDED_RELEASE_TABLET | ORAL | Status: AC
  Administered 2021-06-06 (×2): 40 meq via ORAL
  Filled 2021-06-06 (×2): qty 2

## 2021-06-06 MED ORDER — CARVEDILOL 6.25 MG PO TABS: 6.2500 mg | ORAL_TABLET | Freq: Two times a day (BID) | ORAL | 6 refills | Status: DC

## 2021-06-06 MED ORDER — EMPAGLIFLOZIN 10 MG PO TABS
10.0000 mg | ORAL_TABLET | Freq: Every day | ORAL | Status: DC
  Administered 2021-06-06: 10 mg via ORAL
  Filled 2021-06-06: qty 1

## 2021-06-06 MED ORDER — HYDRALAZINE HCL 100 MG PO TABS: 50.0000 mg | ORAL_TABLET | Freq: Three times a day (TID) | ORAL | 3 refills | Status: DC

## 2021-06-06 MED ORDER — FUROSEMIDE 80 MG PO TABS: ORAL_TABLET | ORAL | 6 refills | Status: DC

## 2021-06-06 MED ORDER — FUROSEMIDE 40 MG PO TABS: 80.0000 mg | ORAL_TABLET | Freq: Two times a day (BID) | ORAL | Status: DC

## 2021-06-06 NOTE — Progress Notes (Signed)
Inpatient Diabetes Program Recommendations ? ?AACE/ADA: New Consensus Statement on Inpatient Glycemic Control (2015) ? ?Target Ranges:  Prepandial:   less than 140 mg/dL ?     Peak postprandial:   less than 180 mg/dL (1-2 hours) ?     Critically ill patients:  140 - 180 mg/dL  ? ?Lab Results  ?Component Value Date  ? GLUCAP 84 06/06/2021  ? HGBA1C 4.8 06/02/2021  ? ? Latest Reference Range & Units 06/04/21 03:22 06/05/21 04:18 06/06/21 03:58  ?GFR, Estimated >60 mL/min 23 (L) 23 (L) 25 (L)  ?(L): Data is abnormally low ?Review of Glycemic Control ? ?Diabetes history: type 2? ?Outpatient Diabetes medications: Jardiance 10 mg daily ?Current orders for Inpatient glycemic control: Novolog SENSITIVE correction scale TID ? ?Inpatient Diabetes Program Recommendations:   ?Noted that patient's GFR is 25 today. Vania Rea is not recommended if GFR is less than 30. Noted that A1C is 4.8%. CBGs have been less than 100 mg/dl in the hospital..  ? ?Will continue to monitor blood sugars while in the hospital. ? ?Harvel Ricks RN BSN CDE ?Diabetes Coordinator ?Pager: [phone]  8am-5pm  ? ? ?

## 2021-06-06 NOTE — Progress Notes (Addendum)
? ? Advanced Heart Failure Rounding Note ? ?PCP-Cardiologist: None  ? ?Subjective:   ? ?3/15 RHC with mildly elevated filling pressures. RA 9, PCWP 21, PVR 2.6, CO 7.4, and CI 4.  ?3/16 TEE- Mild P2 restriction with 2-3+ (mod-severe central and posterior MR). ? ?Yesterday diuresed with IV lasix. Negative 1.6 liters . Weight down 8 pounds.  ? ?SCr 2.96.  ? ? ?Feels ok. Worried about getting fluid again.  ? ? ?Objective:   ?Weight Range: ?91.1 kg ?Body mass index is 40.56 kg/m?.  ? ?Vital Signs:   ?Temp:  [98 ?F (36.7 ?C)-98.4 ?F (36.9 ?C)] 98 ?F (36.7 ?C) (03/17 4098) ?Pulse Rate:  [64-88] 80 (03/17 0318) ?Resp:  [13-24] 20 (03/17 0318) ?BP: (111-136)/(66-95) 117/85 (03/17 0318) ?SpO2:  [91 %-96 %] 92 % (03/17 0318) ?Weight:  [91.1 kg] 91.1 kg (03/17 0318) ?Last BM Date : 06/05/21 ? ?Weight change: ?Filed Weights  ? 06/04/21 0416 06/05/21 0258 06/06/21 0318  ?Weight: 93.3 kg 94.5 kg 91.1 kg  ? ? ?Intake/Output:  ? ?Intake/Output Summary (Last 24 hours) at 06/06/2021 1008 ?Last data filed at 06/06/2021 0854 ?Gross per 24 hour  ?Intake 1077 ml  ?Output 2450 ml  ?Net -1373 ml  ?  ? ? ?Physical Exam  ? General:   No resp difficulty ?HEENT: normal ?Neck: supple. no JVD. Carotids 2+ bilat; no bruits. No lymphadenopathy or thryomegaly appreciated. ?Cor: PMI nondisplaced. Regular rate & rhythm. No rubs, gallops. 2/6 MR . ?Lungs: clear on 2 liters [place].  ?Abdomen: soft, nontender, nondistended. No hepatosplenomegaly. No bruits or masses. Good bowel sounds. ?Extremities: no cyanosis, clubbing, rash, edema ?Neuro: alert & orientedx3, cranial nerves grossly intact. moves all 4 extremities w/o difficulty. Affect pleasant ? ? ? ?Telemetry  ? ?SB-SB 50-80s  ? ?EKG  ?  ?N/A  ? ?Labs  ?  ?CBC ?Recent Labs  ?  06/04/21 ?1191 06/04/21 ?0918  ?HGB 13.3  13.3 12.9*  ?HCT 39.0  39.0 38.0*  ? ?Basic Metabolic Panel ?Recent Labs  ?  06/04/21 ?0322 06/04/21 ?4782 06/05/21 ?9562 06/06/21 ?0358  ?NA 143   < > 143 142  ?K 3.6   < > 3.6 3.5  ?CL  106  --  107 103  ?CO2 23  --  26 26  ?GLUCOSE 84  --  85 77  ?BUN 46*  --  51* 47*  ?CREATININE 3.16*  --  3.14* 2.96*  ?CALCIUM 8.3*  --  8.5* 8.3*  ?MG 2.0  --   --  2.0  ? < > = values in this interval not displayed.  ? ?Liver Function Tests ?No results for input(s): AST, ALT, ALKPHOS, BILITOT, PROT, ALBUMIN in the last 72 hours. ? ?No results for input(s): LIPASE, AMYLASE in the last 72 hours. ?Cardiac Enzymes ?No results for input(s): CKTOTAL, CKMB, CKMBINDEX, TROPONINI in the last 72 hours. ? ?BNP: ?BNP (last 3 results) ?Recent Labs  ?  02/08/21 ?2110 05/21/21 ?1153 06/02/21 ?1223  ?BNP 585.1* 1,681.3* >4,500.0*  ? ? ?ProBNP (last 3 results) ?No results for input(s): PROBNP in the last 8760 hours. ? ? ?D-Dimer ?No results for input(s): DDIMER in the last 72 hours. ?Hemoglobin A1C ?No results for input(s): HGBA1C in the last 72 hours. ? ?Fasting Lipid Panel ?No results for input(s): CHOL, HDL, LDLCALC, TRIG, CHOLHDL, LDLDIRECT in the last 72 hours. ?Thyroid Function Tests ?No results for input(s): TSH, T4TOTAL, T3FREE, THYROIDAB in the last 72 hours. ? ?Invalid input(s): FREET3 ? ?Other results: ? ? ?Imaging  ? ? ?  US RENAL ? ?Result Date: 06/05/2021 ?CLINICAL DATA:  Renal dysfunction EXAM: RENAL / URINARY TRACT ULTRASOUND COMPLETE COMPARISON:  None. FINDINGS: Right Kidney: Renal measurements: 11.6 x 6.3 x 4.7 cm = volume: 179.7 mL. There is no hydronephrosis. There is marked increase in cortical echogenicity, possibly suggesting medical renal disease or scarring. Left Kidney: Renal measurements: 9.5 x 5.7 x 5.6 cm = volume: 158.2 mL. There is no hydronephrosis. Margins are indistinct. Bladder: Appears normal for degree of bladder distention. Other: According to the note by the technologist, examination was technically limited due to patient's body habitus and overlying bowel gas. IMPRESSION: There is no hydronephrosis. There is increased cortical echogenicity in the right kidney. Electronically Signed   By:  Elmer Picker M.D.   On: 06/05/2021 15:44   ? ? ?Medications:   ? ? ?Scheduled Medications: ? aspirin EC  81 mg Oral Daily  ? carvedilol  6.25 mg Oral BID WC  ? doxazosin  1 mg Oral QHS  ? erythromycin  1 application. Right Eye QHS  ? furosemide  80 mg Intravenous BID  ? hydrALAZINE  50 mg Oral TID  ? insulin aspart  0-9 Units Subcutaneous TID WC  ? isosorbide mononitrate  30 mg Oral Daily  ? olopatadine  1 drop Both Eyes BID  ? potassium chloride  40 mEq Oral Q4H  ? sodium chloride flush  3 mL Intravenous Q12H  ? sodium chloride flush  3 mL Intravenous Q12H  ? sodium chloride flush  3 mL Intravenous Q12H  ? ? ?Infusions: ? sodium chloride    ? sodium chloride    ? ? ?PRN Medications: ?sodium chloride, sodium chloride, acetaminophen, ondansetron (ZOFRAN) IV, phenol, sodium chloride flush, sodium chloride flush ? ? ?Assessment/Plan  ? ?1. A/C HFrEF  ?- unable to fit in cMRI machine due to body stature.  PYP scan 8/18 no amyloid ?- Echo 09/2017: EF 25-30%, grade 2 DD ?- Echo 10/19: EF  30-35% ?- Echo 5/21 EF 35% ?- Echo 11/22 35-40% ?- Echo this admit EF 30-35%.  ?-Recently started Jardiance and lasix decreased. Second visit to ED in the last month with CHF. ?- RHC with mildly elevated filling pressures.  ?- Volume status stable.  Overall weight down 9 pounds. Stop IV lasix. Start lasix 80 mg po twice a day. We will provide a scale for discharge. May need to add weekly metolazone at his follow up.  ?- Continue coreg 6.25 mg BID.  ?- Keep off  Entresto, spiro and Jardiance w/ elevated SCr  ?- Continue hydralazine 100 mg TID. ?- Continue Imdur 60 mg ?- hold off on SGLT2i ?  ?2. Acute on Chronic respiratory failure ?- In setting of acute on chronic CHF. Off BiPAP, O2 now stable on 2L [place] ?- Has known OSA on CPAP.  ?  ?2. Osteogenesis imperfecta  ?- Wheel chair bound; limited mobility.  ?- No change  ?  ?3. Hypertensive urgency ?- off NTG drip ?- Stable  ?- Continue GDMT titration per above  ?- May need to add  amlodipine.  ?  ?4. OSA  ?- Wears O2 at night.  ?- Had PSG on 11/07/17 with AHI 88/hr c/w very severe OSA.  ?- Follows with Dr. Radford Pax, reports CPAP compliance. ?- Using CPAP when napping.  ?  ?5. Morbid Obesity  ?-Body mass index is 40.56 kg/m?. ?  ?6. Pulmonary hypertension ?- This appears to be primarily pulmonary venous hypertension on previous RHC.  There may be a component of PAH  related to OSA.   ?- No change.  ?  ?7. AKI on CKD ?-Scr 2.1>>3 over last 4 months ?-Creatinine 2.96  today , stable for the few days.  ?- Renal US- no hydronephrosis  ?- He will need to establish with Nephrology  ? 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 ?- Dr Haroldine Laws discussed with Dr Burt Knack.  No indication for clip at this time. Follow if worsens will refer to Structural Team.  ? ?  ?HF meds for d/c ?Carvedilol 6.25 mg twice a day  ?Hydralazine 50 mg tid ?Imdur 30 mg daily  ?Lasix 80 mg twice a day with extra 40 mg as needed for 3 pound weight gain ?Kdur 20 meq daily ?Asa 81 mg daily  ?Cardura 1 mg qhs ?Jardiance 10  ? ?He has f/u in the HF clinic 3/30.  ? ?Length of Stay: 3 ? ?Darrick Grinder, NP  ?06/06/2021, 10:08 AM ? ?Advanced Heart Failure Team ?Pager [phone] (M-F; 7a - 5p)  ?Please contact [facility] for night-coverage after hours (5p -7a ) and weekends on [email or web address] ? ?Patient seen and examined with the above-signed Advanced Practice Provider and/or Housestaff. I personally reviewed laboratory data, imaging studies and relevant notes. I independently examined the patient and formulated the important aspects of the plan. I have edited the note to reflect any of my changes or salient points. I have personally discussed the plan with the patient and/or family. ? ?He feels better. No SOB,orthopnea or PND. Scr improved.  ? ?General:  Well appearing. No resp difficulty ?HEENT: normal left eyelid swollen ?Neck: supple. no JVD. Carotids 2+ bilat; no bruits. No lymphadenopathy  or thryomegaly appreciated. ?Cor: PMI nondisplaced. Regular rate & rhythm. 3/6 MR. ?Lungs: clear ?Abdomen: obese soft, nontender, nondistended. No hepatosplenomegaly. No bruits or masses. Good bowel sounds. ?Ext

## 2021-06-06 NOTE — TOC CM/SW Note (Addendum)
HF TOC CM spoke to pt and he has contacted [place] [facility] for a new water receptor tray for his CPAP. Attending made aware and order placed. Contacted [employer] rep, Freda Munro for follow up on CPAP tray. Pt reports he needs scale for daily weights. HF RN Navigator provided pt with scale. Jonnie Finner RN3 CCM, Heart Failure TOC CM [phone]  ?

## 2021-06-06 NOTE — Plan of Care (Signed)
?  Problem: Clinical Measurements: ?Goal: Diagnostic test results will improve ?Outcome: Progressing ?Goal: Respiratory complications will improve ?Outcome: Progressing ?Goal: Cardiovascular complication will be avoided ?Outcome: Progressing ?  ?Problem: Coping: ?Goal: Level of anxiety will decrease ?Outcome: Progressing ?  ?Problem: Elimination: ?Goal: Will not experience complications related to urinary retention ?Outcome: Progressing ?  ?Problem: Pain Managment: ?Goal: General experience of comfort will improve ?Outcome: Progressing ?  ?Problem: Safety: ?Goal: Ability to remain free from injury will improve ?Outcome: Progressing ?  ?

## 2021-06-06 NOTE — Care Management Important Message (Signed)
Important Message ? ?Patient Details  ?Name: Jerry Edwards ?MRN: [id number] ?Date of Birth: [birth year] ? ? ?Medicare Important Message Given:  Yes ? ? ? ? ?Jerry Edwards ?06/06/2021, 7:48 AM ?

## 2021-06-06 NOTE — Progress Notes (Signed)
Patient discharged home, escorted to the vehicle in his wheelchair with 2l o2 via [place]. Pt's mom was with him, belongings send with patient. Vitals stable at the time of discharge. Went home with family .  ?

## 2021-06-06 NOTE — Discharge Summary (Signed)
Physician Discharge Summary  ?Jerry Edwards UXL:[id number] DOB: [birth year] DOA: 06/02/2021 ? ?PCP: [place] ? ?Admit date: 06/02/2021 ?Discharge date: 06/06/2021 ? ?Time spent: 35 minutes ? ?Recommendations for Outpatient Follow-up:  ?Continue to monitor severity of mitral regurgitation ?Heart failure clinic [name] 3/30, please check BMP at follow-up ?Urgent referral sent to [facility] for CKD3b ? ? ?Discharge Diagnoses:  ?Principal Problem: ?  Acute on chronic systolic heart failure ([place]) ?Severe mitral regurgitation ?  HTN (hypertension) ?  Acute kidney injury superimposed on chronic kidney disease ([place]) ?  Type 2 diabetes mellitus ([place]) ?  Osteogenesis imperfecta ?  Class 3 obesity (HCC) ?CKD3b ? ?Discharge Condition: Stable ? ?Diet recommendation: Low-sodium, diabetic, heart healthy ? ?Filed Weights  ? 06/04/21 0416 06/05/21 0258 06/06/21 0318  ?Weight: 93.3 kg 94.5 kg 91.1 kg  ? ? ?History of present illness:  ?51/M with history of nonischemic cardiomyopathy, chronic systolic CHF, EF 53%, chronic respiratory failure on as needed home O2, pulmonary hypertension, CKD 3b ,osteogenesis imperfecta with lower leg deformity, wheelchair-bound was admitted with worsening dyspnea, PND orthopnea, blood pressure> 200 on arrival, chest x-ray noted bilateral interstitial infiltrates, BNP >4500 ?-Improving with diuresis, noted to have mild worsening AKI on CKD 4 ?  ? ?Hospital Course:  ? ?Acute on chronic systolic heart failure ([place]) ?Moderate to severe MR ?Echocardiogram with LV EF 30 to 35%, mild PAH, mod to severe MR ?-Multiple hospitalizations with CHF exacerbation ?-Diuresed well on IV Lasix he is 7.5 L negative, followed by heart failure team this admission ?-Right heart cath 3/15 noted mild vol overload, moderate PAH, increased cardiac output ?-Remains on carvedilol, decreased dose of hydralazine and Imdur ?-TEE completed yesterday which noted moderate to severe central and  posterior MR, case discussed with valve team, no indication for mitral clip at this time, continue to follow  ?-Transitioned to oral Lasix 80 Mg twice daily ?-Discharged home in a stable condition, needs close follow-up on account of cardiomyopathy, CKD, severe MR, please check BMP at follow-up ?  ?Acute on chronic hypoxic respiratory failure ?OSA OHS ?-Required BiPAP on admission, now off ?-Continue diuretics as above and CPAP for OSA ?-Has been on oxygen off-and-on recently has been on 2 L O2 at baseline ?  ?AKI on CKD 3B ?-Baseline creatinine around 2-2.5, now 3.1 ?-Had been on Entresto prior to admission, since discontinued ?-Suspect this is hemodynamically mediated in the setting of acute CHF and MR ?-Right heart cath with preserved cardiac output, renal ultrasound without hydronephrosis ?-Sent urgent nephrology referral ?  ?HTN (hypertension) ?Continue carvedilol, hydralazine and isosorbide ?  ?Type 2 diabetes mellitus ([place]) ?-CBGs are stable, continue sliding scale insulin ?-Empagliflozin resumed ?  ?Osteogenesis imperfecta ?-With lower extremity deformities, wheelchair-bound at baseline ?  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 [name] in April ?  ?Class 3 obesity (HCC) ?Calculated BMI is 42,2 ? ?  ?Consultants:  ?Heart failure ? ?Procedure ?3/15 RHC with mildly elevated filling pressures. RA 9, PCWP 21, PVR 2.6, CO 7.4, and CI 4.  ?3/16 TEE- Mild P2 restriction with 2-3+ (mod-severe central and posterior MR). ? ?Discharge Exam: ?Vitals:  ? 06/06/21 0318 06/06/21 1046  ?BP: 117/85 114/74  ?Pulse: 80 76  ?Resp: 20 18  ?Temp: 98 ?F (36.7 ?C) 98 ?F (36.7 ?C)  ?SpO2: 92% 95%  ? ? ?General: AAOx3 ?Cardiovascular: S1-S2, regular rate rhythm ?Respiratory: Clear ? ?Discharge Instructions ? ? ?  Discharge Instructions   ? ? Ambulatory referral to Nephrology   Complete by: As directed ?  ? CKD 4-with CHF,  needs urgent Follow up  ? Diet - low sodium heart healthy   Complete by: As directed ?  ? Increase activity slowly   Complete by: As directed ?  ? ?  ? ?Allergies as of 06/06/2021   ? ?   Reactions  ? Fish Allergy Anaphylaxis  ? Other Anaphylaxis, Other (See Comments)  ? NO NUTS!!!!!! Peanut are legumes!!  ?"No red meat- Does not eat"  ? Peanuts [peanut Oil] Anaphylaxis  ? Shellfish Allergy Anaphylaxis  ? Latex Itching  ? Pork-derived Products Other (See Comments)  ? Does not eat- religious reasons  ? ?  ? ?  ?Medication List  ?  ? ?STOP taking these medications   ? ?Entresto 97-103 MG ?Generic drug: sacubitril-valsartan ?  ?naproxen sodium 220 MG tablet ?Commonly known as: ALEVE ?  ? ?  ? ?TAKE these medications   ? ?aspirin EC 81 MG tablet ?Take 1 tablet (81 mg total) by mouth daily. ?  ?carvedilol 6.25 MG tablet ?Commonly known as: COREG ?Take 1 tablet (6.25 mg total) by mouth 2 (two) times daily with a meal. ?What changed:  ?medication strength ?how much to take ?  ?doxazosin 1 MG tablet ?Commonly known as: CARDURA ?TAKE 1 TABLET BY MOUTH AT BEDTIME ?  ?empagliflozin 10 MG Tabs tablet ?Commonly known as: Jardiance ?Take 1 tablet (10 mg total) by mouth daily before breakfast. ?  ?erythromycin ophthalmic ointment ?Place 1 application into the right eye at bedtime. ?  ?furosemide 80 MG tablet ?Commonly known as: LASIX ?Take 80 mg twice a day. Can take an extra 40 mg for 3 pound weight gain ?What changed:  ?medication strength ?how much to take ?how to take this ?when to take this ?additional instructions ?  ?hydrALAZINE 100 MG tablet ?Commonly known as: APRESOLINE ?Take 0.5 tablets (50 mg total) by mouth 3 (three) times daily. ?What changed: how much to take ?  ?isosorbide mononitrate 30 MG 24 hr tablet ?Commonly known as: IMDUR ?Take 1 tablet (30 mg total) by mouth daily. ?  ?nitroGLYCERIN 0.4 MG SL tablet ?Commonly known as: NITROSTAT ?Place 1 tablet (0.4 mg total) under the tongue every 5 (five) minutes x 3 doses  as needed for chest pain. ?  ?OXYGEN ?Inhale 2 L into the lungs as needed (shortness of breath). ?  ?PATADAY OP ?Place 1 drop into both eyes in the morning and at bedtime. ?  ?potassium chloride SA 20 MEQ tablet ?Commonly known as: KLOR-CON M ?Take 1 tablet (20 mEq total) by mouth daily. ?  ? ?  ? ?  ?  ? ? ?  ?Durable Medical Equipment  ?(From admission, onward)  ?  ? ? ?  ? ?  Start     Ordered  ? 06/06/21 1058  For home use only DME Other see comment  Once       ?Comments: CPAP water receptor tray, needs replacing. RT to evaluate CPAP device.  ?Question:  Length of Need  Answer:  Lifetime  ? 06/06/21 1058  ? ?  ?  ? ?  ? ?Allergies  ?Allergen Reactions  ? Fish Allergy Anaphylaxis  ? Other Anaphylaxis and Other (See Comments)  ?  NO NUTS!!!!!! Peanut are legumes!!  ? ?"No red meat- Does not eat"  ? Peanuts [Peanut Oil] Anaphylaxis  ? Shellfish Allergy Anaphylaxis  ? Latex Itching  ?  Pork-Derived Products Other (See Comments)  ?  Does not eat- religious reasons  ? ? Follow-up Information   ? ? Bensimhon, Shaune Pascal, MD. Daphane Shepherd on 06/19/2021.   ?Specialty: Cardiology ?Why: 3:30 PM, [facility], Entrance C ?Contact information: ?[address] ?[address] ?[place] [address] ?[phone] ? ? ?  ?  ? ? Wenda Low, MD Follow up.   ?Specialty: Internal Medicine ?Why: Please follow up in a week. ?Contact information: ?[address] ?[address] ?[place] [address] ?[phone] ? ? ?  ?  ? ?  ?  ? ?  ? ? ? ?The results of significant diagnostics from this hospitalization (including imaging, microbiology, ancillary and laboratory) are listed below for reference.   ? ?Significant Diagnostic Studies: ?DG Chest 2 View ? ?Result Date: 05/10/2021 ?CLINICAL DATA:  Chest pain and shortness of breath EXAM: CHEST - 2 VIEW COMPARISON:  02/08/2021 FINDINGS: Cardiac shadow is enlarged but accentuated by the frontal technique. Mild vascular congestion is seen with mild interstitial edema. No focal infiltrate or  effusion is noted. No bony abnormality is seen. IMPRESSION: Mild CHF. Electronically Signed   By: Inez Catalina M.D.   On: 05/10/2021 21:46  ? ?CARDIAC CATHETERIZATION ? ?Result Date: 06/04/2021 ?Findin

## 2021-06-06 NOTE — Progress Notes (Signed)
Discharge instructions given. Patient verbalized understanding and all questions were answered. Patient's mother will be here later this evening to take him home.  ?

## 2021-06-11 NOTE — Anesthesia Postprocedure Evaluation (Signed)
Anesthesia Post Note ? ?Patient: Jerry Edwards ? ?Procedure(s) Performed: TRANSESOPHAGEAL ECHOCARDIOGRAM (TEE) ? ?  ? ?Patient location during evaluation: Endoscopy ?Anesthesia Type: General ?Level of consciousness: awake and alert ?Pain management: pain level controlled ?Vital Signs Assessment: post-procedure vital signs reviewed and stable ?Respiratory status: spontaneous breathing, nonlabored ventilation, respiratory function stable and patient connected to nasal cannula oxygen ?Cardiovascular status: blood pressure returned to baseline and stable ?Postop Assessment: no apparent nausea or vomiting ?Anesthetic complications: no ? ? ?No notable events documented. ? ?Last Vitals:  ?Vitals:  ? 06/06/21 1046 06/06/21 1936  ?BP: 114/74 128/79  ?Pulse: 76 78  ?Resp: 18 20  ?Temp: 36.7 ?C 37.2 ?C  ?SpO2: 95% 95%  ?  ?Last Pain:  ?Vitals:  ? 06/06/21 1936  ?TempSrc: Oral  ?PainSc:   ? ? ?  ?  ?  ?  ?  ?  ? ?Haley Roza ? ? ? ? ?

## 2021-06-13 DIAGNOSIS — Z9981 Dependence on supplemental oxygen: Secondary | ICD-10-CM | POA: Diagnosis not present

## 2021-06-13 DIAGNOSIS — I509 Heart failure, unspecified: Secondary | ICD-10-CM | POA: Diagnosis not present

## 2021-06-17 ENCOUNTER — Telehealth (HOSPITAL_COMMUNITY): Payer: Self-pay | Admitting: Vascular Surgery

## 2021-06-17 DIAGNOSIS — I5042 Chronic combined systolic (congestive) and diastolic (congestive) heart failure: Secondary | ICD-10-CM | POA: Diagnosis not present

## 2021-06-17 DIAGNOSIS — G4733 Obstructive sleep apnea (adult) (pediatric): Secondary | ICD-10-CM | POA: Diagnosis not present

## 2021-06-17 DIAGNOSIS — I110 Hypertensive heart disease with heart failure: Secondary | ICD-10-CM | POA: Diagnosis not present

## 2021-06-17 DIAGNOSIS — R062 Wheezing: Secondary | ICD-10-CM | POA: Diagnosis not present

## 2021-06-17 NOTE — Telephone Encounter (Signed)
Pt lvm wanting to make sure the auth has been received for O2.Marland Kitchen PLEASE ADVISE ?

## 2021-06-18 ENCOUNTER — Telehealth (HOSPITAL_COMMUNITY): Payer: Self-pay

## 2021-06-18 NOTE — Telephone Encounter (Signed)
Called to confirm/remind patient of their appointment at the [facility] on 06/13/21.  ? ?Patient reminded to bring all medications and/or complete list. ? ?Confirmed patient has transportation. Gave directions, instructed to utilize [place] parking. ? ?Confirmed appointment prior to ending call.  ? ?

## 2021-06-19 ENCOUNTER — Encounter (HOSPITAL_COMMUNITY): Payer: Self-pay

## 2021-06-19 ENCOUNTER — Ambulatory Visit (HOSPITAL_COMMUNITY)
Admit: 2021-06-19 | Discharge: 2021-06-19 | Disposition: A | Discharge status: Home / Self Care | Payer: Medicare Other | Source: Ambulatory Visit | Attending: Family Medicine | Admitting: Family Medicine

## 2021-06-19 VITALS — BP 158/84 | HR 71 | Wt 199.0 lb

## 2021-06-19 DIAGNOSIS — I272 Pulmonary hypertension, unspecified: Secondary | ICD-10-CM | POA: Insufficient documentation

## 2021-06-19 DIAGNOSIS — I130 Hypertensive heart and chronic kidney disease with heart failure and stage 1 through stage 4 chronic kidney disease, or unspecified chronic kidney disease: Secondary | ICD-10-CM | POA: Insufficient documentation

## 2021-06-19 DIAGNOSIS — Z9989 Dependence on other enabling machines and devices: Secondary | ICD-10-CM | POA: Insufficient documentation

## 2021-06-19 DIAGNOSIS — Z7901 Long term (current) use of anticoagulants: Secondary | ICD-10-CM | POA: Insufficient documentation

## 2021-06-19 DIAGNOSIS — Z79899 Other long term (current) drug therapy: Secondary | ICD-10-CM | POA: Diagnosis not present

## 2021-06-19 DIAGNOSIS — I340 Nonrheumatic mitral (valve) insufficiency: Secondary | ICD-10-CM | POA: Insufficient documentation

## 2021-06-19 DIAGNOSIS — G4733 Obstructive sleep apnea (adult) (pediatric): Secondary | ICD-10-CM | POA: Insufficient documentation

## 2021-06-19 DIAGNOSIS — J9610 Chronic respiratory failure, unspecified whether with hypoxia or hypercapnia: Secondary | ICD-10-CM | POA: Diagnosis not present

## 2021-06-19 DIAGNOSIS — Z6841 Body Mass Index (BMI) 40.0 and over, adult: Secondary | ICD-10-CM | POA: Insufficient documentation

## 2021-06-19 DIAGNOSIS — Z9981 Dependence on supplemental oxygen: Secondary | ICD-10-CM | POA: Insufficient documentation

## 2021-06-19 DIAGNOSIS — Z993 Dependence on wheelchair: Secondary | ICD-10-CM | POA: Insufficient documentation

## 2021-06-19 DIAGNOSIS — Z7984 Long term (current) use of oral hypoglycemic drugs: Secondary | ICD-10-CM | POA: Diagnosis not present

## 2021-06-19 DIAGNOSIS — I5022 Chronic systolic (congestive) heart failure: Secondary | ICD-10-CM | POA: Diagnosis not present

## 2021-06-19 DIAGNOSIS — N184 Chronic kidney disease, stage 4 (severe): Secondary | ICD-10-CM | POA: Insufficient documentation

## 2021-06-19 LAB — BASIC METABOLIC PANEL
Anion gap: 8 (ref 5–15)
BUN: 56 mg/dL — ABNORMAL HIGH (ref 6–20)
CO2: 27 mmol/L (ref 22–32)
Calcium: 8.7 mg/dL — ABNORMAL LOW (ref 8.9–10.3)
Chloride: 106 mmol/L (ref 98–111)
Creatinine, Ser: 3.25 mg/dL — ABNORMAL HIGH (ref 0.61–1.24)
GFR, Estimated: 22 mL/min — ABNORMAL LOW (ref >60)
Glucose, Bld: 86 mg/dL (ref 70–99)
Potassium: 3.9 mmol/L (ref 3.5–5.1)
Sodium: 141 mmol/L (ref 135–145)

## 2021-06-19 MED ORDER — CARVEDILOL 12.5 MG PO TABS: 12.5000 mg | ORAL_TABLET | Freq: Two times a day (BID) | ORAL | 6 refills | Status: DC

## 2021-06-19 NOTE — Patient Instructions (Signed)
INCREASE Coreg to 12.5 mg, one tab twice a day ? ?Labs today ?We will only contact you if something comes back abnormal or we need to make some changes. ?Otherwise no news is good news! ? ?Your physician recommends that you schedule a follow-up appointment in: 4 weeks with the pharmacy team and in 8 weeks  in the Advanced Practitioners (PA/NP) Clinic  ? ? ? ?Do the following things EVERYDAY: ?Weigh yourself in the morning before breakfast. Write it down and keep it in a log. ?Take your medicines as prescribed ?Eat low salt foods--Limit salt (sodium) to 2000 mg per day.  ?Stay as active as you can everyday ?Limit all fluids for the day to less than 2 liters ? ?At the [facility], you and your health needs are our priority. As part of our continuing mission to provide you with exceptional heart care, we have created designated Provider Care Teams. These Care Teams include your primary Cardiologist (physician) and Advanced Practice Providers (APPs- Physician Assistants and Nurse Practitioners) who all work together to provide you with the care you need, when you need it.  ? ?You may see any of the following providers on your designated Care Team at your next follow up: ?Dr Glori Bickers ?Dr Loralie Champagne ?Darrick Grinder, NP ?Lyda Jester, PA ?Jessica Milford,NP ?Marlyce Huge, PA ?Audry Riles, PharmD ? ? ?Please be sure to bring in all your medications bottles to every appointment.  ? ?If you have any questions or concerns before your next appointment please send [place] a message through [place] or call our office at [phone].   ? ?TO LEAVE A MESSAGE FOR THE NURSE SELECT OPTION 2, PLEASE LEAVE A MESSAGE INCLUDING: ?YOUR NAME ?DATE OF BIRTH ?CALL BACK NUMBER ?REASON FOR CALL**this is important as we prioritize the call backs ? ?YOU WILL RECEIVE A CALL BACK THE SAME DAY AS LONG AS YOU CALL BEFORE 4:00 PM ? ? ?

## 2021-06-19 NOTE — Progress Notes (Signed)
Patient ID: Jerry Edwards, male   DOB: 07-Oct-1970, 51 y.o.   MRN: [id number] ? ? ?ADVANCED HF CLINIC PROGRESS NOTE ? ? ? ?Primary Physician: [name], [place] ?Primary Cardiologist: [name] ?HF: [name]  ? ?HPI: ?[name] is a 51 y.o. male with history of NICM, Chronic systolic heart failure, HTN, OSA, and osteogenic imperfecta.  ? ?Admitted 1/15 with HF symptoms. ECHO 04/08/2013. EF 25%. cMRI 12/15 EF 38% possible infiltrative disease with discrete areas of non-coronary pattern delayed enhancement. Coronaries were normal on LHC.  SPEP no M spike and UPEP no free light chains.  ? ?Admitted 1/17 with recurrent HF, severe HTN and hypoxemia. Diuresed and discharged home.  ? ?Admitted 12/17 with acute respiratory failure and HTN crisis after stopping several meds.  ? ?Admitted 7/18 with atypical CP. Echo and Stress test as below.   ? ?PYP scan 8/18 with ratio of 1.2 (felt to be negative) - unable to have MRI (couldnt fit) ? ?Admitted 6/19 with ADHF. Echo showed lower EF 25-30%. HF medications optimized. DC weight: 216 lbs ? ?Sleep study 8/19 AHI 88/hr ? ?Follow up 3/22  doing well, volume ok, stable NYHA II symptoms. [name] restarted. ? ?Admitted 11/22 for CP, cards consulted and cardiac work up reassuring. Echo showed EF 35-40%. Hstrop 42-> 38 ? ?Seen 02/14/21 at [facility] for psychotic episode. He became combative and left AMA. ? ?Follow up 12/22 he was acutely manic, taken to ED for psych evaluation. ? ?Seen in ED a couple weeks ago for SOB, given Lasix 80 mg IV and instructed to increase Lasix to 80 bid x 3 days. ? ?Admitted 3/23 for a/c CHF and AKI. SCr had increased from prior baseline of 2.1>>3.3. [name] and [name] held. Diuresed w/ IV Lasix. Echo showed EF 30-35%. RV normal. New mitral regurgitation that appeared severe by echo.  RHC showed mild volume overload, moderate mixed pulmonary HTN, No evidence of significant v-waves in PCWP tracing, High cardiac output. He was diuresed further.  TEE was done and showed Mild P2 restriction with 2-3+ (mod-severe central and posterior MR). Findings were d/w structural heart team. Valve is amenable to clipping but not severe enough at this point. Plan to repeat echo in 6 months.  ? ?He was discharged home on the following regimen.  ?Carvedilol 6.25 mg twice a day  ?Hydralazine 50 mg tid ?Imdur 30 mg daily  ?Lasix 80 mg twice a day with extra 40 mg as needed for 3 pound weight gain ?Kdur 20 meq daily ?Asa 81 mg daily  ?Cardura 1 mg qhs ?Jardiance 10  ? ?Discharge wt was 199 lb.  ? ?He returns today for f/u. Reports doing well. Wt is stable at 200 lb. Breathing much improved. Remains on 2L [place] (home baseline). Has not had to increase. Reports full med compliance. BP elevated 158/84. Pulse rate 71 bpm. He reports goo UOP w/ Lasix and Jardiance.  ? ? ?Cardiac studies: ? ?[place] 3/23  ?  ?RA = 9 ?RV = 58/14 ?PA = 61/30 (40) ?PCW = 21 (no significant v-waves) ?Fick cardiac output/index = 7.4/4.0 ?PVR = 2.6 WU ?FA sat = 92% ?PA sat = 69%, 72% ?SVC sat = 74% ?  ?Assessment: ?1. Mild volume overload ?2. Moderate mixed pulmonary HTN ?3. No evidence of significant v-waves in PCWP tracing ?4. High cardiac output ? ? ?TEE 3/23  ?LEFT VENTRICLE: EF = 30-55%. Anterior and anteroapical AK ?RIGHT VENTRICLE: Mild HK ?LEFT ATRIUM: Mildly dilated ?LEFT ATRIAL APPENDAGE: No thrombus.  ?RIGHT  ATRIUM: Normal ?AORTIC VALVE:  Trileaflet. Trivial AI ?MITRAL VALVE:    Mild P2 restriction with 2-3+ (mod-severe central and posterior MR). No significant reversal of flow in PVs ?TRICUSPID VALVE: Normal. Mild TR ?PULMONIC VALVE: Grossly normal. ?INTERATRIAL SEPTUM: Lipomatous hypertrophy. No PFO or ASD. ?PERICARDIUM: Trivial effusion ?DESCENDING AORTA: Not well seen ? ? ?Echo 3/22 EF 30%, severe MR, Normal RV ?Echo 11/22 EF 35-40% ?Echo 10/19 EF 30-35% ?Echo 09/20/17: EF 25-30%, grade 2 DD ?Echo 10/13/16 LVEF 35-40%, Grade 1 DD, Mild MR, Mild LAE ?Echo 1/17 30-35% RV ok  ?Echo 02/22/16 LVEF 40%,  Grade 1 DD.  ? ? ?Myoview 10/15/16 ?No change in EKG from baseline T wave inversions in the inferolateral leads ?There is a medium defect of mild severity present in the basal inferior, mid inferior and apical inferior location. The defect is non-reversible and consistent with prior infarct. ?There is a small defect of moderate severity present in the apex location. The defect is partially reversible and consistent with infarct with possible small area of peri infarct ischemia. ?This is a high risk study. ?EF not calculated but visually appears to be moderately to severely reduced. ? ? ?LHC/RHC 04/08/13  ?Normal coronaries ?RA pressure:  39/39 with a mean of 34. Prominent X. and Y. Descends.  ?RV pressure: 76/26  mean 4 ?PA pressure: 70/36 with a mean of 51  ?Pulmonary capillary wedge pressure: 31/30 and mean of 29  ?LV pressure: 140/26 with a left ventricular end-diastolic pressure of 35  ?PA sat 66%  ?AO sat 97%  ?CO/CI 4.94/2.63  ?Pulmonary vascular resistance: 4.45 Woods units  ? ?PMH: ?1. HTN ?2. Osteogenesis imperfecta: wheelchair-bound.  History of bone fractures.  ?3. Suspect OSA ?4. CKD ?5. Nonischemic cardiomyopathy: Echo (1/15) with EF 20-25%, severe diffuse hypokinesis, mild MR, mildly enlarged RV with moderately decreased systolic function.  LHC/RHC (1/15) with normal coronaries, mean RA 34, PA 70/36 mean 51, mean PCWP 29, CI 2.63.  Cardiac MRI (1/15) with EF 38%, global hypokinesis, discrete areas of delayed enhancement in the subepicardial mid inferoseptal RV insertion site, the mid wall of the mid inferolateral wall, and the subendocardial mid anterolateral wall.  This pattern was suggestive of infiltrative disease.  UPEP/SPEP negative, HIV negative.  Chest CT to look for evidence of sarcoidosis did not show any sarcoid-type lesions.   ? ?SH:  ?Single, prior smoking now quit, occasional marijuana, works as Dispensing optician.  ? ?FH:  ?HTN, no significant cardiac disease.  ? ?Current Outpatient Medications   ?Medication Sig Dispense Refill  ? aspirin 81 MG tablet Take 1 tablet (81 mg total) by mouth daily.    ? carvedilol (COREG) 6.25 MG tablet Take 1 tablet (6.25 mg total) by mouth 2 (two) times daily with a meal. 60 tablet 6  ? doxazosin (CARDURA) 1 MG tablet TAKE 1 TABLET BY MOUTH AT BEDTIME 30 tablet 0  ? empagliflozin (JARDIANCE) 10 MG TABS tablet Take 1 tablet (10 mg total) by mouth daily before breakfast. 30 tablet 4  ? erythromycin ophthalmic ointment Place 1 application into the right eye at bedtime.    ? furosemide (LASIX) 80 MG tablet Take 80 mg twice a day. Can take an extra 40 mg for 3 pound weight gain 75 tablet 6  ? hydrALAZINE (APRESOLINE) 100 MG tablet Take 0.5 tablets (50 mg total) by mouth 3 (three) times daily. 270 tablet 3  ? isosorbide mononitrate (IMDUR) 30 MG 24 hr tablet Take 1 tablet (30 mg total) by mouth daily. 90 tablet  3  ? nitroGLYCERIN (NITROSTAT) 0.4 MG SL tablet Place 1 tablet (0.4 mg total) under the tongue every 5 (five) minutes x 3 doses as needed for chest pain. 25 tablet 12  ? Olopatadine HCl (PATADAY OP) Place 1 drop into both eyes in the morning and at bedtime.    ? OXYGEN Inhale 2 L into the lungs as needed (shortness of breath).    ? potassium chloride SA (KLOR-CON) 20 MEQ tablet Take 1 tablet (20 mEq total) by mouth daily. 30 tablet 6  ? ?No current facility-administered medications for this encounter.  ? ?PHYSICAL EXAM: ?BP (!) 158/84   Pulse 71   Wt 90.3 kg (199 lb)   SpO2 94%   BMI 40.19 kg/m?  ? ?Wt Readings from Last 3 Encounters:  ?06/19/21 90.3 kg (199 lb)  ?06/06/21 91.1 kg (200 lb 13.4 oz)  ?05/10/21 81.6 kg (180 lb)  ?  ?PHYSICAL EXAM: ?General:  short status in WC (Osteogenesis imperfecta). No respiratory difficulty ?HEENT: normal ?Neck: supple. Short thick neck, JVD not well visualized. Carotids 2+ bilat; no bruits. No lymphadenopathy or thyromegaly appreciated. ?Cor: PMI nondisplaced. Regular rate & rhythm. No rubs, gallops or murmurs. ?Lungs: clear ?Abdomen:  soft, nontender, nondistended. No hepatosplenomegaly. No bruits or masses. Good bowel sounds. ?Extremities: no cyanosis, clubbing, rash, edema ?Neuro: alert & oriented x 3, cranial nerves grossly intact. moves all 4 extr

## 2021-06-19 NOTE — Telephone Encounter (Signed)
Form completed, signed by Dr Haroldine Laws, and faxed into [employer], pt is aware this has been done ?

## 2021-06-23 ENCOUNTER — Encounter (HOSPITAL_COMMUNITY): Payer: Medicare Other

## 2021-06-27 LAB — ECHO TEE
AV Mean grad: 4 mmHg
AV Peak grad: 6.6 mmHg
Ao pk vel: 1.28 m/s
MV M vel: 5.04 m/s
MV Peak grad: 101.6 mmHg
Radius: 0.7 cm

## 2021-06-29 ENCOUNTER — Other Ambulatory Visit (HOSPITAL_COMMUNITY): Payer: Self-pay | Admitting: Internal Medicine

## 2021-07-03 DIAGNOSIS — G4733 Obstructive sleep apnea (adult) (pediatric): Secondary | ICD-10-CM | POA: Diagnosis not present

## 2021-07-16 NOTE — Progress Notes (Incomplete)
***In Progress*** ? ?  ?Advanced Heart Failure Clinic Note  ? ?Primary Physician: [place] ?Primary Cardiologist: [name] ?HF: [name]  ? ?HPI:  ?Jerry Edwards is a 51 y.o. male with history of NICM, Chronic systolic heart failure, HTN, OSA, and osteogenic imperfecta.  ?  ?Admitted 03/2013 with HF symptoms. ECHO 04/08/2013. EF 25%. cMRI 02/2014 EF 38% possible infiltrative disease with discrete areas of non-coronary pattern delayed enhancement. Coronaries were normal on LHC.  SPEP no M spike and UPEP no free light chains.  ?  ?Admitted 03/2015 with recurrent HF, severe HTN and hypoxemia. Diuresed and discharged home.  ?  ?Admitted 02/2016 with acute respiratory failure and HTN crisis after stopping several meds.  ?  ?Admitted 09/2016 with atypical CP. Echo LVEF 35-40%, Grade 1 DD, Mild MR, Mild LAE and Stress test as below. *** ?  ?PYP scan 10/2016 with ratio of 1.2 (felt to be negative) - unable to have MRI (couldnt fit) ?  ?Admitted 08/2017 with ADHF. Echo showed lower EF 25-30%. HF medications optimized. DC weight: 216 lbs ?  ?Sleep study 10/2017 AHI 88/hr ?  ?Follow up 05/2020  doing well, volume ok, stable NYHA II symptoms. [name] restarted. ?  ?Admitted 01/2021 for CP, cards consulted and cardiac work up reassuring. Echo showed EF 35-40%. Hstrop 42-> 38 ?  ?Seen 02/14/21 at [facility] for psychotic episode. He became combative and left AMA. ?  ?Follow up 02/2021 he was acutely manic, taken to ED for psych evaluation. ?  ?Seen in ED 04/2021 for SOB, given Lasix 80 mg IV and instructed to increase Lasix to 80 bid x 3 days. ?  ?Admitted 05/2021 for a/c CHF and AKI. SCr had increased from prior baseline of 2.1>>3.3. Jerry Edwards and Jerry Edwards held. Diuresed w/ IV Lasix. Echo showed EF 30-35%. RV normal. New mitral regurgitation that appeared severe by echo.  RHC showed mild volume overload, moderate mixed pulmonary HTN, No evidence of significant v-waves in PCWP tracing, High cardiac output. He was  diuresed further. TEE was done and showed Mild P2 restriction with 2-3+ (mod-severe central and posterior MR). Findings were d/w structural heart team. Valve is amenable to clipping but not severe enough at this point. Plan to repeat echo in 6 months. Discharge wt was 199 lb.  ?  ?He returned 06/19/21 for f/u. Reported doing well. Wt was stable at 200 lb. Breathing was much improved. Remained on 2L [place] (home baseline). Had not needed to increase. Reported full med compliance. BP was elevated 158/84. Pulse rate 71 bpm. He reported good UOP w/ Lasix and Jardiance.  ? ?Today he returns to HF clinic for pharmacist medication titration. At last visit with APP Carvedilol was increased to 12.5 mg BID.  ? ?Shortness of breath/dyspnea on exertion? {YES NO:22349}  ?Orthopnea/PND? {YES NO:22349} ?Edema? {YES NO:22349} ?Lightheadedness/dizziness? {YES NO:22349} ?Daily weights at home? {YES NO:22349} ?Blood pressure/heart rate monitoring at home? {YES NO:22349} ?Following low-sodium/fluid-restricted diet? {YES NO:22349} ? ?HF Medications: ?Carvedilol 12.5 mg BID ?Jardiance 10 mg Daily ?Lasix 80 mg BID ?Hydralazine 50 mg TID ?Imdur 30 mg daily ?Potassium Chloride 20 mEq daily ? ?Has the patient been experiencing any side effects to the medications prescribed?  {YES NO:22349} ? ?Does the patient have any problems obtaining medications due to transportation or finances?   [facility] Medicare/[place] Medicaid ? ?Understanding of regimen: {excellent/good/fair/poor:19665} ?Understanding of indications: {excellent/good/fair/poor:19665} ?Potential of compliance: {excellent/good/fair/poor:19665} ?Patient understands to avoid NSAIDs. ?Patient understands to avoid decongestants. ?  ? ?Pertinent Lab Values: ?06/19/21:  Serum creatinine 3.25, BUN 56, Potassium 3.9, Sodium 141 ? ?Vital Signs: ?Weight: *** (last clinic weight: 199 lbs) ?Blood pressure: *** 158/84 ?Heart rate: *** 71 ? ?Plan: No labs today  ?A: Increase carvedilol to 25 mg BID (can do 18.75 if  his HR is low 60's) ?B: Increase Hydral to 100 mg TID ? ?Assessment/Plan: ?1. Chronic Systolic Heart Failure  ?- unable to fit in cMRI machine due to body stature.  PYP scan 10/2016 no amyloid ?- Echo 09/2017: EF 25-30%, grade 2 DD ?- Echo 12/2017: EF  30-35% ?- Echo 07/2019 EF 35% ?- Echo 01/2021 35-40% ?- Echo 05/2021 EF 30-35%.  RV normal  ?- RHC 05/2021 with mildly elevated filling pressures. High output  ?- Wt stable post d/c. Respiratory status improved. On chronic home O2, 2L/min  ?- Continue Lasix 80 mg twice a day with extra 40 mg as needed for 3 pound weight gain ?- unable to classify functional class, limited mobility due to Osteogenesis imperfecta  ?- Keep off Entresto, spiro w/ elevated SCr  ?- Continue Jardiance 10 mg daily  ?- Continue hydralazine 50 mg TID. ?- Continue Imdur 60 mg ?- Increase Coreg to 12.5 mg bid for better BP control *** ?- Continue Cardura 1 mg qhs *** ?  ?2. Hypertension  ?- moderately elevated ?- increasing  Coreg to 12.5 mg bid *** ?- other GDMT per above  ?  ?3.  Mitral Regurgitation  ?TEE 05/2021 showed Mild P2 restriction with 2-3+ (mod-severe central and posterior MR). Findings were d/w structural heart team. Valve is amenable to clipping but not severe enough at this point. ***  ?- Plan to repeat echo in 6 months.  ?- HF optimization per above, triturating Coreg today  ?  ?4. Chronic Respiratory Failure ?- In setting of chronic CHF. On home O2,  2L [place] ?- Has known OSA on CPAP.  ?  ?5.  Osteogenesis imperfecta  ?- Wheel chair bound; limited mobility.  ?- No change  ?  ?6. OSA  ?- Wears O2 at night.  ?- Had PSG on 11/07/17 with AHI 88/hr c/w very severe OSA.  ?- Follows with Dr. Radford Pax, reports CPAP compliance. ?  ?7. Morbid Obesity  ?-Body mass index is 40.19 kg/m?. *** ?  ?8. Pulmonary hypertension ?- recent RHC c/w moderate mixed pulmonary HTN ?- Combination WHO Groups 2+ 3 ?- continue diuretics, supp O2 and CPAP  ?  ?9. Stage IV CKD  ?- new SCr baseline now ~3  ?- Renal US- no  hydronephrosis  ?- on SGLT2i ?- He will need to establish with Nephrology  ? ?Follow up 08/14/21 with APP clinic and 09/01/21 with [name].  ? ? ?Audry Riles, PharmD, BCPS, BCCP, CPP ?Heart Failure Clinic Pharmacist ?[phone] ? ? ?

## 2021-07-17 ENCOUNTER — Ambulatory Visit (HOSPITAL_COMMUNITY)
Admission: RE | Admit: 2021-07-17 | Discharge: 2021-07-17 | Disposition: A | Discharge status: Home / Self Care | Payer: Medicare Other | Source: Ambulatory Visit | Attending: Cardiology | Admitting: Cardiology

## 2021-07-17 VITALS — BP 164/98 | HR 64 | Wt 206.6 lb

## 2021-07-17 DIAGNOSIS — Z7984 Long term (current) use of oral hypoglycemic drugs: Secondary | ICD-10-CM | POA: Insufficient documentation

## 2021-07-17 DIAGNOSIS — I340 Nonrheumatic mitral (valve) insufficiency: Secondary | ICD-10-CM | POA: Insufficient documentation

## 2021-07-17 DIAGNOSIS — I272 Pulmonary hypertension, unspecified: Secondary | ICD-10-CM | POA: Diagnosis not present

## 2021-07-17 DIAGNOSIS — I5022 Chronic systolic (congestive) heart failure: Secondary | ICD-10-CM | POA: Diagnosis not present

## 2021-07-17 DIAGNOSIS — Z79899 Other long term (current) drug therapy: Secondary | ICD-10-CM | POA: Insufficient documentation

## 2021-07-17 DIAGNOSIS — G4733 Obstructive sleep apnea (adult) (pediatric): Secondary | ICD-10-CM | POA: Insufficient documentation

## 2021-07-17 DIAGNOSIS — Z9981 Dependence on supplemental oxygen: Secondary | ICD-10-CM | POA: Insufficient documentation

## 2021-07-17 DIAGNOSIS — Z634 Disappearance and death of family member: Secondary | ICD-10-CM | POA: Insufficient documentation

## 2021-07-17 DIAGNOSIS — Q780 Osteogenesis imperfecta: Secondary | ICD-10-CM | POA: Insufficient documentation

## 2021-07-17 DIAGNOSIS — Z6841 Body Mass Index (BMI) 40.0 and over, adult: Secondary | ICD-10-CM | POA: Insufficient documentation

## 2021-07-17 DIAGNOSIS — I428 Other cardiomyopathies: Secondary | ICD-10-CM | POA: Diagnosis not present

## 2021-07-17 DIAGNOSIS — J9610 Chronic respiratory failure, unspecified whether with hypoxia or hypercapnia: Secondary | ICD-10-CM | POA: Insufficient documentation

## 2021-07-17 DIAGNOSIS — N184 Chronic kidney disease, stage 4 (severe): Secondary | ICD-10-CM | POA: Diagnosis not present

## 2021-07-17 DIAGNOSIS — I130 Hypertensive heart and chronic kidney disease with heart failure and stage 1 through stage 4 chronic kidney disease, or unspecified chronic kidney disease: Secondary | ICD-10-CM | POA: Insufficient documentation

## 2021-07-17 MED ORDER — HYDRALAZINE HCL 100 MG PO TABS: 100.0000 mg | ORAL_TABLET | Freq: Three times a day (TID) | ORAL | 3 refills | Status: DC

## 2021-07-17 NOTE — Progress Notes (Signed)
?  ?Advanced Heart Failure Clinic Note  ? ?Primary Physician: [place] ?Primary Cardiologist: Dr. Acie Fredrickson ?HF: [name]  ? ?HPI:  ?Jerry Edwards is a 51 y.o. male with history of NICM, chronic systolic heart failure, HTN, OSA, and osteogenic imperfecta.  ?  ?Admitted 03/2013 with HF symptoms. ECHO 04/08/2013. EF 25%. cMRI 02/2014 EF 38% possible infiltrative disease with discrete areas of non-coronary pattern delayed enhancement. Coronaries were normal on LHC.  SPEP no M spike and UPEP no free light chains.  ?  ?Admitted 03/2015 with recurrent HF, severe HTN and hypoxemia. Diuresed and discharged home.  ?  ?Admitted 02/2016 with acute respiratory failure and HTN crisis after stopping several meds.  ?  ?Admitted 09/2016 with atypical CP. Echo LVEF 35-40%, Grade 1 DD, Mild MR, Mild LAE and stress test. ?  ?PYP scan 10/2016 with ratio of 1.2 (felt to be negative) - unable to have MRI (couldn't fit) ?  ?Admitted 08/2017 with ADHF. Echo showed lower EF 25-30%. HF medications optimized. DC weight: 216 lbs. ?  ?Sleep study 10/2017 AHI 88/hr ?  ?Follow up 05/2020 doing well, volume ok, stable NYHA II symptoms. Spironolactone restarted. ?  ?Admitted 01/2021 for CP, cards consulted and cardiac work up reassuring. Echo showed EF 35-40%. Hstrop 42-> 38 ?  ?Seen 02/14/2021 at [facility] for psychotic episode. He became combative and left AMA. ?  ?Follow up 02/2021 he was acutely manic, taken to ED for psych evaluation. ?  ?Seen in ED 04/2021 for SOB, given Lasix 80 mg IV and instructed to increase Lasix to 80 mg BID x 3 days. ?  ?Admitted 05/2021 for a/c CHF and AKI. SCr had increased from prior baseline of 2.1>>3.3. [name] and [place] held. Diuresed w/ IV Lasix. Echo showed EF 30-35%. RV normal. New mitral regurgitation that appeared severe by echo.  RHC showed mild volume overload, moderate mixed pulmonary HTN, no evidence of significant v-waves in PCWP tracing, High cardiac output. He was diuresed further.  TEE was done and showed mild P2 restriction with 2-3+ (mod-severe central and posterior MR). Findings were d/w structural heart team. Valve is amenable to clipping but not severe enough at this point. Plan to repeat echo in 6 months. Discharge wt was 199 lbs.  ?  ?He returned to [facility] 06/19/2021 for f/u. Reported doing well. Wt was stable at 200 lbs. Breathing was much improved. Remained on 2L [place] (home baseline). Had not needed to increase. Reported full med compliance. BP was elevated 158/84. Pulse rate 71 bpm. He reported good UOP w/ Lasix and Jardiance.  ? ?Today he returns to HF clinic for pharmacist medication titration with his mother. At last visit with APP carvedilol was increased to 12.5 mg BID. Overall he's feeling good today. He denies recent episodes of dizziness or lightheadedness. He denies fatigue. He denies recent chest pain or palpitations. He says his breathing is good, he sometimes gets SOB going uphill, but mostly is fine. He has not been consistently taking his weight at home recently given multiple deaths in his family, but says it has been slowly trending up, and attributes it to eating more red meat. He says his weight when he was taking it consistently was stable at 199 lbs. He takes furosemide 80 mg BID, denies taking any extra doses and says he's still getting good UOP with this dose. No LEE on exam, he denies any PND/orthopnea. Uses CPAP at home. He does not feel he is carrying any extra fluid  currently, he says when he is carrying extra fluid he feels his back is tight between his shoulder blades. He says his appetite is alright and he typically eats two meals a day. He denies any episodes of early satiety, and endorses a low salt diet.  ? ?HF Medications: ?Carvedilol 12.5 mg BID ?Jardiance 10 mg daily ?Hydralazine 50 mg TID ?Imdur 30 mg daily ?Lasix 80 mg BID ?Potassium Chloride 20 mEq daily ? ?Has the patient been experiencing any side effects to the medications prescribed?   No ? ?Does the patient have any problems obtaining medications due to transportation or finances?   [facility] Medicare/[place] Medicaid ?Understanding of regimen: good ?Understanding of indications: good ?Potential of compliance: good ?Patient understands to avoid NSAIDs. ?Patient understands to avoid decongestants. ?  ? ?Pertinent Lab Values: ?Labs 06/19/2021: Serum creatinine 3.25, BUN 56, Potassium 3.9, Sodium 141 ? ?Vital Signs: ?Weight: 206 lbs (last clinic weight: 199 lbs) ?Blood pressure: 164/98 mmHg ?Heart rate: 64 bpm ? ?Assessment/Plan: ?1. Chronic Systolic Heart Failure  ?- unable to fit in cMRI machine due to body stature.  PYP scan 10/2016 no amyloid ?- Echo 09/2017: EF 25-30%, grade 2 DD ?- Echo 12/2017: EF  30-35% ?- Echo 07/2019 EF 35% ?- Echo 01/2021 35-40% ?- Echo 05/2021 EF 30-35%.  RV normal  ?- RHC 05/2021 with mildly elevated filling pressures. High output  ?- Unable to classify functional class, limited mobility due to Osteogenesis imperfecta.  ?- Weight has trended up ~6 lbs, which he attributes to dietary indiscretions. Counseled patient on the importance of taking his weight daily.  ?- Respiratory status improved. On chronic home O2, 2L/min  ?- Continue Lasix 80 mg BID with extra 40 mg as needed for 3 pound weight gain ?- Continue carvedilol 12.5 mg BID ?- Keep off Entresto, spiro w/ elevated SCr  ?- Continue Jardiance 10 mg daily - monitor Scr closely ?- Increase hydralazine to 100 mg TID. ?- Continue Imdur 30 mg daily. Consider increasing to Imdur 60 mg daily at next visit. ?  ?2. Hypertension  ?- moderately elevated ?- Increasing hydralazine as above ?- Continue doxazosin 1 mg QHS ?- other GDMT per above  ?  ?3.  Mitral Regurgitation  ?TEE 05/2021 showed Mild P2 restriction with 2-3+ (mod-severe central and posterior MR). Findings were d/w structural heart team. Valve is amenable to clipping but not severe enough at this point.   ?- Plan to repeat echo in 5 months.  ?- HF optimization per above ?  ?4.  Chronic Respiratory Failure ?- In setting of chronic CHF. On home O2,  2L [place] ?- Has known OSA on CPAP.  ?  ?5.  Osteogenesis imperfecta  ?- Wheel chair bound; limited mobility.  ?- No change  ?  ?6. OSA  ?- Wears O2 at night.  ?- Had PSG on 11/07/2017 with AHI 88/hr c/w very severe OSA.  ?- Follows with Dr. Radford Pax, reports CPAP compliance. ?  ?7. Morbid Obesity  ?-Body mass index is 41.73 kg/m?. ?  ?8. Pulmonary hypertension ?- recent RHC c/w moderate mixed pulmonary HTN ?- Combination WHO Groups 2+ 3 ?- continue diuretics, supp O2 and CPAP  ?  ?9. Stage IV CKD  ?- new SCr baseline now ~3  ?- Renal US- no hydronephrosis  ?- on SGLT2i ?- He will need to establish with Nephrology  ? ?Follow up 08/14/2021 with APP clinic and 09/01/2021 with [name].  ? ?Audry Riles, PharmD, BCPS, BCCP, CPP ?Heart Failure Clinic Pharmacist ?[phone] ? ? ?

## 2021-07-17 NOTE — Patient Instructions (Addendum)
It was a pleasure seeing you today! ? ?MEDICATIONS: ?-We are changing your medications today ?-Increase hydralazine to 100 mg (1 tablet) three times daily ?-Call if you have questions about your medications. ? ?LABS: ?-We will call you if your labs need attention. ? ?NEXT APPOINTMENT: ?Return to clinic in 08/14/21 with APP/NP. ? ?In general, to take care of your heart failure: ?-Limit your fluid intake to 2 Liters (half-gallon) per day.   ?-Limit your salt intake to ideally 2-3 grams (2000-3000 mg) per day. ?-Weigh yourself daily and record, and bring that "weight diary" to your next appointment.  (Weight gain of 2-3 pounds in 1 day typically means fluid weight.) ?-The medications for your heart are to help your heart and help you live longer.   ?-Please contact us before stopping any of your heart medications. ? ?Call the clinic at [phone] with questions or to reschedule future appointments.  ?

## 2021-07-18 DIAGNOSIS — I5042 Chronic combined systolic (congestive) and diastolic (congestive) heart failure: Secondary | ICD-10-CM | POA: Diagnosis not present

## 2021-07-18 DIAGNOSIS — I110 Hypertensive heart disease with heart failure: Secondary | ICD-10-CM | POA: Diagnosis not present

## 2021-07-18 DIAGNOSIS — R062 Wheezing: Secondary | ICD-10-CM | POA: Diagnosis not present

## 2021-07-18 DIAGNOSIS — G4733 Obstructive sleep apnea (adult) (pediatric): Secondary | ICD-10-CM | POA: Diagnosis not present

## 2021-07-22 ENCOUNTER — Other Ambulatory Visit (HOSPITAL_COMMUNITY): Payer: Self-pay | Admitting: Internal Medicine

## 2021-08-12 NOTE — Progress Notes (Signed)
Patient ID: Jerry Edwards, male   DOB: [birth year], 51 y.o.   MRN: [id number]   ADVANCED HF CLINIC PROGRESS NOTE    Primary Physician: [facility] Primary Cardiologist: [name] HF: [name]   HPI: Jerry Edwards is a 51 y.o. male with history of NICM, Chronic systolic heart failure, HTN, OSA, and osteogenic imperfecta.   Admitted 1/15 with HF symptoms. ECHO 04/08/2013. EF 25%. cMRI 12/15 EF 38% possible infiltrative disease with discrete areas of non-coronary pattern delayed enhancement. Coronaries were normal on LHC.  SPEP no M spike and UPEP no free light chains.   Admitted 1/17 with recurrent HF, severe HTN and hypoxemia. Diuresed and discharged home.   Admitted 12/17 with acute respiratory failure and HTN crisis after stopping several meds.   Admitted 7/18 with atypical CP. Echo and Stress test as below.    PYP scan 8/18 with ratio of 1.2 (felt to be negative) - unable to have MRI (couldnt fit)  Admitted 6/19 with ADHF. Echo showed lower EF 25-30%. HF medications optimized. DC weight: 216 lbs  Sleep study 8/19 AHI 88/hr  Follow up 3/22  doing well, volume ok, stable NYHA II symptoms. [name] restarted.  Admitted 11/22 for CP, cards consulted and cardiac work up reassuring. Echo showed EF 35-40%. Hstrop 42-> 38  Seen 02/14/21 at [facility] for psychotic episode. He became combative and left AMA.  Follow up 12/22 he was acutely manic, taken to ED for psych evaluation.  Seen in ED a couple weeks ago for SOB, given Lasix 80 mg IV and instructed to increase Lasix to 80 bid x 3 days.  Admitted 3/23 for a/c CHF and AKI. SCr had increased from prior baseline of 2.1>>3.3. [name] and [place] held. Diuresed w/ IV Lasix. Echo showed EF 30-35%. RV normal. New mitral regurgitation that appeared severe by echo.  RHC showed mild volume overload, moderate mixed pulmonary HTN, No evidence of significant v-waves in PCWP tracing, High cardiac output. He was diuresed further.  TEE was done and showed Mild P2 restriction with 2-3+ (mod-severe central and posterior MR). Findings were d/w structural heart team. Valve is amenable to clipping but not severe enough at this point. Plan to repeat echo in 6 months.   He was discharged home on the following regimen.  Carvedilol 6.25 mg twice a day  Hydralazine 50 mg tid Imdur 30 mg daily  Lasix 80 mg twice a day with extra 40 mg as needed for 3 pound weight gain Kdur 20 meq daily Asa 81 mg daily  Cardura 1 mg qhs Jardiance 10   Discharge wt was 199 lb.   He returns today for f/u. Reports doing well. Wt is stable at 200 lb. Breathing much improved. Remains on 2L [place] (home baseline). Has not had to increase. Reports full med compliance. BP elevated 158/84. Pulse rate 71 bpm. He reports goo UOP w/ Lasix and Jardiance.    Cardiac studies:  RHC 3/23    RA = 9 RV = 58/14 PA = 61/30 (40) PCW = 21 (no significant v-waves) Fick cardiac output/index = 7.4/4.0 PVR = 2.6 WU FA sat = 92% PA sat = 69%, 72% SVC sat = 74%   Assessment: 1. Mild volume overload 2. Moderate mixed pulmonary HTN 3. No evidence of significant v-waves in PCWP tracing 4. High cardiac output   TEE 3/23  LEFT VENTRICLE: EF = 30-55%. Anterior and anteroapical AK RIGHT VENTRICLE: Mild HK LEFT ATRIUM: Mildly dilated LEFT ATRIAL APPENDAGE: No thrombus.  RIGHT  ATRIUM: Normal AORTIC VALVE:  Trileaflet. Trivial AI MITRAL VALVE:    Mild P2 restriction with 2-3+ (mod-severe central and posterior MR). No significant reversal of flow in PVs TRICUSPID VALVE: Normal. Mild TR PULMONIC VALVE: Grossly normal. INTERATRIAL SEPTUM: Lipomatous hypertrophy. No PFO or ASD. PERICARDIUM: Trivial effusion DESCENDING AORTA: Not well seen   Echo 3/22 EF 30%, severe MR, Normal RV Echo 11/22 EF 35-40% Echo 10/19 EF 30-35% Echo 09/20/17: EF 25-30%, grade 2 DD Echo 10/13/16 LVEF 35-40%, Grade 1 DD, Mild MR, Mild LAE Echo 1/17 30-35% RV ok  Echo 02/22/16 LVEF 40%,  Grade 1 DD.    Myoview 10/15/16 No change in EKG from baseline T wave inversions in the inferolateral leads There is a medium defect of mild severity present in the basal inferior, mid inferior and apical inferior location. The defect is non-reversible and consistent with prior infarct. There is a small defect of moderate severity present in the apex location. The defect is partially reversible and consistent with infarct with possible small area of peri infarct ischemia. This is a high risk study. EF not calculated but visually appears to be moderately to severely reduced.   LHC/RHC 04/08/13  Normal coronaries RA pressure:  39/39 with a mean of 34. Prominent X. and Y. Descends.  RV pressure: 76/26  mean 4 PA pressure: 70/36 with a mean of 51  Pulmonary capillary wedge pressure: 31/30 and mean of 29  LV pressure: 140/26 with a left ventricular end-diastolic pressure of 35  PA sat 66%  AO sat 97%  CO/CI 4.94/2.63  Pulmonary vascular resistance: 4.45 Woods units   PMH: 1. HTN 2. Osteogenesis imperfecta: wheelchair-bound.  History of bone fractures.  3. Suspect OSA 4. CKD 5. Nonischemic cardiomyopathy: Echo (1/15) with EF 20-25%, severe diffuse hypokinesis, mild MR, mildly enlarged RV with moderately decreased systolic function.  LHC/RHC (1/15) with normal coronaries, mean RA 34, PA 70/36 mean 51, mean PCWP 29, CI 2.63.  Cardiac MRI (1/15) with EF 38%, global hypokinesis, discrete areas of delayed enhancement in the subepicardial mid inferoseptal RV insertion site, the mid wall of the mid inferolateral wall, and the subendocardial mid anterolateral wall.  This pattern was suggestive of infiltrative disease.  UPEP/SPEP negative, HIV negative.  Chest CT to look for evidence of sarcoidosis did not show any sarcoid-type lesions.    SH:  Single, prior smoking now quit, occasional marijuana, works as Dispensing optician.   FH:  HTN, no significant cardiac disease.   Current Outpatient Medications   Medication Sig Dispense Refill   aspirin 81 MG tablet Take 1 tablet (81 mg total) by mouth daily.     carvedilol (COREG) 12.5 MG tablet Take 1 tablet (12.5 mg total) by mouth 2 (two) times daily with a meal. 60 tablet 6   doxazosin (CARDURA) 1 MG tablet TAKE 1 TABLET BY MOUTH AT BEDTIME 30 tablet 11   empagliflozin (JARDIANCE) 10 MG TABS tablet Take 1 tablet (10 mg total) by mouth daily before breakfast. 30 tablet 4   erythromycin ophthalmic ointment Place 1 application into the right eye at bedtime.     furosemide (LASIX) 80 MG tablet Take 80 mg twice a day. Can take an extra 40 mg for 3 pound weight gain 75 tablet 6   hydrALAZINE (APRESOLINE) 100 MG tablet Take 1 tablet (100 mg total) by mouth 3 (three) times daily. 270 tablet 3   isosorbide mononitrate (IMDUR) 30 MG 24 hr tablet Take 1 tablet (30 mg total) by mouth daily. 90 tablet  3   nitroGLYCERIN (NITROSTAT) 0.4 MG SL tablet Place 1 tablet (0.4 mg total) under the tongue every 5 (five) minutes x 3 doses as needed for chest pain. 25 tablet 12   Olopatadine HCl (PATADAY OP) Place 1 drop into both eyes in the morning and at bedtime.     OXYGEN Inhale 2 L into the lungs as needed (shortness of breath).     potassium chloride SA (KLOR-CON M) 20 MEQ tablet Take 1 tablet by mouth once daily 90 tablet 3   No current facility-administered medications for this visit.   PHYSICAL EXAM: There were no vitals taken for this visit.  Wt Readings from Last 3 Encounters:  07/17/21 93.7 kg (206 lb 9.6 oz)  06/19/21 90.3 kg (199 lb)  06/06/21 91.1 kg (200 lb 13.4 oz)    PHYSICAL EXAM: General:  short status in WC (Osteogenesis imperfecta). No respiratory difficulty HEENT: normal Neck: supple. Short thick neck, JVD not well visualized. Carotids 2+ bilat; no bruits. No lymphadenopathy or thyromegaly appreciated. Cor: PMI nondisplaced. Regular rate & rhythm. No rubs, gallops or murmurs. Lungs: clear Abdomen: soft, nontender, nondistended. No  hepatosplenomegaly. No bruits or masses. Good bowel sounds. Extremities: no cyanosis, clubbing, rash, edema Neuro: alert & oriented x 3, cranial nerves grossly intact. moves all 4 extremities w/o difficulty. Affect pleasant.    ASSESSMENT & PLAN: 1. Chronic Systolic Heart Failure  - unable to fit in cMRI machine due to body stature.  PYP scan 8/18 no amyloid - Echo 09/2017: EF 25-30%, grade 2 DD - Echo 10/19: EF  30-35% - Echo 5/21 EF 35% - Echo 11/22 35-40% - Echo 3/23 EF 30-35%.  RV normal  - RHC 3/23 with mildly elevated filling pressures. High output  - Wt stable post d/c. Respiratory status improved. On chronic home O2, 2L/min  - Continue Lasix 80 mg twice a day with extra 40 mg as needed for 3 pound weight gain - unable to classify functional class, limited mobility due to Osteogenesis imperfecta  - Keep off Entresto, spiro  w/ elevated SCr  - Continue Jardiance 10 mg daily  - Continue hydralazine 50 mg TID. - Continue Imdur 60 mg - Increase Coreg to 12.5 mg bid for better BP control  - Continue Cardura 1 mg qhs  - BMP today   2. Hypertension  - moderately elevated - increasing  Coreg to 12.5 mg bid - other GDMT per above  - BMP today    3.  Mitral Regurgitation  TEE 3/23 showed Mild P2 restriction with 2-3+ (mod-severe central and posterior MR). Findings were d/w structural heart team. Valve is amenable to clipping but not severe enough at this point.  - Plan to repeat echo in 6 months.  - HF optimization per above, triturating Coreg today   4. Chronic Respiratory Failure - In setting of chronic CHF. On home O2,  2L [place] - Has known OSA on CPAP.    5.  Osteogenesis imperfecta  - Wheel chair bound; limited mobility.  - No change    6. OSA  - Wears O2 at night.  - Had PSG on 11/07/17 with AHI 88/hr c/w very severe OSA.  - Follows with Dr. Radford Pax, reports CPAP compliance.   7. Morbid Obesity  -Body mass index is 40.19 kg/m.   8. Pulmonary hypertension - recent  RHC c/w moderate mixed pulmonary HTN - Combination WHO Groups 2+ 3 - continue diuretics, supp O2 and CPAP    9. Stage IV CKD  -  new SCr baseline now ~3  - Renal US- no hydronephrosis  - on SGLT2i - Check BMP today  - He will need to establish with Nephrology   F/u w/ PharmD in 4 weeks for f/u of HTN and further GDMT titration. APP in 8 weeks. [name] in 12 wks    Lyda Jester, PA-C  06/19/2021, 2:28 PM  Advanced Heart Failure Team Pager [phone] (M-F; 7a - 5p)  Please contact [facility] for night-coverage after hours (5p -7a ) and weekends on [email or web address]

## 2021-08-13 ENCOUNTER — Telehealth (HOSPITAL_COMMUNITY): Payer: Self-pay

## 2021-08-13 NOTE — Telephone Encounter (Signed)
Called and left patient a voice message to confirm/remind patient of their appointment at [facility] on 08/14/21.

## 2021-08-14 ENCOUNTER — Ambulatory Visit (HOSPITAL_COMMUNITY)
Admission: RE | Admit: 2021-08-14 | Discharge: 2021-08-14 | Disposition: A | Discharge status: Home / Self Care | Payer: Medicare Other | Source: Ambulatory Visit | Attending: Family Medicine | Admitting: Family Medicine

## 2021-08-14 ENCOUNTER — Encounter (HOSPITAL_COMMUNITY): Payer: Self-pay

## 2021-08-14 VITALS — BP 142/72 | HR 63 | Wt 203.0 lb

## 2021-08-14 DIAGNOSIS — J9610 Chronic respiratory failure, unspecified whether with hypoxia or hypercapnia: Secondary | ICD-10-CM | POA: Insufficient documentation

## 2021-08-14 DIAGNOSIS — I10 Essential (primary) hypertension: Secondary | ICD-10-CM

## 2021-08-14 DIAGNOSIS — I428 Other cardiomyopathies: Secondary | ICD-10-CM | POA: Insufficient documentation

## 2021-08-14 DIAGNOSIS — I272 Pulmonary hypertension, unspecified: Secondary | ICD-10-CM | POA: Insufficient documentation

## 2021-08-14 DIAGNOSIS — Z9981 Dependence on supplemental oxygen: Secondary | ICD-10-CM | POA: Diagnosis not present

## 2021-08-14 DIAGNOSIS — Z7984 Long term (current) use of oral hypoglycemic drugs: Secondary | ICD-10-CM | POA: Diagnosis not present

## 2021-08-14 DIAGNOSIS — I340 Nonrheumatic mitral (valve) insufficiency: Secondary | ICD-10-CM | POA: Insufficient documentation

## 2021-08-14 DIAGNOSIS — Z993 Dependence on wheelchair: Secondary | ICD-10-CM | POA: Diagnosis not present

## 2021-08-14 DIAGNOSIS — N184 Chronic kidney disease, stage 4 (severe): Secondary | ICD-10-CM

## 2021-08-14 DIAGNOSIS — Z7982 Long term (current) use of aspirin: Secondary | ICD-10-CM | POA: Diagnosis not present

## 2021-08-14 DIAGNOSIS — G4733 Obstructive sleep apnea (adult) (pediatric): Secondary | ICD-10-CM

## 2021-08-14 DIAGNOSIS — Z79899 Other long term (current) drug therapy: Secondary | ICD-10-CM | POA: Diagnosis not present

## 2021-08-14 DIAGNOSIS — Z7902 Long term (current) use of antithrombotics/antiplatelets: Secondary | ICD-10-CM | POA: Diagnosis not present

## 2021-08-14 DIAGNOSIS — I5023 Acute on chronic systolic (congestive) heart failure: Secondary | ICD-10-CM | POA: Diagnosis not present

## 2021-08-14 DIAGNOSIS — Q780 Osteogenesis imperfecta: Secondary | ICD-10-CM | POA: Diagnosis not present

## 2021-08-14 DIAGNOSIS — Z7983 Long term (current) use of bisphosphonates: Secondary | ICD-10-CM | POA: Insufficient documentation

## 2021-08-14 DIAGNOSIS — I5022 Chronic systolic (congestive) heart failure: Secondary | ICD-10-CM | POA: Insufficient documentation

## 2021-08-14 DIAGNOSIS — I130 Hypertensive heart and chronic kidney disease with heart failure and stage 1 through stage 4 chronic kidney disease, or unspecified chronic kidney disease: Secondary | ICD-10-CM | POA: Insufficient documentation

## 2021-08-14 DIAGNOSIS — Z6841 Body Mass Index (BMI) 40.0 and over, adult: Secondary | ICD-10-CM | POA: Diagnosis not present

## 2021-08-14 LAB — BASIC METABOLIC PANEL
Anion gap: 9 (ref 5–15)
BUN: 56 mg/dL — ABNORMAL HIGH (ref 6–20)
CO2: 27 mmol/L (ref 22–32)
Calcium: 8.5 mg/dL — ABNORMAL LOW (ref 8.9–10.3)
Chloride: 106 mmol/L (ref 98–111)
Creatinine, Ser: 3.25 mg/dL — ABNORMAL HIGH (ref 0.61–1.24)
GFR, Estimated: 22 mL/min — ABNORMAL LOW (ref >60)
Glucose, Bld: 86 mg/dL (ref 70–99)
Potassium: 3.5 mmol/L (ref 3.5–5.1)
Sodium: 142 mmol/L (ref 135–145)

## 2021-08-14 MED ORDER — DOXAZOSIN MESYLATE 2 MG PO TABS: 2.0000 mg | ORAL_TABLET | Freq: Every day | ORAL | 3 refills | Status: DC

## 2021-08-14 NOTE — Patient Instructions (Signed)
Increase your Cardura to '2mg'$  nightly.   Labs done today, your results will be available in MyChart, we will contact you for abnormal readings.  Your physician recommends that you schedule a follow-up appointment as scheduled.  If you have any questions or concerns before your next appointment please send [place] a message through [place] or call our office at [phone].    TO LEAVE A MESSAGE FOR THE NURSE SELECT OPTION 2, PLEASE LEAVE A MESSAGE INCLUDING: YOUR NAME DATE OF BIRTH CALL BACK NUMBER REASON FOR CALL**this is important as we prioritize the call backs  YOU WILL RECEIVE A CALL BACK THE SAME DAY AS LONG AS YOU CALL BEFORE 4:00 PM  At the [facility], you and your health needs are our priority. As part of our continuing mission to provide you with exceptional heart care, we have created designated Provider Care Teams. These Care Teams include your primary Cardiologist (physician) and Advanced Practice Providers (APPs- Physician Assistants and Nurse Practitioners) who all work together to provide you with the care you need, when you need it.   You may see any of the following providers on your designated Care Team at your next follow up: Dr Glori Bickers Dr Haynes Kerns, NP Lyda Jester, [place] [facility] [place], [place] Audry Riles, PharmD   Please be sure to bring in all your medications bottles to every appointment.

## 2021-08-17 DIAGNOSIS — R062 Wheezing: Secondary | ICD-10-CM | POA: Diagnosis not present

## 2021-08-17 DIAGNOSIS — I110 Hypertensive heart disease with heart failure: Secondary | ICD-10-CM | POA: Diagnosis not present

## 2021-08-17 DIAGNOSIS — G4733 Obstructive sleep apnea (adult) (pediatric): Secondary | ICD-10-CM | POA: Diagnosis not present

## 2021-08-17 DIAGNOSIS — I5042 Chronic combined systolic (congestive) and diastolic (congestive) heart failure: Secondary | ICD-10-CM | POA: Diagnosis not present

## 2021-09-01 ENCOUNTER — Encounter (HOSPITAL_COMMUNITY): Payer: Self-pay | Admitting: Internal Medicine

## 2021-09-01 ENCOUNTER — Ambulatory Visit (HOSPITAL_COMMUNITY)
Admission: RE | Admit: 2021-09-01 | Discharge: 2021-09-01 | Disposition: A | Discharge status: Home / Self Care | Payer: Medicare Other | Source: Ambulatory Visit | Attending: Internal Medicine | Admitting: Internal Medicine

## 2021-09-01 VITALS — BP 160/80 | HR 66 | Wt 204.8 lb

## 2021-09-01 DIAGNOSIS — N184 Chronic kidney disease, stage 4 (severe): Secondary | ICD-10-CM | POA: Diagnosis not present

## 2021-09-01 DIAGNOSIS — J9610 Chronic respiratory failure, unspecified whether with hypoxia or hypercapnia: Secondary | ICD-10-CM | POA: Insufficient documentation

## 2021-09-01 DIAGNOSIS — Z7984 Long term (current) use of oral hypoglycemic drugs: Secondary | ICD-10-CM | POA: Diagnosis not present

## 2021-09-01 DIAGNOSIS — I130 Hypertensive heart and chronic kidney disease with heart failure and stage 1 through stage 4 chronic kidney disease, or unspecified chronic kidney disease: Secondary | ICD-10-CM | POA: Diagnosis not present

## 2021-09-01 DIAGNOSIS — G4733 Obstructive sleep apnea (adult) (pediatric): Secondary | ICD-10-CM | POA: Insufficient documentation

## 2021-09-01 DIAGNOSIS — I340 Nonrheumatic mitral (valve) insufficiency: Secondary | ICD-10-CM | POA: Diagnosis not present

## 2021-09-01 DIAGNOSIS — I5022 Chronic systolic (congestive) heart failure: Secondary | ICD-10-CM | POA: Diagnosis not present

## 2021-09-01 DIAGNOSIS — Z79899 Other long term (current) drug therapy: Secondary | ICD-10-CM | POA: Diagnosis not present

## 2021-09-01 DIAGNOSIS — Z6841 Body Mass Index (BMI) 40.0 and over, adult: Secondary | ICD-10-CM | POA: Diagnosis not present

## 2021-09-01 DIAGNOSIS — I272 Pulmonary hypertension, unspecified: Secondary | ICD-10-CM | POA: Diagnosis not present

## 2021-09-01 DIAGNOSIS — Z993 Dependence on wheelchair: Secondary | ICD-10-CM | POA: Insufficient documentation

## 2021-09-01 DIAGNOSIS — Q780 Osteogenesis imperfecta: Secondary | ICD-10-CM | POA: Diagnosis not present

## 2021-09-01 NOTE — Progress Notes (Signed)
Patient ID: Jerry Edwards, male   DOB: April 06, 1970, 51 y.o.   MRN: [id number]   ADVANCED HF CLINIC PROGRESS NOTE    Primary Physician: [place] Primary Cardiologist: [name] HF Cardiologist: [name]   HPI: Jerry Edwards is a 51 y.o. male with history of NICM, Chronic systolic heart failure, HTN, OSA, and osteogenic imperfecta.   Admitted 1/15 with HF symptoms. ECHO 04/08/2013. EF 25%. cMRI 12/15 EF 38% possible infiltrative disease with discrete areas of non-coronary pattern delayed enhancement. Coronaries were normal on LHC.  SPEP no M spike and UPEP no free light chains.   Admitted 1/17 with recurrent HF, severe HTN and hypoxemia. Diuresed and discharged home.   Admitted 12/17 with acute respiratory failure and HTN crisis after stopping several meds.   Admitted 7/18 with atypical CP. Echo and Stress test as below.    PYP scan 8/18 with ratio of 1.2 (felt to be negative) - unable to have MRI (couldnt fit)  Admitted 6/19 with ADHF. Echo showed lower EF 25-30%. HF medications optimized. DC weight: 216 lbs  Sleep study 8/19 AHI 88/hr  Admitted 11/22 for CP, cards consulted and cardiac work up reassuring. Echo showed EF 35-40%. Hstrop 42-> 38  Seen 02/14/21 at [facility] for psychotic episode. He became combative and left AMA.  Follow up 12/22 he was acutely manic, taken to ED for psych evaluation.  Seen in ED 2/23 for SOB, given Lasix 80 mg IV and instructed to increase Lasix to 80 bid x 3 days.  Admitted 3/23 for a/c CHF and AKI. [name] and [place] held. Diuresed w/ IV Lasix. Echo showed EF 30-35%. RV normal. New mitral regurgitation that appeared severe by echo.  RHC showed mild volume overload, moderate mixed pulmonary HTN, No evidence of significant v-waves in PCWP tracing, High cardiac output. He was diuresed further. TEE showed Mild P2 restriction with 2-3+ (mod-severe central and posterior MR). Findings were d/w structural heart team. Valve is amenable  to clipping but not severe enough at this point. Plan to repeat echo in 6 months. Discharged home, weight 199 lbs.  Follow up 3/23, doing well, volume stable. Remains off ARNi/MRA with CKD. Coreg increased for BP control.    Cardiac studies:  - RHC (3/23):    RA = 9 RV = 58/14 PA = 61/30 (40) PCW = 21 (no significant v-waves) Fick cardiac output/index = 7.4/4.0 PVR = 2.6 WU FA sat = 92% PA sat = 69%, 72% SVC sat = 74%   Assessment: 1. Mild volume overload 2. Moderate mixed pulmonary HTN 3. No evidence of significant v-waves in PCWP tracing 4. High cardiac output   - TEE (3/23):  LEFT VENTRICLE: EF = 30-55%. Anterior and anteroapical AK RIGHT VENTRICLE: Mild HK LEFT ATRIUM: Mildly dilated LEFT ATRIAL APPENDAGE: No thrombus.  RIGHT ATRIUM: Normal AORTIC VALVE:  Trileaflet. Trivial AI MITRAL VALVE:    Mild P2 restriction with 2-3+ (mod-severe central and posterior MR). No significant reversal of flow in PVs TRICUSPID VALVE: Normal. Mild TR PULMONIC VALVE: Grossly normal. INTERATRIAL SEPTUM: Lipomatous hypertrophy. No PFO or ASD. PERICARDIUM: Trivial effusion DESCENDING AORTA: Not well seen   Echo 3/22 EF 30%, severe MR, Normal RV Echo 11/22 EF 35-40% Echo 10/19 EF 30-35% Echo 09/20/17: EF 25-30%, grade 2 DD Echo 10/13/16 LVEF 35-40%, Grade 1 DD, Mild MR, Mild LAE Echo 1/17 30-35% RV ok  Echo 02/22/16 LVEF 40%, Grade 1 DD.    - Myoview 10/15/16 No change in EKG from baseline T wave  inversions in the inferolateral leads There is a medium defect of mild severity present in the basal inferior, mid inferior and apical inferior location. The defect is non-reversible and consistent with prior infarct. There is a small defect of moderate severity present in the apex location. The defect is partially reversible and consistent with infarct with possible small area of peri infarct ischemia. This is a high risk study. EF not calculated but visually appears to be moderately to  severely reduced.   LHC/RHC 04/08/13  Normal coronaries RA pressure:  39/39 with a mean of 34. Prominent X. and Y. Descends.  RV pressure: 76/26  mean 4 PA pressure: 70/36 with a mean of 51  Pulmonary capillary wedge pressure: 31/30 and mean of 29  LV pressure: 140/26 with a left ventricular end-diastolic pressure of 35  PA sat 66%  AO sat 97%  CO/CI 4.94/2.63  Pulmonary vascular resistance: 4.45 Woods units   PMH: 1. HTN 2. Osteogenesis imperfecta: wheelchair-bound.  History of bone fractures.  3. Suspect OSA 4. CKD 5. Nonischemic cardiomyopathy: Echo (1/15) with EF 20-25%, severe diffuse hypokinesis, mild MR, mildly enlarged RV with moderately decreased systolic function.  LHC/RHC (1/15) with normal coronaries, mean RA 34, PA 70/36 mean 51, mean PCWP 29, CI 2.63.  Cardiac MRI (1/15) with EF 38%, global hypokinesis, discrete areas of delayed enhancement in the subepicardial mid inferoseptal RV insertion site, the mid wall of the mid inferolateral wall, and the subendocardial mid anterolateral wall.  This pattern was suggestive of infiltrative disease.  UPEP/SPEP negative, HIV negative.  Chest CT to look for evidence of sarcoidosis did not show any sarcoid-type lesions.    SH:  Single, prior smoking now quit, occasional marijuana, works as Dispensing optician.   FH:  HTN, no significant cardiac disease.   Current Outpatient Medications  Medication Sig Dispense Refill   aspirin 81 MG tablet Take 1 tablet (81 mg total) by mouth daily.     carvedilol (COREG) 12.5 MG tablet Take 1 tablet (12.5 mg total) by mouth 2 (two) times daily with a meal. 60 tablet 6   doxazosin (CARDURA) 2 MG tablet Take 1 tablet (2 mg total) by mouth at bedtime. 60 tablet 3   empagliflozin (JARDIANCE) 10 MG TABS tablet Take 1 tablet (10 mg total) by mouth daily before breakfast. 30 tablet 4   erythromycin ophthalmic ointment Place 1 application into the right eye at bedtime.     furosemide (LASIX) 80 MG tablet Take 80 mg twice a  day. Can take an extra 40 mg for 3 pound weight gain 75 tablet 6   hydrALAZINE (APRESOLINE) 100 MG tablet Take 1 tablet (100 mg total) by mouth 3 (three) times daily. 270 tablet 3   isosorbide mononitrate (IMDUR) 30 MG 24 hr tablet Take 1 tablet (30 mg total) by mouth daily. 90 tablet 3   nitroGLYCERIN (NITROSTAT) 0.4 MG SL tablet Place 1 tablet (0.4 mg total) under the tongue every 5 (five) minutes x 3 doses as needed for chest pain. 25 tablet 12   Olopatadine HCl (PATADAY OP) Place 1 drop into both eyes in the morning and at bedtime.     OXYGEN Inhale 2 L into the lungs as needed (shortness of breath).     potassium chloride SA (KLOR-CON M) 20 MEQ tablet Take 1 tablet by mouth once daily 90 tablet 3   No current facility-administered medications for this encounter.   BP (!) 160/80   Pulse 66   Wt 92.9 kg (204 lb  12.8 oz)   SpO2 94% Comment: 2l n/c  BMI 41.36 kg/m   Wt Readings from Last 3 Encounters:  09/01/21 92.9 kg (204 lb 12.8 oz)  08/14/21 92.1 kg (203 lb)  07/17/21 93.7 kg (206 lb 9.6 oz)    PHYSICAL EXAM: General:  NAD. No resp difficulty, arrived in [facility], short stature (Osteogenesis imperfecta), wearing 2L oxygen HEENT: normal left eyelid swollen Neck: supple. Thick hard to assess JVP Carotids 2+ bilat; no bruits. No lymphadenopathy or thryomegaly appreciated. Cor: PMI nondisplaced. Regular rate & rhythm. No rubs, gallops or murmurs. Lungs: clear Abdomen: obese soft, nontender, nondistended. No hepatosplenomegaly. No bruits or masses. Good bowel sounds. Extremities: no cyanosis, clubbing, rash, edema Neuro: alert & orientedx3, cranial nerves grossly intact. moves all 4 extremities w/o difficulty. Affect pleasant  ASSESSMENT & PLAN:  1. Chronic Systolic Heart Failure  - unable to fit in cMRI machine due to body stature.  PYP scan 8/18 no amyloid - Echo 09/2017: EF 25-30%, grade 2 DD - Echo 10/19: EF  30-35% - Echo 5/21 EF 35% - Echo 11/22 35-40% - Echo 3/23 EF 30-35%.   RV normal  - RHC 3/23 with mildly elevated filling pressures. High output  - Much improved NYHA II-III volume status looks good.  - Continue Lasix 80 mg bid with extra 40 mg PRN for 3 pound weight gain - No spiro or Entresto with CKD  - Continue Jardiance 10 mg daily.  - Continue hydralazine 100 mg tid + Imdur 60 mg daily. - Continue Coreg 12.5 mg bid. - Labs today  2. Hypertension  - moderately elevated here but well controlled at home - I have asked him to check his BP daily and get back to me with BP log.  - Continue Cardura 2 mg q hs. Increase as needed - other GDMT per above    3.  Mitral Regurgitation  - TEE 3/23 showed Mild P2 restriction with 2-3+ (mod-severe central and posterior MR). Findings were d/w structural heart team. Valve is amenable to clipping but not severe enough at this point.  - Plan to repeat echo at next visit  - HF optimization per above.  4. Chronic Respiratory Failure - In setting of chronic CHF. On home O2,  2L [place] - Has known OSA on CPAP.    5.  Osteogenesis imperfecta  - Wheel chair bound; limited mobility.  - No change.    6. OSA  - Wears O2 at night and PRN during the day.  - Had PSG on 11/07/17 with AHI 88/hr c/w very severe OSA.  - Follows with Dr. Radford Pax, reports CPAP compliance.   7. Morbid Obesity  - Body mass index is 41.36 kg/m.  8. Pulmonary hypertension - recent RHC c/w moderate mixed pulmonary HTN - Combination WHO Groups 2+ 3 - Continue diuretics, supp O2 and CPAP    9. Stage IV CKD  - SCr baseline ~3  - Renal US- no hydronephrosis  - On SGLT2i. - Labs today. - Has been referred to Nephrology  Glori Bickers, MD  5:29 PM

## 2021-09-01 NOTE — Patient Instructions (Addendum)
No changes to medications   Please record your blood pressure daily and send it into the office through my chart    Your physician recommends that you schedule a follow-up appointment in: 3 months  If you have any questions or concerns before your next appointment please send [place] a message through [place] or call our office at [phone].    TO LEAVE A MESSAGE FOR THE NURSE SELECT OPTION 2, PLEASE LEAVE A MESSAGE INCLUDING: YOUR NAME DATE OF BIRTH CALL BACK NUMBER REASON FOR CALL**this is important as we prioritize the call backs  YOU WILL RECEIVE A CALL BACK THE SAME DAY AS LONG AS YOU CALL BEFORE 4:00 PM  At the [facility], you and your health needs are our priority. As part of our continuing mission to provide you with exceptional heart care, we have created designated Provider Care Teams. These Care Teams include your primary Cardiologist (physician) and Advanced Practice Providers (APPs- Physician Assistants and Nurse Practitioners) who all work together to provide you with the care you need, when you need it.   You may see any of the following providers on your designated Care Team at your next follow up: Dr Glori Bickers Dr Haynes Kerns, NP Lyda Jester, [place] [facility] [place], [place] Audry Riles, PharmD   Please be sure to bring in all your medications bottles to every appointment.

## 2021-09-05 DIAGNOSIS — H16211 Exposure keratoconjunctivitis, right eye: Secondary | ICD-10-CM | POA: Diagnosis not present

## 2021-09-05 DIAGNOSIS — H0259 Other disorders affecting eyelid function: Secondary | ICD-10-CM | POA: Diagnosis not present

## 2021-09-05 DIAGNOSIS — I5022 Chronic systolic (congestive) heart failure: Secondary | ICD-10-CM | POA: Diagnosis not present

## 2021-09-17 DIAGNOSIS — I110 Hypertensive heart disease with heart failure: Secondary | ICD-10-CM | POA: Diagnosis not present

## 2021-09-17 DIAGNOSIS — I5042 Chronic combined systolic (congestive) and diastolic (congestive) heart failure: Secondary | ICD-10-CM | POA: Diagnosis not present

## 2021-09-17 DIAGNOSIS — R062 Wheezing: Secondary | ICD-10-CM | POA: Diagnosis not present

## 2021-09-17 DIAGNOSIS — G4733 Obstructive sleep apnea (adult) (pediatric): Secondary | ICD-10-CM | POA: Diagnosis not present

## 2021-09-22 ENCOUNTER — Encounter (HOSPITAL_COMMUNITY): Payer: Self-pay | Admitting: *Deleted

## 2021-10-01 DIAGNOSIS — G4733 Obstructive sleep apnea (adult) (pediatric): Secondary | ICD-10-CM | POA: Diagnosis not present

## 2021-10-20 ENCOUNTER — Other Ambulatory Visit (HOSPITAL_COMMUNITY): Payer: Self-pay | Admitting: Family Medicine

## 2021-10-21 ENCOUNTER — Other Ambulatory Visit (HOSPITAL_COMMUNITY): Payer: Self-pay

## 2021-10-21 MED ORDER — EMPAGLIFLOZIN 10 MG PO TABS: 10.0000 mg | ORAL_TABLET | Freq: Every day | ORAL | 2 refills | Status: DC

## 2021-10-31 ENCOUNTER — Ambulatory Visit: Payer: Self-pay

## 2021-10-31 NOTE — Patient Outreach (Signed)
  Care Coordination   10/31/2021 Name: Jaquarius Seder MRN: [id number] DOB: January 12, 1971   Care Coordination Outreach Attempts:  An unsuccessful telephone outreach was attempted today to offer the patient information about available care coordination services as a benefit of their health plan.   Follow Up Plan:  Additional outreach attempts will be made to offer the patient care coordination information and services.   Encounter Outcome:  No Answer  Care Coordination Interventions Activated:  No   Care Coordination Interventions:  No, not indicated      [place] Management [phone]

## 2021-11-10 ENCOUNTER — Ambulatory Visit: Payer: Self-pay

## 2021-11-10 NOTE — Patient Outreach (Signed)
  Care Coordination   11/10/2021 Name: Jerry Edwards MRN: [id number] DOB: 01/06/1971   Care Coordination Outreach Attempts: Contact was made with Jerry Edwards today. Appointment scheduled for November 14, 2021.   Follow Up Plan:  Appointment scheduled for November 14, 2021  Encounter Outcome:  Pt. Scheduled  Care Coordination Interventions Activated:  No   Care Coordination Interventions:  No, not indicated  Will address during scheduled outreach.    [place] Management [phone]

## 2021-11-14 ENCOUNTER — Ambulatory Visit: Payer: Self-pay

## 2021-11-14 NOTE — Patient Outreach (Signed)
  Care Coordination   Initial Visit Note   11/14/2021 Name: Jerry Edwards MRN: [id number] DOB: 29-Jul-1970  Jerry Edwards is a 51 y.o. year old male who sees [facility] for primary care. I spoke with  Rexene Agent by phone today.  What matters to the patients health and wellness today?  Health Maintenance    Goals Addressed             This Visit's Progress    Health Maintenance       Care Coordination Interventions: Reviewed plan for disease management. Reports doing very well with hypertension and CHF management. Reports completing daily weights and adhering to recommended fluid restrictions. Attending medical appointments as scheduled. Reviewed medications. Reports managing well. Denies concerns r/t medication management or prescription cost. Advised to contact the clinic to schedule an annual exam.         SDOH assessments and interventions completed:  Yes  SDOH Interventions Today    Flowsheet Row Most Recent Value  SDOH Interventions   Food Insecurity Interventions Intervention Not Indicated  Transportation Interventions Intervention Not Indicated        Care Coordination Interventions Activated:  Yes  Care Coordination Interventions:  Yes, provided   Follow up plan: Follow up call scheduled for March 19, 2022.    Encounter Outcome:  Pt. Visit Completed    [place] Management [phone]

## 2021-11-14 NOTE — Patient Instructions (Signed)
Visit Information Thank you for allowing the Care Management team to participate in your care. It was great speaking with you today! Please do not hesitate to call if you require assistance prior to our next outreach.  Following are the goals we discussed today:   Goals Addressed             This Visit's Progress    Health Maintenance       Care Coordination Interventions: Reviewed plan for disease management. Reports doing very well with hypertension and CHF management. Reports completing daily weights and adhering to recommended fluid restrictions. Attending medical appointments as scheduled. Reviewed medications. Reports managing well. Denies concerns r/t medication management or prescription cost. Advised to contact the clinic to schedule an annual exam.         Our next appointment is by telephone on March 19, 2022 at 1030. Please call the care guide team at [phone] if you need to cancel or reschedule your appointment.   Jerry Edwards verbalized understanding of the information discussed during the telephonic outreach today. Declined need for mailed instructions or resources.   A member of the care management team will follow up in December.   [place] [facility] [phone]

## 2021-11-26 DIAGNOSIS — G4733 Obstructive sleep apnea (adult) (pediatric): Secondary | ICD-10-CM | POA: Diagnosis not present

## 2021-11-26 DIAGNOSIS — I272 Pulmonary hypertension, unspecified: Secondary | ICD-10-CM | POA: Diagnosis not present

## 2021-11-26 DIAGNOSIS — I5022 Chronic systolic (congestive) heart failure: Secondary | ICD-10-CM | POA: Diagnosis not present

## 2021-11-26 DIAGNOSIS — I38 Endocarditis, valve unspecified: Secondary | ICD-10-CM | POA: Diagnosis not present

## 2021-11-26 DIAGNOSIS — N184 Chronic kidney disease, stage 4 (severe): Secondary | ICD-10-CM | POA: Diagnosis not present

## 2021-11-26 DIAGNOSIS — H0259 Other disorders affecting eyelid function: Secondary | ICD-10-CM | POA: Diagnosis not present

## 2021-11-26 DIAGNOSIS — H16211 Exposure keratoconjunctivitis, right eye: Secondary | ICD-10-CM | POA: Diagnosis not present

## 2021-11-26 DIAGNOSIS — I10 Essential (primary) hypertension: Secondary | ICD-10-CM | POA: Diagnosis not present

## 2021-12-10 ENCOUNTER — Telehealth (HOSPITAL_COMMUNITY): Payer: Self-pay

## 2021-12-10 NOTE — Progress Notes (Incomplete)
Patient ID: Jerry Edwards, male   DOB: Nov 01, 1970, 51 y.o.   MRN: [id number]   [facility] PROGRESS NOTE    Primary Physician: [place] Primary Cardiologist: [name] Cardiologist: [name]   HPI: Jerry Edwards is a 51 y.o. male with history of NICM, Chronic systolic heart failure, HTN, OSA, and osteogenesis imperfecta.   Admitted 1/15 with HF symptoms. ECHO 04/08/2013. EF 25%. cMRI 12/15 EF 38% possible infiltrative disease with discrete areas of non-coronary pattern delayed enhancement. Coronaries were normal on LHC.  SPEP no M spike and UPEP no free light chains.   Admitted 1/17 with recurrent HF, severe HTN and hypoxemia. Diuresed and discharged home.   Admitted 12/17 with acute respiratory failure and HTN crisis after stopping several meds.   Admitted 7/18 with atypical CP. Echo and Stress test as below.    PYP scan 8/18 with ratio of 1.2 (felt to be negative) - unable to have MRI (couldnt fit)  Admitted 6/19 with ADHF. Echo showed lower EF 25-30%. HF medications optimized. DC weight: 216 lbs  Sleep study 8/19 AHI 88/hr  Admitted 11/22 for CP, cards consulted and cardiac work up reassuring. Echo showed EF 35-40%. Hstrop 42-> 38  Se[facility] for psychotic episode. He became combative and left AMA.  Follow up 12/22 he was acutely manic, taken to ED for psych evaluation.  Seen in ED 2/23 for SOB, given Lasix 80 mg IV and instructed to increase Lasix to 80 bid x 3 days.  Admitted 3/23 for a/c CHF and AKI. [name] and [place] held. Diuresed w/ IV Lasix. Echo EF 30-35%. RV normal. New mitral regurgitation that appeared severe by echo.  RHC showed mild volume overload, moderate mixed pulmonary HTN, no evidence of significant v-waves in PCWP tracing, high cardiac output. TEE: Mild P2 restriction with 2-3+ (mod-severe central and posterior MR). Findings were d/w structural heart team. Valve is amenable to clipping but not severe enough  at this point. Plan to repeat echo in 6 months.  Last seen for f/u 06/23. Volume looked good. No changes to management.  Here today for 3 month f/u.    Cardiac studies:  - RHC (3/23):    RA = 9 RV = 58/14 PA = 61/30 (40) PCW = 21 (no significant v-waves) Fick cardiac output/index = 7.4/4.0 PVR = 2.6 WU FA sat = 92% PA sat = 69%, 72% SVC sat = 74%   Assessment: 1. Mild volume overload 2. Moderate mixed pulmonary HTN 3. No evidence of significant v-waves in PCWP tracing 4. High cardiac output   - TEE (3/23):  LEFT VENTRICLE: EF = 30-55%. Anterior and anteroapical AK RIGHT VENTRICLE: Mild HK LEFT ATRIUM: Mildly dilated LEFT ATRIAL APPENDAGE: No thrombus.  RIGHT ATRIUM: Normal AORTIC VALVE:  Trileaflet. Trivial AI MITRAL VALVE:    Mild P2 restriction with 2-3+ (mod-severe central and posterior MR). No significant reversal of flow in PVs TRICUSPID VALVE: Normal. Mild TR PULMONIC VALVE: Grossly normal. INTERATRIAL SEPTUM: Lipomatous hypertrophy. No PFO or ASD. PERICARDIUM: Trivial effusion DESCENDING AORTA: Not well seen   Echo 3/22 EF 30%, severe MR, Normal RV Echo 11/22 EF 35-40% Echo 10/19 EF 30-35% Echo 09/20/17: EF 25-30%, grade 2 DD Echo 10/13/16 LVEF 35-40%, Grade 1 DD, Mild MR, Mild LAE Echo 1/17 30-35% RV ok  Echo 02/22/16 LVEF 40%, Grade 1 DD.    - Myoview 10/15/16 No change in EKG from baseline T wave inversions in the inferolateral leads There is a medium  defect of mild severity present in the basal inferior, mid inferior and apical inferior location. The defect is non-reversible and consistent with prior infarct. There is a small defect of moderate severity present in the apex location. The defect is partially reversible and consistent with infarct with possible small area of peri infarct ischemia. This is a high risk study. EF not calculated but visually appears to be moderately to severely reduced.   LHC/RHC 04/08/13  Normal coronaries RA pressure:   39/39 with a mean of 34. Prominent X. and Y. Descends.  RV pressure: 76/26  mean 4 PA pressure: 70/36 with a mean of 51  Pulmonary capillary wedge pressure: 31/30 and mean of 29  LV pressure: 140/26 with a left ventricular end-diastolic pressure of 35  PA sat 66%  AO sat 97%  CO/CI 4.94/2.63  Pulmonary vascular resistance: 4.45 [place] units   PMH: 1. HTN 2. Osteogenesis imperfecta: wheelchair-bound.  History of bone fractures.  3. Suspect OSA 4. CKD 5. Nonischemic cardiomyopathy: Echo (1/15) with EF 20-25%, severe diffuse hypokinesis, mild MR, mildly enlarged RV with moderately decreased systolic function.  LHC/RHC (1/15) with normal coronaries, mean RA 34, PA 70/36 mean 51, mean PCWP 29, CI 2.63.  Cardiac MRI (1/15) with EF 38%, global hypokinesis, discrete areas of delayed enhancement in the subepicardial mid inferoseptal RV insertion site, the mid wall of the mid inferolateral wall, and the subendocardial mid anterolateral wall.  This pattern was suggestive of infiltrative disease.  UPEP/SPEP negative, HIV negative.  Chest CT to look for evidence of sarcoidosis did not show any sarcoid-type lesions.    SH:  Single, prior smoking now quit, occasional marijuana, works as Dispensing optician.   FH:  HTN, no significant cardiac disease.   Current Outpatient Medications  Medication Sig Dispense Refill   aspirin 81 MG tablet Take 1 tablet (81 mg total) by mouth daily.     carvedilol (COREG) 12.5 MG tablet Take 1 tablet (12.5 mg total) by mouth 2 (two) times daily with a meal. 60 tablet 6   doxazosin (CARDURA) 2 MG tablet Take 1 tablet (2 mg total) by mouth at bedtime. 60 tablet 3   empagliflozin (JARDIANCE) 10 MG TABS tablet Take 1 tablet (10 mg total) by mouth daily before breakfast. 90 tablet 2   erythromycin ophthalmic ointment Place 1 application into the right eye at bedtime.     furosemide (LASIX) 80 MG tablet Take 80 mg twice a day. Can take an extra 40 mg for 3 pound weight gain 75 tablet 6    hydrALAZINE (APRESOLINE) 100 MG tablet Take 1 tablet (100 mg total) by mouth 3 (three) times daily. 270 tablet 3   isosorbide mononitrate (IMDUR) 30 MG 24 hr tablet Take 1 tablet (30 mg total) by mouth daily. 90 tablet 3   nitroGLYCERIN (NITROSTAT) 0.4 MG SL tablet Place 1 tablet (0.4 mg total) under the tongue every 5 (five) minutes x 3 doses as needed for chest pain. 25 tablet 12   Olopatadine HCl (PATADAY OP) Place 1 drop into both eyes in the morning and at bedtime.     OXYGEN Inhale 2 L into the lungs as needed (shortness of breath).     potassium chloride SA (KLOR-CON M) 20 MEQ tablet Take 1 tablet by mouth once daily 90 tablet 3   No current facility-administered medications for this visit.   There were no vitals taken for this visit.  Wt Readings from Last 3 Encounters:  09/01/21 92.9 kg (204 lb 12.8 oz)  08/14/21 92.1 kg (203 lb)  07/17/21 93.7 kg (206 lb 9.6 oz)    PHYSICAL EXAM: General:  NAD. No resp difficulty, arrived in [facility], short stature (Osteogenesis imperfecta), wearing 2L oxygen HEENT: normal left eyelid swollen Neck: supple. Thick hard to assess JVP Carotids 2+ bilat; no bruits. No lymphadenopathy or thryomegaly appreciated. Cor: PMI nondisplaced. Regular rate & rhythm. No rubs, gallops or murmurs. Lungs: clear Abdomen: obese soft, nontender, nondistended. No hepatosplenomegaly. No bruits or masses. Good bowel sounds. Extremities: no cyanosis, clubbing, rash, edema Neuro: alert & orientedx3, cranial nerves grossly intact. moves all 4 extremities w/o difficulty. Affect pleasant  ASSESSMENT & PLAN:  1. Chronic Systolic Heart Failure  - unable to fit in cMRI machine due to body stature.  PYP scan 8/18 no amyloid - Echo 09/2017: EF 25-30%, grade 2 DD - Echo 10/19: EF  30-35% - Echo 5/21 EF 35% - Echo 11/22 35-40% - Echo 3/23 EF 30-35%.  RV normal  - RHC 3/23 with mildly elevated filling pressures. High output  - Much improved NYHA II-III volume status looks good.  -  Continue Lasix 80 mg bid with extra 40 mg PRN for 3 pound weight gain - No spiro or Entresto with CKD  - Continue Jardiance 10 mg daily.  - Continue hydralazine 100 mg tid + Imdur 60 mg daily. - Continue Coreg 12.5 mg bid. - Labs today  2. Hypertension  - *** - Continue Cardura 2 mg q hs. Increase as needed - other GDMT per above    3.  Mitral Regurgitation  - TEE 3/23 showed Mild P2 restriction with 2-3+ (mod-severe central and posterior MR). Findings were d/w structural heart team. Valve is amenable to clipping but not severe enough at this point.  - Due for repeat echo *** - HF optimization per above.  4. Chronic Respiratory Failure - In setting of chronic CHF. On home O2,  2L [place] - Has known OSA on CPAP.    5.  Osteogenesis imperfecta  - Wheel chair bound; limited mobility.  - No change.    6. OSA  - Wears O2 at night and PRN during the day.  - Had PSG on 11/07/17 with AHI 88/hr c/w very severe OSA.  - Follows with Dr. Radford Pax, reports CPAP compliance.   7. Morbid Obesity  - There is no height or weight on file to calculate BMI.  8. Pulmonary hypertension - recent RHC c/w moderate mixed pulmonary HTN - Combination WHO Groups 2+ 3 - Continue diuretics, supp O2 and CPAP    9. Stage IV CKD  - SCr baseline ~3  - Renal US- no hydronephrosis  - On SGLT2i. - Labs today. - Has been referred to Nephrology  F/u: ***  Tyah Acord N, PA-C  5:00 PM

## 2021-12-10 NOTE — Telephone Encounter (Signed)
Called to confirm/remind patient of their appointment at the [facility] on 12/11/21.   Patient reminded to bring all medications and/or complete list.  Confirmed patient has transportation. Gave directions, instructed to utilize [place] parking.  Confirmed appointment prior to ending call.

## 2021-12-11 ENCOUNTER — Telehealth (HOSPITAL_COMMUNITY): Payer: Self-pay | Admitting: Cardiology

## 2021-12-11 ENCOUNTER — Ambulatory Visit (HOSPITAL_COMMUNITY)
Admission: RE | Admit: 2021-12-11 | Discharge: 2021-12-11 | Disposition: A | Discharge status: Home / Self Care | Payer: Medicare Other | Source: Ambulatory Visit | Attending: Physician Assistant | Admitting: Physician Assistant

## 2021-12-11 ENCOUNTER — Telehealth (HOSPITAL_COMMUNITY): Payer: Self-pay

## 2021-12-11 ENCOUNTER — Telehealth (HOSPITAL_COMMUNITY): Payer: Self-pay | Admitting: *Deleted

## 2021-12-11 VITALS — BP 145/81 | HR 65 | Wt 206.0 lb

## 2021-12-11 DIAGNOSIS — I10 Essential (primary) hypertension: Secondary | ICD-10-CM

## 2021-12-11 DIAGNOSIS — N184 Chronic kidney disease, stage 4 (severe): Secondary | ICD-10-CM | POA: Diagnosis not present

## 2021-12-11 DIAGNOSIS — J9610 Chronic respiratory failure, unspecified whether with hypoxia or hypercapnia: Secondary | ICD-10-CM | POA: Diagnosis not present

## 2021-12-11 DIAGNOSIS — Z01810 Encounter for preprocedural cardiovascular examination: Secondary | ICD-10-CM | POA: Insufficient documentation

## 2021-12-11 DIAGNOSIS — I130 Hypertensive heart and chronic kidney disease with heart failure and stage 1 through stage 4 chronic kidney disease, or unspecified chronic kidney disease: Secondary | ICD-10-CM | POA: Diagnosis not present

## 2021-12-11 DIAGNOSIS — I5022 Chronic systolic (congestive) heart failure: Secondary | ICD-10-CM | POA: Insufficient documentation

## 2021-12-11 DIAGNOSIS — I428 Other cardiomyopathies: Secondary | ICD-10-CM | POA: Insufficient documentation

## 2021-12-11 DIAGNOSIS — G4733 Obstructive sleep apnea (adult) (pediatric): Secondary | ICD-10-CM | POA: Diagnosis not present

## 2021-12-11 DIAGNOSIS — I272 Pulmonary hypertension, unspecified: Secondary | ICD-10-CM | POA: Insufficient documentation

## 2021-12-11 DIAGNOSIS — Q780 Osteogenesis imperfecta: Secondary | ICD-10-CM | POA: Insufficient documentation

## 2021-12-11 DIAGNOSIS — Z6841 Body Mass Index (BMI) 40.0 and over, adult: Secondary | ICD-10-CM | POA: Insufficient documentation

## 2021-12-11 DIAGNOSIS — I340 Nonrheumatic mitral (valve) insufficiency: Secondary | ICD-10-CM | POA: Insufficient documentation

## 2021-12-11 DIAGNOSIS — Z993 Dependence on wheelchair: Secondary | ICD-10-CM | POA: Insufficient documentation

## 2021-12-11 DIAGNOSIS — Z79899 Other long term (current) drug therapy: Secondary | ICD-10-CM | POA: Diagnosis not present

## 2021-12-11 DIAGNOSIS — Z7984 Long term (current) use of oral hypoglycemic drugs: Secondary | ICD-10-CM | POA: Diagnosis not present

## 2021-12-11 LAB — BRAIN NATRIURETIC PEPTIDE: B Natriuretic Peptide: 176.8 pg/mL — ABNORMAL HIGH (ref 0.0–100.0)

## 2021-12-11 LAB — BASIC METABOLIC PANEL
Anion gap: 7 (ref 5–15)
BUN: 37 mg/dL — ABNORMAL HIGH (ref 6–20)
CO2: 27 mmol/L (ref 22–32)
Calcium: 8.8 mg/dL — ABNORMAL LOW (ref 8.9–10.3)
Chloride: 107 mmol/L (ref 98–111)
Creatinine, Ser: 3.19 mg/dL — ABNORMAL HIGH (ref 0.61–1.24)
GFR, Estimated: 23 mL/min — ABNORMAL LOW (ref >60)
Glucose, Bld: 90 mg/dL (ref 70–99)
Potassium: 3.2 mmol/L — ABNORMAL LOW (ref 3.5–5.1)
Sodium: 141 mmol/L (ref 135–145)

## 2021-12-11 MED ORDER — POTASSIUM CHLORIDE CRYS ER 20 MEQ PO TBCR: 40.0000 meq | EXTENDED_RELEASE_TABLET | Freq: Every day | ORAL | 3 refills | Status: DC

## 2021-12-11 NOTE — Telephone Encounter (Signed)
error 

## 2021-12-11 NOTE — Patient Instructions (Signed)
There has been no changes to your medications  Labs done today, your results will be available in MyChart, we will contact you for abnormal readings.  Your physician has requested that you have an echocardiogram. Echocardiography is a painless test that uses sound waves to create images of your heart. It provides your doctor with information about the size and shape of your heart and how well your heart's chambers and valves are working. This procedure takes approximately one hour. There are no restrictions for this procedure.  Your physician recommends that you schedule a follow-up appointment in: 6 months ( March 2024)  ** please call the office in January to arrange your follow up appointment **  If you have any questions or concerns before your next appointment please send [place] a message through [place] or call our office at [phone].    TO LEAVE A MESSAGE FOR THE NURSE SELECT OPTION 2, PLEASE LEAVE A MESSAGE INCLUDING: YOUR NAME DATE OF BIRTH CALL BACK NUMBER REASON FOR CALL**this is important as we prioritize the call backs  YOU WILL RECEIVE A CALL BACK THE SAME DAY AS LONG AS YOU CALL BEFORE 4:00 PM  At the [facility], you and your health needs are our priority. As part of our continuing mission to provide you with exceptional heart care, we have created designated Provider Care Teams. These Care Teams include your primary Cardiologist (physician) and Advanced Practice Providers (APPs- Physician Assistants and Nurse Practitioners) who all work together to provide you with the care you need, when you need it.   You may see any of the following providers on your designated Care Team at your next follow up: Dr Glori Bickers Dr Loralie Champagne Dr. Roxana Hires, NP Lyda Jester, [place] [facility] [place], [place] Forestine Na, NP Audry Riles, PharmD   Please be sure to bring in all your medications bottles to every appointment.

## 2021-12-11 NOTE — Telephone Encounter (Signed)
Patient called.  Patient aware. Repeat labs 9/26

## 2021-12-11 NOTE — Telephone Encounter (Signed)
Pt needs clearnce for Orbitotomy on 10/6 under general anesthesia  Form completed by Dr Haroldine Laws: pt is NOT cleared, he is "high risk for general anesthesia. Will require pulmonary eval pre-op as well. We cannot clear at this time"  This was discussed with pt at [place] today (9/21) and form faxed to [facility] at [phone]

## 2021-12-11 NOTE — Addendum Note (Signed)
Encounter addended by: Joette Catching, PA-C on: 12/11/2021 1:22 PM  Actions taken: Clinical Note Signed

## 2021-12-11 NOTE — Telephone Encounter (Signed)
-----   Message from Joette Catching, [place] sent at 12/11/2021  2:48 PM EDT ----- Labs stable other than K low. Ensure he is taking 20 mEq k daily, if he has been taking increase to 40 mEq daily. BMET in 1 week

## 2021-12-11 NOTE — Addendum Note (Signed)
Encounter addended by: Joette Catching, PA-C on: 12/11/2021 2:53 PM  Actions taken: Order Reconciliation Section accessed, Diagnosis association updated

## 2021-12-16 ENCOUNTER — Ambulatory Visit (HOSPITAL_COMMUNITY)
Admission: RE | Admit: 2021-12-16 | Discharge: 2021-12-16 | Disposition: A | Discharge status: Home / Self Care | Payer: Medicare Other | Source: Ambulatory Visit

## 2021-12-16 ENCOUNTER — Ambulatory Visit (HOSPITAL_COMMUNITY)
Admission: RE | Admit: 2021-12-16 | Discharge: 2021-12-16 | Disposition: A | Discharge status: Home / Self Care | Payer: Medicare Other | Source: Ambulatory Visit | Attending: Internal Medicine | Admitting: Internal Medicine

## 2021-12-16 DIAGNOSIS — I340 Nonrheumatic mitral (valve) insufficiency: Secondary | ICD-10-CM | POA: Diagnosis not present

## 2021-12-16 DIAGNOSIS — I5022 Chronic systolic (congestive) heart failure: Secondary | ICD-10-CM | POA: Insufficient documentation

## 2021-12-16 DIAGNOSIS — Z87891 Personal history of nicotine dependence: Secondary | ICD-10-CM | POA: Insufficient documentation

## 2021-12-16 DIAGNOSIS — I428 Other cardiomyopathies: Secondary | ICD-10-CM | POA: Diagnosis not present

## 2021-12-16 DIAGNOSIS — I110 Hypertensive heart disease with heart failure: Secondary | ICD-10-CM | POA: Insufficient documentation

## 2021-12-16 DIAGNOSIS — Q780 Osteogenesis imperfecta: Secondary | ICD-10-CM | POA: Diagnosis not present

## 2021-12-16 DIAGNOSIS — G4730 Sleep apnea, unspecified: Secondary | ICD-10-CM | POA: Insufficient documentation

## 2021-12-16 DIAGNOSIS — I272 Pulmonary hypertension, unspecified: Secondary | ICD-10-CM | POA: Insufficient documentation

## 2021-12-16 LAB — ECHOCARDIOGRAM COMPLETE
Area-P 1/2: 3.07 cm2
MV M vel: 5.28 m/s
MV Peak grad: 111.5 mmHg
S' Lateral: 3.9 cm

## 2021-12-16 LAB — BASIC METABOLIC PANEL
Anion gap: 14 (ref 5–15)
BUN: 49 mg/dL — ABNORMAL HIGH (ref 6–20)
CO2: 22 mmol/L (ref 22–32)
Calcium: 8.6 mg/dL — ABNORMAL LOW (ref 8.9–10.3)
Chloride: 107 mmol/L (ref 98–111)
Creatinine, Ser: 3.2 mg/dL — ABNORMAL HIGH (ref 0.61–1.24)
GFR, Estimated: 23 mL/min — ABNORMAL LOW (ref >60)
Glucose, Bld: 115 mg/dL — ABNORMAL HIGH (ref 70–99)
Potassium: 3.5 mmol/L (ref 3.5–5.1)
Sodium: 143 mmol/L (ref 135–145)

## 2021-12-16 MED ORDER — PERFLUTREN LIPID MICROSPHERE
1.0000 mL | INTRAVENOUS | Status: AC | PRN
  Administered 2021-12-16: 2 mL via INTRAVENOUS
  Filled 2021-12-16: qty 10

## 2021-12-30 DIAGNOSIS — G4733 Obstructive sleep apnea (adult) (pediatric): Secondary | ICD-10-CM | POA: Diagnosis not present

## 2022-02-02 ENCOUNTER — Emergency Department (HOSPITAL_COMMUNITY): Payer: Medicare Other

## 2022-02-02 ENCOUNTER — Emergency Department (HOSPITAL_COMMUNITY)
Admission: EM | Admit: 2022-02-02 | Discharge: 2022-02-03 | Disposition: A | Discharge status: Home / Self Care | Payer: Medicare Other | Attending: Emergency Medicine | Admitting: Emergency Medicine

## 2022-02-02 ENCOUNTER — Other Ambulatory Visit: Payer: Self-pay

## 2022-02-02 DIAGNOSIS — N5082 Scrotal pain: Secondary | ICD-10-CM | POA: Diagnosis present

## 2022-02-02 DIAGNOSIS — R600 Localized edema: Secondary | ICD-10-CM | POA: Diagnosis not present

## 2022-02-02 DIAGNOSIS — Z91040 Latex allergy status: Secondary | ICD-10-CM | POA: Diagnosis not present

## 2022-02-02 DIAGNOSIS — N433 Hydrocele, unspecified: Secondary | ICD-10-CM | POA: Insufficient documentation

## 2022-02-02 DIAGNOSIS — I130 Hypertensive heart and chronic kidney disease with heart failure and stage 1 through stage 4 chronic kidney disease, or unspecified chronic kidney disease: Secondary | ICD-10-CM | POA: Diagnosis not present

## 2022-02-02 DIAGNOSIS — I5020 Unspecified systolic (congestive) heart failure: Secondary | ICD-10-CM | POA: Diagnosis not present

## 2022-02-02 DIAGNOSIS — Z91010 Allergy to peanuts: Secondary | ICD-10-CM | POA: Diagnosis not present

## 2022-02-02 DIAGNOSIS — N189 Chronic kidney disease, unspecified: Secondary | ICD-10-CM | POA: Insufficient documentation

## 2022-02-02 DIAGNOSIS — Z7984 Long term (current) use of oral hypoglycemic drugs: Secondary | ICD-10-CM | POA: Insufficient documentation

## 2022-02-02 DIAGNOSIS — Z7982 Long term (current) use of aspirin: Secondary | ICD-10-CM | POA: Diagnosis not present

## 2022-02-02 DIAGNOSIS — Z79899 Other long term (current) drug therapy: Secondary | ICD-10-CM | POA: Insufficient documentation

## 2022-02-02 DIAGNOSIS — N5089 Other specified disorders of the male genital organs: Secondary | ICD-10-CM

## 2022-02-02 LAB — BASIC METABOLIC PANEL
Anion gap: 7 (ref 5–15)
BUN: 38 mg/dL — ABNORMAL HIGH (ref 6–20)
CO2: 31 mmol/L (ref 22–32)
Calcium: 8.7 mg/dL — ABNORMAL LOW (ref 8.9–10.3)
Chloride: 103 mmol/L (ref 98–111)
Creatinine, Ser: 3.04 mg/dL — ABNORMAL HIGH (ref 0.61–1.24)
GFR, Estimated: 24 mL/min — ABNORMAL LOW (ref >60)
Glucose, Bld: 85 mg/dL (ref 70–99)
Potassium: 4.2 mmol/L (ref 3.5–5.1)
Sodium: 141 mmol/L (ref 135–145)

## 2022-02-02 LAB — CBC WITH DIFFERENTIAL/PLATELET
Abs Immature Granulocytes: 0.01 10*3/uL (ref 0.00–0.07)
Basophils Absolute: 0 10*3/uL (ref 0.0–0.1)
Basophils Relative: 0 %
Eosinophils Absolute: 0.1 10*3/uL (ref 0.0–0.5)
Eosinophils Relative: 1 %
HCT: 42.2 % (ref 39.0–52.0)
Hemoglobin: 12.5 g/dL — ABNORMAL LOW (ref 13.0–17.0)
Immature Granulocytes: 0 %
Lymphocytes Relative: 20 %
Lymphs Abs: 1.4 10*3/uL (ref 0.7–4.0)
MCH: 28.3 pg (ref 26.0–34.0)
MCHC: 29.6 g/dL — ABNORMAL LOW (ref 30.0–36.0)
MCV: 95.7 fL (ref 80.0–100.0)
Monocytes Absolute: 1.3 10*3/uL — ABNORMAL HIGH (ref 0.1–1.0)
Monocytes Relative: 19 %
Neutro Abs: 4.1 10*3/uL (ref 1.7–7.7)
Neutrophils Relative %: 60 %
Platelets: 227 10*3/uL (ref 150–400)
RBC: 4.41 MIL/uL (ref 4.22–5.81)
RDW: 12.2 % (ref 11.5–15.5)
WBC: 6.9 10*3/uL (ref 4.0–10.5)
nRBC: 0 % (ref 0.0–0.2)

## 2022-02-02 LAB — URINALYSIS, ROUTINE W REFLEX MICROSCOPIC
Bacteria, UA: NONE SEEN
Bilirubin Urine: NEGATIVE
Glucose, UA: 150 mg/dL — AB
Hgb urine dipstick: NEGATIVE
Ketones, ur: NEGATIVE mg/dL
Leukocytes,Ua: NEGATIVE
Nitrite: NEGATIVE
Protein, ur: 100 mg/dL — AB
Specific Gravity, Urine: 1.006 (ref 1.005–1.030)
pH: 7 (ref 5.0–8.0)

## 2022-02-02 NOTE — ED Provider Triage Note (Signed)
Emergency Medicine Provider Triage Evaluation Note  Jerry Edwards , a 51 y.o. male  was evaluated in triage.  Pt complains of groin swelling. Began 3 days ago. Associated pain BIL. Hx of CHF however no hx of similar. No difficulty with urination. No hx of known hernia.  Review of Systems  Positive: Groin swelling Negative: Fever, dysuria, hematuria  Physical Exam  BP (!) 140/71   Pulse 88   Temp 98.3 F (36.8 C)   Resp 16   SpO2 92%  Gen:   Awake, no distress   Resp:  Normal effort  MSK:   Moves extremities without difficulty  Other:    Medical Decision Making  Medically screening exam initiated at 6:20 PM.  Appropriate orders placed.  Jerry Edwards was informed that the remainder of the evaluation will be completed by another provider, this initial triage assessment does not replace that evaluation, and the importance of remaining in the ED until their evaluation is complete.  Scrotal pain, swelling   Jerry Edwards A, PA-C 02/02/22 1822

## 2022-02-02 NOTE — ED Triage Notes (Signed)
Pt reports scrotal swelling, L>R, since Saturday. Pain intermittent and sharp.

## 2022-02-03 ENCOUNTER — Emergency Department (HOSPITAL_COMMUNITY): Payer: Medicare Other

## 2022-02-03 DIAGNOSIS — N5082 Scrotal pain: Secondary | ICD-10-CM | POA: Diagnosis not present

## 2022-02-03 MED ORDER — OXYCODONE-ACETAMINOPHEN 5-325 MG PO TABS: 1.0000 | ORAL_TABLET | Freq: Four times a day (QID) | ORAL | 0 refills | Status: DC | PRN

## 2022-02-03 MED ORDER — OXYCODONE-ACETAMINOPHEN 5-325 MG PO TABS
1.0000 | ORAL_TABLET | Freq: Once | ORAL | Status: AC
  Administered 2022-02-03: 1 via ORAL
  Filled 2022-02-03: qty 1

## 2022-02-03 NOTE — ED Provider Notes (Addendum)
[facility] EMERGENCY DEPARTMENT Provider Note   CSN: [id number] Arrival date & time: 02/02/22  1658     History  No chief complaint on file.   Jerry Edwards is a 51 y.o. male.  With PMH of HFrEF, HTN, CKD who presents with scrotal swelling and pain over the past 3 days.  He said swelling developed Saturday and has progressively worsened.  He feels like it is worse on the left side more than the right side.  He has had no recent injuries and denies any heavy lifting prior to this starting.  He has had no fevers, no penile discharge, no pain with urination or difficulties urinating.  This is never happened before.  Pain is typically intermittent and sharp.  He is compliant with his Lasix and denies missing any doses or increasing swelling in lower extremities or elsewhere.  He is not sexually active and has no concern for STD.  HPI     Home Medications Prior to Admission medications   Medication Sig Start Date End Date Taking? Authorizing Provider  oxyCODONE-acetaminophen (PERCOCET/ROXICET) 5-325 MG tablet Take 1 tablet by mouth every 6 (six) hours as needed for up to 10 doses for severe pain. 02/03/22  Yes Elgie Congo, MD  aspirin 81 MG tablet Take 1 tablet (81 mg total) by mouth daily. 04/21/13   Barton Dubois, MD  carvedilol (COREG) 12.5 MG tablet Take 1 tablet (12.5 mg total) by mouth 2 (two) times daily with a meal. 06/19/21   Lyda Jester M, PA-C  doxazosin (CARDURA) 2 MG tablet Take 1 tablet (2 mg total) by mouth at bedtime. 08/14/21   [place], Maricela Bo, FNP  empagliflozin (JARDIANCE) 10 MG TABS tablet Take 1 tablet (10 mg total) by mouth daily before breakfast. 10/21/21   [place], Maricela Bo, FNP  erythromycin ophthalmic ointment Place 1 application into the right eye at bedtime. 05/01/21   [provider]  furosemide (LASIX) 80 MG tablet Take 80 mg twice a day. Can take an extra 40 mg for 3 pound weight gain 06/06/21   Domenic Polite, MD   hydrALAZINE (APRESOLINE) 100 MG tablet Take 1 tablet (100 mg total) by mouth 3 (three) times daily. 07/17/21   Bensimhon, Shaune Pascal, MD  isosorbide mononitrate (IMDUR) 30 MG 24 hr tablet Take 1 tablet (30 mg total) by mouth daily. 02/09/21   Bhagat, Crista Luria, PA  nitroGLYCERIN (NITROSTAT) 0.4 MG SL tablet Place 1 tablet (0.4 mg total) under the tongue every 5 (five) minutes x 3 doses as needed for chest pain. 02/09/21   Bhagat, Crista Luria, PA  Olopatadine HCl (PATADAY OP) Place 1 drop into both eyes in the morning and at bedtime.    [provider]  OXYGEN Inhale 2 L into the lungs as needed (shortness of breath).    [provider]  potassium chloride SA (KLOR-CON M) 20 MEQ tablet Take 2 tablets (40 mEq total) by mouth daily. 12/11/21   Joette Catching, PA-C      Allergies    Fish allergy, Other, Peanuts [peanut oil], Shellfish allergy, Latex, and Pork-derived products    Review of Systems   Review of Systems  Physical Exam Updated Vital Signs BP (!) 134/92 (BP Location: Left Arm)   Pulse 70   Temp 98.1 F (36.7 C)   Resp 17   SpO2 94%  Physical Exam Constitutional: Alert and oriented.  Slightly uncomfortable but no acute distress Eyes: Conjunctivae are normal. Cardiovascular: S1, S2,  Normal and symmetric  distal pulses are present in all extremities.Warm and well perfused. Respiratory: Normal respiratory effort. Breath sounds are normal. Gastrointestinal: Soft and nontender.  GU: Performed with bedside paramedic [name] as chaperone.  There is diffuse scrotal swelling that is more present on the left side with no external skin changes, no warmth, no erythema or breakdown.  Testes are palpable and nontender.  There is no discharge from the penis.  There are no external lesions. Musculoskeletal: No pitting edema of lower extremities Neurologic: Normal speech and language.  Skin: Warm dry Psychiatric: Mood and affect are normal. Speech and behavior are  normal.  ED Results / Procedures / Treatments   Labs (all labs ordered are listed, but only abnormal results are displayed) Labs Reviewed  CBC WITH DIFFERENTIAL/PLATELET - Abnormal; Notable for the following components:      Result Value   Hemoglobin 12.5 (*)    MCHC 29.6 (*)    Monocytes Absolute 1.3 (*)    All other components within normal limits  BASIC METABOLIC PANEL - Abnormal; Notable for the following components:   BUN 38 (*)    Creatinine, Ser 3.04 (*)    Calcium 8.7 (*)    GFR, Estimated 24 (*)    All other components within normal limits  URINALYSIS, ROUTINE W REFLEX MICROSCOPIC - Abnormal; Notable for the following components:   Color, Urine STRAW (*)    Glucose, UA 150 (*)    Protein, ur 100 (*)    All other components within normal limits  URINE CYTOLOGY ANCILLARY ONLY    EKG None  Radiology CT PELVIS WO CONTRAST  Result Date: 02/03/2022 CLINICAL DATA:  evaluate possible inguinal hernia on right EXAM: CT PELVIS WITHOUT CONTRAST TECHNIQUE: Multidetector CT imaging of the pelvis was performed following the standard protocol without intravenous contrast. RADIATION DOSE REDUCTION: This exam was performed according to the departmental dose-optimization program which includes automated exposure control, adjustment of the mA and/or kV according to patient size and/or use of iterative reconstruction technique. COMPARISON:  None Available. FINDINGS: Urinary Tract:  No abnormality visualized. Bowel:  Unremarkable visualized pelvic bowel loops. Vascular/Lymphatic: No pathologically enlarged lymph nodes. No significant vascular abnormality seen. Reproductive: Enlargement of the left scrotum could reflect hydrocele. Other: No free fluid or free air. No visible inguinal or ventral wall hernia. Musculoskeletal: No acute bony abnormality. IMPRESSION: No inguinal or ventral wall hernia. Enlargement of the left scrotum, favor hydrocele. This could be further evaluated with testicular  ultrasound if felt clinically indicated. Electronically Signed   By: Rolm Baptise M.D.   On: 02/03/2022 10:58   US SCROTUM W/DOPPLER  Result Date: 02/02/2022 CLINICAL DATA:  Testicular pain, left greater than right, edema x3 days EXAM: SCROTAL ULTRASOUND DOPPLER ULTRASOUND OF THE TESTICLES TECHNIQUE: Complete ultrasound examination of the testicles, epididymis, and other scrotal structures was performed. Color and spectral Doppler ultrasound were also utilized to evaluate blood flow to the testicles. COMPARISON:  None Available. FINDINGS: Right testicle Measurements: 4.3 x 1.9 x 2.4 cm. No mass or microlithiasis visualized. Left testicle Measurements: 4.9 x 3.2 x 3.0 cm. No mass or microlithiasis visualized. Right epididymis:  4 x 3 x 4 mm epididymal cyst. Left epididymis:  Not visualized. Hydrocele: Small right hydrocele. 8.8 x 7.4 x 7.9 cm complex fluid collection in the left scrotum, large complex hydrocele versus bowel containing hernia. Varicocele:  None visualized. Pulsed Doppler interrogation of both testes demonstrates normal low resistance arterial and venous waveforms bilaterally. IMPRESSION: No evidence of testicular torsion. 8.8 x 7.4  x 7.9 cm complex fluid collection in the left scrotum, large complex hydrocele versus bowel containing hernia. Consider CT pelvis for further evaluation. Electronically Signed   By: Julian Hy M.D.   On: 02/02/2022 20:47    Procedures Procedures    Medications Ordered in ED Medications  oxyCODONE-acetaminophen (PERCOCET/ROXICET) 5-325 MG per tablet 1 tablet (1 tablet Oral Given 02/03/22 1117)    ED Course/ Medical Decision Making/ A&P                           Medical Decision Making  Jerry Edwards is a 51 y.o. male.  With PMH of HFrEF, HTN, CKD who presents with scrotal swelling and pain over the past 3 days.    Based on patient's presentation, consider hydrocele, varicocele or possible hernia.  CT scan of the pelvis and ultrasound showed  evidence of large complex fluid collection within the left scrotum.  Favoring hydrocele. No evidence of strangulation or incarceration if inguinal hernia although no hernia seen on Ct pelvis. He is afebrile with no skin changes and has a normal white blood cell count 6.9. Additionally, not sexually active, no concerns for epidydimitis based off exam and [place]. Low suspicion for associated infection.  Based off story and exam, low suspicion for torsion additionally no evidence of torsion on ultrasound.   Advised continued supportive care, prescribed as needed Percocet for pain control and referral to urology made for further evaluation and work-up.  Strict return precautions discussed.  Safe for discharge at this time.  Amount and/or Complexity of Data Reviewed Radiology: ordered.  Risk Prescription drug management.      Final Clinical Impression(s) / ED Diagnoses Final diagnoses:  Scrotal swelling  Scrotal pain  Hydrocele, unspecified hydrocele type    Rx / DC Orders ED Discharge Orders          Ordered    oxyCODONE-acetaminophen (PERCOCET/ROXICET) 5-325 MG tablet  Every 6 hours PRN        02/03/22 1134    Ambulatory referral to Urology        02/03/22 1134              Elgie Congo, MD 02/03/22 1142    Elgie Congo, MD 02/03/22 1143

## 2022-02-03 NOTE — ED Notes (Signed)
Patient stated he needed to wait for his mom to get off work to come get him. Patient will wait for his mom in the lobby.

## 2022-02-03 NOTE — Discharge Instructions (Addendum)
You were seen for scrotal swelling that showed evidence of fluid surrounding your testicles and scrotal region called a hydrocele.  Make an appointment with the urologist to be seen regarding this finding and further management.  You can continue to wear supportive underwear and take Tylenol and ibuprofen as needed for pain.  If pain is still severe you can take Percocet as needed but do not take while drinking alcohol or driving cars.  If pain becomes severe and uncontrolled, you develop high fevers or skin changes, or any other symptoms concerning to you such as vomiting uncontrollably with pain, please come back to the ER to be evaluated.

## 2022-02-04 ENCOUNTER — Telehealth (HOSPITAL_COMMUNITY): Payer: Self-pay | Admitting: Pharmacist

## 2022-02-04 LAB — URINE CYTOLOGY ANCILLARY ONLY
Chlamydia: NEGATIVE
Comment: NEGATIVE
Comment: NORMAL
Neisseria Gonorrhea: NEGATIVE

## 2022-02-04 NOTE — Telephone Encounter (Signed)
Patient left VM stating he was prescribed a medication for his hernia and wanted to make sure there was no interaction with his heart failure medications. Called patient and he stated he was prescribed oxycodone-acetaminophen. Informed patient that there is no interaction with his heart failure medications and he can start the medication. Patient expressed understanding.   Audry Riles, PharmD, BCPS, BCCP, CPP Heart Failure Clinic Pharmacist [phone]

## 2022-02-10 ENCOUNTER — Other Ambulatory Visit (HOSPITAL_COMMUNITY): Payer: Self-pay | Admitting: *Deleted

## 2022-02-10 MED ORDER — FUROSEMIDE 80 MG PO TABS: ORAL_TABLET | ORAL | 6 refills | Status: DC

## 2022-03-19 ENCOUNTER — Telehealth: Payer: Self-pay

## 2022-03-19 NOTE — Patient Outreach (Signed)
  Care Coordination   03/19/2022 Name: Jerry Edwards MRN: [id number] DOB: 25-Jan-1971   Care Coordination Outreach Attempts:  An unsuccessful telephone outreach was attempted for a scheduled appointment today.  Follow Up Plan:  Additional outreach attempts will be made to offer the patient care coordination information and services.   Encounter Outcome:  No Answer   Care Coordination Interventions:  No, not indicated    Peter Garter RN, BSN,CCM, CDE Care Management Coordinator [facility] [phone]

## 2022-03-27 ENCOUNTER — Ambulatory Visit: Payer: Self-pay

## 2022-03-27 NOTE — Patient Instructions (Signed)
Visit Information  Thank you for taking time to visit with me today. Please don't hesitate to contact me if I can be of assistance to you.   Following are the goals we discussed today:   Goals Addressed             This Visit's Progress    Care Coordination Activities- Improved self management of HF       Care Coordination Interventions: Advised patient to call primary care provider to schedule follow up appointment and annual wellness visit Discussed plans with patient for ongoing care management follow up and provided patient with direct contact information for care management team Assessed social determinant of health barriers Provided education on low sodium diet Reviewed Heart Failure Action Plan in depth and provided written copy Provided education about placing scale on hard, flat surface Advised patient to weigh each morning after emptying bladder Discussed importance of daily weight and advised patient to weigh and record daily Reviewed role of diuretics in prevention of fluid overload and management of heart failure; Discussed the importance of keeping all appointments with provider Provided patient with education about the role of exercise in the management of heart failure Reviewed to try to weight regularly and to try to set up a routine each morning to weight          Our next appointment is by telephone on 05/27/22 at 1 PM  Please call the care guide team at [phone] if you need to cancel or reschedule your appointment.   If you are experiencing a Mental Health or [place] or need someone to talk to, please call the Suicide and Crisis Lifeline: 988 call the [place] National Suicide Prevention Lifeline: 706-414-6058 or TTY: 8137695776 TTY (832)490-5747) to talk to a trained counselor call 1-800-273-TALK (toll free, 24 hour hotline) go to [facility] [address], [place] [phone]) call 911    Patient verbalizes understanding of instructions and care plan provided today and agrees to view in [facility]. Active MyChart status and patient understanding of how to access instructions and care plan via MyChart confirmed with patient.     Telephone follow up appointment with care management team member scheduled for:05/27/22  Peter Garter RN, [facility], [place] Management Coordinator [facility] [phone]

## 2022-03-27 NOTE — Patient Outreach (Signed)
  Care Coordination   Follow Up Visit Note   03/27/2022 Name: Jerry Edwards MRN: [id number] DOB: 11-13-1970  Jerry Edwards is a 52 y.o. year old male who sees Bensimhon, Shaune Pascal, MD for primary care. I spoke with  Jerry Edwards by phone today.  What matters to the patients health and wellness today?  I am doing good today.  Denies any swelling or shortness of breath.  States he has not been weighing every day but is trying to do better since the start of the new year.  Reports he is also trying to follow his low sodium lower CHO diet better.    Goals Addressed             This Visit's Progress    Care Coordination Activities- Improved self management of HF       Care Coordination Interventions: Advised patient to call primary care provider to schedule follow up appointment and annual wellness visit Discussed plans with patient for ongoing care management follow up and provided patient with direct contact information for care management team Assessed social determinant of health barriers Provided education on low sodium diet Reviewed Heart Failure Action Plan in depth and provided written copy Provided education about placing scale on hard, flat surface Advised patient to weigh each morning after emptying bladder Discussed importance of daily weight and advised patient to weigh and record daily Reviewed role of diuretics in prevention of fluid overload and management of heart failure; Discussed the importance of keeping all appointments with provider Provided patient with education about the role of exercise in the management of heart failure Reviewed to try to weight regularly and to try to set up a routine each morning to weight          SDOH assessments and interventions completed:  Yes  SDOH Interventions Today    Flowsheet Row Most Recent Value  SDOH Interventions   Food Insecurity Interventions Intervention Not Indicated  Transportation Interventions Intervention Not  Indicated  Physical Activity Interventions Other (Comments)  [reviewed exercise benefit]        Care Coordination Interventions:  Yes, provided   Follow up plan: Follow up call scheduled for 36/24    Encounter Outcome:  Pt. Visit Completed  Peter Garter RN, [facility], CDE Care Management Coordinator [place] [facility] [phone]

## 2022-03-30 DIAGNOSIS — G4733 Obstructive sleep apnea (adult) (pediatric): Secondary | ICD-10-CM | POA: Diagnosis not present

## 2022-04-17 ENCOUNTER — Other Ambulatory Visit (HOSPITAL_COMMUNITY): Payer: Self-pay | Admitting: Family Medicine

## 2022-05-04 ENCOUNTER — Other Ambulatory Visit: Payer: Self-pay

## 2022-05-04 MED ORDER — ISOSORBIDE MONONITRATE ER 30 MG PO TB24: 30.0000 mg | ORAL_TABLET | Freq: Every day | ORAL | 0 refills | Status: DC

## 2022-05-11 ENCOUNTER — Other Ambulatory Visit (HOSPITAL_COMMUNITY): Payer: Self-pay

## 2022-05-11 MED ORDER — CARVEDILOL 12.5 MG PO TABS: 12.5000 mg | ORAL_TABLET | Freq: Two times a day (BID) | ORAL | 6 refills | Status: DC

## 2022-05-27 ENCOUNTER — Ambulatory Visit: Payer: Self-pay

## 2022-05-27 NOTE — Patient Outreach (Signed)
  Care Coordination   05/27/2022 Name: Jerry Edwards MRN: [id number] DOB: September 23, 1970   Care Coordination Outreach Attempts:  An unsuccessful telephone outreach was attempted for a scheduled appointment today.  Follow Up Plan:  Additional outreach attempts will be made to offer the patient care coordination information and services.   Encounter Outcome:  No Answer   Care Coordination Interventions:  No, not indicated    SIG Peter Garter RN, BSN,CCM, CDE Care Management Coordinator [place] [facility] [phone]

## 2022-06-04 ENCOUNTER — Telehealth: Payer: Self-pay | Admitting: *Deleted

## 2022-06-04 NOTE — Progress Notes (Signed)
  Care Coordination Note  06/04/2022 Name: Jerry Edwards MRN: [id number] DOB: Aug 01, 1970  Jerry Edwards is a 52 y.o. year old male who is a primary care patient of Bensimhon, Shaune Pascal, MD and is actively engaged with the care management team. I reached out to Rexene Agent by phone today to assist with re-scheduling a follow up visit with the RN Case Manager  Follow up plan: Unsuccessful telephone outreach attempt made. A HIPAA compliant phone message was left for the patient providing contact information and requesting a return call.   [place]  Direct Dial: [phone]

## 2022-06-08 ENCOUNTER — Other Ambulatory Visit: Payer: Self-pay | Admitting: Cardiovascular Disease

## 2022-06-09 NOTE — Telephone Encounter (Signed)
This is a CHF pt 

## 2022-06-09 NOTE — Progress Notes (Signed)
  Care Coordination Note  06/09/2022 Name: Jerry Edwards MRN: [id number] DOB: 14-Nov-1970  Jerry Edwards is a 52 y.o. year old male who is a primary care patient of Bensimhon, Shaune Pascal, MD and is actively engaged with the care management team. I reached out to Rexene Agent by phone today to assist with re-scheduling a follow up visit with the RN Case Manager  Follow up plan: Unsuccessful telephone outreach attempt made. A HIPAA compliant phone message was left for the patient providing contact information and requesting a return call. We have been unable to make contact with the patient for follow up.  [place]  Direct Dial: [phone]

## 2022-06-11 ENCOUNTER — Other Ambulatory Visit (HOSPITAL_COMMUNITY): Payer: Self-pay | Admitting: Family Medicine

## 2022-06-28 DIAGNOSIS — G4733 Obstructive sleep apnea (adult) (pediatric): Secondary | ICD-10-CM | POA: Diagnosis not present

## 2022-08-09 ENCOUNTER — Other Ambulatory Visit (HOSPITAL_COMMUNITY): Payer: Self-pay | Admitting: Internal Medicine

## 2022-08-09 DIAGNOSIS — I5022 Chronic systolic (congestive) heart failure: Secondary | ICD-10-CM

## 2022-08-12 ENCOUNTER — Other Ambulatory Visit (HOSPITAL_COMMUNITY): Payer: Self-pay | Admitting: Family Medicine

## 2022-08-30 ENCOUNTER — Other Ambulatory Visit: Payer: Self-pay | Admitting: Internal Medicine

## 2022-09-28 DIAGNOSIS — G4733 Obstructive sleep apnea (adult) (pediatric): Secondary | ICD-10-CM | POA: Diagnosis not present

## 2022-10-08 DIAGNOSIS — H40013 Open angle with borderline findings, low risk, bilateral: Secondary | ICD-10-CM | POA: Diagnosis not present

## 2022-10-08 DIAGNOSIS — H02101 Unspecified ectropion of right upper eyelid: Secondary | ICD-10-CM | POA: Diagnosis not present

## 2022-10-08 DIAGNOSIS — H00011 Hordeolum externum right upper eyelid: Secondary | ICD-10-CM | POA: Diagnosis not present

## 2022-10-11 ENCOUNTER — Other Ambulatory Visit (HOSPITAL_COMMUNITY): Payer: Self-pay | Admitting: Family Medicine

## 2022-10-14 ENCOUNTER — Other Ambulatory Visit (HOSPITAL_COMMUNITY): Payer: Self-pay | Admitting: Internal Medicine

## 2022-10-22 ENCOUNTER — Other Ambulatory Visit (HOSPITAL_COMMUNITY): Payer: Self-pay | Admitting: Family Medicine

## 2022-11-02 ENCOUNTER — Other Ambulatory Visit (HOSPITAL_COMMUNITY): Payer: Self-pay | Admitting: Internal Medicine

## 2022-11-02 DIAGNOSIS — I5022 Chronic systolic (congestive) heart failure: Secondary | ICD-10-CM

## 2022-11-13 ENCOUNTER — Other Ambulatory Visit (HOSPITAL_COMMUNITY): Payer: Self-pay

## 2022-11-13 MED ORDER — FUROSEMIDE 80 MG PO TABS: ORAL_TABLET | ORAL | 0 refills | Status: DC

## 2022-11-30 ENCOUNTER — Other Ambulatory Visit (HOSPITAL_COMMUNITY): Payer: Self-pay | Admitting: Internal Medicine

## 2022-12-01 ENCOUNTER — Telehealth (HOSPITAL_COMMUNITY): Payer: Self-pay | Admitting: Cardiology

## 2022-12-01 MED ORDER — FUROSEMIDE 80 MG PO TABS: ORAL_TABLET | ORAL | 3 refills | Status: DC

## 2022-12-01 NOTE — Telephone Encounter (Signed)
Patient called to schedule follow up for refills of lasix.   Appt made and refills returned to pharmacy

## 2022-12-06 ENCOUNTER — Other Ambulatory Visit (HOSPITAL_COMMUNITY): Payer: Self-pay | Admitting: Family Medicine

## 2022-12-07 ENCOUNTER — Other Ambulatory Visit: Payer: Self-pay | Admitting: Internal Medicine

## 2022-12-07 ENCOUNTER — Other Ambulatory Visit (HOSPITAL_COMMUNITY): Payer: Self-pay

## 2022-12-07 MED ORDER — ISOSORBIDE MONONITRATE ER 30 MG PO TB24: 30.0000 mg | ORAL_TABLET | Freq: Every day | ORAL | 0 refills | Status: DC

## 2022-12-07 MED ORDER — DOXAZOSIN MESYLATE 2 MG PO TABS: 2.0000 mg | ORAL_TABLET | Freq: Every day | ORAL | 0 refills | Status: DC

## 2022-12-07 NOTE — Telephone Encounter (Signed)
Meds ordered this encounter  Medications   doxazosin (CARDURA) 2 MG tablet    Sig: Take 1 tablet (2 mg total) by mouth at bedtime.    Dispense:  90 tablet    Refill:  0   isosorbide mononitrate (IMDUR) 30 MG 24 hr tablet    Sig: Take 1 tablet (30 mg total) by mouth daily.    Dispense:  90 tablet    Refill:  0

## 2022-12-08 ENCOUNTER — Other Ambulatory Visit (HOSPITAL_COMMUNITY): Payer: Self-pay | Admitting: Physician Assistant

## 2022-12-26 ENCOUNTER — Other Ambulatory Visit (HOSPITAL_COMMUNITY): Payer: Self-pay | Admitting: Internal Medicine

## 2022-12-28 DIAGNOSIS — G4733 Obstructive sleep apnea (adult) (pediatric): Secondary | ICD-10-CM | POA: Diagnosis not present

## 2023-01-17 ENCOUNTER — Other Ambulatory Visit (HOSPITAL_COMMUNITY): Payer: Self-pay | Admitting: Internal Medicine

## 2023-01-19 ENCOUNTER — Encounter (HOSPITAL_COMMUNITY): Payer: Self-pay | Admitting: Internal Medicine

## 2023-01-19 ENCOUNTER — Ambulatory Visit (HOSPITAL_COMMUNITY)
Admission: RE | Admit: 2023-01-19 | Discharge: 2023-01-19 | Disposition: A | Discharge status: Home / Self Care | Payer: 59 | Source: Ambulatory Visit | Attending: Internal Medicine | Admitting: Internal Medicine

## 2023-01-19 VITALS — BP 160/94 | HR 72 | Wt 225.8 lb

## 2023-01-19 DIAGNOSIS — I5022 Chronic systolic (congestive) heart failure: Secondary | ICD-10-CM | POA: Diagnosis not present

## 2023-01-19 DIAGNOSIS — I130 Hypertensive heart and chronic kidney disease with heart failure and stage 1 through stage 4 chronic kidney disease, or unspecified chronic kidney disease: Secondary | ICD-10-CM | POA: Insufficient documentation

## 2023-01-19 DIAGNOSIS — N184 Chronic kidney disease, stage 4 (severe): Secondary | ICD-10-CM | POA: Diagnosis not present

## 2023-01-19 DIAGNOSIS — Z993 Dependence on wheelchair: Secondary | ICD-10-CM | POA: Diagnosis not present

## 2023-01-19 DIAGNOSIS — I428 Other cardiomyopathies: Secondary | ICD-10-CM | POA: Diagnosis not present

## 2023-01-19 DIAGNOSIS — I272 Pulmonary hypertension, unspecified: Secondary | ICD-10-CM | POA: Diagnosis not present

## 2023-01-19 DIAGNOSIS — I447 Left bundle-branch block, unspecified: Secondary | ICD-10-CM | POA: Insufficient documentation

## 2023-01-19 DIAGNOSIS — Z79899 Other long term (current) drug therapy: Secondary | ICD-10-CM | POA: Diagnosis not present

## 2023-01-19 DIAGNOSIS — Q780 Osteogenesis imperfecta: Secondary | ICD-10-CM | POA: Insufficient documentation

## 2023-01-19 DIAGNOSIS — G4733 Obstructive sleep apnea (adult) (pediatric): Secondary | ICD-10-CM | POA: Insufficient documentation

## 2023-01-19 DIAGNOSIS — Z6841 Body Mass Index (BMI) 40.0 and over, adult: Secondary | ICD-10-CM | POA: Insufficient documentation

## 2023-01-19 DIAGNOSIS — I340 Nonrheumatic mitral (valve) insufficiency: Secondary | ICD-10-CM | POA: Diagnosis not present

## 2023-01-19 DIAGNOSIS — Z7984 Long term (current) use of oral hypoglycemic drugs: Secondary | ICD-10-CM | POA: Diagnosis not present

## 2023-01-19 DIAGNOSIS — Z9981 Dependence on supplemental oxygen: Secondary | ICD-10-CM | POA: Diagnosis not present

## 2023-01-19 LAB — BASIC METABOLIC PANEL
Anion gap: 11 (ref 5–15)
BUN: 47 mg/dL — ABNORMAL HIGH (ref 6–20)
CO2: 24 mmol/L (ref 22–32)
Calcium: 8.6 mg/dL — ABNORMAL LOW (ref 8.9–10.3)
Chloride: 110 mmol/L (ref 98–111)
Creatinine, Ser: 4.29 mg/dL — ABNORMAL HIGH (ref 0.61–1.24)
GFR, Estimated: 16 mL/min — ABNORMAL LOW (ref >60)
Glucose, Bld: 87 mg/dL (ref 70–99)
Potassium: 4.1 mmol/L (ref 3.5–5.1)
Sodium: 145 mmol/L (ref 135–145)

## 2023-01-19 LAB — BRAIN NATRIURETIC PEPTIDE: B Natriuretic Peptide: 769.3 pg/mL — ABNORMAL HIGH (ref 0.0–100.0)

## 2023-01-19 NOTE — Progress Notes (Signed)
Patient ID: Jerry Edwards, male   DOB: 04-Oct-1970, 52 y.o.   MRN: [id number]   [facility] PROGRESS NOTE    Primary Physician: Jerry Patty, MD Primary Cardiologist: [name] Cardiologist: Dr. Gala Edwards   HPI: Jerry Edwards is a 52 y.o. male with history of NICM, Chronic systolic heart failure, HTN, OSA, and osteogenesis imperfecta.   Admitted 1/15 with HF symptoms. ECHO 04/08/2013. EF 25%. cMRI 12/15 EF 38% possible infiltrative disease with discrete areas of non-coronary pattern delayed enhancement. Coronaries were normal on LHC.  SPEP no M spike and UPEP no free light chains.   PYP scan 8/18 with ratio of 1.2 (felt to be negative) - unable to have MRI (couldnt fit)  Admitted 6/19 with ADHF. Echo showed lower EF 25-30%. HF medications optimized. DC weight: 216 lbs  Sleep study 8/19 AHI 88/hr  Admitted 11/22 Echo showed EF 35-40%. Hstrop 42-> 38  Seen 02/14/21 at [facility] for psychotic episode. He became combative and left AMA.  Admitted 3/23 for a/c CHF and AKI. Jerry Edwards and Jerry Edwards held. Diuresed w/ IV Lasix. Echo EF 30-35%. RV normal. New mitral regurgitation that appeared severe by echo.  RHC showed mild volume overload, moderate mixed pulmonary HTN, no evidence of significant v-waves in PCWP tracing, high cardiac output. TEE: Mild P2 restriction with 2-3+ (mod-severe central and posterior MR). Findings were d/w structural heart team. Valve is amenable to clipping but not severe enough at this point. Plan to repeat echo in 6 months.  Here with his mom for f/u. Playing with his music group regularly (he in [place] and plays music). Denies CP or SOB. Edema well controlled. Compliant with meds. Not following BPs closely.   Cardiac studies:  - RHC (3/23):    RA = 9 RV = 58/14 PA = 61/30 (40) PCW = 21 (no significant v-waves) Fick cardiac output/index = 7.4/4.0 PVR = 2.6 WU FA sat = 92% PA sat = 69%, 72% SVC sat = 74%   Assessment: 1. Mild volume overload 2.  Moderate mixed pulmonary HTN 3. No evidence of significant v-waves in PCWP tracing 4. High cardiac output   - TEE (3/23):  LEFT VENTRICLE: EF = 30-55%. Anterior and anteroapical AK MITRAL VALVE:    Mild P2 restriction with 2-3+ (mod-severe central and posterior MR). No significant reversal of flow in PVs  Echo 3/22 EF 30%, severe MR, Normal RV Echo 11/22 EF 35-40% Echo 10/19 EF 30-35% Echo 09/20/17: EF 25-30%, grade 2 DD Echo 10/13/16 LVEF 35-40%, Grade 1 DD, Mild MR, Mild LAE Echo 1/17 30-35% RV ok  Echo 02/22/16 LVEF 40%, Grade 1 DD.    - Myoview 10/15/16 No change in EKG from baseline T wave inversions in the inferolateral leads There is a medium defect of mild severity present in the basal inferior, mid inferior and apical inferior location. The defect is non-reversible and consistent with prior infarct. There is a small defect of moderate severity present in the apex location. The defect is partially reversible and consistent with infarct with possible small area of peri infarct ischemia. This is a high risk study. EF not calculated but visually appears to be moderately to severely reduced.   LHC/RHC 04/08/13  Normal coronaries RA pressure:  39/39 with a mean of 34. Prominent X. and Y. Descends.  RV pressure: 76/26  mean 4 PA pressure: 70/36 with a mean of 51  Pulmonary capillary wedge pressure: 31/30 and mean of 29  LV pressure: 140/26 with a left ventricular end-diastolic pressure  of 35  PA sat 66%  AO sat 97%  CO/CI 4.94/2.63  Pulmonary vascular resistance: 4.45 [place] units   PMH: 1. HTN 2. Osteogenesis imperfecta: wheelchair-bound.  History of bone fractures.  3. Suspect OSA 4. CKD 5. Nonischemic cardiomyopathy: Echo (1/15) with EF 20-25%, severe diffuse hypokinesis, mild MR, mildly enlarged RV with moderately decreased systolic function.  LHC/RHC (1/15) with normal coronaries, mean RA 34, PA 70/36 mean 51, mean PCWP 29, CI 2.63.  Cardiac MRI (1/15) with EF 38%, global  hypokinesis, discrete areas of delayed enhancement in the subepicardial mid inferoseptal RV insertion site, the mid wall of the mid inferolateral wall, and the subendocardial mid anterolateral wall.  This pattern was suggestive of infiltrative disease.  UPEP/SPEP negative, HIV negative.  Chest CT to look for evidence of sarcoidosis did not show any sarcoid-type lesions.    SH:  Single, prior smoking now quit, occasional marijuana, works as Geologist, engineering.   FH:  HTN, no significant cardiac disease.   Current Outpatient Medications  Medication Sig Dispense Refill   aspirin 81 MG tablet Take 1 tablet (81 mg total) by mouth daily.     carvedilol (COREG) 12.5 MG tablet TAKE 1 TABLET BY MOUTH TWICE DAILY WITH A MEAL 180 tablet 0   doxazosin (CARDURA) 2 MG tablet Take 1 tablet (2 mg total) by mouth at bedtime. 90 tablet 0   empagliflozin (JARDIANCE) 10 MG TABS tablet Take 1 tablet (10 mg total) by mouth daily. 90 tablet 0   erythromycin ophthalmic ointment Place 1 application into the right eye at bedtime.     furosemide (LASIX) 80 MG tablet TAKE 1 TABLET BY MOUTH TWICE DAILY. CAN TAKE AN EXTRA  1/2 TABLET FOR 3 POUND WEIGHT GAIN. 60 tablet 3   hydrALAZINE (APRESOLINE) 100 MG tablet TAKE 1 TABLET BY MOUTH THREE TIMES DAILY . APPOINTMENT REQUIRED FOR FUTURE REFILLS 270 tablet 0   isosorbide mononitrate (IMDUR) 30 MG 24 hr tablet Take 1 tablet (30 mg total) by mouth daily. 90 tablet 0   Olopatadine HCl (PATADAY OP) Place 1 drop into both eyes in the morning and at bedtime.     OXYGEN Inhale 2 L into the lungs as needed (shortness of breath).     potassium chloride SA (KLOR-CON M) 20 MEQ tablet Take 2 tablets by mouth once daily 180 tablet 3   No current facility-administered medications for this encounter.   BP (!) 160/94   Pulse 72   Wt 102.4 kg (225 lb 12.8 oz)   SpO2 94%   BMI 45.61 kg/m   Wt Readings from Last 3 Encounters:  01/19/23 102.4 kg (225 lb 12.8 oz)  12/11/21 93.4 kg (206 lb)  09/01/21  92.9 kg (204 lb 12.8 oz)    PHYSICAL EXAM: General:  Well appearing. Arrived in wheelchair. Short stature (osteogenesis imperfecta) HEENT: normal Neck: supple. JVP difficult d/t thick neck. Carotids 2+ bilat; no bruits.  Cor: PMI nondisplaced. Regular rate & rhythm. No rubs, gallops or murmurs. Lungs: clear Abdomen: obese soft, nontender, nondistended. No hepatosplenomegaly. No bruits or masses. Good bowel sounds. Extremities: no cyanosis, clubbing, rash, edema Neuro: alert & orientedx3, cranial nerves grossly intact. moves all 4 extremities w/o difficulty. Affect pleasant  ASSESSMENT & PLAN:  1. Chronic Systolic Heart Failure/NICM  - unable to fit in cMRI machine due to body stature.  PYP scan 8/18 no amyloid - Echo 09/2017: EF 25-30%, grade 2 DD - Echo 10/19: EF  30-35% - Echo 5/21 EF 35% - Echo 11/22  35-40% - Echo 3/23 EF 30-35%.  RV normal  - RHC 3/23 with mildly elevated filling pressures. High output  - Echo 9/23 EF 30-35% mild MR - Has LBBB but QRS not wide enough for CRT - Stable NYHA II. Limited by weight and orthopedic issues - Volume status ok - Continue Lasix 80 mg bid with extra 40 mg PRN for 3 pound weight gain - No spiro or Entresto with CKD  - Continue Jardiance 10 mg daily.  - Continue hydralazine 100 mg tid + Imdur 60 mg daily. - Continue Coreg 12.5 mg bid. - Labs today   2. Hypertension  - BP elevated in clinic. But home readings are better  - Continue Cardura 2 mg q hs. Can increase as needed - other GDMT per above    3.  Mitral Regurgitation  - TEE 3/23 showed Mild P2 restriction with 2-3+ (mod-severe central and posterior MR). Findings were d/w structural heart team. Valve is amenable to clipping but not severe enough at this point.  - Mild on echo 9/23 - HF optimization per above.  4. 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  - On home O2,  2L [place] - Has known OSA on CPAP.    5.  Osteogenesis imperfecta  - Wheel chair  bound; limited mobility.  - No change   6. OSA  - Wears O2 at night and PRN during the day.  - Had PSG on 11/07/17 with AHI 88/hr c/w very severe OSA.  - Compliant with CPAP. Follows with Dr. Mayford Knife   7. Morbid Obesity  - Body mass index is 45.61 kg/m. - I discussed [id number]  - He wants to try to do it on his own first  8. Pulmonary hypertension - recent RHC c/w moderate mixed pulmonary HTN - Combination WHO Groups 2+ 3 - Continue diuretics, supp O2 and CPAP    9. Stage IV CKD  - SCr baseline ~3  - Renal US- no hydronephrosis  - On SGLT2i. - Labs today. - Will refer to Nephrology   Arvilla Meres, MD  10:58 AM

## 2023-01-19 NOTE — Patient Instructions (Signed)
There has been no changes to your medications.  Labs done today, your results will be available in MyChart, we will contact you for abnormal readings.  Your provider has referred you to [facility]. They will call you to arrange your appointment.  Your physician recommends that you schedule a follow-up appointment in: 6 months ( April 2025) ** PLEASE CALL THE OFFICE IN [place] TO ARRANGE YOUR FOLLOW UP APPOINTMENT. **  If you have any questions or concerns before your next appointment please send [place] a message through [place] or call our office at [phone].    TO LEAVE A MESSAGE FOR THE NURSE SELECT OPTION 2, PLEASE LEAVE A MESSAGE INCLUDING: YOUR NAME DATE OF BIRTH CALL BACK NUMBER REASON FOR CALL**this is important as we prioritize the call backs  YOU WILL RECEIVE A CALL BACK THE SAME DAY AS LONG AS YOU CALL BEFORE 4:00 PM  At the [facility], you and your health needs are our priority. As part of our continuing mission to provide you with exceptional heart care, we have created designated Provider Care Teams. These Care Teams include your primary Cardiologist (physician) and Advanced Practice Providers (APPs- Physician Assistants and Nurse Practitioners) who all work together to provide you with the care you need, when you need it.   You may see any of the following providers on your designated Care Team at your next follow up: Dr Arvilla Meres Dr Marca Ancona Dr. Dorthula Nettles Dr. Clearnce Hasten Amy Filbert Schilder, NP Robbie Lis, [place] [facility] [place], [place] Brynda Peon, NP [place] Lee, NP Karle Plumber, PharmD   Please be sure to bring in all your medications bottles to every appointment.    Thank you for choosing [facility]

## 2023-01-21 ENCOUNTER — Telehealth (HOSPITAL_COMMUNITY): Payer: Self-pay | Admitting: *Deleted

## 2023-01-21 DIAGNOSIS — I5022 Chronic systolic (congestive) heart failure: Secondary | ICD-10-CM

## 2023-01-21 DIAGNOSIS — H00011 Hordeolum externum right upper eyelid: Secondary | ICD-10-CM | POA: Diagnosis not present

## 2023-01-21 DIAGNOSIS — H02101 Unspecified ectropion of right upper eyelid: Secondary | ICD-10-CM | POA: Diagnosis not present

## 2023-01-21 DIAGNOSIS — H40013 Open angle with borderline findings, low risk, bilateral: Secondary | ICD-10-CM | POA: Diagnosis not present

## 2023-01-21 DIAGNOSIS — N184 Chronic kidney disease, stage 4 (severe): Secondary | ICD-10-CM

## 2023-01-21 MED ORDER — FUROSEMIDE 80 MG PO TABS: ORAL_TABLET | ORAL | 3 refills | Status: DC

## 2023-01-21 NOTE — Telephone Encounter (Signed)
Called patient per Dr. Gala Romney with following lab results and instructions:  "Scr elevated. Lets cut lasix back to 80/40 and repeat BMET next week"  Pt verbalized understanding of same. Repeat lab ordered and scheduled for next week.

## 2023-01-28 ENCOUNTER — Other Ambulatory Visit (HOSPITAL_COMMUNITY): Payer: Self-pay | Admitting: Internal Medicine

## 2023-01-28 DIAGNOSIS — I5022 Chronic systolic (congestive) heart failure: Secondary | ICD-10-CM

## 2023-02-04 ENCOUNTER — Ambulatory Visit (HOSPITAL_COMMUNITY)
Admission: RE | Admit: 2023-02-04 | Discharge: 2023-02-04 | Disposition: A | Discharge status: Home / Self Care | Payer: 59 | Source: Ambulatory Visit | Attending: Cardiology | Admitting: Cardiology

## 2023-02-04 DIAGNOSIS — I5022 Chronic systolic (congestive) heart failure: Secondary | ICD-10-CM | POA: Insufficient documentation

## 2023-02-04 DIAGNOSIS — N184 Chronic kidney disease, stage 4 (severe): Secondary | ICD-10-CM | POA: Diagnosis not present

## 2023-02-04 LAB — BASIC METABOLIC PANEL
Anion gap: 8 (ref 5–15)
BUN: 50 mg/dL — ABNORMAL HIGH (ref 6–20)
CO2: 23 mmol/L (ref 22–32)
Calcium: 8.5 mg/dL — ABNORMAL LOW (ref 8.9–10.3)
Chloride: 109 mmol/L (ref 98–111)
Creatinine, Ser: 4.85 mg/dL — ABNORMAL HIGH (ref 0.61–1.24)
GFR, Estimated: 14 mL/min — ABNORMAL LOW (ref >60)
Glucose, Bld: 86 mg/dL (ref 70–99)
Potassium: 3.9 mmol/L (ref 3.5–5.1)
Sodium: 140 mmol/L (ref 135–145)

## 2023-03-02 ENCOUNTER — Other Ambulatory Visit (HOSPITAL_COMMUNITY): Payer: Self-pay | Admitting: Cardiology

## 2023-03-26 ENCOUNTER — Other Ambulatory Visit (HOSPITAL_COMMUNITY): Payer: Self-pay | Admitting: Internal Medicine

## 2023-03-29 DIAGNOSIS — G4733 Obstructive sleep apnea (adult) (pediatric): Secondary | ICD-10-CM | POA: Diagnosis not present

## 2023-04-14 ENCOUNTER — Other Ambulatory Visit (HOSPITAL_COMMUNITY): Payer: Self-pay | Admitting: Internal Medicine

## 2023-05-06 ENCOUNTER — Emergency Department (HOSPITAL_COMMUNITY): Payer: 59

## 2023-05-06 ENCOUNTER — Emergency Department (HOSPITAL_COMMUNITY)
Admission: EM | Admit: 2023-05-06 | Discharge: 2023-05-06 | Disposition: A | Discharge status: Home / Self Care | Payer: 59 | Attending: Emergency Medicine | Admitting: Emergency Medicine

## 2023-05-06 ENCOUNTER — Other Ambulatory Visit: Payer: Self-pay

## 2023-05-06 DIAGNOSIS — Z7984 Long term (current) use of oral hypoglycemic drugs: Secondary | ICD-10-CM | POA: Insufficient documentation

## 2023-05-06 DIAGNOSIS — I509 Heart failure, unspecified: Secondary | ICD-10-CM | POA: Insufficient documentation

## 2023-05-06 DIAGNOSIS — J101 Influenza due to other identified influenza virus with other respiratory manifestations: Secondary | ICD-10-CM | POA: Diagnosis not present

## 2023-05-06 DIAGNOSIS — J189 Pneumonia, unspecified organism: Secondary | ICD-10-CM | POA: Insufficient documentation

## 2023-05-06 DIAGNOSIS — I110 Hypertensive heart disease with heart failure: Secondary | ICD-10-CM | POA: Diagnosis not present

## 2023-05-06 DIAGNOSIS — Z79899 Other long term (current) drug therapy: Secondary | ICD-10-CM | POA: Insufficient documentation

## 2023-05-06 DIAGNOSIS — R509 Fever, unspecified: Secondary | ICD-10-CM | POA: Diagnosis present

## 2023-05-06 DIAGNOSIS — R058 Other specified cough: Secondary | ICD-10-CM | POA: Diagnosis not present

## 2023-05-06 DIAGNOSIS — Z7982 Long term (current) use of aspirin: Secondary | ICD-10-CM | POA: Insufficient documentation

## 2023-05-06 DIAGNOSIS — R918 Other nonspecific abnormal finding of lung field: Secondary | ICD-10-CM | POA: Diagnosis not present

## 2023-05-06 DIAGNOSIS — Z91040 Latex allergy status: Secondary | ICD-10-CM | POA: Diagnosis not present

## 2023-05-06 DIAGNOSIS — Z91010 Allergy to peanuts: Secondary | ICD-10-CM | POA: Diagnosis not present

## 2023-05-06 DIAGNOSIS — I517 Cardiomegaly: Secondary | ICD-10-CM | POA: Diagnosis not present

## 2023-05-06 DIAGNOSIS — E119 Type 2 diabetes mellitus without complications: Secondary | ICD-10-CM | POA: Diagnosis not present

## 2023-05-06 DIAGNOSIS — R0989 Other specified symptoms and signs involving the circulatory and respiratory systems: Secondary | ICD-10-CM | POA: Diagnosis not present

## 2023-05-06 LAB — RESP PANEL BY RT-PCR (RSV, FLU A&B, COVID)  RVPGX2
Influenza A by PCR: POSITIVE — AB
Influenza B by PCR: NEGATIVE
Resp Syncytial Virus by PCR: NEGATIVE
SARS Coronavirus 2 by RT PCR: NEGATIVE

## 2023-05-06 MED ORDER — AZITHROMYCIN 250 MG PO TABS: ORAL_TABLET | ORAL | 0 refills | Status: DC

## 2023-05-06 MED ORDER — BENZONATATE 100 MG PO CAPS: 100.0000 mg | ORAL_CAPSULE | Freq: Three times a day (TID) | ORAL | 0 refills | Status: DC

## 2023-05-06 NOTE — ED Triage Notes (Signed)
Pt arrived via POV. C/o productive cough for several days. On 2L [place] at baseline d/t CHF.  AOx4

## 2023-05-06 NOTE — ED Provider Notes (Signed)
[place] EMERGENCY DEPARTMENT AT [facility] Provider Note   CSN: [id number] Arrival date & time: 05/06/23  1715     History  Chief Complaint  Patient presents with   Cough    Jerry Edwards is a 53 y.o. male.  The history is provided by the patient and medical records. No language interpreter was used.  Cough    53 year old male history of CHF on 2L [place], nonischemic cardiomyopathy, OSA, obesity, hypertension, diabetes, osteogenesis imperfecta presented to ED with complaints of a cough.  For the past 4 days patient has had productive cough.  He normally has oxygen available at home but does not necessarily need to use it but did felt little bit increased shortness of breath while coughing.  Cough is productive.  He does not endorse any significant fever or chills no congestion no sore throat.  He did notice some loose stools but no nausea vomiting.  He denies having noticed any leg swelling.  Denies any body aches or chest pain.  Home Medications Prior to Admission medications   Medication Sig Start Date End Date Taking? Authorizing Provider  aspirin 81 MG tablet Take 1 tablet (81 mg total) by mouth daily. 04/21/13   Vassie Loll, MD  carvedilol (COREG) 12.5 MG tablet TAKE 1 TABLET BY MOUTH TWICE DAILY WITH A MEAL 03/26/23   Bensimhon, Bevelyn Buckles, MD  doxazosin (CARDURA) 2 MG tablet TAKE 1 TABLET BY MOUTH AT BEDTIME 03/02/23   Bensimhon, Bevelyn Buckles, MD  empagliflozin (JARDIANCE) 10 MG TABS tablet Take 1 tablet by mouth once daily 04/14/23   Bensimhon, Bevelyn Buckles, MD  erythromycin ophthalmic ointment Place 1 application into the right eye at bedtime. 05/01/21   [provider]  furosemide (LASIX) 80 MG tablet Take 80 mg in and 40 mg in pm 01/21/23   Bensimhon, Bevelyn Buckles, MD  hydrALAZINE (APRESOLINE) 100 MG tablet Take 1 tablet (100 mg total) by mouth 3 (three) times daily. 01/28/23   Bensimhon, Bevelyn Buckles, MD  isosorbide mononitrate (IMDUR) 30 MG 24 hr tablet Take 1 tablet by  mouth once daily 03/02/23   Bensimhon, Bevelyn Buckles, MD  Olopatadine HCl (PATADAY OP) Place 1 drop into both eyes in the morning and at bedtime.    [provider]  OXYGEN Inhale 2 L into the lungs as needed (shortness of breath).    [provider]  potassium chloride SA (KLOR-CON M) 20 MEQ tablet Take 2 tablets by mouth once daily 12/08/22   Andrey Farmer, PA-C      Allergies    Fish allergy, Other, Peanuts [peanut oil], Shellfish allergy, Latex, and Pork-derived products    Review of Systems   Review of Systems  Respiratory:  Positive for cough.   All other systems reviewed and are negative.   Physical Exam Updated Vital Signs BP (!) 143/94   Pulse 87   Temp 99.9 F (37.7 C)   Resp 18   Ht 4\' 11"  (1.499 m)   Wt 101.6 kg   SpO2 94%   BMI 45.24 kg/m  Physical Exam Vitals and nursing note reviewed.  Constitutional:      General: He is not in acute distress.    Appearance: He is well-developed.  HENT:     Head: Atraumatic.  Eyes:     Conjunctiva/sclera: Conjunctivae normal.     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  Pulses: Normal pulses.     Heart sounds: Normal heart sounds.  Pulmonary:     Breath sounds: No wheezing, rhonchi or rales.  Abdominal:     Palpations: Abdomen is soft.     Tenderness: There is no abdominal tenderness.  Musculoskeletal:     Cervical back: Neck supple.     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  No peripheral edema noted  Skin:    Findings: No rash.  Neurological:     Mental Status: He is alert. Mental status is at baseline.  Psychiatric:        Mood and Affect: Mood normal.     ED Results / Procedures / Treatments   Labs (all labs ordered are listed, but only abnormal results are displayed) Labs Reviewed  RESP PANEL BY RT-PCR (RSV, FLU A&B, COVID)  RVPGX2 - Abnormal; Notable for  the following components:      Result Value   Influenza A by PCR POSITIVE (*)    All other components within normal limits    EKG None  Radiology DG Chest 2 View Result Date: 05/06/2023 CLINICAL DATA:  Productive cough EXAM: CHEST - 2 VIEW COMPARISON:  06/02/2021 FINDINGS: Stable cardiomegaly. Pulmonary vascular congestion. Retrocardiac atelectasis or pneumonia. No definite pleural effusion or pneumothorax. IMPRESSION: Retrocardiac atelectasis or pneumonia. Cardiomegaly and pulmonary vascular congestion. Electronically Signed   By: Minerva Fester M.D.   On: 05/06/2023 20:04    Procedures Procedures    Medications Ordered in ED Medications - No data to display  ED Course/ Medical Decision Making/ A&P                                 Medical Decision Making  BP (!) 143/94   Pulse 87   Temp 99.9 F (37.7 C)   Resp 18   Ht 4\' 11"  (1.499 m)   Wt 101.6 kg   SpO2 94%   BMI 45.24 kg/m   76:49 PM  53 year old male history of CHF on 2L [place], nonischemic cardiomyopathy, OSA, obesity, hypertension, diabetes, osteogenesis imperfecta presented to ED with complaints of a cough.  For the past 4 days patient has had productive cough.  He normally has oxygen available at home but does not necessarily need to use it but did felt little bit increased shortness of breath while coughing.  Cough is productive.  He does not endorse any significant fever or chills no congestion no sore throat.  He did notice some loose stools but no nausea vomiting.  He denies having noticed any leg swelling.  Denies any body aches or chest pain.  On exam, patient is sitting in a wheelchair appears to be in no acute acute discomfort.  He is wearing supplemental oxygen.  Skin is warm to the touch, lungs otherwise unremarkable no wheezes rales or rhonchi heard.  No evidence of peripheral edema on exam.  Vitals are notable for an oral temperature of 99.9, patient is at 94% on 2 L.  He was initially at 87% on room air.   However he does have supplemental oxygen at home available to use as needed.  Chest x-ray obtained independent viewed interpreted by me which shows evidence of possible retrocardiac atelectasis or pneumonia along with cardiomegaly and pulmonary vascular congestion.   In the setting of productive cough, chest discomfort, elevated oral temperature and hypoxia, I felt this is likely to be an infectious etiology causing his symptoms.  It is likely pneumonia and patient will benefit from antibiotic treatment.  He does have hypoxia but does have supplemental oxygen at home that he can use.  Hospital admission considered but I felt patient is stable enough to go home with supportive care which includes antibiotic along with Tessalon for cough.  Respiratory panel is positive for influenza A.  His symptoms likely viral in etiology.  However, in the setting of potential pneumonia on chest x-ray, will prescribe Z-Pak to cover for atypical pneumonia.        Final Clinical Impression(s) / ED Diagnoses Final diagnoses:  Influenza A  Atypical pneumonia    Rx / DC Orders ED Discharge Orders          Ordered    azithromycin (ZITHROMAX Z-PAK) 250 MG tablet        05/06/23 2114    benzonatate (TESSALON) 100 MG capsule  Every 8 hours        05/06/23 2114              Fayrene Helper, PA-C 05/06/23 2115    Lorre Nick, MD 05/06/23 2257

## 2023-05-06 NOTE — ED Provider Triage Note (Signed)
Emergency Medicine Provider Triage Evaluation Note  Miklo Aken , a 53 y.o. male  was evaluated in triage.  Pt complains of cough.  Review of Systems  Positive: Cough w/o SOB Negative: LE edema, fever  Physical Exam  BP (!) 143/94   Pulse 87   Temp 99.9 F (37.7 C)   Resp 18   Ht 4\' 11"  (1.499 m)   Wt 101.6 kg   SpO2 94%   BMI 45.24 kg/m  Gen:   Awake, no distress   Resp:  Normal effort  MSK:   Moves extremities without difficulty  Other:  CTA  Medical Decision Making  Medically screening exam initiated at 7:32 PM.  Appropriate orders placed.  Kayan Blissett was informed that the remainder of the evaluation will be completed by another provider, this initial triage assessment does not replace that evaluation, and the importance of remaining in the ED until their evaluation is complete.  Patient with h/o CHF with productive cough for the past 4 days. No fever. No LE edema. Does not feel this is CHF.   Elpidio Anis, PA-C 05/06/23 1933

## 2023-05-06 NOTE — ED Notes (Signed)
Noted pt O2 87% on RA in lobby. Per pt, pt on 2L at baseline. Pt states he didn't feel like bringing his portable O2 tank in. Triage RN aware

## 2023-05-06 NOTE — Discharge Instructions (Addendum)
You have been evaluated for your symptoms.  You have test positive for influenza A.  This is a viral infection.  However your chest x-ray shows sign of pneumonia therefore please take Z-Pak as prescribed as treatment.  Take Tessalon as needed for cough.  You may alternate between Tylenol and ibuprofen at home as needed for aches and pain.  Continue to wear supplemental oxygen for comfort.  Return if you have any concern.

## 2023-05-22 DIAGNOSIS — G4733 Obstructive sleep apnea (adult) (pediatric): Secondary | ICD-10-CM | POA: Diagnosis not present

## 2023-05-27 ENCOUNTER — Other Ambulatory Visit (HOSPITAL_COMMUNITY): Payer: Self-pay | Admitting: Internal Medicine

## 2023-06-06 ENCOUNTER — Inpatient Hospital Stay (HOSPITAL_COMMUNITY)

## 2023-06-06 ENCOUNTER — Emergency Department (HOSPITAL_COMMUNITY)

## 2023-06-06 ENCOUNTER — Other Ambulatory Visit: Payer: Self-pay

## 2023-06-06 ENCOUNTER — Inpatient Hospital Stay (HOSPITAL_COMMUNITY)
Admission: EM | Admit: 2023-06-06 | Discharge: 2023-06-18 | DRG: 673 | Disposition: A | Discharge status: Home / Self Care | Attending: Internal Medicine | Admitting: Internal Medicine

## 2023-06-06 ENCOUNTER — Encounter (HOSPITAL_COMMUNITY): Payer: Self-pay | Admitting: Internal Medicine

## 2023-06-06 DIAGNOSIS — Z992 Dependence on renal dialysis: Secondary | ICD-10-CM | POA: Diagnosis not present

## 2023-06-06 DIAGNOSIS — N179 Acute kidney failure, unspecified: Secondary | ICD-10-CM | POA: Diagnosis not present

## 2023-06-06 DIAGNOSIS — N170 Acute kidney failure with tubular necrosis: Principal | ICD-10-CM | POA: Diagnosis present

## 2023-06-06 DIAGNOSIS — Z6841 Body Mass Index (BMI) 40.0 and over, adult: Secondary | ICD-10-CM

## 2023-06-06 DIAGNOSIS — E66813 Obesity, class 3: Secondary | ICD-10-CM | POA: Diagnosis not present

## 2023-06-06 DIAGNOSIS — Z91014 Allergy to mammalian meats: Secondary | ICD-10-CM

## 2023-06-06 DIAGNOSIS — R2689 Other abnormalities of gait and mobility: Secondary | ICD-10-CM | POA: Diagnosis not present

## 2023-06-06 DIAGNOSIS — Q780 Osteogenesis imperfecta: Secondary | ICD-10-CM

## 2023-06-06 DIAGNOSIS — Z823 Family history of stroke: Secondary | ICD-10-CM

## 2023-06-06 DIAGNOSIS — I340 Nonrheumatic mitral (valve) insufficiency: Secondary | ICD-10-CM | POA: Diagnosis not present

## 2023-06-06 DIAGNOSIS — Z7982 Long term (current) use of aspirin: Secondary | ICD-10-CM | POA: Diagnosis not present

## 2023-06-06 DIAGNOSIS — E872 Acidosis, unspecified: Secondary | ICD-10-CM | POA: Diagnosis present

## 2023-06-06 DIAGNOSIS — I132 Hypertensive heart and chronic kidney disease with heart failure and with stage 5 chronic kidney disease, or end stage renal disease: Secondary | ICD-10-CM | POA: Diagnosis present

## 2023-06-06 DIAGNOSIS — Z452 Encounter for adjustment and management of vascular access device: Secondary | ICD-10-CM | POA: Diagnosis not present

## 2023-06-06 DIAGNOSIS — I472 Ventricular tachycardia, unspecified: Secondary | ICD-10-CM | POA: Diagnosis present

## 2023-06-06 DIAGNOSIS — D649 Anemia, unspecified: Secondary | ICD-10-CM | POA: Diagnosis not present

## 2023-06-06 DIAGNOSIS — Z87891 Personal history of nicotine dependence: Secondary | ICD-10-CM | POA: Diagnosis not present

## 2023-06-06 DIAGNOSIS — Z91013 Allergy to seafood: Secondary | ICD-10-CM | POA: Diagnosis not present

## 2023-06-06 DIAGNOSIS — I5023 Acute on chronic systolic (congestive) heart failure: Secondary | ICD-10-CM | POA: Diagnosis not present

## 2023-06-06 DIAGNOSIS — I5022 Chronic systolic (congestive) heart failure: Secondary | ICD-10-CM | POA: Diagnosis not present

## 2023-06-06 DIAGNOSIS — Z8249 Family history of ischemic heart disease and other diseases of the circulatory system: Secondary | ICD-10-CM

## 2023-06-06 DIAGNOSIS — R918 Other nonspecific abnormal finding of lung field: Secondary | ICD-10-CM | POA: Diagnosis not present

## 2023-06-06 DIAGNOSIS — E119 Type 2 diabetes mellitus without complications: Secondary | ICD-10-CM

## 2023-06-06 DIAGNOSIS — I10 Essential (primary) hypertension: Secondary | ICD-10-CM | POA: Diagnosis present

## 2023-06-06 DIAGNOSIS — Z993 Dependence on wheelchair: Secondary | ICD-10-CM

## 2023-06-06 DIAGNOSIS — N185 Chronic kidney disease, stage 5: Secondary | ICD-10-CM | POA: Diagnosis not present

## 2023-06-06 DIAGNOSIS — E662 Morbid (severe) obesity with alveolar hypoventilation: Secondary | ICD-10-CM | POA: Diagnosis present

## 2023-06-06 DIAGNOSIS — N189 Chronic kidney disease, unspecified: Secondary | ICD-10-CM | POA: Diagnosis not present

## 2023-06-06 DIAGNOSIS — E875 Hyperkalemia: Secondary | ICD-10-CM | POA: Diagnosis not present

## 2023-06-06 DIAGNOSIS — I509 Heart failure, unspecified: Principal | ICD-10-CM | POA: Insufficient documentation

## 2023-06-06 DIAGNOSIS — G4733 Obstructive sleep apnea (adult) (pediatric): Secondary | ICD-10-CM | POA: Diagnosis not present

## 2023-06-06 DIAGNOSIS — D631 Anemia in chronic kidney disease: Secondary | ICD-10-CM | POA: Diagnosis not present

## 2023-06-06 DIAGNOSIS — I428 Other cardiomyopathies: Secondary | ICD-10-CM | POA: Diagnosis not present

## 2023-06-06 DIAGNOSIS — Z7984 Long term (current) use of oral hypoglycemic drugs: Secondary | ICD-10-CM | POA: Diagnosis not present

## 2023-06-06 DIAGNOSIS — E1121 Type 2 diabetes mellitus with diabetic nephropathy: Secondary | ICD-10-CM | POA: Diagnosis not present

## 2023-06-06 DIAGNOSIS — J9611 Chronic respiratory failure with hypoxia: Secondary | ICD-10-CM | POA: Diagnosis present

## 2023-06-06 DIAGNOSIS — E871 Hypo-osmolality and hyponatremia: Secondary | ICD-10-CM | POA: Diagnosis not present

## 2023-06-06 DIAGNOSIS — E1122 Type 2 diabetes mellitus with diabetic chronic kidney disease: Secondary | ICD-10-CM | POA: Diagnosis present

## 2023-06-06 DIAGNOSIS — Z833 Family history of diabetes mellitus: Secondary | ICD-10-CM

## 2023-06-06 DIAGNOSIS — M898X9 Other specified disorders of bone, unspecified site: Secondary | ICD-10-CM | POA: Diagnosis present

## 2023-06-06 DIAGNOSIS — Z91040 Latex allergy status: Secondary | ICD-10-CM

## 2023-06-06 DIAGNOSIS — N184 Chronic kidney disease, stage 4 (severe): Secondary | ICD-10-CM

## 2023-06-06 DIAGNOSIS — I5021 Acute systolic (congestive) heart failure: Secondary | ICD-10-CM | POA: Diagnosis not present

## 2023-06-06 DIAGNOSIS — K429 Umbilical hernia without obstruction or gangrene: Secondary | ICD-10-CM | POA: Diagnosis not present

## 2023-06-06 DIAGNOSIS — I272 Pulmonary hypertension, unspecified: Secondary | ICD-10-CM | POA: Diagnosis not present

## 2023-06-06 DIAGNOSIS — R0989 Other specified symptoms and signs involving the circulatory and respiratory systems: Secondary | ICD-10-CM | POA: Diagnosis not present

## 2023-06-06 DIAGNOSIS — N186 End stage renal disease: Secondary | ICD-10-CM | POA: Diagnosis not present

## 2023-06-06 DIAGNOSIS — Z79899 Other long term (current) drug therapy: Secondary | ICD-10-CM

## 2023-06-06 DIAGNOSIS — J811 Chronic pulmonary edema: Secondary | ICD-10-CM | POA: Diagnosis not present

## 2023-06-06 DIAGNOSIS — E8779 Other fluid overload: Secondary | ICD-10-CM | POA: Diagnosis not present

## 2023-06-06 DIAGNOSIS — I5043 Acute on chronic combined systolic (congestive) and diastolic (congestive) heart failure: Secondary | ICD-10-CM | POA: Diagnosis not present

## 2023-06-06 DIAGNOSIS — I517 Cardiomegaly: Secondary | ICD-10-CM | POA: Diagnosis not present

## 2023-06-06 DIAGNOSIS — I120 Hypertensive chronic kidney disease with stage 5 chronic kidney disease or end stage renal disease: Secondary | ICD-10-CM | POA: Diagnosis not present

## 2023-06-06 DIAGNOSIS — Z9049 Acquired absence of other specified parts of digestive tract: Secondary | ICD-10-CM | POA: Diagnosis not present

## 2023-06-06 DIAGNOSIS — Z91010 Allergy to peanuts: Secondary | ICD-10-CM

## 2023-06-06 DIAGNOSIS — N250 Renal osteodystrophy: Secondary | ICD-10-CM | POA: Diagnosis present

## 2023-06-06 DIAGNOSIS — D638 Anemia in other chronic diseases classified elsewhere: Secondary | ICD-10-CM | POA: Diagnosis not present

## 2023-06-06 DIAGNOSIS — I110 Hypertensive heart disease with heart failure: Secondary | ICD-10-CM

## 2023-06-06 LAB — CBC
HCT: 29.8 % — ABNORMAL LOW (ref 39.0–52.0)
HCT: 25.1 % — ABNORMAL LOW (ref 39.0–52.0)
Hemoglobin: 8.5 g/dL — ABNORMAL LOW (ref 13.0–17.0)
Hemoglobin: 7.6 g/dL — ABNORMAL LOW (ref 13.0–17.0)
MCH: 28.4 pg (ref 26.0–34.0)
MCH: 28.9 pg (ref 26.0–34.0)
MCHC: 28.5 g/dL — ABNORMAL LOW (ref 30.0–36.0)
MCHC: 30.3 g/dL (ref 30.0–36.0)
MCV: 99.7 fL (ref 80.0–100.0)
MCV: 95.4 fL (ref 80.0–100.0)
Platelets: 166 10*3/uL (ref 150–400)
Platelets: 151 10*3/uL (ref 150–400)
RBC: 2.99 MIL/uL — ABNORMAL LOW (ref 4.22–5.81)
RBC: 2.63 MIL/uL — ABNORMAL LOW (ref 4.22–5.81)
RDW: 13.2 % (ref 11.5–15.5)
RDW: 13.2 % (ref 11.5–15.5)
WBC: 6 10*3/uL (ref 4.0–10.5)
WBC: 5.5 10*3/uL (ref 4.0–10.5)
nRBC: 0 % (ref 0.0–0.2)
nRBC: 0 % (ref 0.0–0.2)

## 2023-06-06 LAB — BRAIN NATRIURETIC PEPTIDE: B Natriuretic Peptide: 638.4 pg/mL — ABNORMAL HIGH (ref 0.0–100.0)

## 2023-06-06 LAB — ECHOCARDIOGRAM COMPLETE
Calc EF: 35.7 %
MV M vel: 5.36 m/s
MV Peak grad: 114.9 mmHg
Radius: 0.6 cm
Single Plane A2C EF: 24.9 %
Single Plane A4C EF: 45.1 %

## 2023-06-06 LAB — RENAL FUNCTION PANEL
Albumin: 1.8 g/dL — ABNORMAL LOW (ref 3.5–5.0)
Anion gap: 11 (ref 5–15)
BUN: 88 mg/dL — ABNORMAL HIGH (ref 6–20)
CO2: 16 mmol/L — ABNORMAL LOW (ref 22–32)
Calcium: 7.6 mg/dL — ABNORMAL LOW (ref 8.9–10.3)
Chloride: 117 mmol/L — ABNORMAL HIGH (ref 98–111)
Creatinine, Ser: 11.74 mg/dL — ABNORMAL HIGH (ref 0.61–1.24)
GFR, Estimated: 5 mL/min — ABNORMAL LOW (ref >60)
Glucose, Bld: 82 mg/dL (ref 70–99)
Phosphorus: 6.5 mg/dL — ABNORMAL HIGH (ref 2.5–4.6)
Potassium: 6.4 mmol/L (ref 3.5–5.1)
Sodium: 144 mmol/L (ref 135–145)

## 2023-06-06 LAB — HIV ANTIBODY (ROUTINE TESTING W REFLEX): HIV Screen 4th Generation wRfx: NONREACTIVE

## 2023-06-06 LAB — POC OCCULT BLOOD, ED: Fecal Occult Bld: NEGATIVE

## 2023-06-06 LAB — BASIC METABOLIC PANEL
Anion gap: 9 (ref 5–15)
Anion gap: 6 (ref 5–15)
Anion gap: 10 (ref 5–15)
BUN: 101 mg/dL — ABNORMAL HIGH (ref 6–20)
BUN: 105 mg/dL — ABNORMAL HIGH (ref 6–20)
BUN: 102 mg/dL — ABNORMAL HIGH (ref 6–20)
CO2: 12 mmol/L — ABNORMAL LOW (ref 22–32)
CO2: 13 mmol/L — ABNORMAL LOW (ref 22–32)
CO2: 12 mmol/L — ABNORMAL LOW (ref 22–32)
Calcium: 7.8 mg/dL — ABNORMAL LOW (ref 8.9–10.3)
Calcium: 7.6 mg/dL — ABNORMAL LOW (ref 8.9–10.3)
Calcium: 7.6 mg/dL — ABNORMAL LOW (ref 8.9–10.3)
Chloride: 121 mmol/L — ABNORMAL HIGH (ref 98–111)
Chloride: 122 mmol/L — ABNORMAL HIGH (ref 98–111)
Chloride: 120 mmol/L — ABNORMAL HIGH (ref 98–111)
Creatinine, Ser: 13.07 mg/dL — ABNORMAL HIGH (ref 0.61–1.24)
Creatinine, Ser: 13.08 mg/dL — ABNORMAL HIGH (ref 0.61–1.24)
Creatinine, Ser: 12.82 mg/dL — ABNORMAL HIGH (ref 0.61–1.24)
GFR, Estimated: 4 mL/min — ABNORMAL LOW (ref >60)
GFR, Estimated: 4 mL/min — ABNORMAL LOW (ref >60)
GFR, Estimated: 4 mL/min — ABNORMAL LOW (ref >60)
Glucose, Bld: 85 mg/dL (ref 70–99)
Glucose, Bld: 79 mg/dL (ref 70–99)
Glucose, Bld: 71 mg/dL (ref 70–99)
Potassium: >7.5 mmol/L (ref 3.5–5.1)
Potassium: >7.5 mmol/L (ref 3.5–5.1)
Potassium: >7.5 mmol/L (ref 3.5–5.1)
Sodium: 142 mmol/L (ref 135–145)
Sodium: 141 mmol/L (ref 135–145)
Sodium: 142 mmol/L (ref 135–145)

## 2023-06-06 LAB — RESP PANEL BY RT-PCR (RSV, FLU A&B, COVID)  RVPGX2
Influenza A by PCR: NEGATIVE
Influenza B by PCR: NEGATIVE
Resp Syncytial Virus by PCR: NEGATIVE
SARS Coronavirus 2 by RT PCR: NEGATIVE

## 2023-06-06 LAB — IRON AND TIBC
Iron: 96 ug/dL (ref 45–182)
Iron: 99 ug/dL (ref 45–182)
Saturation Ratios: 38 % (ref 17.9–39.5)
Saturation Ratios: 42 % — ABNORMAL HIGH (ref 17.9–39.5)
TIBC: 253 ug/dL (ref 250–450)
TIBC: 238 ug/dL — ABNORMAL LOW (ref 250–450)
UIBC: 157 ug/dL
UIBC: 139 ug/dL

## 2023-06-06 LAB — TROPONIN I (HIGH SENSITIVITY)
Troponin I (High Sensitivity): 57 ng/L — ABNORMAL HIGH (ref <18)
Troponin I (High Sensitivity): 57 ng/L — ABNORMAL HIGH (ref <18)

## 2023-06-06 LAB — COOXEMETRY PANEL
Carboxyhemoglobin: 1.9 % — ABNORMAL HIGH (ref 0.5–1.5)
Methemoglobin: 1.9 % — ABNORMAL HIGH (ref 0.0–1.5)
O2 Saturation: 85.7 %
Total hemoglobin: <6.9 g/dL — CL (ref 12.0–16.0)

## 2023-06-06 LAB — FERRITIN
Ferritin: 526 ng/mL — ABNORMAL HIGH (ref 24–336)
Ferritin: 534 ng/mL — ABNORMAL HIGH (ref 24–336)

## 2023-06-06 LAB — MAGNESIUM: Magnesium: 1.7 mg/dL (ref 1.7–2.4)

## 2023-06-06 LAB — GLUCOSE, CAPILLARY
Glucose-Capillary: 68 mg/dL — ABNORMAL LOW (ref 70–99)
Glucose-Capillary: 92 mg/dL (ref 70–99)
Glucose-Capillary: 68 mg/dL — ABNORMAL LOW (ref 70–99)
Glucose-Capillary: 119 mg/dL — ABNORMAL HIGH (ref 70–99)
Glucose-Capillary: 74 mg/dL (ref 70–99)

## 2023-06-06 LAB — HEMOGLOBIN A1C
Hgb A1c MFr Bld: 4.2 % — ABNORMAL LOW (ref 4.8–5.6)
Mean Plasma Glucose: 73.84 mg/dL

## 2023-06-06 LAB — MRSA NEXT GEN BY PCR, NASAL: MRSA by PCR Next Gen: NOT DETECTED

## 2023-06-06 LAB — POTASSIUM: Potassium: 4.8 mmol/L (ref 3.5–5.1)

## 2023-06-06 MED ORDER — PERFLUTREN LIPID MICROSPHERE
1.0000 mL | INTRAVENOUS | Status: AC | PRN
Start: 2023-06-06 — End: 2023-06-06
  Administered 2023-06-06: 4 mL via INTRAVENOUS

## 2023-06-06 MED ORDER — SODIUM CHLORIDE 0.9% FLUSH: 10.0000 mL | INTRAVENOUS | Status: DC | PRN

## 2023-06-06 MED ORDER — SODIUM BICARBONATE 8.4 % IV SOLN
100.0000 meq | Freq: Once | INTRAVENOUS | Status: AC
  Administered 2023-06-06: 100 meq via INTRAVENOUS
  Filled 2023-06-06: qty 50

## 2023-06-06 MED ORDER — DOCUSATE SODIUM 100 MG PO CAPS: 100.0000 mg | ORAL_CAPSULE | Freq: Two times a day (BID) | ORAL | Status: DC | PRN

## 2023-06-06 MED ORDER — FUROSEMIDE 10 MG/ML IJ SOLN
80.0000 mg | Freq: Once | INTRAMUSCULAR | Status: AC
  Administered 2023-06-06: 80 mg via INTRAVENOUS
  Filled 2023-06-06: qty 8

## 2023-06-06 MED ORDER — POLYETHYLENE GLYCOL 3350 17 G PO PACK: 17.0000 g | PACK | Freq: Every day | ORAL | Status: DC | PRN

## 2023-06-06 MED ORDER — INSULIN ASPART 100 UNIT/ML IJ SOLN
0.0000 [IU] | Freq: Three times a day (TID) | INTRAMUSCULAR | Status: DC
  Filled 2023-06-06: qty 0.15

## 2023-06-06 MED ORDER — HEPARIN SODIUM (PORCINE) 1000 UNIT/ML DIALYSIS
1000.0000 [IU] | INTRAMUSCULAR | Status: DC | PRN
  Administered 2023-06-06: 3000 [IU] via INTRAVENOUS_CENTRAL
  Administered 2023-06-08: 2800 [IU] via INTRAVENOUS_CENTRAL
  Filled 2023-06-06: qty 6
  Filled 2023-06-06: qty 5
  Filled 2023-06-06: qty 6
  Filled 2023-06-06: qty 5
  Filled 2023-06-06: qty 3

## 2023-06-06 MED ORDER — DARBEPOETIN ALFA 150 MCG/0.3ML IJ SOSY
150.0000 ug | PREFILLED_SYRINGE | INTRAMUSCULAR | Status: DC
  Administered 2023-06-06 – 2023-06-13 (×2): 150 ug via SUBCUTANEOUS
  Filled 2023-06-06 (×3): qty 0.3

## 2023-06-06 MED ORDER — PRISMASOL BGK 2/3.5 32-2-3.5 MEQ/L EC SOLN: Status: DC

## 2023-06-06 MED ORDER — AMIODARONE LOAD VIA INFUSION
150.0000 mg | Freq: Once | INTRAVENOUS | Status: AC
  Administered 2023-06-06: 150 mg via INTRAVENOUS
  Filled 2023-06-06: qty 83.34

## 2023-06-06 MED ORDER — AMIODARONE HCL IN DEXTROSE 360-4.14 MG/200ML-% IV SOLN
30.0000 mg/h | INTRAVENOUS | Status: DC
  Administered 2023-06-07 – 2023-06-08 (×5): 30 mg/h via INTRAVENOUS
  Filled 2023-06-06 (×4): qty 200

## 2023-06-06 MED ORDER — CALCIUM GLUCONATE-NACL 1-0.675 GM/50ML-% IV SOLN
1.0000 g | Freq: Once | INTRAVENOUS | Status: AC
  Administered 2023-06-06: 1000 mg via INTRAVENOUS
  Filled 2023-06-06: qty 50

## 2023-06-06 MED ORDER — SODIUM ZIRCONIUM CYCLOSILICATE 10 G PO PACK
10.0000 g | PACK | Freq: Three times a day (TID) | ORAL | Status: DC
  Administered 2023-06-06 (×3): 10 g via ORAL
  Filled 2023-06-06 (×3): qty 1

## 2023-06-06 MED ORDER — INSULIN ASPART 100 UNIT/ML IV SOLN
5.0000 [IU] | Freq: Once | INTRAVENOUS | Status: AC
  Administered 2023-06-06: 5 [IU] via INTRAVENOUS
  Filled 2023-06-06: qty 0.05

## 2023-06-06 MED ORDER — CHLORHEXIDINE GLUCONATE CLOTH 2 % EX PADS
6.0000 | MEDICATED_PAD | Freq: Every day | CUTANEOUS | Status: DC
  Administered 2023-06-06 – 2023-06-10 (×6): 6 via TOPICAL

## 2023-06-06 MED ORDER — MAGNESIUM SULFATE 2 GM/50ML IV SOLN
2.0000 g | Freq: Once | INTRAVENOUS | Status: AC
  Administered 2023-06-06: 2 g via INTRAVENOUS
  Filled 2023-06-06: qty 50

## 2023-06-06 MED ORDER — HEPARIN SODIUM (PORCINE) 5000 UNIT/ML IJ SOLN
5000.0000 [IU] | Freq: Three times a day (TID) | INTRAMUSCULAR | Status: DC
  Administered 2023-06-06 – 2023-06-18 (×37): 5000 [IU] via SUBCUTANEOUS
  Filled 2023-06-06 (×37): qty 1

## 2023-06-06 MED ORDER — SODIUM CHLORIDE 0.9% FLUSH
10.0000 mL | Freq: Two times a day (BID) | INTRAVENOUS | Status: DC
Start: 2023-06-06 — End: 2023-06-18
  Administered 2023-06-06 – 2023-06-12 (×8): 10 mL
  Administered 2023-06-12: 3 mL
  Administered 2023-06-13 – 2023-06-17 (×7): 10 mL

## 2023-06-06 MED ORDER — AMIODARONE HCL IN DEXTROSE 360-4.14 MG/200ML-% IV SOLN
60.0000 mg/h | INTRAVENOUS | Status: AC
  Administered 2023-06-06 – 2023-06-07 (×2): 60 mg/h via INTRAVENOUS
  Filled 2023-06-06 (×2): qty 200

## 2023-06-06 MED ORDER — SODIUM CHLORIDE 0.9 % FOR CRRT: INTRAVENOUS_CENTRAL | Status: DC | PRN

## 2023-06-06 MED ORDER — STERILE WATER FOR INJECTION IV SOLN
INTRAVENOUS | Status: DC
  Filled 2023-06-06 (×3): qty 150

## 2023-06-06 MED ORDER — DEXTROSE 50 % IV SOLN
1.0000 | Freq: Once | INTRAVENOUS | Status: AC
  Administered 2023-06-06: 50 mL via INTRAVENOUS
  Filled 2023-06-06: qty 50

## 2023-06-06 MED ORDER — FUROSEMIDE 10 MG/ML IJ SOLN
160.0000 mg | Freq: Once | INTRAVENOUS | Status: AC
  Administered 2023-06-06: 160 mg via INTRAVENOUS
  Filled 2023-06-06: qty 10

## 2023-06-06 MED ORDER — INSULIN ASPART 100 UNIT/ML IJ SOLN
0.0000 [IU] | Freq: Every day | INTRAMUSCULAR | Status: DC
  Filled 2023-06-06: qty 0.05
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
## 2023-06-06 NOTE — Procedures (Signed)
 Central Venous Catheter Insertion Procedure Note  Rocklin Soderquist  [id number]  1970-11-27  Date:06/06/23  Time:11:56 AM   Provider Performing:Haruki Arnold Salena Saner Katrinka Blazing   Procedure: Insertion of Non-tunneled Central Venous Catheter(36556)with US guidance (25366)    Indication(s) Hemodialysis  Consent Risks of the procedure as well as the alternatives and risks of each were explained to the patient and/or caregiver.  Consent for the procedure was obtained and is signed in the bedside chart  Anesthesia Topical only with 1% lidocaine   Timeout Verified patient identification, verified procedure, site/side was marked, verified correct patient position, special equipment/implants available, medications/allergies/relevant history reviewed, required imaging and test results available.  Sterile Technique Maximal sterile technique including full sterile barrier drape, hand hygiene, sterile gown, sterile gloves, mask, hair covering, sterile ultrasound probe cover (if used).  Procedure Description Area of catheter insertion was cleaned with chlorhexidine and draped in sterile fashion.   With real-time ultrasound guidance a HD catheter was placed into the left internal jugular vein.  Nonpulsatile blood flow and easy flushing noted in all ports.  The catheter was sutured in place and sterile dressing applied.  Complications/Tolerance None; patient tolerated the procedure well. Chest X-ray is ordered to verify placement for internal jugular or subclavian cannulation.  Chest x-ray is not ordered for femoral cannulation.  EBL Minimal  Specimen(s) None

## 2023-06-06 NOTE — ED Provider Notes (Signed)
 [place] [facility] DE[facility]MENT AT [facility] Provider Note   CSN: [id number] Arrival date & time: 06/06/23  0243     History  Chief Complaint  Patient presents with   Shortness of Breath    Jerry Edwards is a 53 y.o. male.  This is a 53 year old male seen Presenting emergency department for breath.  Reports worsening edema over the past week, some mild worsening on exertion.  Shortness of breath this evening at midnight.  Does wear oxygen 2 L as needed, typically does not needed.  Oxygen with sleeping.  He also notes feeling generally well for the past several days with some loose bowel movements.  No abdominal pain.   Shortness of Breath      Home Medications Prior to Admission medications   Medication Sig Start Date End Date Taking? Authorizing Provider  aspirin 81 MG tablet Take 1 tablet (81 mg total) by mouth daily. 04/21/13   Vassie Loll, MD  azithromycin (ZITHROMAX Z-PAK) 250 MG tablet 2 po day one, then 1 daily x 4 days 05/06/23   Fayrene Helper, PA-C  benzonatate (TESSALON) 100 MG capsule Take 1 capsule (100 mg total) by mouth every 8 (eight) hours. 05/06/23   Fayrene Helper, PA-C  carvedilol (COREG) 12.5 MG tablet TAKE 1 TABLET BY MOUTH TWICE DAILY WITH A MEAL 03/26/23   Bensimhon, Bevelyn Buckles, MD  doxazosin (CARDURA) 2 MG tablet TAKE 1 TABLET BY MOUTH AT BEDTIME 05/27/23   Bensimhon, Bevelyn Buckles, MD  empagliflozin (JARDIANCE) 10 MG TABS tablet Take 1 tablet by mouth once daily 04/14/23   Bensimhon, Bevelyn Buckles, MD  erythromycin ophthalmic ointment Place 1 application into the right eye at bedtime. 05/01/21   [provider]  furosemide (LASIX) 80 MG tablet Take 80 mg in and 40 mg in pm 01/21/23   Bensimhon, Bevelyn Buckles, MD  hydrALAZINE (APRESOLINE) 100 MG tablet Take 1 tablet (100 mg total) by mouth 3 (three) times daily. 01/28/23   Bensimhon, Bevelyn Buckles, MD  isosorbide mononitrate (IMDUR) 30 MG 24 hr tablet Take 1 tablet by mouth once daily 05/27/23   Bensimhon, Bevelyn Buckles, MD   Olopatadine HCl (PATADAY OP) Place 1 drop into both eyes in the morning and at bedtime.    [provider]  OXYGEN Inhale 2 L into the lungs as needed (shortness of breath).    [provider]  potassium chloride SA (KLOR-CON M) 20 MEQ tablet Take 2 tablets by mouth once daily 12/08/22   Andrey Farmer, PA-C      Allergies    Fish allergy, Other, Peanuts [peanut oil], Shellfish allergy, Latex, and Pork-derived products    Review of Systems   Review of Systems  Respiratory:  Positive for shortness of breath.     Physical Exam Updated Vital Signs BP 133/78   Pulse 78   Temp 98.2 F (36.8 C) (Oral)   Resp 16   SpO2 99%  Physical Exam Vitals and nursing note reviewed.  Constitutional:      General: He is not in acute distress.    Appearance: He is not toxic-appearing.  HENT:     Head: Normocephalic and atraumatic.  Cardiovascular:     Rate and Rhythm: Normal rate and regular rhythm.  Pulmonary:     Effort: Tachypnea and accessory muscle usage present.     Breath sounds: No wheezing or rhonchi.  Musculoskeletal:     Cervical back: Normal range of motion.     Right lower leg: Edema present.  Left lower leg: Edema present.  Skin:    General: Skin is warm.     Capillary Refill: Capillary refill takes less than 2 seconds.  Neurological:     Mental Status: He is alert and oriented to person, place, and time.  Psychiatric:        Mood and Affect: Mood normal.        Behavior: Behavior normal.     ED Results / Procedures / Treatments   Labs (all labs ordered are listed, but only abnormal results are displayed) Labs Reviewed  CBC - Abnormal; Notable for the following components:      Result Value   RBC 2.99 (*)    Hemoglobin 8.5 (*)    HCT 29.8 (*)    MCHC 28.5 (*)    All other components within normal limits  BRAIN NATRIURETIC PEPTIDE - Abnormal; Notable for the following components:   B Natriuretic Peptide 638.4 (*)    All other  components within normal limits  TROPONIN I (HIGH SENSITIVITY) - Abnormal; Notable for the following components:   Troponin I (High Sensitivity) 57 (*)    All other components within normal limits  TROPONIN I (HIGH SENSITIVITY) - Abnormal; Notable for the following components:   Troponin I (High Sensitivity) 57 (*)    All other components within normal limits  RESP PANEL BY RT-PCR (RSV, FLU A&B, COVID)  RVPGX2  BASIC METABOLIC PANEL  BASIC METABOLIC PANEL  POC OCCULT BLOOD, ED    EKG EKG Interpretation Date/Time:  Sunday June 06 2023 02:56:50 EDT Ventricular Rate:  77 PR Interval:  189 QRS Duration:  153 QT Interval:  416 QTC Calculation: 471 R Axis:   -19  Text Interpretation: Sinus rhythm IVCD, consider atypical LBBB Confirmed by Estanislado Pandy [phone]) on 06/06/2023 4:58:14 AM  Radiology DG Chest Portable 1 View Result Date: 06/06/2023 CLINICAL DATA:  CHF, pulmonary edema/fluid overload, shortness of breath EXAM: PORTABLE CHEST 1 VIEW COMPARISON:  05/06/2023 FINDINGS: Marked cardiomegaly. Pulmonary vascular congestion. Evaluation is limited by body habitus. Probable airspace opacities in the left lower lobe. No definite pleural effusion or pneumothorax. IMPRESSION: 1. Marked cardiomegaly with pulmonary vascular congestion. 2. Probable left lower lobe airspace disease. Electronically Signed   By: Minerva Fester M.D.   On: 06/06/2023 03:30    Procedures Procedures    Medications Ordered in ED Medications  furosemide (LASIX) injection 80 mg (80 mg Intravenous Given 06/06/23 0448)    ED Course/ Medical Decision Making/ A&P Clinical Course as of 06/06/23 0657  Sun Jun 06, 2023  0301 Saw cardiology 01/19/23:52 y.o. male with history of NICM, Chronic systolic heart failure, HTN, OSA, and osteogenesis imperfecta.    Admitted 1/15 with HF symptoms. ECHO 04/08/2013. EF 25%. cMRI 12/15 EF 38% possible infiltrative disease with discrete areas of non-coronary pattern delayed  enhancement. Coronaries were normal on LHC.  SPEP no M spike and UPEP no free light chains.    PYP scan 8/18 with ratio of 1.2 (felt to be negative) - unable to have MRI (couldnt fit)   Admitted 6/19 with ADHF. Echo showed lower EF 25-30%. HF medications optimized. DC weight: 216 lbs   Sleep study 8/19 AHI 88/hr   Admitted 11/22 Echo showed EF 35-40%. Hstrop 42-> 38   Seen 02/14/21 at [facility] for psychotic episode. He became combative and left AMA.   Admitted 3/23 for a/c CHF and AKI. Sherryll Burger and Perry held. Diuresed w/ IV Lasix. Echo EF 30-35%. RV normal. New mitral regurgitation that appeared  severe by echo.  RHC showed mild volume overload, moderate mixed pulmonary HTN, no evidence of significant v-waves in PCWP tracing, high cardiac output. TEE: Mild P2 restriction with 2-3+ (mod-severe central and posterior MR). Findings were d/w structural heart team. Valve is amenable to clipping but not severe enough at this point. Plan to repeat echo in 6 months.  [TY]  0340 DG Chest Portable 1 View IMPRESSION: 1. Marked cardiomegaly with pulmonary vascular congestion. 2. Probable left lower lobe airspace disease.   [TY]  6045 Labs with elevated BNP; consistent with heart failure/fluid overload picture.  Troponin elevated, chronically so and appears to be at his baseline.  Flu/COVID/RSV negative.  His chest x-ray with pulmonary congestion, questionable left lower lobe pneumonia.  Not having fevers chills, cough.  No white count.  History most consistent with CHF rather than pneumonia. Will hold off on antibiotics for now. His CBC also showed a new anemia, no overt sources of blood loss.  Occult blood negative.  Basic metabolic panel hemolyzed and is pending.  Received Lasix, still endorsing shortness of breath.  Given his complex past medical history will plan for admission [TY]    Clinical Course User Index [TY] Coral Spikes, DO                                 Medical Decision Making Is a  53 year old male, history, active, chest, pulmonary hypertension, morbid obesity, wheelchair-bound, many medications and complaints presenting emergency department with acute onset shortness of breath in the setting of worsening lower extremity edema, and dyspnea on exertion.  History concerning for CHF vs pneumonia.  He is afebrile, nontachycardic, slightly hypertensive.  He is maintaining his oxygen saturation on room air, placed on 2 L nasal cannula which is what he takes at home, does have significant work of breathing/tachypnea.  Lungs.  Coarse breath sounds, but not overt pulmonary edema.  Mother notes that he has been feeling well for the past several days.  Patient notes that he had decreased Lasix by cardiology last visit.  Will get screening labs and chest x-ray.  History not consistent with PE.  See ED course for final MDM/disposition.  Amount and/or Complexity of Data Reviewed External Data Reviewed:     Details: See ED course Labs: ordered. Decision-making details documented in ED Course. Radiology: ordered. Decision-making details documented in ED Course. ECG/medicine tests: ordered. Decision-making details documented in ED Course.  Risk Prescription drug management. Decision regarding hospitalization.           Final Clinical Impression(s) / ED Diagnoses Final diagnoses:  Acute on chronic congestive heart failure, unspecified heart failure type [facility])    Rx / DC Orders ED Discharge Orders     None         Coral Spikes, DO 06/06/23 [phone]

## 2023-06-06 NOTE — ED Triage Notes (Signed)
 Pt reports feeling unwell and SOB, hx of CHF, on lasix , MD reduced dose 5 months ago. Pt reports over the past few days increased SOB and swelling to extremities. Pt labored in triage on 2L [place] @ baseline

## 2023-06-06 NOTE — Progress Notes (Addendum)
 eLink Physician-Brief Progress Note Patient Name: Jerry Edwards DOB: [birth year] MRN: [id number]   Date of Service  06/06/2023  HPI/Events of Note  Has known OSA  eICU Interventions  Home CPAP   2223 -patient is relatively asymptomatic but keeps coming into prolonged runs of VT.    Blood pressure stable.  Will bolus and load on amiodarone.  Currently on continuous renal replacement therapy.  Potassium improved to 4.8 from 6.4.  Will add on magnesium.  2320 -had initially returned blood on CRRT but given hemodynamic stability since bolus and maintenance of amiodarone, will resume CRRT.  Magnesium has been supplemented.  Intervention Category Minor Interventions: Routine modifications to care plan (e.g. PRN medications for pain, fever)  Ashantia Amaral 06/06/2023, 8:21 PM

## 2023-06-06 NOTE — ED Provider Notes (Signed)
 Physical Exam  BP 135/84 (BP Location: Left Arm)   Pulse 87   Temp 98.2 F (36.8 C) (Oral)   Resp 18   SpO2 96%   Physical Exam Constitutional:      General: He is not in acute distress.    Appearance: Normal appearance.  HENT:     Head: Normocephalic and atraumatic.     Nose: No congestion or rhinorrhea.  Eyes:     General:        Right eye: No discharge.        Left eye: No discharge.     Extraocular Movements: Extraocular movements intact.     Pupils: Pupils are equal, round, and reactive to light.  Cardiovascular:     Rate and Rhythm: Normal rate and regular rhythm.     Heart sounds: No murmur heard. Pulmonary:     Effort: No respiratory distress.     Breath sounds: No wheezing or rales.  Abdominal:     General: There is no distension.     Tenderness: There is no abdominal tenderness.  Musculoskeletal:        General: Normal range of motion.     Cervical back: Normal range of motion.  Skin:    General: Skin is warm and dry.  Neurological:     General: No focal deficit present.     Mental Status: He is alert.     Procedures  .Critical Care  Performed by: Glendora Score, MD Authorized by: Glendora Score, MD   Critical care provider statement:    Critical care time (minutes):  30   Critical care was necessary to treat or prevent imminent or life-threatening deterioration of the following conditions:  Renal failure   Critical care was time spent personally by me on the following activities:  Development of treatment plan with patient or surrogate, discussions with consultants, evaluation of patient's response to treatment, examination of patient, ordering and review of laboratory studies, ordering and review of radiographic studies, ordering and performing treatments and interventions, pulse oximetry, re-evaluation of patient's condition and review of old charts   ED Course / MDM   Clinical Course as of 06/06/23 1356  Sun Jun 06, 2023  0301 Saw cardiology  01/19/23:53 y.o. male with history of NICM, Chronic systolic heart failure, HTN, OSA, and osteogenesis imperfecta.    Admitted 1/15 with HF symptoms. ECHO 04/08/2013. EF 25%. cMRI 12/15 EF 38% possible infiltrative disease with discrete areas of non-coronary pattern delayed enhancement. Coronaries were normal on LHC.  SPEP no M spike and UPEP no free light chains.    PYP scan 8/18 with ratio of 1.2 (felt to be negative) - unable to have MRI (couldnt fit)   Admitted 6/19 with ADHF. Echo showed lower EF 25-30%. HF medications optimized. DC weight: 216 lbs   Sleep study 8/19 AHI 88/hr   Admitted 11/22 Echo showed EF 35-40%. Hstrop 42-> 38   Seen 02/14/21 at [facility] for psychotic episode. He became combative and left AMA.   Admitted 3/23 for a/c CHF and AKI. Jerry Edwards and Jerry Edwards held. Diuresed w/ IV Lasix. Echo EF 30-35%. RV normal. New mitral regurgitation that appeared severe by echo.  RHC showed mild volume overload, moderate mixed pulmonary HTN, no evidence of significant v-waves in PCWP tracing, high cardiac output. TEE: Mild P2 restriction with 2-3+ (mod-severe central and posterior MR). Findings were d/w structural heart team. Valve is amenable to clipping but not severe enough at this point. Plan to repeat echo in 6  months.  [TY]  0340 DG Chest Portable 1 View IMPRESSION: 1. Marked cardiomegaly with pulmonary vascular congestion. 2. Probable left lower lobe airspace disease.   [TY]  1660 Labs with elevated BNP; consistent with heart failure/fluid overload picture.  Troponin elevated, chronically so and appears to be at his baseline.  Flu/COVID/RSV negative.  His chest x-ray with pulmonary congestion, questionable left lower lobe pneumonia.  Not having fevers chills, cough.  No white count.  History most consistent with CHF rather than pneumonia. Will hold off on antibiotics for now. His CBC also showed a new anemia, no overt sources of blood loss.  Occult blood negative.  Basic metabolic panel  hemolyzed and is pending.  Received Lasix, still endorsing shortness of breath.  Given his complex past medical history will plan for admission [TY]  0842 Hemoglobin A1c [MK]    Clinical Course User Index [MK] Jerry Edwards, [name], MD [TY] Jerry Spikes, DO   Medical Decision Making Amount and/or Complexity of Data Reviewed Labs: ordered. Decision-making details documented in ED Course. Radiology: ordered. Decision-making details documented in ED Course. ECG/medicine tests: ordered.  Risk OTC drugs. Prescription drug management. Decision regarding hospitalization.   Patient received in handoff.  CHF exacerbation pending hospital admission.  Did have issues with multiple previous BMPs hemolyzing and chemistries are pending.  I initially did admit the patient to the hospitalist regards patient's BMP resulted highly concerning.  We did send a follow-up confirmatory test but he has a severe AKI with BUN 102, creatinine 12.82, potassium greater than 7.5.  Patient given calcium, bicarb, insulin, glucose.  CT stone study without evidence of bilateral renal stones or hydronephrosis.  Spoke with Dr. Kathrene Bongo of renal who is recommending 160 of Lasix and at [facility] ICU admission.  Spoke with the ICU who accepted the patient for admission at [facility].       Glendora Score, MD 06/06/23 [phone]

## 2023-06-06 NOTE — H&P (Signed)
 NAME:  Jerry Edwards, MRN:  [id number], DOB:  1971/01/30, LOS: 0 ADMISSION DATE:  06/06/2023, CONSULTATION DATE:  06/06/2023  REFERRING MD:  ED, CHIEF COMPLAINT: Shortness of breath, cramps  History of Present Illness:  53 year old obese man, wheelchair-bound, chronic systolic heart failure brought in by parents to ED due to feeling unwell, increased shortness of breath and swelling of his hands and feet, decreased urine output for 2 weeks.  Uses 2 L nasal cannula intermittently at baseline. Lasix dose was decreased from 80 twice daily to 80/40 on 01/21/2023 and BUN/creatinine increased from 47/4.3-50/4.8 on 02/04/2023  ED labs significant for acute renal failure with BUN/creatinine 102/12.8 and potassium more than 7.5 He was treated with 1 round of insulin/D50, bicarbonate calcium gluconate and Lokelma. Lasix 160 mg was given Chest x-ray shows cardiomegaly with pulm vascular congestion. CT renal study shows small bilateral effusions multiple compression fractures of his spine.  He is very functional at home, lives with parents but is able to take care of all activities of daily living although wheelchair-bound.  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 rate 14, SpO2 96%.       No intake or output data in the 24 hours ending 06/06/23 0929 There were no vitals filed for this visit.  Examination: General: Obese man, lying supine, no distress HENT: Short neck, no JVD,  moist mucosa Lungs: Decreased breath sounds bilateral, no accessory muscle use Cardiovascular: S1-S2 distant, no rub Abdomen: Soft, obese, nontender, right cholecystectomy scar Extremities: 1+ edema, chronic deformities Neuro: Alert, oriented, nonfocal, no asterixis   Labs as above EKG shows LBBB which is baseline  Resolved Hospital Problem list     Assessment & Plan:  Seems to have developed progressive renal failure over the last few weeks/months.   AKI on CKD stage IV -unfortunately no reversible cause identified, likely has progressive renal disease or ATN Hyperkalemia-has been treated by cocktail.  He was on potassium supplements.  Can repeat BMET and if not improved, he will likely need dialysis.  I explained this to patient and mom and dad at bedside, they would be willing to proceed if needed.  HFrEF -hydralazine/nitrates/Coreg combination  OSA -nocturnal CPAP  Plan is to transfer him to [facility] where CHF service and renal can see him.    Best Practice (right click and "Reselect all SmartList Selections" daily)   Diet/type: Regular consistency (see orders) DVT prophylaxis prophylactic heparin  Pressure ulcer(s): N/A GI prophylaxis: N/A Lines: N/A Foley:  N/A Code Status:  full code Last date of multidisciplinary goals of care discussion [NA]  Labs   CBC: Recent Labs  Lab 06/06/23 0311  WBC 6.0  HGB 8.5*  HCT 29.8*  MCV 99.7  PLT 166    Basic Metabolic Panel: Recent Labs  Lab 06/06/23 0453 06/06/23 0559 06/06/23 0720  NA 142 141 142  K >7.5* >7.5* >7.5*  CL 121* 122* 120*  CO2 12* 13* 12*  GLUCOSE 85 79 71  BUN 101* 105* 102*  CREATININE 13.07* 13.08* 12.82*  CALCIUM 7.8* 7.6* 7.6*   GFR: CrCl cannot be calculated (Unknown ideal weight.). Recent Labs  Lab 06/06/23 0311  WBC 6.0    Liver Function Tests: No results for input(s): "AST", "ALT", "ALKPHOS", "BILITOT", "PROT", "ALBUMIN" in the last 168 hours. No results for input(s):  "LIPASE", "AMYLASE" in the last 168 hours. No results for input(s): "AMMONIA" in the last 168 hours.  ABG    Component Value Date/Time   PHART 7.309 (L) 02/21/2016 1025   PCO2ART 72.6 (HH) 02/21/2016 1025   PO2ART 134 (H) 02/21/2016 1025   HCO3 28.8 (H) 06/04/2021 0918   TCO2 30 06/04/2021 0918   O2SAT 75 06/04/2021 0918     Coagulation Profile: No results for input(s): "INR", "PROTIME" in the last 168 hours.  Cardiac Enzymes: No results for input(s): "CKTOTAL", "CKMB", "CKMBINDEX", "TROPONINI" in the last 168 hours.  HbA1C: Hgb A1c MFr Bld  Date/Time Value Ref Range Status  06/06/2023 07:20 AM 4.2 (L) 4.8 - 5.6 % Final    Comment:    (NOTE) Pre diabetes:          5.7%-6.4%  Diabetes:              >6.4%  Glycemic control for   <7.0% adults with diabetes   06/02/2021 03:27 PM 4.8 4.8 - 5.6 % Final    Comment:    (NOTE)         Prediabetes: 5.7 - 6.4         Diabetes: >6.4         Glycemic control for adults with diabetes: <7.0     CBG: No results for input(s): "GLUCAP" in the last 168 hours.  Review of Systems:   Decreased urine output Generalized weakness Hand and feet swelling   Past Medical History:  He,  has a past medical history of Bone fracture, Chronic systolic CHF (congestive heart failure) (HCC), Hypertension, Morbid obesity (HCC), NICM (nonischemic cardiomyopathy) (HCC) (02/22/2016), Noncompliance with medication regimen, Osteogenesis imperfecta, Pulmonary hypertension (HCC), and Wheelchair bound.   Surgical History:   Past Surgical History:  Procedure Laterality Date   CHOLECYSTECTOMY     LEFT AND RIGHT HEART CATHETERIZATION WITH CORONARY ANGIOGRAM N/A 04/12/2013   Procedure: LEFT AND RIGHT HEART CATHETERIZATION WITH CORONARY ANGIOGRAM;  Surgeon: Iran Ouch, MD;  Location: [facility] CATH LAB;  Service: Cardiovascular;  Laterality: N/A;   LEG SURGERY     rods in both legs   RIGHT HEART CATH N/A 06/04/2021   Procedure: RIGHT HEART CATH;  Surgeon:  Dolores Patty, MD;  Location: [facility] INVASIVE CV LAB;  Service: Cardiovascular;  Laterality: N/A;   TEE WITHOUT CARDIOVERSION N/A 06/05/2021   Procedure: TRANSESOPHAGEAL ECHOCARDIOGRAM (TEE);  Surgeon: Dolores Patty, MD;  Location: [facility] ENDOSCOPY;  Service: Cardiovascular;  Laterality: N/A;     Social History:   reports that he has quit smoking. He has never used smokeless tobacco. He reports current alcohol use. He reports current drug use. Drug: Marijuana.   Family History:  His family history includes Cancer in his father; Diabetes in his brother, maternal aunt, and maternal grandmother; Hypertension in his mother; Lung cancer in his father; Stroke in his brother and maternal grandmother. There is no history of Heart attack.   Allergies Allergies  Allergen Reactions   Fish Allergy Anaphylaxis   Other Anaphylaxis and Other (See Comments)    NO NUTS!!!!!! Peanut are legumes!!   "No red meat- Does not eat"   Peanuts [Peanut Oil]  Anaphylaxis   Shellfish Allergy Anaphylaxis   Latex Itching   Pork-Derived Products Other (See Comments)    Does not eat- religious reasons     Home Medications  Prior to Admission medications   Medication Sig Start Date End Date Taking? Authorizing Provider  aspirin 81 MG tablet Take 1 tablet (81 mg total) by mouth daily. 04/21/13  Yes Vassie Loll, MD  carvedilol (COREG) 12.5 MG tablet TAKE 1 TABLET BY MOUTH TWICE DAILY WITH A MEAL 03/26/23  Yes Bensimhon, Bevelyn Buckles, MD  doxazosin (CARDURA) 2 MG tablet TAKE 1 TABLET BY MOUTH AT BEDTIME 05/27/23  Yes Bensimhon, Bevelyn Buckles, MD  empagliflozin (JARDIANCE) 10 MG TABS tablet Take 1 tablet by mouth once daily 04/14/23  Yes Bensimhon, Bevelyn Buckles, MD  furosemide (LASIX) 80 MG tablet Take 80 mg in and 40 mg in pm 01/21/23  Yes Bensimhon, Bevelyn Buckles, MD  hydrALAZINE (APRESOLINE) 100 MG tablet Take 1 tablet (100 mg total) by mouth 3 (three) times daily. 01/28/23  Yes Bensimhon, Bevelyn Buckles, MD  isosorbide mononitrate (IMDUR)  30 MG 24 hr tablet Take 1 tablet by mouth once daily 05/27/23  Yes Bensimhon, Bevelyn Buckles, MD  Olopatadine HCl (PATADAY OP) Place 1 drop into both eyes in the morning and at bedtime.   Yes [provider]  OXYGEN Inhale 2 L into the lungs as needed (shortness of breath).   Yes [provider]  potassium chloride SA (KLOR-CON M) 20 MEQ tablet Take 2 tablets by mouth once daily 12/08/22  Yes Andrey Farmer, PA-C  azithromycin (ZITHROMAX Z-PAK) 250 MG tablet 2 po day one, then 1 daily x 4 days Patient not taking: Reported on 06/06/2023 05/06/23   Fayrene Helper, PA-C  benzonatate (TESSALON) 100 MG capsule Take 1 capsule (100 mg total) by mouth every 8 (eight) hours. Patient not taking: Reported on 06/06/2023 05/06/23   Fayrene Helper, PA-C  erythromycin ophthalmic ointment Place 1 application into the right eye at bedtime. Patient not taking: Reported on 06/06/2023 05/01/21   [provider]      Cyril Mourning MD. FCCP.  Pulmonary & Critical care Pager : [phone]  If no response to pager , please call [phone] until 7 pm After 7:00 pm call Elink  [phone]   06/06/2023
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
## 2023-06-07 DIAGNOSIS — I5043 Acute on chronic combined systolic (congestive) and diastolic (congestive) heart failure: Secondary | ICD-10-CM | POA: Diagnosis not present

## 2023-06-07 DIAGNOSIS — G4733 Obstructive sleep apnea (adult) (pediatric): Secondary | ICD-10-CM | POA: Diagnosis not present

## 2023-06-07 DIAGNOSIS — N184 Chronic kidney disease, stage 4 (severe): Secondary | ICD-10-CM | POA: Diagnosis not present

## 2023-06-07 DIAGNOSIS — I5023 Acute on chronic systolic (congestive) heart failure: Secondary | ICD-10-CM | POA: Diagnosis not present

## 2023-06-07 DIAGNOSIS — D638 Anemia in other chronic diseases classified elsewhere: Secondary | ICD-10-CM | POA: Diagnosis not present

## 2023-06-07 LAB — RENAL FUNCTION PANEL
Albumin: 1.6 g/dL — ABNORMAL LOW (ref 3.5–5.0)
Albumin: 1.6 g/dL — ABNORMAL LOW (ref 3.5–5.0)
Anion gap: 7 (ref 5–15)
Anion gap: 6 (ref 5–15)
BUN: 45 mg/dL — ABNORMAL HIGH (ref 6–20)
BUN: 31 mg/dL — ABNORMAL HIGH (ref 6–20)
CO2: 23 mmol/L (ref 22–32)
CO2: 24 mmol/L (ref 22–32)
Calcium: 7.4 mg/dL — ABNORMAL LOW (ref 8.9–10.3)
Calcium: 7.5 mg/dL — ABNORMAL LOW (ref 8.9–10.3)
Chloride: 107 mmol/L (ref 98–111)
Chloride: 106 mmol/L (ref 98–111)
Creatinine, Ser: 6.4 mg/dL — ABNORMAL HIGH (ref 0.61–1.24)
Creatinine, Ser: 4.81 mg/dL — ABNORMAL HIGH (ref 0.61–1.24)
GFR, Estimated: 10 mL/min — ABNORMAL LOW (ref >60)
GFR, Estimated: 14 mL/min — ABNORMAL LOW (ref >60)
Glucose, Bld: 144 mg/dL — ABNORMAL HIGH (ref 70–99)
Glucose, Bld: 114 mg/dL — ABNORMAL HIGH (ref 70–99)
Phosphorus: 4.2 mg/dL (ref 2.5–4.6)
Phosphorus: 3.5 mg/dL (ref 2.5–4.6)
Potassium: 4 mmol/L (ref 3.5–5.1)
Potassium: 4.1 mmol/L (ref 3.5–5.1)
Sodium: 137 mmol/L (ref 135–145)
Sodium: 136 mmol/L (ref 135–145)

## 2023-06-07 LAB — GLUCOSE, CAPILLARY
Glucose-Capillary: 68 mg/dL — ABNORMAL LOW (ref 70–99)
Glucose-Capillary: 66 mg/dL — ABNORMAL LOW (ref 70–99)
Glucose-Capillary: 78 mg/dL (ref 70–99)
Glucose-Capillary: 142 mg/dL — ABNORMAL HIGH (ref 70–99)
Glucose-Capillary: 100 mg/dL — ABNORMAL HIGH (ref 70–99)
Glucose-Capillary: 98 mg/dL (ref 70–99)

## 2023-06-07 LAB — COOXEMETRY PANEL
Carboxyhemoglobin: 1.5 % (ref 0.5–1.5)
Methemoglobin: <0.7 % (ref 0.0–1.5)
O2 Saturation: 68.7 %
Total hemoglobin: 7.5 g/dL — ABNORMAL LOW (ref 12.0–16.0)

## 2023-06-07 LAB — BASIC METABOLIC PANEL
Anion gap: 7 (ref 5–15)
BUN: 44 mg/dL — ABNORMAL HIGH (ref 6–20)
CO2: 23 mmol/L (ref 22–32)
Calcium: 7.5 mg/dL — ABNORMAL LOW (ref 8.9–10.3)
Chloride: 108 mmol/L (ref 98–111)
Creatinine, Ser: 6.39 mg/dL — ABNORMAL HIGH (ref 0.61–1.24)
GFR, Estimated: 10 mL/min — ABNORMAL LOW (ref >60)
Glucose, Bld: 141 mg/dL — ABNORMAL HIGH (ref 70–99)
Potassium: 4 mmol/L (ref 3.5–5.1)
Sodium: 138 mmol/L (ref 135–145)

## 2023-06-07 LAB — CBC
HCT: 23.3 % — ABNORMAL LOW (ref 39.0–52.0)
Hemoglobin: 7.1 g/dL — ABNORMAL LOW (ref 13.0–17.0)
MCH: 29.2 pg (ref 26.0–34.0)
MCHC: 30.5 g/dL (ref 30.0–36.0)
MCV: 95.9 fL (ref 80.0–100.0)
Platelets: 134 10*3/uL — ABNORMAL LOW (ref 150–400)
RBC: 2.43 MIL/uL — ABNORMAL LOW (ref 4.22–5.81)
RDW: 13 % (ref 11.5–15.5)
WBC: 5.6 10*3/uL (ref 4.0–10.5)
nRBC: 0 % (ref 0.0–0.2)

## 2023-06-07 LAB — MAGNESIUM
Magnesium: 1.9 mg/dL (ref 1.7–2.4)
Magnesium: 2 mg/dL (ref 1.7–2.4)

## 2023-06-07 LAB — PHOSPHORUS: Phosphorus: 4 mg/dL (ref 2.5–4.6)

## 2023-06-07 MED ORDER — PROSOURCE PLUS PO LIQD
30.0000 mL | Freq: Two times a day (BID) | ORAL | Status: DC
  Administered 2023-06-07 – 2023-06-18 (×12): 30 mL via ORAL
  Filled 2023-06-07 (×14): qty 30

## 2023-06-07 MED ORDER — RENA-VITE PO TABS
1.0000 | ORAL_TABLET | Freq: Every day | ORAL | Status: DC
  Administered 2023-06-08 – 2023-06-17 (×10): 1 via ORAL
  Filled 2023-06-07 (×10): qty 1

## 2023-06-07 MED ORDER — PRISMASOL BGK 4/2.5 32-4-2.5 MEQ/L EC SOLN
Status: DC
Start: 2023-06-07 — End: 2023-06-09

## 2023-06-07 MED ORDER — INSULIN ASPART 100 UNIT/ML IJ SOLN: 0.0000 [IU] | Freq: Three times a day (TID) | INTRAMUSCULAR | Status: DC

## 2023-06-07 MED ORDER — RENA-VITE PO TABS
1.0000 | ORAL_TABLET | Freq: Every day | ORAL | Status: DC
  Administered 2023-06-07: 1
  Filled 2023-06-07: qty 1

## 2023-06-07 MED ORDER — PRISMASOL BGK 4/2.5 32-4-2.5 MEQ/L EC SOLN: Status: DC

## 2023-06-07 NOTE — Progress Notes (Signed)
 Heart Failure Navigator Progress Note  Assessed for Heart & Vascular TOC clinic readiness.  Patient does not meet criteria due to Advanced Heart Failure Team patient of Dr. Gala Romney. .   Navigator will sign off at this time.   Rhae Hammock, BSN, Scientist, clinical (histocompatibility and immunogenetics) Only

## 2023-06-07 NOTE — Progress Notes (Signed)
 Hypoglycemic Event  CBG: 68  Treatment: 4 oz juice/soda  Symptoms: None  Follow-up CBG: Time:0845 CBG Result:66  4 oz soda given  Follow-up CBG: Time:0859 CBG Result:78  Possible Reasons for Event: Unknown  Comments/MD notified: yes   Chesley Noon

## 2023-06-07 NOTE — Progress Notes (Signed)
 Pharmacy Electrolyte Replacement  Recent Labs:  Recent Labs    06/06/23 1532 06/06/23 1901  K 6.4* 4.8  MG  --  1.7  PHOS 6.5*  --   CREATININE 11.74*  --     Low Critical Values (K </= 2.5, Phos </= 1, Mg </= 1) Present: Mg = 1.7  MD Contacted: Dr. Delia Chimes  Plan: Replace with 2g IV x1 per elink protocol

## 2023-06-07 NOTE — TOC Initial Note (Signed)
 Transition of Care [facility]) - Initial/Assessment Note    Patient Details  Name: Jerry Edwards MRN: [id number] Date of Birth: June 23, 1970  Transition of Care [facility] CM/SW Contact:    Elliot Cousin, RN Phone Number: [phone] 06/07/2023, 5:38 PM  Clinical Narrative:                  TOC CM spoke to pt at bedside. States he lives at home with parents. Gave permission to speak to parents. Pt states he needs a new PCP. Will also need a new wheelchair. Has oxygen with [employer], he paid out of pocket for set up.   Will continue to follow for dc needs.    Expected Discharge Plan: Home/Self Care Barriers to Discharge: Continued Medical Work up   Patient Goals and CMS Choice Patient states their goals for this hospitalization and ongoing recovery are:: wants to remain independent          Expected Discharge Plan and Services   Discharge Planning Services: CM Consult   Living arrangements for the past 2 months: Single Family Home                                      Prior Living Arrangements/Services Living arrangements for the past 2 months: Single Family Home Lives with:: Parents Patient language and need for interpreter reviewed:: Yes Do you feel safe going back to the place where you live?: Yes      Need for Family Participation in Patient Care: Yes (Comment) Care giver support system in place?: Yes (comment) Current home services: DME (oxygen, CPAP, wheelchair) Criminal Activity/Legal Involvement Pertinent to Current Situation/Hospitalization: No - Comment as needed  Activities of Daily Living   ADL Screening (condition at time of admission) Independently performs ADLs?: Yes (appropriate for developmental age) Is the patient deaf or have difficulty hearing?: No Does the patient have difficulty seeing, even when wearing glasses/contacts?: No Does the patient have difficulty concentrating, remembering, or making decisions?: No  Permission  Sought/Granted Permission sought to share information with : Case Manager, Family Supports, PCP Permission granted to share information with : Yes, Verbal Permission Granted  Share Information with NAME: Bryar Dahms  Permission granted to share info w AGENCY: DME  Permission granted to share info w Relationship: mother  Permission granted to share info w Contact Information: [phone]  Emotional Assessment Appearance:: Appears stated age Attitude/Demeanor/Rapport: Engaged Affect (typically observed): Accepting Orientation: : Oriented to Self, Oriented to Place, Oriented to  Time, Oriented to Situation   Psych Involvement: No (comment)  Admission diagnosis:  Acute CHF (congestive heart failure) (HCC) [I50.9] AKI (acute kidney injury) (HCC) [N17.9] Acute on chronic congestive heart failure, unspecified heart failure type [facility]) [I50.9] Patient Active Problem List   Diagnosis Date Noted   Acute on chronic congestive heart failure (HCC) 06/06/2023   AKI (acute kidney injury) (HCC) 06/06/2023   Mitral regurgitation 12/11/2021   HTN (hypertension) 06/03/2021   Acute kidney injury superimposed on chronic kidney disease (HCC) 06/03/2021   Type 2 diabetes mellitus (HCC) 06/03/2021   Osteogenesis imperfecta 06/03/2021   Class 3 obesity 06/03/2021   Encounter for psychological evaluation 03/08/2021   Chest pain 02/08/2021   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   Nutritional counseling 04/27/2018   Essential hypertension 09/20/2017   EKG abnormalities    Atypical chest pain 10/12/2016   Morbid obesity (HCC) 06/08/2016   Acute on chronic respiratory  failure with hypoxia (HCC) 03/05/2016   Obesity hypoventilation syndrome (HCC) 02/29/2016   Obstructive sleep apnea 02/29/2016   Respiratory acidosis    NICM (nonischemic cardiomyopathy) (HCC) 02/22/2016   Chronic systolic heart failure (HCC) 05/31/2015   Hypertensive heart disease 05/01/2015   Elevated troponin 03/28/2015   Acute on chronic  systolic heart failure (HCC) 04/08/2013   PCP:  Dolores Patty, MD Pharmacy:   [facility] [address], [place] - [address] [place] [address] Phone: [phone] Fax: [phone]     Social Drivers of Health (SDOH) Social History: SDOH Screenings   Food Insecurity: No Food Insecurity (06/06/2023)  Housing: Low Risk  (06/06/2023)  Transportation Needs: No Transportation Needs (06/06/2023)  Utilities: Not At Risk (06/06/2023)  Depression (PHQ2-9): Low Risk  (05/24/2019)  Physical Activity: Insufficiently Active (03/27/2022)  Social Connections: Patient Declined (06/06/2023)  Tobacco Use: Medium Risk (06/06/2023)   SDOH Interventions:     Readmission Risk Interventions     No data to display

## 2023-06-07 NOTE — Inpatient Diabetes Management (Signed)
 Inpatient Diabetes Program Recommendations  AACE/ADA: New Consensus Statement on Inpatient Glycemic Control (2015)  Target Ranges:  Prepandial:   less than 140 mg/dL      Peak postprandial:   less than 180 mg/dL (1-2 hours)      Critically ill patients:  140 - 180 mg/dL   Lab Results  Component Value Date   GLUCAP 68 (L) 06/07/2023   HGBA1C 4.2 (L) 06/06/2023    Review of Glycemic Control  Latest Reference Range & Units 06/06/23 15:52 06/06/23 16:32 06/06/23 21:50 06/07/23 08:21  Glucose-Capillary 70 - 99 mg/dL 68 (L) 409 (H) 74 68 (L)  (L): Data is abnormally low (H): Data is abnormally high Diabetes history: Type 2 DM (per chart review, no documented A1C) Outpatient Diabetes medications: none Current orders for Inpatient glycemic control: Novolog 0-15 units TID & HS  Inpatient Diabetes Program Recommendations:   Noted hypoglycemia this AM. No insulin administered. In the setting of renal status: Consider changing correction to Novolog 0-6 units TID.   Thanks, Lujean Rave, MSN, RNC-OB Diabetes Coordinator [phone] (8a-5p)

## 2023-06-07 NOTE — Progress Notes (Cosign Needed Addendum)
 Initial Nutrition Assessment  DOCUMENTATION CODES:  Not applicable  INTERVENTION:  Liberalize diet from heart healthy/carb modified to regular to increase options available and encourage intake that meets protein and calorie needs  Snacks BID between meals (2pm and 8pm) to encourage small frequent meals that help meet pt's protein needs  RenaVit daily to help repletion of water soluble vitamins lost in CRRT  Prosource BID daily to help meet increased protein needs  NUTRITION DIAGNOSIS:  Increased nutrient needs related to acute illness, chronic illness (acute- CRRT; chronic- CHF) as evidenced by estimated needs.  GOAL:  Patient will meet greater than or equal to 90% of their needs  MONITOR:  PO intake, Supplement acceptance, I & O's, Labs  REASON FOR ASSESSMENT:   Consult Assessment of nutrition requirement/status  ASSESSMENT:   Pt with hx of osteogenesis imperfecta, CHF, HTN and CKD IV who is wheelchair bound at baseline. Pt admitted after experiencing SOB, edema, and decreased UOP for 2 weeks, diagnosed AKI and acute on chronic heart failure.  Pt discussed during ICU rounds and with RN and MD. Pt currently on CRRT and will continue CRRT for 1-2 days. Pt will then need long-term renal replacement therapy.  Spoke with pt and pt's mom who was at bedside. Pt alert and able to answer questions. Pt reports his appetite has improved since being admitted and beginning CRRT. Pt reports he came in with lots of swelling which has improved, but edema still present based on physical exam. Pt reports since being admitted, he has eaten 100% of his meals and has no issues with n/v, constipation or diarrhea.  Pt reports PTA he was not feeling well for about 2 weeks. He reports nausea during that time and also was sick with the flu during this time. Pt's mom reports pt would only eat small amounts of soup or salad, but had very poor appetite in the last few weeks. Pt reports feeling much weaker  leading up to admission and decreased UOP.   Per nutrition focused physical exam, mild to moderate depletions seen in face with moderate depletions in thigh region, but pt still edematous so unable to assess majority of muscles and fat. Edema may be masking further depletions. Per chart review, pt's wt has been consistent since 12/2022, but cannot confirm if there was a presence of fluid when recent weights were taken. Will monitor trends while admitted and reassess physical exam once fluid is removed.  Discussed liberalizing diet to regular to provide more options for meals and snacks. Discussed how CRRT has helped bring phosphorus and potassium levels down to normal. Discussed protein needs and pt agreeable to trying prosource and having snacks BID in between meals to help meet higher needs and help prevent hypoglycemia (CBG last 24 hours 66-119mg /dL). Discussed adding a RenaVit to replenish water soluble vitamins lost through CRRT.  Admit weight: 103.2kg  Current weight: 103.2kg  UOP:  CRRT Output: 1,023mL (50 UF/hr)  Drains/Lines: HD Catheter L internal jugular  Peripheral IV R antecubital   Average Meal Intake: 3/16: 100% intake x 1 recorded meals  Nutritionally Relevant Medications: Magnesium sulfate Prismasol BGK 4 Aranesp (ordered, not given yet)  Labs Reviewed: BUN 45<--101 Creatinine 6.40<--13.08 Phosphorus 4.2<--6.5 Potassium 4.0<-- >7.5 CBG ranges from 66-119 mg/dL over the last 24 hours HgbA1c 4.2 Hgb 7.1   NUTRITION - FOCUSED PHYSICAL EXAM: Wheelchair bound at baseline; severe non-pitting edema present both BUE and BLE Flowsheet Row Most Recent Value  Orbital Region Mild depletion  Upper Arm  Region Unable to assess  Thoracic and Lumbar Region Unable to assess  Buccal Region No depletion  Temple Region Moderate depletion  Clavicle Bone Region Unable to assess  Clavicle and Acromion Bone Region Unable to assess  Scapular Bone Region Unable to assess   Dorsal Hand Unable to assess  Patellar Region Moderate depletion  Anterior Thigh Region Moderate depletion  Posterior Calf Region Unable to assess  Edema (RD Assessment) Severe  [non pitting]  Hair Reviewed  Eyes Reviewed  [ring around L eye, inverted eyelid on R]  Mouth Reviewed  Skin Reviewed  Nails Reviewed   Diet Order:   Diet Order             Diet regular Room service appropriate? Yes with Assist; Fluid consistency: Thin  Diet effective now                  EDUCATION NEEDS:  Education needs have been addressed  Skin:  Skin Assessment: Reviewed RN Assessment  Last BM:  3/17 type 6  Height:  Ht Readings from Last 1 Encounters:  06/07/23 4\' 11"  (1.499 m)   Weight:  Wt Readings from Last 1 Encounters:  06/07/23 103.2 kg   BMI:  Body mass index is 45.95 kg/m.  Estimated Nutritional Needs:   Kcal:  1800-2000  Protein:  100-125g  Fluid:  1L + UOP  Louis Meckel Dietetic Intern

## 2023-06-07 NOTE — Progress Notes (Signed)
 NAME:  Jerry Edwards, MRN:  [id number], DOB:  05-Apr-1970, LOS: 1 ADMISSION DATE:  06/06/2023, CONSULTATION DATE:  06/07/2023  REFERRING MD:  ED, CHIEF COMPLAINT: Shortness of breath, cramps  History of Present Illness:  53 year old obese man, wheelchair-bound, chronic systolic heart failure brought in by parents to ED due to feeling unwell, increased shortness of breath and swelling of his hands and feet, decreased urine output for 2 weeks.  Uses 2 L nasal cannula intermittently at baseline. Lasix dose was decreased from 80 twice daily to 80/40 on 01/21/2023 and BUN/creatinine increased from 47/4.3-50/4.8 on 02/04/2023  ED labs significant for acute renal failure with BUN/creatinine 102/12.8 and potassium more than 7.5 He was treated with 1 round of insulin/D50, bicarbonate calcium gluconate and Lokelma. Lasix 160 mg was given Chest x-ray shows cardiomegaly with pulm vascular congestion. CT renal study shows small bilateral effusions multiple compression fractures of his spine.  He is very functional at home, lives with parents but is able to take care of all activities of daily living although wheelchair-bound.  He sometimes works as a DJ  Pertinent  Medical History  Osteogenesis imperfecta -baseline wheelchair-bound HFrEF since 2015, EF 30 to 35% , moderate to severe MR OSA -Sleep study 8/19 AHI 88/hr  CKD-stage IV Brief psychotic episode 01/2021 LBBB  Significant Hospital Events: Including procedures, antibiotic start and stop dates in addition to other pertinent events   3/16 admitted with AoCKD, volume overload, hyperkalemia, acidosis> CRRT  Interim History / Subjective:  Asymptomatic prolonged runs of VT overnight> CRRT held briefly, bolused then amio gtt Remains on CRRT, UF goal -25ml/hr Mild pain in LE from swelling but otherwise no complaints CVP 10  Objective   Blood pressure 122/76, pulse 64, temperature 98.2 F (36.8 C), temperature source Axillary, resp. rate (!) 25,  height 4\' 11"  (1.499 m), weight 103.2 kg, SpO2 97%. CVP:  [9 mmHg-13 mmHg] 10 mmHg      Intake/Output Summary (Last 24 hours) at 06/07/2023 1002 Last data filed at 06/07/2023 0800 Gross per 24 hour  Intake 820.55 ml  Output 1619 ml  Net -798.45 ml   Filed Weights   06/07/23 0000 06/07/23 0600  Weight: 103.2 kg 103.2 kg    Examination: General:  Obese adult male sitting in bed in NAD HEENT: MM pink/moist Neuro: Aox3, MAE- LE c/chronic deformities  CV: rr, NSR with occ PVCs, no murmur PULM:  non labored, clear, 2L [place] GI: soft, bs+, NT/ ND, voids Extremities: warm/dry, np LE/ generalized edema  Skin: no rashes   Labs> K 4, Mag 1.9, BUN/ sCr  45/ 6.4, Hgb 8.5>7.6>  7.1, plts 134 UOP 450 ml/ 24hrs Net -   Resolved Hospital Problem list     Assessment & Plan:    AKI on CKD stage IV -unfortunately no reversible cause identified, likely has progressive renal disease vs ATN with hypervolemia Hyperkalemia NAGMA - previous sCr 4.85 (01/2023) up to 12.82 on admit P:  - CRRT per Nephrology for volume removal.  Still making some urine.   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  Hold coreg, hydralazine/ imdur for now  - TTE 3/16 EF 35-40%, normal RV, mod MR, indeterminate diastolic (stable from 9/23 EF 62-95%   NSVT - cont amio gtt for now - optimize electrolytes, K> 4, Mag > 2   OHS/ OSA -nocturnal CPAP, 2L - currently on baseline home requirements - cont supplemental O2 prn - CPAP at bedtime and prn - pulm hygiene    Anemia of chronic  disease - H/H stable, trend CBC.  No evidence of bleeding - s/p ESA 3/16, iron sat 42% - transfuse if < 7   Best Practice (right click and "Reselect all SmartList Selections" daily)   Diet/type: Regular consistency (see orders) DVT prophylaxis prophylactic heparin  Pressure ulcer(s): N/A GI prophylaxis: N/A Lines: Dialysis Catheter Foley:  N/A Code Status:  full code Last date of multidisciplinary goals of care discussion [NA]  Pt and mother updated at bedside 3/17  Labs   CBC: Recent Labs  Lab 06/06/23 0311 06/06/23 1330 06/07/23 0446  WBC 6.0 5.5 5.6  HGB 8.5* 7.6* 7.1*  HCT 29.8* 25.1* 23.3*  MCV 99.7 95.4 95.9  PLT 166 151 134*    Basic Metabolic Panel: Recent Labs  Lab 06/06/23 0453 06/06/23 0559 06/06/23 0720 06/06/23 1532 06/06/23 1901 06/07/23 0446  NA 142 141 142 144  --  137  138  K >7.5* >7.5* >7.5* 6.4* 4.8 4.0  4.0  CL 121* 122* 120* 117*  --  107  108  CO2 12* 13* 12* 16*  --  23  23  GLUCOSE 85 79 71 82  --  144*  141*  BUN 101* 105* 102* 88*  --  45*  44*  CREATININE 13.07* 13.08* 12.82* 11.74*  --  6.40*  6.39*  CALCIUM 7.8* 7.6* 7.6* 7.6*  --  7.4*  7.5*  MG  --   --   --   --  1.7 1.9  PHOS  --   --   --  6.5*  --  4.2  4.0   GFR: Estimated Creatinine Clearance: 13.2 mL/min (A) (by C-G formula based on SCr of 6.39 mg/dL (H)). Recent Labs  Lab 06/06/23 0311 06/06/23 1330 06/07/23 0446  WBC 6.0 5.5 5.6    Liver Function Tests: Recent Labs  Lab 06/06/23 1532 06/07/23 0446  ALBUMIN 1.8* 1.6*   No results for input(s): "LIPASE", "AMYLASE" in the last 168 hours. No results for input(s): "AMMONIA" in the last 168 hours.  ABG    Component Value Date/Time   PHART 7.309 (L) 02/21/2016 1025   PCO2ART 72.6 (HH) 02/21/2016 1025   PO2ART 134 (H) 02/21/2016 1025   HCO3 28.8 (H) 06/04/2021 0918   TCO2 30 06/04/2021 0918   O2SAT 68.7 06/07/2023 0446     Coagulation Profile: No results for input(s): "INR", "PROTIME" in the  last 168 hours.  Cardiac Enzymes: No results for input(s): "CKTOTAL", "CKMB", "CKMBINDEX", "TROPONINI" in the last 168 hours.  HbA1C: Hgb A1c MFr Bld  Date/Time Value Ref Range Status  06/06/2023 07:20 AM 4.2 (L) 4.8 - 5.6 % Final    Comment:    (NOTE) Pre diabetes:          5.7%-6.4%  Diabetes:              >6.4%  Glycemic control for   <7.0% adults with diabetes   06/02/2021 03:27 PM 4.8 4.8 - 5.6 % Final    Comment:    (NOTE)  Prediabetes: 5.7 - 6.4         Diabetes: >6.4         Glycemic control for adults with diabetes: <7.0     CBG: Recent Labs  Lab 06/06/23 1632 06/06/23 2150 06/07/23 0821 06/07/23 0845 06/07/23 0859  GLUCAP 119* 74 68* 66* 78   Allergies Allergies  Allergen Reactions   Fish Allergy Anaphylaxis   Other Anaphylaxis and Other (See Comments)    NO NUTS!!!!!! Peanut are legumes!!   "No red meat- Does not eat"   Peanuts [Peanut Oil] Anaphylaxis   Shellfish Allergy Anaphylaxis   Latex Itching   Pork-Derived Products Other (See Comments)    Does not eat- religious reasons     Home Medications  Prior to Admission medications   Medication Sig Start Date End Date Taking? Authorizing Provider  aspirin 81 MG tablet Take 1 tablet (81 mg total) by mouth daily. 04/21/13  Yes Vassie Loll, MD  carvedilol (COREG) 12.5 MG tablet TAKE 1 TABLET BY MOUTH TWICE DAILY WITH A MEAL 03/26/23  Yes Bensimhon, Bevelyn Buckles, MD  doxazosin (CARDURA) 2 MG tablet TAKE 1 TABLET BY MOUTH AT BEDTIME 05/27/23  Yes Bensimhon, Bevelyn Buckles, MD  empagliflozin (JARDIANCE) 10 MG TABS tablet Take 1 tablet by mouth once daily 04/14/23  Yes Bensimhon, Bevelyn Buckles, MD  furosemide (LASIX) 80 MG tablet Take 80 mg in and 40 mg in pm 01/21/23  Yes Bensimhon, Bevelyn Buckles, MD  hydrALAZINE (APRESOLINE) 100 MG tablet Take 1 tablet (100 mg total) by mouth 3 (three) times daily. 01/28/23  Yes Bensimhon, Bevelyn Buckles, MD  isosorbide mononitrate (IMDUR) 30 MG 24 hr tablet Take 1 tablet by mouth once daily  05/27/23  Yes Bensimhon, Bevelyn Buckles, MD  Olopatadine HCl (PATADAY OP) Place 1 drop into both eyes in the morning and at bedtime.   Yes [provider]  OXYGEN Inhale 2 L into the lungs as needed (shortness of breath).   Yes [provider]  potassium chloride SA (KLOR-CON M) 20 MEQ tablet Take 2 tablets by mouth once daily 12/08/22  Yes Andrey Farmer, PA-C  azithromycin (ZITHROMAX Z-PAK) 250 MG tablet 2 po day one, then 1 daily x 4 days Patient not taking: Reported on 06/06/2023 05/06/23   Fayrene Helper, PA-C  benzonatate (TESSALON) 100 MG capsule Take 1 capsule (100 mg total) by mouth every 8 (eight) hours. Patient not taking: Reported on 06/06/2023 05/06/23   Fayrene Helper, PA-C  erythromycin ophthalmic ointment Place 1 application into the right eye at bedtime. Patient not taking: Reported on 06/06/2023 05/01/21   [provider]        Posey Boyer, MSN, AG-ACNP-BC [facility] 06/07/2023, 11:48 AM  See Amion for pager If no response to pager , please call [phone] until 7pm After 7:00 pm call Elink  [phone]

## 2023-06-07 NOTE — Progress Notes (Addendum)
 Advanced Heart Failure Rounding Note  Cardiologist: None  Chief Complaint:  Subjective:    Tested + for the flu 05/06/23. Since then he started to feel bad.   CVP 6-8. Co-ox 69%. Started on CRRT with K>7.5 and SCr  13 on admission.   Today K WNL and SCr down trending. 6.4 today.   Feels much better since coming in, mom at bedside.  Objective:   Weight Range: 103.2 kg Body mass index is 45.95 kg/m.   Vital Signs:   Temp:  [98.2 F (36.8 C)-98.5 F (36.9 C)] 98.2 F (36.8 C) (03/17 0400) Pulse Rate:  [58-95] 63 (03/17 1000) Resp:  [15-27] 23 (03/17 1000) BP: (114-157)/(62-98) 138/82 (03/17 1000) SpO2:  [93 %-99 %] 99 % (03/17 1000) Weight:  [103.2 kg] 103.2 kg (03/17 0600) Last BM Date : 06/05/23  Weight change: Filed Weights   06/07/23 0000 06/07/23 0600  Weight: 103.2 kg 103.2 kg    Intake/Output:   Intake/Output Summary (Last 24 hours) at 06/07/2023 1030 Last data filed at 06/07/2023 1000 Gross per 24 hour  Intake 973.83 ml  Output 1809 ml  Net -835.17 ml  CVP 6-8 Physical Exam  General:  well appearing.  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 LIJ HD cath Neck: supple. JVD ~8 cm.  Cor: PMI nondisplaced. Regular rate & rhythm. No rubs, gallops or murmurs. Lungs: clear Abdomen: distended/ obese Extremities: no cyanosis, clubbing, rash, non-pitting BLE edema  Neuro: alert & oriented x 3. Moves all 4 extremities w/o difficulty. Affect pleasant.   Telemetry   NSR 70s 1-10 PVCs/min (Personally reviewed)    EKG    No new EKG to review  Labs    CBC Recent Labs    06/06/23 1330 06/07/23 0446  WBC 5.5 5.6  HGB 7.6* 7.1*  HCT 25.1* 23.3*  MCV 95.4 95.9  PLT 151 134*   Basic Metabolic Panel Recent Labs    16/10/96 1532 06/06/23 1901 06/07/23 0446  NA 144  --  137  138  K 6.4* 4.8 4.0  4.0  CL 117*  --  107  108  CO2 16*  --  23  23  GLUCOSE 82  --  144*  141*  BUN 88*  --  45*  44*   CREATININE 11.74*  --  6.40*  6.39*  CALCIUM 7.6*  --  7.4*  7.5*  MG  --  1.7 1.9  PHOS 6.5*  --  4.2  4.0   Liver Function Tests Recent Labs    06/06/23 1532 06/07/23 0446  ALBUMIN 1.8* 1.6*   No results for input(s): "LIPASE", "AMYLASE" in the last 72 hours. Cardiac Enzymes No results for input(s): "CKTOTAL", "CKMB", "CKMBINDEX", "TROPONINI" in the last 72 hours.  BNP: BNP (last 3 results) Recent Labs    01/19/23 1111 06/06/23 0311  BNP 769.3* 638.4*    ProBNP (last 3 results) No results for input(s): "PROBNP" in the last 8760 hours.   D-Dimer No results for input(s): "DDIMER" in the last 72 hours. Hemoglobin A1C Recent Labs    06/06/23 0720  HGBA1C 4.2*   Fasting Lipid Panel No results for input(s): "CHOL", "HDL", "LDLCALC", "TRIG", "CHOLHDL", "LDLDIRECT" in the last 72 hours. Thyroid Function Tests No results for input(s): "TSH", "T4TOTAL", "T3FREE", "THYROIDAB" in the last 72 hours.  Invalid input(s): "FREET3"  Other results:   Imaging    ECHOCARDIOGRAM COMPLETE Result Date: 06/06/2023    ECHOCARDIOGRAM REPORT   Patient Name:  Jerry Edwards Date of Exam: 06/06/2023 Medical Rec #:  [id number]     Height:       59.0 in Accession #:    [id number]    Weight:       224.0 lb Date of Birth:  01/14/1971     BSA:          1.935 m Patient Age:    53 years      BP:           124/76 mmHg Patient Gender: M             HR:           74 bpm. Exam Location:  Inpatient Procedure: 2D Echo, Color Doppler, Cardiac Doppler and Intracardiac            Opacification Agent (Both Spectral and Color Flow Doppler were            utilized during procedure). Indications:    I50.21 Acute systolic (congestive) heart failure  History:        Patient has prior history of Echocardiogram examinations, most                 recent 12/16/2021. Pulmonary HTN; Risk Factors:Hypertension,                 Diabetes and Sleep Apnea.  Sonographer:    Irving Burton Senior RDCS Referring Phys: [phone] Eliot Ford  [facility]  Sonographer Comments: No parasternal window due to body habitus and positioning IMPRESSIONS  1. The entire left ventricular apex is akinetic, there is no apparent left ventricular apical thrombus. Left ventricular ejection fraction, by estimation, is 35 to 40%. The left ventricle has moderately decreased function. The left ventricle demonstrates regional wall motion abnormalities (see scoring diagram/findings for description). Left ventricular diastolic function could not be evaluated.  2. Right ventricular systolic function is normal. The right ventricular size is normal.  3. The mitral valve is normal in structure. Moderate mitral valve regurgitation. No evidence of mitral stenosis.  4. The aortic valve is normal in structure. Aortic valve regurgitation is not visualized. No aortic stenosis is present.  5. The inferior vena cava is normal in size with greater than 50% respiratory variability, suggesting right atrial pressure of 3 mmHg. FINDINGS  Left Ventricle: The entire left ventricular apex is akinetic, there is no apparent left ventricular apical thrombus. Left ventricular ejection fraction, by estimation, is 35 to 40%. The left ventricle has moderately decreased function. The left ventricle demonstrates regional wall motion abnormalities. Definity contrast agent was given IV to delineate the left ventricular endocardial borders. The left ventricular internal cavity size was normal in size. There is no left ventricular hypertrophy.  Left ventricular diastolic function could not be evaluated due to mitral regurgitation (moderate or greater). Left ventricular diastolic function could not be evaluated.  LV Wall Scoring: The entire apex is akinetic. The anterior wall, antero-lateral wall, anterior septum, inferior wall, posterior wall, mid inferoseptal segment, and basal inferoseptal segment are hypokinetic. Right Ventricle: The right ventricular size is normal. No increase in right ventricular wall  thickness. Right ventricular systolic function is normal. Left Atrium: Left atrial size was normal in size. Right Atrium: Right atrial size was normal in size. Pericardium: There is no evidence of pericardial effusion. Mitral Valve: The mitral valve is normal in structure. Moderate mitral valve regurgitation. No evidence of mitral valve stenosis. Tricuspid Valve: The tricuspid valve is normal in structure. Tricuspid valve regurgitation is not demonstrated. No evidence  of tricuspid stenosis. Aortic Valve: The aortic valve is normal in structure. Aortic valve regurgitation is not visualized. No aortic stenosis is present. Pulmonic Valve: The pulmonic valve was normal in structure. Pulmonic valve regurgitation is not visualized. No evidence of pulmonic stenosis. Aorta: The aortic root is normal in size and structure. Venous: The inferior vena cava is normal in size with greater than 50% respiratory variability, suggesting right atrial pressure of 3 mmHg. IAS/Shunts: No atrial level shunt detected by color flow Doppler.  LEFT VENTRICLE PLAX 2D LVOT diam:     2.20 cm LV SV:         55 LV SV Index:   28 LVOT Area:     3.80 cm  LV Volumes (MOD) LV vol d, MOD A2C: 213.0 ml LV vol d, MOD A4C: 295.0 ml LV vol s, MOD A2C: 160.0 ml LV vol s, MOD A4C: 162.0 ml LV SV MOD A2C:     53.0 ml LV SV MOD A4C:     295.0 ml LV SV MOD BP:      91.3 ml RIGHT VENTRICLE RV S prime:     16.50 cm/s TAPSE (M-mode): 2.4 cm LEFT ATRIUM              Index        RIGHT ATRIUM           Index LA Vol (A2C):   103.0 ml 53.22 ml/m  RA Area:     13.90 cm LA Vol (A4C):   115.0 ml 59.42 ml/m  RA Volume:   27.20 ml  14.05 ml/m LA Biplane Vol: 110.0 ml 56.84 ml/m  AORTIC VALVE LVOT Vmax:   72.70 cm/s LVOT Vmean:  53.600 cm/s LVOT VTI:    0.145 m MR Peak grad:    114.9 mmHg MR Mean grad:    79.0 mmHg    SHUNTS MR Vmax:         536.00 cm/s  Systemic VTI:  0.14 m MR Vmean:        430.0 cm/s   Systemic Diam: 2.20 cm MR PISA:         2.26 cm MR PISA Eff  ROA: 14 mm MR PISA Radius:  0.60 cm Kardie Tobb DO Electronically signed by Thomasene Ripple DO Signature Date/Time: 06/06/2023/5:09:28 PM    Final    DG Chest Port 1 View Result Date: 06/06/2023 CLINICAL DATA:  [id number] Encounter for central line placement [id number] EXAM: PORTABLE CHEST 1 VIEW COMPARISON:  Same-day x-ray FINDINGS: Interval placement of left internal jugular approach central venous catheter with distal tip terminating near the superior cavoatrial junction. Stable cardiomegaly. Pulmonary vascular congestion. Similar bibasilar interstitial prominence. No pneumothorax. IMPRESSION: Interval placement of left internal jugular approach central venous catheter with distal tip terminating near the superior cavoatrial junction. No pneumothorax. Electronically Signed   By: Duanne Guess D.O.   On: 06/06/2023 12:33   Medications:   Scheduled Medications:  Chlorhexidine Gluconate Cloth  6 each Topical Daily   darbepoetin (ARANESP) injection - DIALYSIS  150 mcg Subcutaneous Q Sun-1800   heparin  5,000 Units Subcutaneous Q8H   insulin aspart  0-6 Units Subcutaneous TID WC   sodium chloride flush  10-40 mL Intracatheter Q12H    Infusions:  amiodarone 30 mg/hr (06/07/23 1000)   prismasol BGK 4/2.5 400 mL/hr at 06/07/23 0942   prismasol BGK 4/2.5 400 mL/hr at 06/07/23 0942   prismasol BGK 4/2.5 1,500 mL/hr at 06/07/23 0941    PRN Medications: docusate sodium,  heparin, polyethylene glycol, sodium chloride, sodium chloride flush Patient Profile   Jerry Edwards is a 53 y.o. with history of NICM, chronic systolic heart failure, HTN, OSA, CKD stage IV, and osteogenesis imperfecta. AHF team to see with AKI / CHF/  Assessment/Plan  1. AKI on CKD stage 4: Last creatinine 4.85 11/24, up to 12.82 on admission with marked hyperkalemia, metabolic acidosis, and suspect volume overload as well. BP stable.  - Now on CRRT. Management per nephrology.   - Suspect that he will need long-term RRT.  - Failed to  follow up with OP nephrology referrals.   2. Acute on chronic Systolic Heart Failure: Nonischemic cardiomyopathy.  Echo 1/15 EF 25%. cMRI 12/15 EF 38% possible infiltrative disease with discrete areas of non-coronary pattern delayed enhancement. Coronaries were normal on LHC. Echo 9/23 with EF 30-35%, mild LVH, RV normal.   - Echo this admission EF 35-40%, LV with RWMA, RV normal. Mod MR.  - NYHA class III symptoms on admission - Exam is difficult for volume. CVP 6-8.  - Per nephrology: UF 24mL/hr - co-ox 69%.  - Now on CRRT as above, nephrology following - Holding meds while on CRRT  3.  Mitral Regurgitation: TEE 3/23 showed Mild P2 restriction with 2-3+ (mod-severe central and posterior MR). Findings were d/w structural heart team. Valve is amenable to clipping but not severe enough at this point.  Echo 9/23 showed only mild MR.  He does not have a prominent murmur. Echo 3/25 with Mod MR.   4. OHS/OSA On home O2,  2L [place].  Has known OSA on CPAP.   5.  Osteogenesis imperfecta: Wheelchair-bound.   6. OSA: Continue CPAP.   7. 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   CRITICAL CARE Performed by: Alen Bleacher  Total critical care time: 12 minutes  Critical care time was exclusive of separately billable procedures and treating other patients.  Critical care was necessary to treat or prevent imminent or life-threatening deterioration.  Critical care was time spent personally by me on the following activities: development of treatment plan with patient and/or surrogate as well as nursing, discussions with consultants, evaluation of patient's response to treatment, examination of patient, obtaining history from patient or surrogate, ordering and performing treatments and interventions, ordering and review of laboratory studies, ordering and review of radiographic studies, pulse oximetry and re-evaluation of patient's condition.    Length of Stay: 1  Alen Bleacher, NP  06/07/2023, 10:30 AM  Advanced Heart Failure Team Pager [phone] (M-F; 7a - 5p)  Please contact [facility] for night-coverage after hours (5p -7a ) and weekends on [email or web address]  Agree with above.   Remains on CVVHD. Volume status improving. K has normalized. Rhythm stable  Co-ox 69% CVP 7-8 Minimal urine output  General:  Obese male lying in bed  No resp difficulty HEENT: normal  inverted lid on R Neck: supple.JVP hard to see Carotids 2+ bilat; no bruits. No lymphadenopathy or thryomegaly appreciated. + TLC Cor: Barrel chested. Regular 2/6 MR Lungs: clear Abdomen: soft, nontender, nondistended. No hepatosplenomegaly. No bruits or masses. Good bowel sounds. Extremities: no cyanosis, clubbing, rash, 1+ edema  + osteogenesis imperfecta Neuro: alert & orientedx3, cranial nerves grossly intact. moves all 4 extremities w/o difficulty. Affect pleasant   Remains anuric. Continue CVVHD. Volume status nearing euvolemia,   He will certainly be HD dependent. Would continue CVVHD until tomorrow and then cosndier stopping. Place [facility] with planned  switch to iHD>   CRITICAL CARE Performed by: Arvilla Meres  Total critical care time: 40 minutes  Critical care time was exclusive of separately billable procedures and treating other patients.  Critical care was necessary to treat or prevent imminent or life-threatening deterioration.  Critical care was time spent personally by me (independent of midlevel providers or residents) on the following activities: development of treatment plan with patient and/or surrogate as well as nursing, discussions with consultants, evaluation of patient's response to treatment, examination of patient, obtaining history from patient or surrogate, ordering and performing treatments and interventions, ordering and review of laboratory studies, ordering and review of radiographic studies, pulse oximetry and re-evaluation of  patient's condition.  Arvilla Meres, MD  6:15 PM

## 2023-06-07 NOTE — Progress Notes (Signed)
 Jerry Edwards is an 53 y.o. male with osteogenesis imperfecta- wheelchair bound, OSA, previous systolic dysfunction ( in 2023 EF 30-35%) as well as progressive ckd since 2021- most recently crt over 4. He sees Dr. Gala Romney but last seen in October of 2024- never seen a kidney MD. Now p/w SOB- had had his lasix dose decreased several months ago probably in response to elevated crt. He was noted to be on Jardiance as an OP as well as lasix and potassium. Labs returned a crt of 13 but also potassium of over 7.5. Pt felt poorly for weeks w/ later dec UOP   Assessment/Plan: 1.Renal-  progressive CKD as OP -  most recent GFR as OP in November was 14. Now is 4 with volume overload, hyperkalemia, and acidosis -> CRRT (started 3/16) to mostly bring K down but will correct acidosis and volume as well.  Started with 2 K pre and dialysate-  and bicarb post filter - no heparin -  with neg 50 per hour  VT last night, CRRT off for 30 minutes but then quickly back on, tolerating.  Will change potassium bath to 4K, continue UF 50 mL/h for now.  CVP is 10.  Patient's breathing is much more comfortable.  2. Hypertension/volume  - overloaded-tolerating 50 per hour of UF with CRRT 3. Hyperkalemia now normalized on CRRT-  was on repletion as OP-  given one round of acute meds and on lasix/lokelma-  bicarb post filter so zero K and 2 k for other fluids and follow for adjustment need -> change to 4K  4. Anemia  - not helping-  iron sat 42% and ESA (150 3/16), transfuse as needed  Subjective: He feels that his breathing is much improved; denies any nausea vomiting, chest pain.  Patient had VT last night and CRRT had to be turned off for 30 minutes.   Chemistry and CBC: Creatinine, Ser  Date/Time Value Ref Range Status  06/07/2023 04:46 AM 6.39 (H) 0.61 - 1.24 mg/dL Final  40/12/2723 36:64 AM 6.40 (H) 0.61 - 1.24 mg/dL Final  40/34/7425 95:63 PM 11.74 (H) 0.61 - 1.24 mg/dL Final  87/56/4332 95:18 AM 12.82 (H) 0.61 -  1.24 mg/dL Final  84/16/6063 01:60 AM 13.08 (H) 0.61 - 1.24 mg/dL Final  10/93/2355 73:22 AM 13.07 (H) 0.61 - 1.24 mg/dL Final  02/54/2706 23:76 AM 4.85 (H) 0.61 - 1.24 mg/dL Final  28/31/5176 16:07 AM 4.29 (H) 0.61 - 1.24 mg/dL Final  37/12/6267 48:54 PM 3.04 (H) 0.61 - 1.24 mg/dL Final  62/70/3500 93:81 AM 3.20 (H) 0.61 - 1.24 mg/dL Final  82/99/3716 96:78 AM 3.19 (H) 0.61 - 1.24 mg/dL Final  93/81/0175 10:25 AM 3.25 (H) 0.61 - 1.24 mg/dL Final  85/27/7824 23:53 PM 3.25 (H) 0.61 - 1.24 mg/dL Final  61/44/3154 00:86 AM 2.96 (H) 0.61 - 1.24 mg/dL Final  76/19/5093 26:71 AM 3.14 (H) 0.61 - 1.24 mg/dL Final  24/58/0998 33:82 AM 3.16 (H) 0.61 - 1.24 mg/dL Final  50/53/9767 34:19 AM 3.33 (H) 0.61 - 1.24 mg/dL Final  37/90/2409 73:53 PM 3.15 (H) 0.61 - 1.24 mg/dL Final  29/92/4268 34:19 AM 2.90 (H) 0.61 - 1.24 mg/dL Final  62/22/9798 92:11 PM 2.96 (H) 0.61 - 1.24 mg/dL Final  94/17/4081 44:81 AM 2.52 (H) 0.61 - 1.24 mg/dL Final  85/63/1497 02:63 AM 2.44 (H) 0.61 - 1.24 mg/dL Final  78/58/8502 77:41 AM 2.04 (H) 0.61 - 1.24 mg/dL Final  28/78/6767 20:94 PM 2.08 (H) 0.61 - 1.24 mg/dL Final  70/96/2836 62:94  PM 2.03 (H) 0.61 - 1.24 mg/dL Final  62/13/0865 78:46 AM 2.92 (H) 0.61 - 1.24 mg/dL Final  96/29/5284 13:24 AM 2.93 (H) 0.61 - 1.24 mg/dL Final  40/12/2723 36:64 AM 2.41 (H) 0.61 - 1.24 mg/dL Final  40/34/7425 95:63 PM 1.54 (H) 0.61 - 1.24 mg/dL Final  87/56/4332 95:18 AM 0.99 0.61 - 1.24 mg/dL Final  84/16/6063 01:60 AM 1.08 0.61 - 1.24 mg/dL Final  10/93/2355 73:22 AM 1.06 0.61 - 1.24 mg/dL Final  02/54/2706 23:76 AM 1.05 0.61 - 1.24 mg/dL Final  28/31/5176 16:07 PM 1.14 0.61 - 1.24 mg/dL Final  37/12/6267 48:54 PM 0.92 0.61 - 1.24 mg/dL Final  62/70/3500 93:81 AM 1.08 0.61 - 1.24 mg/dL Final  82/99/3716 96:78 PM 0.95 0.61 - 1.24 mg/dL Final  93/81/0175 10:25 AM 1.10 0.61 - 1.24 mg/dL Final  85/27/7824 23:53 AM 1.12 0.61 - 1.24 mg/dL Final  61/44/3154 00:86 AM 1.13 0.61 - 1.24 mg/dL  Final  76/19/5093 26:71 PM 1.00 0.61 - 1.24 mg/dL Final  24/58/0998 33:82 PM 0.99 0.61 - 1.24 mg/dL Final  50/53/9767 34:19 PM 0.87 0.61 - 1.24 mg/dL Final  37/90/2409 73:53 PM 0.87 0.61 - 1.24 mg/dL Final  29/92/4268 34:19 AM 0.86 0.61 - 1.24 mg/dL Final  62/22/9798 92:11 AM 1.00 0.61 - 1.24 mg/dL Final  94/17/4081 44:81 AM 1.21 0.61 - 1.24 mg/dL Final  85/63/1497 02:63 AM 1.50 (H) 0.61 - 1.24 mg/dL Final  78/58/8502 77:41 AM 2.17 (H) 0.61 - 1.24 mg/dL Final  28/78/6767 20:94 AM 1.79 (H) 0.61 - 1.24 mg/dL Final  70/96/2836 62:94 AM 1.54 (H) 0.61 - 1.24 mg/dL Final  76/54/6503 54:65 AM 1.09 0.61 - 1.24 mg/dL Final   Recent Labs  Lab 06/06/23 0453 06/06/23 0559 06/06/23 0720 06/06/23 1532 06/06/23 1901 06/07/23 0446  NA 142 141 142 144  --  137  138  K >7.5* >7.5* >7.5* 6.4* 4.8 4.0  4.0  CL 121* 122* 120* 117*  --  107  108  CO2 12* 13* 12* 16*  --  23  23  GLUCOSE 85 79 71 82  --  144*  141*  BUN 101* 105* 102* 88*  --  45*  44*  CREATININE 13.07* 13.08* 12.82* 11.74*  --  6.40*  6.39*  CALCIUM 7.8* 7.6* 7.6* 7.6*  --  7.4*  7.5*  PHOS  --   --   --  6.5*  --  4.2  4.0   Recent Labs  Lab 06/06/23 0311 06/06/23 1330 06/07/23 0446  WBC 6.0 5.5 5.6  HGB 8.5* 7.6* 7.1*  HCT 29.8* 25.1* 23.3*  MCV 99.7 95.4 95.9  PLT 166 151 134*   Liver Function Tests: Recent Labs  Lab 06/06/23 1532 06/07/23 0446  ALBUMIN 1.8* 1.6*   No results for input(s): "LIPASE", "AMYLASE" in the last 168 hours. No results for input(s): "AMMONIA" in the last 168 hours. Cardiac Enzymes: No results for input(s): "CKTOTAL", "CKMB", "CKMBINDEX", "TROPONINI" in the last 168 hours. Iron Studies:  Recent Labs    06/06/23 1330  IRON 99  TIBC 238*  FERRITIN 534*   PT/INR: @LABRCNTIP (inr:5)  Xrays/Other Studies: ) Results for orders placed or performed during the hospital encounter of 06/06/23 (from the past 48 hours)  CBC     Status: Abnormal   Collection Time: 06/06/23  3:11 AM   Result Value Ref Range   WBC 6.0 4.0 - 10.5 K/uL   RBC 2.99 (L) 4.22 - 5.81 MIL/uL   Hemoglobin 8.5 (L) 13.0 - 17.0  g/dL   HCT 78.2 (L) 95.6 - 21.3 %   MCV 99.7 80.0 - 100.0 fL   MCH 28.4 26.0 - 34.0 pg   MCHC 28.5 (L) 30.0 - 36.0 g/dL   RDW 08.6 57.8 - 46.9 %   Platelets 166 150 - 400 K/uL   nRBC 0.0 0.0 - 0.2 %    Comment: Performed at [facility], [address] [place], [place] [address]  Brain natriuretic peptide     Status: Abnormal   Collection Time: 06/06/23  3:11 AM  Result Value Ref Range   B Natriuretic Peptide 638.4 (H) 0.0 - 100.0 pg/mL    Comment: Performed at [facility], [address] [place], [place] [address]  Troponin I (High Sensitivity)     Status: Abnormal   Collection Time: 06/06/23  3:11 AM  Result Value Ref Range   Troponin I (High Sensitivity) 57 (H) <18 ng/L    Comment: (NOTE) Elevated high sensitivity troponin I (hsTnI) values and significant  changes across serial measurements may suggest ACS but many other  chronic and acute conditions are known to elevate hsTnI results.  Refer to the Links section for chest pain algorithms and additional  guidance. Performed at [facility], [address] [place], [place] [address]   Resp panel by RT-PCR (RSV, Flu A&B, Covid) Anterior Nasal Swab     Status: None   Collection Time: 06/06/23  4:20 AM   Specimen: Anterior Nasal Swab  Result Value Ref Range   SARS Coronavirus 2 by RT PCR NEGATIVE NEGATIVE    Comment: (NOTE) SARS-CoV-2 target nucleic acids are NOT DETECTED.  The SARS-CoV-2 RNA is generally detectable in upper respiratory specimens during the acute phase of infection. The lowest concentration of SARS-CoV-2 viral copies this assay can detect is 138 copies/mL. A negative result does not preclude SARS-Cov-2 infection and should not be used as the sole basis for treatment or other patient management decisions. A negative result may occur  with  improper specimen collection/handling, submission of specimen other than nasopharyngeal swab, presence of viral mutation(s) within the areas targeted by this assay, and inadequate number of viral copies(<138 copies/mL). A negative result must be combined with clinical observations, patient history, and epidemiological information. The expected result is Negative.  Fact Sheet for Patients:  BloggerCourse.com  Fact Sheet for Healthcare Providers:  SeriousBroker.it  This test is no t yet approved or cleared by the [place] FDA and  has been authorized for detection and/or diagnosis of SARS-CoV-2 by FDA under an Emergency Use Authorization (EUA). This EUA will remain  in effect (meaning this test can be used) for the duration of the COVID-19 declaration under Section 564(b)(1) of the Act, 21 U.S.C.section 360bbb-3(b)(1), unless the authorization is terminated  or revoked sooner.       Influenza A by PCR NEGATIVE NEGATIVE   Influenza B by PCR NEGATIVE NEGATIVE    Comment: (NOTE) The Xpert Xpress SARS-CoV-2/FLU/RSV plus assay is intended as an aid in the diagnosis of influenza from Nasopharyngeal swab specimens and should not be used as a sole basis for treatment. Nasal washings and aspirates are unacceptable for Xpert Xpress SARS-CoV-2/FLU/RSV testing.  Fact Sheet for Patients: BloggerCourse.com  Fact Sheet for Healthcare Providers: SeriousBroker.it  This test is not yet approved or cleared by the [place] FDA and has been authorized for detection and/or diagnosis of SARS-CoV-2 by FDA under an Emergency Use Authorization (EUA). This EUA will remain in effect (meaning  this test can be used) for the duration of the COVID-19 declaration under Section 564(b)(1) of the Act, 21 U.S.C. section 360bbb-3(b)(1), unless the authorization is terminated or revoked.     Resp  Syncytial Virus by PCR NEGATIVE NEGATIVE    Comment: (NOTE) Fact Sheet for Patients: BloggerCourse.com  Fact Sheet for Healthcare Providers: SeriousBroker.it  This test is not yet approved or cleared by the [place] FDA and has been authorized for detection and/or diagnosis of SARS-CoV-2 by FDA under an Emergency Use Authorization (EUA). This EUA will remain in effect (meaning this test can be used) for the duration of the COVID-19 declaration under Section 564(b)(1) of the Act, 21 U.S.C. section 360bbb-3(b)(1), unless the authorization is terminated or revoked.  Performed at [facility], [address] [place], [place] [address]   POC occult blood, ED RN will collect     Status: None   Collection Time: 06/06/23  4:49 AM  Result Value Ref Range   Fecal Occult Bld NEGATIVE NEGATIVE  Basic metabolic panel     Status: Abnormal   Collection Time: 06/06/23  4:53 AM  Result Value Ref Range   Sodium 142 135 - 145 mmol/L   Potassium >7.5 (HH) 3.5 - 5.1 mmol/L    Comment: CRITICAL RESULT CALLED TO, READ BACK BY AND VERIFIED WITH Cathey Endow, M RN AT [phone] ON 06/06/2023 BY Milly Jakob    Chloride 121 (H) 98 - 111 mmol/L   CO2 12 (L) 22 - 32 mmol/L   Glucose, Bld 85 70 - 99 mg/dL    Comment: Glucose reference range applies only to samples taken after fasting for at least 8 hours.   BUN 101 (H) 6 - 20 mg/dL    Comment: RESULTS CONFIRMED BY MANUAL DILUTION   Creatinine, Ser 13.07 (H) 0.61 - 1.24 mg/dL   Calcium 7.8 (L) 8.9 - 10.3 mg/dL   GFR, Estimated 4 (L) >60 mL/min    Comment: (NOTE) Calculated using the CKD-EPI Creatinine Equation (2021)    Anion gap 9 5 - 15    Comment: Performed at [facility], [address], [place], [place] [address]  Troponin I (High Sensitivity)     Status: Abnormal   Collection Time: 06/06/23  4:53 AM  Result Value Ref Range   Troponin I (High Sensitivity) 57 (H) <18  ng/L    Comment: (NOTE) Elevated high sensitivity troponin I (hsTnI) values and significant  changes across serial measurements may suggest ACS but many other  chronic and acute conditions are known to elevate hsTnI results.  Refer to the Links section for chest pain algorithms and additional  guidance. Performed at [facility], [address], [place], [place] [address]   Basic metabolic panel     Status: Abnormal   Collection Time: 06/06/23  5:59 AM  Result Value Ref Range   Sodium 141 135 - 145 mmol/L   Potassium >7.5 (HH) 3.5 - 5.1 mmol/L    Comment: CRITICAL RESULT CALLED TO, READ BACK BY AND VERIFIED WITH Cathey Endow, M RN AT [phone] ON 06/06/2023 BY Milly Jakob    Chloride 122 (H) 98 - 111 mmol/L   CO2 13 (L) 22 - 32 mmol/L   Glucose, Bld 79 70 - 99 mg/dL    Comment: Glucose reference range applies only to samples taken after fasting for at least 8 hours.   BUN 105 (H) 6 - 20 mg/dL    Comment: RESULT CONFIRMED BY AUTOMATED DILUTION   Creatinine,  Ser 13.08 (H) 0.61 - 1.24 mg/dL   Calcium 7.6 (L) 8.9 - 10.3 mg/dL   GFR, Estimated 4 (L) >60 mL/min    Comment: (NOTE) Calculated using the CKD-EPI Creatinine Equation (2021)    Anion gap 6 5 - 15    Comment: Performed at [facility], [address] [place], [place] [address]  Iron and TIBC     Status: None   Collection Time: 06/06/23  7:20 AM  Result Value Ref Range   Iron 96 45 - 182 ug/dL   TIBC 962 952 - 841 ug/dL   Saturation Ratios 38 17.9 - 39.5 %   UIBC 157 ug/dL    Comment: Performed at [facility], [address], [place], [place] [address]  Ferritin (Iron Binding Protein)     Status: Abnormal   Collection Time: 06/06/23  7:20 AM  Result Value Ref Range   Ferritin 526 (H) 24 - 336 ng/mL    Comment: Performed at [facility], [address] [place], [place] [address]  Hemoglobin A1c     Status: Abnormal   Collection Time: 06/06/23  7:20 AM   Result Value Ref Range   Hgb A1c MFr Bld 4.2 (L) 4.8 - 5.6 %    Comment: (NOTE) Pre diabetes:          5.7%-6.4%  Diabetes:              >6.4%  Glycemic control for   <7.0% adults with diabetes    Mean Plasma Glucose 73.84 mg/dL    Comment: Performed at [facility], [address] [place], [place] [address]  Basic metabolic panel     Status: Abnormal   Collection Time: 06/06/23  7:20 AM  Result Value Ref Range   Sodium 142 135 - 145 mmol/L   Potassium >7.5 (HH) 3.5 - 5.1 mmol/L    Comment: CRITICAL RESULT CALLED TO, READ BACK BY AND VERIFIED WITH [place], T RN AT [phone] ON 06/06/2023 BY Deedra Ehrich, K    Chloride 120 (H) 98 - 111 mmol/L   CO2 12 (L) 22 - 32 mmol/L   Glucose, Bld 71 70 - 99 mg/dL    Comment: Glucose reference range applies only to samples taken after fasting for at least 8 hours.   BUN 102 (H) 6 - 20 mg/dL    Comment: RESULT CONFIRMED BY MANUAL DILUTION   Creatinine, Ser 12.82 (H) 0.61 - 1.24 mg/dL   Calcium 7.6 (L) 8.9 - 10.3 mg/dL   GFR, Estimated 4 (L) >60 mL/min    Comment: (NOTE) Calculated using the CKD-EPI Creatinine Equation (2021)    Anion gap 10 5 - 15    Comment: Performed at [facility], [address] [place], [place] [address]  MRSA Next Gen by PCR, Nasal     Status: None   Collection Time: 06/06/23 10:58 AM   Specimen: Nasal Mucosa; Nasal Swab  Result Value Ref Range   MRSA by PCR Next Gen NOT DETECTED NOT DETECTED    Comment: (NOTE) The GeneXpert MRSA Assay (FDA approved for NASAL specimens only), is one component of a comprehensive MRSA colonization surveillance program. It is not intended to diagnose MRSA infection nor to guide or monitor treatment for MRSA infections. Test performance is not FDA approved in patients less than 36 years old. Performed at [facility], [address] [place], [place] [address]   Cooxemetry Panel (carboxy, met, total hgb, O2 sat)  Status: Abnormal   Collection Time:  06/06/23 12:27 PM  Result Value Ref Range   Total hemoglobin <6.9 (LL) 12.0 - 16.0 g/dL    Comment: CRITICAL RESULT CALLED TO, READ BACK BY AND VERIFIED WITH: JOSH HOOD RN BY HUGHESCH AT 1256PM [phone]    O2 Saturation 85.7 %   Carboxyhemoglobin 1.9 (H) 0.5 - 1.5 %   Methemoglobin 1.9 (H) 0.0 - 1.5 %    Comment: Performed at [facility], [address] [place], [place] [address]  Glucose, capillary     Status: Abnormal   Collection Time: 06/06/23 12:39 PM  Result Value Ref Range   Glucose-Capillary 68 (L) 70 - 99 mg/dL    Comment: Glucose reference range applies only to samples taken after fasting for at least 8 hours.  Glucose, capillary     Status: None   Collection Time: 06/06/23  1:12 PM  Result Value Ref Range   Glucose-Capillary 92 70 - 99 mg/dL    Comment: Glucose reference range applies only to samples taken after fasting for at least 8 hours.  HIV Antibody (routine testing w rflx)     Status: None   Collection Time: 06/06/23  1:30 PM  Result Value Ref Range   HIV Screen 4th Generation wRfx Non Reactive Non Reactive    Comment: Performed at [facility], [address] [place], [place] [address]  CBC     Status: Abnormal   Collection Time: 06/06/23  1:30 PM  Result Value Ref Range   WBC 5.5 4.0 - 10.5 K/uL   RBC 2.63 (L) 4.22 - 5.81 MIL/uL   Hemoglobin 7.6 (L) 13.0 - 17.0 g/dL   HCT 52.8 (L) 41.3 - 24.4 %   MCV 95.4 80.0 - 100.0 fL   MCH 28.9 26.0 - 34.0 pg   MCHC 30.3 30.0 - 36.0 g/dL   RDW 01.0 27.2 - 53.6 %   Platelets 151 150 - 400 K/uL   nRBC 0.0 0.0 - 0.2 %    Comment: Performed at [facility], [address], [place], [place] [address]  Iron and TIBC     Status: Abnormal   Collection Time: 06/06/23  1:30 PM  Result Value Ref Range   Iron 99 45 - 182 ug/dL   TIBC 474 (L) 259 - 563 ug/dL   Saturation Ratios 42 (H) 17.9 - 39.5 %   UIBC 139 ug/dL    Comment: Performed at [facility], [address], [place], [place] [address]   Ferritin     Status: Abnormal   Collection Time: 06/06/23  1:30 PM  Result Value Ref Range   Ferritin 534 (H) 24 - 336 ng/mL    Comment: Performed at [facility], [address] [place], [place] [address]  Renal function panel (daily at 1600)     Status: Abnormal   Collection Time: 06/06/23  3:32 PM  Result Value Ref Range   Sodium 144 135 - 145 mmol/L   Potassium 6.4 (HH) 3.5 - 5.1 mmol/L    Comment: CRITICAL RESULT CALLED TO, READ BACK BY AND VERIFIED WITH HOOD.J RN @1610  06/06/23 S OUROBANGNA   Chloride 117 (H) 98 - 111 mmol/L   CO2 16 (L) 22 - 32 mmol/L   Glucose, Bld 82 70 - 99 mg/dL    Comment: Glucose reference range applies only to samples taken after fasting for at least 8 hours.   BUN 88 (H) 6 - 20 mg/dL  Creatinine, Ser 11.74 (H) 0.61 - 1.24 mg/dL   Calcium 7.6 (L) 8.9 - 10.3 mg/dL   Phosphorus 6.5 (H) 2.5 - 4.6 mg/dL   Albumin 1.8 (L) 3.5 - 5.0 g/dL   GFR, Estimated 5 (L) >60 mL/min    Comment: (NOTE) Calculated using the CKD-EPI Creatinine Equation (2021)    Anion gap 11 5 - 15    Comment: Performed at [facility], [address] [place], [place] [address]  Glucose, capillary     Status: Abnormal   Collection Time: 06/06/23  3:52 PM  Result Value Ref Range   Glucose-Capillary 68 (L) 70 - 99 mg/dL    Comment: Glucose reference range applies only to samples taken after fasting for at least 8 hours.  Glucose, capillary     Status: Abnormal   Collection Time: 06/06/23  4:32 PM  Result Value Ref Range   Glucose-Capillary 119 (H) 70 - 99 mg/dL    Comment: Glucose reference range applies only to samples taken after fasting for at least 8 hours.  Potassium     Status: None   Collection Time: 06/06/23  7:01 PM  Result Value Ref Range   Potassium 4.8 3.5 - 5.1 mmol/L    Comment: Performed at [facility], [address], [place], [place] [address]  Magnesium     Status: None   Collection Time: 06/06/23  7:01 PM  Result Value Ref Range   Magnesium  1.7 1.7 - 2.4 mg/dL    Comment: Performed at [facility], [address], [place], [place] [address]  Glucose, capillary     Status: None   Collection Time: 06/06/23  9:50 PM  Result Value Ref Range   Glucose-Capillary 74 70 - 99 mg/dL    Comment: Glucose reference range applies only to samples taken after fasting for at least 8 hours.  CBC     Status: Abnormal   Collection Time: 06/07/23  4:46 AM  Result Value Ref Range   WBC 5.6 4.0 - 10.5 K/uL   RBC 2.43 (L) 4.22 - 5.81 MIL/uL   Hemoglobin 7.1 (L) 13.0 - 17.0 g/dL    Comment: REPEATED TO VERIFY   HCT 23.3 (L) 39.0 - 52.0 %   MCV 95.9 80.0 - 100.0 fL   MCH 29.2 26.0 - 34.0 pg   MCHC 30.5 30.0 - 36.0 g/dL   RDW 52.8 41.3 - 24.4 %   Platelets 134 (L) 150 - 400 K/uL    Comment: REPEATED TO VERIFY   nRBC 0.0 0.0 - 0.2 %    Comment: Performed at [facility], [address] [place], [place] [address]  Basic metabolic panel     Status: Abnormal   Collection Time: 06/07/23  4:46 AM  Result Value Ref Range   Sodium 138 135 - 145 mmol/L   Potassium 4.0 3.5 - 5.1 mmol/L   Chloride 108 98 - 111 mmol/L   CO2 23 22 - 32 mmol/L   Glucose, Bld 141 (H) 70 - 99 mg/dL    Comment: Glucose reference range applies only to samples taken after fasting for at least 8 hours.   BUN 44 (H) 6 - 20 mg/dL   Creatinine, Ser 2.53 (H) 0.61 - 1.24 mg/dL   Calcium 7.5 (L) 8.9 - 10.3 mg/dL   GFR, Estimated 10 (L) >60 mL/min    Comment: (NOTE) Calculated using the CKD-EPI Creatinine Equation (2021)    Anion gap 7 5 - 15  Comment: Performed at [facility], [address], [place], [place] [address]  Magnesium     Status: None   Collection Time: 06/07/23  4:46 AM  Result Value Ref Range   Magnesium 1.9 1.7 - 2.4 mg/dL    Comment: Performed at [facility], [address], [place], [place] [address]  Phosphorus     Status: None   Collection Time: 06/07/23  4:46 AM  Result Value Ref Range   Phosphorus 4.0 2.5 - 4.6 mg/dL     Comment: Performed at [facility], [address] [place], [place] [address]  Cooxemetry Panel (carboxy, met, total hgb, O2 sat)     Status: Abnormal   Collection Time: 06/07/23  4:46 AM  Result Value Ref Range   Total hemoglobin 7.5 (L) 12.0 - 16.0 g/dL   O2 Saturation 78.4 %   Carboxyhemoglobin 1.5 0.5 - 1.5 %   Methemoglobin <0.7 0.0 - 1.5 %    Comment: Performed at [facility], [address] [place], [place] [address]  Renal function panel (daily at 0500)     Status: Abnormal   Collection Time: 06/07/23  4:46 AM  Result Value Ref Range   Sodium 137 135 - 145 mmol/L   Potassium 4.0 3.5 - 5.1 mmol/L   Chloride 107 98 - 111 mmol/L   CO2 23 22 - 32 mmol/L   Glucose, Bld 144 (H) 70 - 99 mg/dL    Comment: Glucose reference range applies only to samples taken after fasting for at least 8 hours.   BUN 45 (H) 6 - 20 mg/dL   Creatinine, Ser 5.28 (H) 0.61 - 1.24 mg/dL   Calcium 7.4 (L) 8.9 - 10.3 mg/dL   Phosphorus 4.2 2.5 - 4.6 mg/dL   Albumin 1.6 (L) 3.5 - 5.0 g/dL   GFR, Estimated 10 (L) >60 mL/min    Comment: (NOTE) Calculated using the CKD-EPI Creatinine Equation (2021)    Anion gap 7 5 - 15    Comment: Performed at [facility], [address], [place], [place] [address]  Glucose, capillary     Status: Abnormal   Collection Time: 06/07/23  8:21 AM  Result Value Ref Range   Glucose-Capillary 68 (L) 70 - 99 mg/dL    Comment: Glucose reference range applies only to samples taken after fasting for at least 8 hours.   ECHOCARDIOGRAM COMPLETE Result Date: 06/06/2023    ECHOCARDIOGRAM REPORT   Patient Name:   MATTHE SLOANE Date of Exam: 06/06/2023 Medical Rec #:  [id number]     Height:       59.0 in Accession #:    [id number]    Weight:       224.0 lb Date of Birth:  1971/02/23     BSA:          1.935 m Patient Age:    53 years      BP:           124/76 mmHg Patient Gender: M             HR:           74 bpm. Exam Location:  Inpatient Procedure: 2D Echo, Color  Doppler, Cardiac Doppler and Intracardiac            Opacification Agent (Both Spectral and Color Flow Doppler were            utilized during procedure). Indications:    I50.21 Acute  systolic (congestive) heart failure  History:        Patient has prior history of Echocardiogram examinations, most                 recent 12/16/2021. Pulmonary HTN; Risk Factors:Hypertension,                 Diabetes and Sleep Apnea.  Sonographer:    Irving Burton Senior RDCS Referring Phys: [phone] Eliot Ford [facility]  Sonographer Comments: No parasternal window due to body habitus and positioning IMPRESSIONS  1. The entire left ventricular apex is akinetic, there is no apparent left ventricular apical thrombus. Left ventricular ejection fraction, by estimation, is 35 to 40%. The left ventricle has moderately decreased function. The left ventricle demonstrates regional wall motion abnormalities (see scoring diagram/findings for description). Left ventricular diastolic function could not be evaluated.  2. Right ventricular systolic function is normal. The right ventricular size is normal.  3. The mitral valve is normal in structure. Moderate mitral valve regurgitation. No evidence of mitral stenosis.  4. The aortic valve is normal in structure. Aortic valve regurgitation is not visualized. No aortic stenosis is present.  5. The inferior vena cava is normal in size with greater than 50% respiratory variability, suggesting right atrial pressure of 3 mmHg. FINDINGS  Left Ventricle: The entire left ventricular apex is akinetic, there is no apparent left ventricular apical thrombus. Left ventricular ejection fraction, by estimation, is 35 to 40%. The left ventricle has moderately decreased function. The left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

## 2023-06-08 ENCOUNTER — Inpatient Hospital Stay (HOSPITAL_COMMUNITY)

## 2023-06-08 DIAGNOSIS — I5023 Acute on chronic systolic (congestive) heart failure: Secondary | ICD-10-CM | POA: Diagnosis not present

## 2023-06-08 DIAGNOSIS — N179 Acute kidney failure, unspecified: Secondary | ICD-10-CM | POA: Diagnosis not present

## 2023-06-08 HISTORY — PX: IR FLUORO GUIDE CV LINE RIGHT: IMG2283

## 2023-06-08 HISTORY — PX: IR US GUIDE VASC ACCESS RIGHT: IMG2390

## 2023-06-08 LAB — RENAL FUNCTION PANEL
Albumin: 1.6 g/dL — ABNORMAL LOW (ref 3.5–5.0)
Albumin: 1.7 g/dL — ABNORMAL LOW (ref 3.5–5.0)
Anion gap: 7 (ref 5–15)
Anion gap: 7 (ref 5–15)
BUN: 22 mg/dL — ABNORMAL HIGH (ref 6–20)
BUN: 21 mg/dL — ABNORMAL HIGH (ref 6–20)
CO2: 25 mmol/L (ref 22–32)
CO2: 23 mmol/L (ref 22–32)
Calcium: 7.4 mg/dL — ABNORMAL LOW (ref 8.9–10.3)
Calcium: 7.2 mg/dL — ABNORMAL LOW (ref 8.9–10.3)
Chloride: 104 mmol/L (ref 98–111)
Chloride: 107 mmol/L (ref 98–111)
Creatinine, Ser: 3.71 mg/dL — ABNORMAL HIGH (ref 0.61–1.24)
Creatinine, Ser: 3.97 mg/dL — ABNORMAL HIGH (ref 0.61–1.24)
GFR, Estimated: 19 mL/min — ABNORMAL LOW (ref >60)
GFR, Estimated: 17 mL/min — ABNORMAL LOW (ref >60)
Glucose, Bld: 82 mg/dL (ref 70–99)
Glucose, Bld: 104 mg/dL — ABNORMAL HIGH (ref 70–99)
Phosphorus: 3.4 mg/dL (ref 2.5–4.6)
Phosphorus: 3.9 mg/dL (ref 2.5–4.6)
Potassium: 4.3 mmol/L (ref 3.5–5.1)
Potassium: 4 mmol/L (ref 3.5–5.1)
Sodium: 136 mmol/L (ref 135–145)
Sodium: 137 mmol/L (ref 135–145)

## 2023-06-08 LAB — CBC
HCT: 23.4 % — ABNORMAL LOW (ref 39.0–52.0)
Hemoglobin: 7 g/dL — ABNORMAL LOW (ref 13.0–17.0)
MCH: 28 pg (ref 26.0–34.0)
MCHC: 29.9 g/dL — ABNORMAL LOW (ref 30.0–36.0)
MCV: 93.6 fL (ref 80.0–100.0)
Platelets: 120 10*3/uL — ABNORMAL LOW (ref 150–400)
RBC: 2.5 MIL/uL — ABNORMAL LOW (ref 4.22–5.81)
RDW: 12.7 % (ref 11.5–15.5)
WBC: 6 10*3/uL (ref 4.0–10.5)
nRBC: 0.3 % — ABNORMAL HIGH (ref 0.0–0.2)

## 2023-06-08 LAB — GLUCOSE, CAPILLARY
Glucose-Capillary: 71 mg/dL (ref 70–99)
Glucose-Capillary: 76 mg/dL (ref 70–99)
Glucose-Capillary: 95 mg/dL (ref 70–99)
Glucose-Capillary: 107 mg/dL — ABNORMAL HIGH (ref 70–99)

## 2023-06-08 LAB — ABO/RH: ABO/RH(D): A POS

## 2023-06-08 LAB — PREPARE RBC (CROSSMATCH)

## 2023-06-08 LAB — COOXEMETRY PANEL
Carboxyhemoglobin: 1 % (ref 0.5–1.5)
Methemoglobin: <0.7 % (ref 0.0–1.5)
O2 Saturation: 76.5 %
Total hemoglobin: <6.9 g/dL — CL (ref 12.0–16.0)

## 2023-06-08 LAB — HEPATITIS B SURFACE ANTIGEN: Hepatitis B Surface Ag: NONREACTIVE

## 2023-06-08 LAB — MAGNESIUM: Magnesium: 2 mg/dL (ref 1.7–2.4)

## 2023-06-08 MED ORDER — CHLORHEXIDINE GLUCONATE CLOTH 2 % EX PADS
6.0000 | MEDICATED_PAD | Freq: Every day | CUTANEOUS | Status: DC
  Administered 2023-06-11 – 2023-06-17 (×7): 6 via TOPICAL

## 2023-06-08 MED ORDER — CEFAZOLIN SODIUM-DEXTROSE 2-4 GM/100ML-% IV SOLN
INTRAVENOUS | Status: AC
  Filled 2023-06-08: qty 100

## 2023-06-08 MED ORDER — MIDAZOLAM HCL 2 MG/2ML IJ SOLN
INTRAMUSCULAR | Status: AC
  Filled 2023-06-08: qty 2

## 2023-06-08 MED ORDER — ACETAMINOPHEN 325 MG PO TABS
650.0000 mg | ORAL_TABLET | Freq: Four times a day (QID) | ORAL | Status: DC | PRN
Start: 2023-06-08 — End: 2023-06-18
  Administered 2023-06-10: 650 mg via ORAL
  Filled 2023-06-08 (×3): qty 2

## 2023-06-08 MED ORDER — CEFAZOLIN SODIUM-DEXTROSE 2-4 GM/100ML-% IV SOLN: 2.0000 g | INTRAVENOUS | Status: AC

## 2023-06-08 MED ORDER — SODIUM CHLORIDE 0.9% IV SOLUTION: Freq: Once | INTRAVENOUS | Status: AC

## 2023-06-08 MED ORDER — LIDOCAINE-EPINEPHRINE 1 %-1:100000 IJ SOLN
INTRAMUSCULAR | Status: AC
  Filled 2023-06-08: qty 1

## 2023-06-08 MED ORDER — MIDAZOLAM HCL 2 MG/2ML IJ SOLN
INTRAMUSCULAR | Status: AC | PRN
  Administered 2023-06-08: 1 mg via INTRAVENOUS
  Administered 2023-06-08: .5 mg via INTRAVENOUS

## 2023-06-08 MED ORDER — TRAMADOL HCL 50 MG PO TABS
25.0000 mg | ORAL_TABLET | Freq: Once | ORAL | Status: AC | PRN
  Administered 2023-06-08: 25 mg via ORAL
  Filled 2023-06-08: qty 1

## 2023-06-08 MED ORDER — HEPARIN SODIUM (PORCINE) 1000 UNIT/ML IJ SOLN
INTRAMUSCULAR | Status: AC
  Filled 2023-06-08: qty 10

## 2023-06-08 MED ORDER — FENTANYL CITRATE (PF) 100 MCG/2ML IJ SOLN
INTRAMUSCULAR | Status: AC | PRN
  Administered 2023-06-08 (×2): 25 ug via INTRAVENOUS

## 2023-06-08 MED ORDER — LIDOCAINE-EPINEPHRINE 1 %-1:100000 IJ SOLN
20.0000 mL | Freq: Once | INTRAMUSCULAR | Status: AC
  Administered 2023-06-08: 20 mL via INTRADERMAL

## 2023-06-08 MED ORDER — CEFAZOLIN SODIUM-DEXTROSE 2-4 GM/100ML-% IV SOLN
INTRAVENOUS | Status: AC | PRN
  Administered 2023-06-08: 2 g via INTRAVENOUS

## 2023-06-08 MED ORDER — FENTANYL CITRATE (PF) 100 MCG/2ML IJ SOLN
INTRAMUSCULAR | Status: AC
Start: 2023-06-08 — End: ?
  Filled 2023-06-08: qty 2

## 2023-06-08 NOTE — Progress Notes (Addendum)
 Advanced Heart Failure Rounding Note  Cardiologist: None  Chief Complaint:  Subjective:    Started on CRRT 3/16 with K>7.5 and SCr 13 on admission.  CVP 8. Co-ox 77%.    Today K WNL and SCr down trending. 3.71 today.   Hgb 7 today.   Feels better today. Denies CP/SOB.  Objective:   Weight Range: 100.3 kg Body mass index is 44.66 kg/m.   Vital Signs:   Temp:  [98.2 F (36.8 C)-98.6 F (37 C)] 98.6 F (37 C) (03/18 0400) Pulse Rate:  [63-86] 70 (03/18 0800) Resp:  [17-28] 27 (03/18 0800) BP: (108-143)/(63-87) 110/63 (03/18 0800) SpO2:  [92 %-100 %] 98 % (03/18 0800) Weight:  [100.3 kg] 100.3 kg (03/18 0600) Last BM Date : 06/07/23  Weight change: Filed Weights   06/07/23 0000 06/07/23 0600 06/08/23 0600  Weight: 103.2 kg 103.2 kg 100.3 kg    Intake/Output:   Intake/Output Summary (Last 24 hours) at 06/08/2023 0921 Last data filed at 06/08/2023 0800 Gross per 24 hour  Intake 499.23 ml  Output 2116 ml  Net -1616.77 ml  CVP 8 Physical Exam  General:  well appearing.  No respiratory difficulty (short stature 2/2 osteogenesis imperfecta) HEENT: + glasses. LIJ HD cath Neck: supple. JVD ~8 cm.  Cor: PMI nondisplaced. Regular rate & rhythm. No rubs, gallops or murmurs. Lungs: clear Abdomen: disteneded/ obese. Extremities: no cyanosis, clubbing, rash, edema  Neuro: alert & oriented x 3. Moves all 4 extremities w/o difficulty. Affect pleasant.  Telemetry   NSR 70s 0-2 PVCs/min (Personally reviewed)    EKG    No new EKG to review  Labs    CBC Recent Labs    06/07/23 0446 06/08/23 0431  WBC 5.6 6.0  HGB 7.1* 7.0*  HCT 23.3* 23.4*  MCV 95.9 93.6  PLT 134* 120*   Basic Metabolic Panel Recent Labs    46/96/29 1540 06/08/23 0431  NA 136 136  K 4.1 4.3  CL 106 104  CO2 24 25  GLUCOSE 114* 82  BUN 31* 22*  CREATININE 4.81* 3.71*  CALCIUM 7.5* 7.4*  MG 2.0 2.0  PHOS 3.5 3.4   Liver Function Tests Recent Labs    06/07/23 1540  06/08/23 0431  ALBUMIN 1.6* 1.6*   No results for input(s): "LIPASE", "AMYLASE" in the last 72 hours. Cardiac Enzymes No results for input(s): "CKTOTAL", "CKMB", "CKMBINDEX", "TROPONINI" in the last 72 hours.  BNP: BNP (last 3 results) Recent Labs    01/19/23 1111 06/06/23 0311  BNP 769.3* 638.4*    ProBNP (last 3 results) No results for input(s): "PROBNP" in the last 8760 hours.   D-Dimer No results for input(s): "DDIMER" in the last 72 hours. Hemoglobin A1C Recent Labs    06/06/23 0720  HGBA1C 4.2*   Fasting Lipid Panel No results for input(s): "CHOL", "HDL", "LDLCALC", "TRIG", "CHOLHDL", "LDLDIRECT" in the last 72 hours. Thyroid Function Tests No results for input(s): "TSH", "T4TOTAL", "T3FREE", "THYROIDAB" in the last 72 hours.  Invalid input(s): "FREET3"  Other results:   Imaging    No results found.  Medications:   Scheduled Medications:  (feeding supplement) PROSource Plus  30 mL Oral BID BM   Chlorhexidine Gluconate Cloth  6 each Topical Daily   darbepoetin (ARANESP) injection - DIALYSIS  150 mcg Subcutaneous Q Sun-1800   heparin  5,000 Units Subcutaneous Q8H   insulin aspart  0-6 Units Subcutaneous TID WC   multivitamin  1 tablet Oral QHS   sodium chloride  flush  10-40 mL Intracatheter Q12H    Infusions:  amiodarone 30 mg/hr (06/08/23 0800)   prismasol BGK 4/2.5 400 mL/hr at 06/07/23 2219   prismasol BGK 4/2.5 400 mL/hr at 06/07/23 2219   prismasol BGK 4/2.5 1,500 mL/hr at 06/08/23 0817    PRN Medications: docusate sodium, heparin, polyethylene glycol, sodium chloride, sodium chloride flush Patient Profile   Jerry Edwards is a 53 y.o. with history of NICM, chronic systolic heart failure, HTN, OSA, CKD stage IV, and osteogenesis imperfecta. AHF team to see with AKI / CHF/  Assessment/Plan  1. AKI on CKD stage 4: Last creatinine 4.85 11/24, up to 12.82 on admission with marked hyperkalemia, metabolic acidosis, and suspect volume overload as  well. BP stable.  - Now on CRRT. Management per nephrology.  Suspect he can get [facility] later today if ok with nephrology.  - Suspect that he will need long-term RRT.  - Failed to follow up with OP nephrology referrals.   2. Acute on chronic Systolic Heart Failure: Nonischemic cardiomyopathy.  Echo 1/15 EF 25%. cMRI 12/15 EF 38% possible infiltrative disease with discrete areas of non-coronary pattern delayed enhancement. Coronaries were normal on LHC. Echo 9/23 with EF 30-35%, mild LVH, RV normal.   - Echo this admission EF 35-40%, LV with RWMA, RV normal. Mod MR.  - NYHA class III symptoms on admission - Exam is difficult for volume. CVP ~8. Co-ox 77%. - Nephrology following: UF 38mL/hr  - Now on CRRT as above, nephrology following - Holding meds while on CRRT  3.  Mitral Regurgitation: TEE 3/23 showed Mild P2 restriction with 2-3+ (mod-severe central and posterior MR). Findings were d/w structural heart team. Valve is amenable to clipping but not severe enough at this point.  Echo 9/23 showed only mild MR.  He does not have a prominent murmur. Echo 3/25 with Mod MR.   4. OHS/OSA On home O2,  2L [place].  Has known OSA on CPAP.   5.  Osteogenesis imperfecta: Wheelchair-bound.   6. OSA: Continue CPAP.   7. 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   8. Anemia - Hgb down to 7 today - Transfuse x1 uPRBC - Denies abnormal bleeding  CRITICAL CARE Performed by: Alen Bleacher  Total critical care time: 13 minutes  Critical care time was exclusive of separately billable procedures and treating other patients.  Critical care was necessary to treat or prevent imminent or life-threatening deterioration.  Critical care was time spent personally by me on the following activities: development of treatment plan with patient and/or surrogate as well as nursing, discussions with consultants, evaluation of patient's response to treatment, examination  of patient, obtaining history from patient or surrogate, ordering and performing treatments and interventions, ordering and review of laboratory studies, ordering and review of radiographic studies, pulse oximetry and re-evaluation of patient's condition.   Length of Stay: 2  Alen Bleacher, NP  06/08/2023, 9:21 AM  Advanced Heart Failure Team Pager [phone] (M-F; 7a - 5p)  Please contact [facility] for night-coverage after hours (5p -7a ) and weekends on [email or web address]  Agree with above.  Remains on CVVHD. Pulling -50. MAPs ok. K improved. CVP 8 Co-ox 77%  Denies CP or SOB. Hgb 7.1   General:  Sitting up in bed No resp difficulty HEENT: normal inverted lid on right Neck: supple. LIJ HD cath Carotids 2+ bilat; no bruits. No lymphadenopathy or thryomegaly appreciated. Cor: BArrel chested  Regular rate & rhythm. No rubs, gallops or murmurs. Lungs: clear Abdomen: obese soft, nontender, nondistended. No hepatosplenomegaly. No bruits or masses. Good bowel sounds. Extremities: no cyanosis, clubbing, rash, edema + osteogenesis imperfects Neuro: alert & orientedx3, cranial nerves grossly intact. moves all 4 extremities w/o difficulty. Affect pleasant  Stabilized with CVVHD. Suspect he can switch to iHD when filter runs out. We discussed that he will need placement of TDC and long-term HD. Will discuss with Renal.   Give 1u RBCs.   Repeat echo to reassess MR once more stable.   CRITICAL CARE Performed by: Arvilla Meres  Total critical care time: 40 minutes  Critical care time was exclusive of separately billable procedures and treating other patients.  Critical care was necessary to treat or prevent imminent or life-threatening deterioration.  Critical care was time spent personally by me (independent of midlevel providers or residents) on the following activities: development of treatment plan with patient and/or surrogate as well as nursing, discussions with consultants, evaluation  of patient's response to treatment, examination of patient, obtaining history from patient or surrogate, ordering and performing treatments and interventions, ordering and review of laboratory studies, ordering and review of radiographic studies, pulse oximetry and re-evaluation of patient's condition.  Arvilla Meres, MD  10:02 AM
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
## 2023-06-08 NOTE — Procedures (Signed)
 Vascular and Interventional Radiology Procedure Note  Patient: Jerry Edwards DOB: 05-17-1970 Medical Record Number: [id number] Note Date/Time: 06/08/23 3:22 PM   Performing Physician: Roanna Banning, MD Assistant(s): None  Diagnosis: ESRD requiring Hemodialysis  Procedure: TUNNELED HEMODIALYSIS CATHETER PLACEMENT  Anesthesia: Conscious Sedation Complications: None Estimated Blood Loss: Minimal Specimens:  None  Findings:  Successful placement of right-sided, 19 cm (tip-to-cuff), tunneled hemodialysis catheter with the tip of the catheter in the proximal right atrium.  Plan: Catheter ready for use.  See detailed procedure note with images in PACS. The patient tolerated the procedure well without incident or complication and was returned to Recovery in stable condition.    Roanna Banning, MD [facility]   Pager. [phone] Clinic. [phone]

## 2023-06-08 NOTE — Progress Notes (Signed)
   Notified by RN that patient was having pain around tunnel HD catheter site which was placed today. Patient has no PRN pain medications orders. Will order PRN Tylenol but will also place a one time order for Tramadol if needed.  Corrin Parker, PA-C 06/08/2023 8:23 PM

## 2023-06-08 NOTE — Progress Notes (Signed)
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 (HCC)    NICM (nonischemic cardiomyopathy) (HCC) 02/22/2016   a. prior concern for infiltrative disease. EF 25% in 2015. b. EF 35-40% in 09/2016.   Noncompliance with medication regimen    Osteogenesis imperfecta    Pulmonary hypertension (HCC)    a. felt primarily venous pulm HTN on prior RHC, may be component of PAH 2/2 OSA.   Wheelchair bound     PSH:   Past Surgical History:  Procedure Laterality Date   CHOLECYSTECTOMY     LEFT AND RIGHT HEART CATHETERIZATION WITH CORONARY ANGIOGRAM N/A 04/12/2013   Procedure: LEFT AND RIGHT HEART CATHETERIZATION WITH CORONARY ANGIOGRAM;  Surgeon: Iran Ouch, MD;  Location: [facility] CATH LAB;  Service: Cardiovascular;  Laterality: N/A;   LEG SURGERY     rods in both legs   RIGHT HEART CATH N/A 06/04/2021   Procedure: RIGHT HEART CATH;  Surgeon: Dolores Patty, MD;  Location: [facility] INVASIVE CV LAB;  Service: Cardiovascular;  Laterality: N/A;   TEE WITHOUT CARDIOVERSION N/A 06/05/2021   Procedure: TRANSESOPHAGEAL ECHOCARDIOGRAM (TEE);  Surgeon: Dolores Patty, MD;  Location: [facility] ENDOSCOPY;  Service: Cardiovascular;  Laterality: N/A;    Allergies:  Allergies  Allergen Reactions   Fish Allergy Anaphylaxis   Other Anaphylaxis and Other (See Comments)    NO NUTS!!!!!! Peanut are legumes!!   "No red meat- Does not eat"   Peanuts [Peanut Oil] Anaphylaxis   Shellfish Allergy  Anaphylaxis   Latex Itching   Pork-Derived Products Other (See Comments)    Does not eat- religious reasons    Medications:   Prior to Admission medications   Medication Sig Start Date End Date Taking? Authorizing Provider  aspirin 81 MG tablet Take 1 tablet (81 mg total) by mouth daily. 04/21/13  Yes Vassie Loll, MD  carvedilol (COREG) 12.5 MG tablet TAKE 1 TABLET BY MOUTH TWICE DAILY WITH A MEAL 03/26/23  Yes Bensimhon, Bevelyn Buckles, MD  doxazosin (CARDURA) 2 MG tablet TAKE 1 TABLET BY MOUTH AT BEDTIME 05/27/23  Yes Bensimhon, Bevelyn Buckles, MD  empagliflozin (JARDIANCE) 10 MG TABS tablet Take 1 tablet by mouth once daily 04/14/23  Yes Bensimhon, Bevelyn Buckles, MD  furosemide (LASIX) 80 MG tablet Take 80 mg in and 40 mg in pm 01/21/23  Yes Bensimhon, Bevelyn Buckles, MD  hydrALAZINE (APRESOLINE) 100 MG tablet Take 1 tablet (100 mg total) by mouth 3 (three) times daily. 01/28/23  Yes Bensimhon, Bevelyn Buckles, MD  isosorbide mononitrate (IMDUR) 30 MG 24 hr tablet Take 1 tablet by mouth once daily 05/27/23  Yes Bensimhon, Bevelyn Buckles, MD  Olopatadine HCl (PATADAY OP) Place 1 drop into both eyes in the morning and at bedtime.   Yes [provider]  OXYGEN Inhale 2 L into the lungs as needed  (shortness of breath).   Yes [provider]  potassium chloride SA (KLOR-CON M) 20 MEQ tablet Take 2 tablets by mouth once daily 12/08/22  Yes Andrey Farmer, PA-C  azithromycin (ZITHROMAX Z-PAK) 250 MG tablet 2 po day one, then 1 daily x 4 days Patient not taking: Reported on 06/06/2023 05/06/23   Fayrene Helper, PA-C  benzonatate (TESSALON) 100 MG capsule Take 1 capsule (100 mg total) by mouth every 8 (eight) hours. Patient not taking: Reported on 06/06/2023 05/06/23   Fayrene Helper, PA-C  erythromycin ophthalmic ointment Place 1 application into the right eye at bedtime. Patient not taking: Reported on 06/06/2023 05/01/21   [provider]    Discontinued Meds:   Medications Discontinued During This Encounter  Medication Reason   sodium bicarbonate 150 mEq in sterile water 1,150 mL infusion    PrismaSol BGK 2/3.5 infusion    PrismaSol BGK 2/3.5 infusion    PrismaSol BGK 2/3.5 infusion    insulin aspart (novoLOG) injection 0-15 Units    insulin aspart (novoLOG) injection 0-5 Units    sodium zirconium cyclosilicate (LOKELMA) packet 10 g    multivitamin (RENA-VIT) tablet 1 tablet     Social History:  reports that he has quit smoking. He has never used smokeless tobacco. He reports current alcohol use. He reports current drug use. Drug: Marijuana.  Family History:   Family History  Problem Relation Age of Onset   Hypertension Mother    Lung cancer Father    Cancer Father    Stroke Brother    Stroke Maternal Grandmother    Diabetes Maternal Grandmother    Diabetes Brother    Diabetes Maternal Aunt    Heart attack Neg Hx     Blood pressure 115/65, pulse 74, temperature 98.6 F (37 C), temperature source Axillary, resp. rate (!) 24, height 4\' 11"  (1.499 m), weight 100.3 kg, SpO2 96%. Physical Exam: General: body habitus c/w osteogenesis imperfecta, alert, knowledgeable about situation HEENT: inverted eyelid on the right-  not new Eyes: EOMI Neck: positive for  JVD Heart: RRR Lungs: CBS bilat  Abdomen: distended-  abdominal wall edema Extremities: pitting edema througout Skin: warm  and dry Neuro: alert, non focal  Access: LIJ temp     Ethelene Hal, MD 06/08/2023, 11:23 AM

## 2023-06-09 DIAGNOSIS — I5023 Acute on chronic systolic (congestive) heart failure: Secondary | ICD-10-CM | POA: Diagnosis not present

## 2023-06-09 DIAGNOSIS — N179 Acute kidney failure, unspecified: Secondary | ICD-10-CM | POA: Diagnosis not present

## 2023-06-09 LAB — TYPE AND SCREEN
ABO/RH(D): A POS
Antibody Screen: NEGATIVE
Unit division: 0

## 2023-06-09 LAB — BPAM RBC
Blood Product Expiration Date: 202504092359
ISSUE DATE / TIME: 202503181129
Unit Type and Rh: 6200

## 2023-06-09 LAB — RENAL FUNCTION PANEL
Albumin: 1.6 g/dL — ABNORMAL LOW (ref 3.5–5.0)
Albumin: 1.7 g/dL — ABNORMAL LOW (ref 3.5–5.0)
Anion gap: 9 (ref 5–15)
Anion gap: 9 (ref 5–15)
BUN: 29 mg/dL — ABNORMAL HIGH (ref 6–20)
BUN: 17 mg/dL (ref 6–20)
CO2: 25 mmol/L (ref 22–32)
CO2: 25 mmol/L (ref 22–32)
Calcium: 7.2 mg/dL — ABNORMAL LOW (ref 8.9–10.3)
Calcium: 7.3 mg/dL — ABNORMAL LOW (ref 8.9–10.3)
Chloride: 100 mmol/L (ref 98–111)
Chloride: 100 mmol/L (ref 98–111)
Creatinine, Ser: 5.01 mg/dL — ABNORMAL HIGH (ref 0.61–1.24)
Creatinine, Ser: 3.23 mg/dL — ABNORMAL HIGH (ref 0.61–1.24)
GFR, Estimated: 13 mL/min — ABNORMAL LOW (ref >60)
GFR, Estimated: 22 mL/min — ABNORMAL LOW (ref >60)
Glucose, Bld: 225 mg/dL — ABNORMAL HIGH (ref 70–99)
Glucose, Bld: 116 mg/dL — ABNORMAL HIGH (ref 70–99)
Phosphorus: 4.7 mg/dL — ABNORMAL HIGH (ref 2.5–4.6)
Phosphorus: 2.7 mg/dL (ref 2.5–4.6)
Potassium: 4.4 mmol/L (ref 3.5–5.1)
Potassium: 3.9 mmol/L (ref 3.5–5.1)
Sodium: 134 mmol/L — ABNORMAL LOW (ref 135–145)
Sodium: 134 mmol/L — ABNORMAL LOW (ref 135–145)

## 2023-06-09 LAB — COOXEMETRY PANEL
Carboxyhemoglobin: 1.1 % (ref 0.5–1.5)
Methemoglobin: <0.7 % (ref 0.0–1.5)
O2 Saturation: 75.9 %
Total hemoglobin: 8.3 g/dL — ABNORMAL LOW (ref 12.0–16.0)

## 2023-06-09 LAB — CBC
HCT: 26.7 % — ABNORMAL LOW (ref 39.0–52.0)
Hemoglobin: 8 g/dL — ABNORMAL LOW (ref 13.0–17.0)
MCH: 28.5 pg (ref 26.0–34.0)
MCHC: 30 g/dL (ref 30.0–36.0)
MCV: 95 fL (ref 80.0–100.0)
Platelets: 126 10*3/uL — ABNORMAL LOW (ref 150–400)
RBC: 2.81 MIL/uL — ABNORMAL LOW (ref 4.22–5.81)
RDW: 13.2 % (ref 11.5–15.5)
WBC: 6.4 10*3/uL (ref 4.0–10.5)
nRBC: 0 % (ref 0.0–0.2)

## 2023-06-09 LAB — GLUCOSE, CAPILLARY
Glucose-Capillary: 69 mg/dL — ABNORMAL LOW (ref 70–99)
Glucose-Capillary: 70 mg/dL (ref 70–99)
Glucose-Capillary: 85 mg/dL (ref 70–99)
Glucose-Capillary: 112 mg/dL — ABNORMAL HIGH (ref 70–99)
Glucose-Capillary: 94 mg/dL (ref 70–99)

## 2023-06-09 LAB — HEPATITIS B SURFACE ANTIBODY, QUANTITATIVE: Hep B S AB Quant (Post): <3.5 m[IU]/mL — ABNORMAL LOW

## 2023-06-09 LAB — MAGNESIUM: Magnesium: 2.2 mg/dL (ref 1.7–2.4)

## 2023-06-09 MED ORDER — PENTAFLUOROPROP-TETRAFLUOROETH EX AERO: 1.0000 | INHALATION_SPRAY | CUTANEOUS | Status: DC | PRN

## 2023-06-09 MED ORDER — LIDOCAINE HCL (PF) 1 % IJ SOLN: 5.0000 mL | INTRAMUSCULAR | Status: DC | PRN

## 2023-06-09 MED ORDER — TRAMADOL HCL 50 MG PO TABS
25.0000 mg | ORAL_TABLET | Freq: Once | ORAL | Status: AC
  Administered 2023-06-09: 25 mg via ORAL
  Filled 2023-06-09: qty 1

## 2023-06-09 MED ORDER — ANTICOAGULANT SODIUM CITRATE 4% (200MG/5ML) IV SOLN: 5.0000 mL | Status: DC | PRN

## 2023-06-09 MED ORDER — HEPARIN SODIUM (PORCINE) 1000 UNIT/ML IJ SOLN
3000.0000 [IU] | Freq: Once | INTRAMUSCULAR | Status: AC
  Administered 2023-06-09: 3000 [IU] via INTRAVENOUS
  Filled 2023-06-09: qty 3

## 2023-06-09 MED ORDER — AMIODARONE HCL 200 MG PO TABS
200.0000 mg | ORAL_TABLET | Freq: Two times a day (BID) | ORAL | Status: DC
  Administered 2023-06-09: 200 mg via ORAL
  Filled 2023-06-09: qty 1

## 2023-06-09 MED ORDER — ALTEPLASE 2 MG IJ SOLR: 2.0000 mg | Freq: Once | INTRAMUSCULAR | Status: DC | PRN

## 2023-06-09 MED ORDER — HEPARIN SODIUM (PORCINE) 1000 UNIT/ML DIALYSIS
1000.0000 [IU] | INTRAMUSCULAR | Status: DC | PRN
  Administered 2023-06-09: 3200 [IU]
  Filled 2023-06-09: qty 1

## 2023-06-09 MED ORDER — LIDOCAINE-PRILOCAINE 2.5-2.5 % EX CREA: 1.0000 | TOPICAL_CREAM | CUTANEOUS | Status: DC | PRN

## 2023-06-09 NOTE — Progress Notes (Signed)
 Received patient in bed to unit.  Alert and oriented.  Informed consent signed and in chart.   TX duration:3.5  Patient tolerated well.  Transported back to the room  Alert, without acute distress.  Hand-off given to patient's nurse.   Access used: RTDC Access issues: 'reverse flow' for tx duration due to arterial pressure alarms  Total UF removed: 2L  Medication(s) given: heparin bolus, post HD tx TDC heparin block   06/09/23 1151  Vitals  BP (!) 113/95  MAP (mmHg) 103  Pulse Rate 76  ECG Heart Rate 76  Resp (!) 21  Oxygen Therapy  SpO2 97 %  During Treatment Monitoring  Blood Flow Rate (mL/min) 399 mL/min  Arterial Pressure (mmHg) -173.73 mmHg  Venous Pressure (mmHg) 171.1 mmHg  TMP (mmHg) -1.01 mmHg  Ultrafiltration Rate (mL/min) 769 mL/min  Dialysate Flow Rate (mL/min) 299 ml/min  Duration of HD Treatment -hour(s) 3.46 hour(s)  Cumulative Fluid Removed (mL) per Treatment  1970.93  HD Safety Checks Performed Yes  Intra-Hemodialysis Comments Tx completed  Dialysis Fluid Bolus Normal Saline  Bolus Amount (mL) 300 mL  Post Treatment  Dialyzer Clearance Clear  Liters Processed 82  Fluid Removed (mL) 2000 mL  Tolerated HD Treatment Yes  Hemodialysis Catheter Right Internal jugular Double lumen Permanent (Tunneled)  Placement Date/Time: 06/08/23 1537   Serial / Lot #: [id number]  Expiration Date: 12/21/27  Time Out: Correct patient;Correct site;Correct procedure  Maximum sterile barrier precautions: Hand hygiene;Cap;Mask;Sterile gown;Sterile gloves;Large sterile s...  Site Condition No complications  Blue Lumen Status Flushed;Heparin locked  Red Lumen Status Flushed;Heparin locked  Catheter fill solution Heparin 1000 units/ml  Catheter fill volume (Arterial) 1.6 cc  Catheter fill volume (Venous) 1.6  Post treatment catheter status Capped and Clamped    Freddi Starr, RN [facility]

## 2023-06-09 NOTE — Progress Notes (Addendum)
 Advanced Heart Failure Rounding Note  Cardiologist: None  Chief Complaint:  Subjective:   -Started on CRRT 3/16 with K>7.5 and SCr 13 on admission.  Co-ox 76%.  SCr 3.71>5 today. Getting first round of iHD today. Now s/p RIJ TC  Hgb 8 today, got 1uPRBC yesterday for Hgb of 7.   Feels better today. Denies CP/SOB.  Objective:   Weight Range: 100.6 kg Body mass index is 44.79 kg/m.   Vital Signs:   Temp:  [97.8 F (36.6 C)-98.5 F (36.9 C)] 98.1 F (36.7 C) (03/19 0845) Pulse Rate:  [59-86] 70 (03/19 0945) Resp:  [12-26] 19 (03/19 0945) BP: (102-148)/(60-99) 123/76 (03/19 0945) SpO2:  [88 %-100 %] 97 % (03/19 0945) Weight:  [100.6 kg] 100.6 kg (03/19 0802) Last BM Date : 06/07/23  Weight change: Filed Weights   06/08/23 0600 06/09/23 0640 06/09/23 0802  Weight: 100.3 kg 100.6 kg 100.6 kg   Intake/Output:   Intake/Output Summary (Last 24 hours) at 06/09/2023 0958 Last data filed at 06/09/2023 0900 Gross per 24 hour  Intake 953.62 ml  Output 170 ml  Net 783.62 ml   Physical Exam  General:  Well appearing.  No respiratory difficulty (short stature 2/2 osteogenesis imperfecta) HEENT: normal. + glasses Neck: supple. JVD difficult to see.  Cor: PMI nondisplaced. Regular rate & rhythm. No rubs, gallops or murmurs. Lungs: clear Abdomen: obese, mildly distended. Good bowel sounds. Extremities: no cyanosis, clubbing, rash, edema  Neuro: alert & oriented x 3. Moves all 4 extremities w/o difficulty. Affect pleasant.  Telemetry   NSR 70s 0-2 PVCs/min (Personally reviewed)    EKG    No new EKG to review  Labs  CBC Recent Labs    06/08/23 0431 06/09/23 0436  WBC 6.0 6.4  HGB 7.0* 8.0*  HCT 23.4* 26.7*  MCV 93.6 95.0  PLT 120* 126*   Basic Metabolic Panel Recent Labs    40/98/11 0431 06/08/23 1718 06/09/23 0436  NA 136 137 134*  K 4.3 4.0 4.4  CL 104 107 100  CO2 25 23 25   GLUCOSE 82 104* 225*  BUN 22* 21* 29*  CREATININE 3.71* 3.97* 5.01*   CALCIUM 7.4* 7.2* 7.2*  MG 2.0  --  2.2  PHOS 3.4 3.9 4.7*   Liver Function Tests Recent Labs    06/08/23 1718 06/09/23 0436  ALBUMIN 1.7* 1.6*   No results for input(s): "LIPASE", "AMYLASE" in the last 72 hours. Cardiac Enzymes No results for input(s): "CKTOTAL", "CKMB", "CKMBINDEX", "TROPONINI" in the last 72 hours.  BNP: BNP (last 3 results) Recent Labs    01/19/23 1111 06/06/23 0311  BNP 769.3* 638.4*   ProBNP (last 3 results) No results for input(s): "PROBNP" in the last 8760 hours.  D-Dimer No results for input(s): "DDIMER" in the last 72 hours. Hemoglobin A1C No results for input(s): "HGBA1C" in the last 72 hours.  Fasting Lipid Panel No results for input(s): "CHOL", "HDL", "LDLCALC", "TRIG", "CHOLHDL", "LDLDIRECT" in the last 72 hours. Thyroid Function Tests No results for input(s): "TSH", "T4TOTAL", "T3FREE", "THYROIDAB" in the last 72 hours.  Invalid input(s): "FREET3"  Other results:  Imaging  IR Fluoro Guide CV Line Right Result Date: 06/08/2023 INDICATION: [id number] ESRD (end stage renal disease) (HCC) [id number] EXAM: TUNNELED CENTRAL VENOUS HEMODIALYSIS CATHETER PLACEMENT WITH ULTRASOUND AND FLUOROSCOPIC GUIDANCE MEDICATIONS: Ancef 2 gm IV. The antibiotic was given in an appropriate time interval prior to skin puncture. ANESTHESIA/SEDATION: Moderate (conscious) sedation was employed during this procedure. A total of Versed  1.5 mg and Fentanyl 50 mcg was administered intravenously. Moderate Sedation Time: 14 minutes. The patient's level of consciousness and vital signs were monitored continuously by radiology nursing throughout the procedure under my direct supervision. FLUOROSCOPY TIME:  Fluoroscopic dose; 5 mGy COMPLICATIONS: None immediate. PROCEDURE: Informed written consent was obtained from the patient after a discussion of the risks, benefits, and alternatives to treatment. Questions regarding the procedure were encouraged and answered. The RIGHT neck and  chest were prepped with chlorhexidine in a sterile fashion, and a sterile drape was applied covering the operative field. Maximum barrier sterile technique with sterile gowns and gloves were used for the procedure. A timeout was performed prior to the initiation of the procedure. After creating a small venotomy incision, a micropuncture kit was utilized to access the internal jugular vein. Real-time ultrasound guidance was utilized for vascular access including the acquisition of a permanent ultrasound image documenting patency of the accessed vessel. The microwire was utilized to measure appropriate catheter length. A stiff Glidewire was advanced to the level of the IVC and the micropuncture sheath was exchanged for a peel-away sheath. A palindrome tunneled hemodialysis catheter measuring 19 cm from tip to cuff was tunneled in a retrograde fashion from the anterior chest wall to the venotomy incision. The catheter was then placed through the peel-away sheath with tips ultimately positioned within the superior aspect of the right atrium. Final catheter positioning was confirmed and documented with a spot radiographic image. The catheter aspirates and flushes normally. The catheter was flushed with appropriate volume heparin dwells. The catheter exit site was secured with a 2-0 Ethilon retention suture. The venotomy incision was closed with Dermabond. Dressings were applied. The patient tolerated the procedure well without immediate post procedural complication. IMPRESSION: Successful placement of 19 cm tip to cuff tunneled hemodialysis catheter via the RIGHT internal jugular vein. The tip of the catheter is positioned at the superior cavo-atrial junction. The catheter is ready for immediate use. Roanna Banning, MD [facility] Electronically Signed   By: Roanna Banning M.D.   On: 06/08/2023 16:13   IR US Guide Vasc Access Right Result Date:  06/08/2023 INDICATION: [id number] ESRD (end stage renal disease) (HCC) [id number] EXAM: TUNNELED CENTRAL VENOUS HEMODIALYSIS CATHETER PLACEMENT WITH ULTRASOUND AND FLUOROSCOPIC GUIDANCE MEDICATIONS: Ancef 2 gm IV. The antibiotic was given in an appropriate time interval prior to skin puncture. ANESTHESIA/SEDATION: Moderate (conscious) sedation was employed during this procedure. A total of Versed 1.5 mg and Fentanyl 50 mcg was administered intravenously. Moderate Sedation Time: 14 minutes. The patient's level of consciousness and vital signs were monitored continuously by radiology nursing throughout the procedure under my direct supervision. FLUOROSCOPY TIME:  Fluoroscopic dose; 5 mGy COMPLICATIONS: None immediate. PROCEDURE: Informed written consent was obtained from the patient after a discussion of the risks, benefits, and alternatives to treatment. Questions regarding the procedure were encouraged and answered. The RIGHT neck and chest were prepped with chlorhexidine in a sterile fashion, and a sterile drape was applied covering the operative field. Maximum barrier sterile technique with sterile gowns and gloves were used for the procedure. A timeout was performed prior to the initiation of the procedure. After creating a small venotomy incision, a micropuncture kit was utilized to access the internal jugular vein. Real-time ultrasound guidance was utilized for vascular access including the acquisition of a permanent ultrasound image documenting patency of the accessed vessel. The microwire was utilized to measure appropriate catheter length. A stiff Glidewire was advanced to the  level of the IVC and the micropuncture sheath was exchanged for a peel-away sheath. A palindrome tunneled hemodialysis catheter measuring 19 cm from tip to cuff was tunneled in a retrograde fashion from the anterior chest wall to the venotomy incision. The catheter was then placed through the peel-away sheath with tips ultimately positioned  within the superior aspect of the right atrium. Final catheter positioning was confirmed and documented with a spot radiographic image. The catheter aspirates and flushes normally. The catheter was flushed with appropriate volume heparin dwells. The catheter exit site was secured with a 2-0 Ethilon retention suture. The venotomy incision was closed with Dermabond. Dressings were applied. The patient tolerated the procedure well without immediate post procedural complication. IMPRESSION: Successful placement of 19 cm tip to cuff tunneled hemodialysis catheter via the RIGHT internal jugular vein. The tip of the catheter is positioned at the superior cavo-atrial junction. The catheter is ready for immediate use. Roanna Banning, MD [facility] Radiology Electronically Signed   By: Roanna Banning M.D.   On: 06/08/2023 16:13    Medications:   Scheduled Medications:  (feeding supplement) PROSource Plus  30 mL Oral BID BM   Chlorhexidine Gluconate Cloth  6 each Topical Daily   Chlorhexidine Gluconate Cloth  6 each Topical Q0600   darbepoetin (ARANESP) injection - DIALYSIS  150 mcg Subcutaneous Q Sun-1800   heparin  5,000 Units Subcutaneous Q8H   insulin aspart  0-6 Units Subcutaneous TID WC   multivitamin  1 tablet Oral QHS   sodium chloride flush  10-40 mL Intracatheter Q12H    Infusions:  amiodarone 30 mg/hr (06/09/23 0900)   anticoagulant sodium citrate     prismasol BGK 4/2.5 400 mL/hr at 06/07/23 2219   prismasol BGK 4/2.5 400 mL/hr at 06/07/23 2219   prismasol BGK 4/2.5 1,500 mL/hr at 06/08/23 0817    PRN Medications: acetaminophen, alteplase, anticoagulant sodium citrate, docusate sodium, heparin, heparin, lidocaine (PF), lidocaine-prilocaine, pentafluoroprop-tetrafluoroeth, polyethylene glycol, sodium chloride, sodium chloride flush Patient Profile   Jerry Edwards is a 53 y.o. with history of NICM, chronic systolic heart failure, HTN, OSA, CKD  stage IV, and osteogenesis imperfecta. AHF team to see with AKI / CHF/  Assessment/Plan  1. AKI on CKD stage 4: Last creatinine 4.85 11/24, up to 12.82 on admission with marked hyperkalemia, metabolic acidosis, and suspect volume overload as well. BP stable.  - CRRT stopped yesterday. Tunneled HD cath placed yesterday. Getting first iHD round today.  - Failed to follow up with OP nephrology referrals.   2. Acute on chronic Systolic Heart Failure: Nonischemic cardiomyopathy.  Echo 1/15 EF 25%. cMRI 12/15 EF 38% possible infiltrative disease with discrete areas of non-coronary pattern delayed enhancement. Coronaries were normal on LHC. Echo 9/23 with EF 30-35%, mild LVH, RV normal.   - Echo this admission EF 35-40%, LV with RWMA, RV normal. Mod MR.  - NYHA class III symptoms on admission - Exam is difficult for volume.  Co-ox 76%. - Nephrology following: Now on iHD.  - Holding meds while on CRRT - Update echo   3.  Mitral Regurgitation: TEE 3/23 showed Mild P2 restriction with 2-3+ (mod-severe central and posterior MR). Findings were d/w structural heart team. Valve is amenable to clipping but not severe enough at this point.  Echo 9/23 showed only mild MR.  He does not have a prominent murmur. Echo 3/25 with Mod MR.  - Echo today post iHD  4. OHS/OSA On home O2,  2L [place].  Has known OSA on CPAP.   5.  Osteogenesis imperfecta: Wheelchair-bound.   6. OSA: Continue CPAP.   7. 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   8. NSVT - On admission 2/2 electrolyte abnormalities - Stop amiodarone gtt.  - No NSVT reoccurrence, minimal PVCs on tele review  9. Anemia - Transfused x1 uPRBC yesterday. Hgb 8 today - Denies abnormal bleeding   Length of Stay: 3  Alen Bleacher, NP  06/09/2023, 9:58 AM  Advanced Heart Failure Team Pager [phone] (M-F; 7a - 5p)  Please contact [facility] for night-coverage after hours (5p -7a ) and  weekends on [email or web address]   Patient seen and examined with the above-signed Advanced Practice Provider and/or Housestaff. I personally reviewed laboratory data, imaging studies and relevant notes. I independently examined the patient and formulated the important aspects of the plan. I have edited the note to reflect any of my changes or salient points. I have personally discussed the plan with the patient and/or family.  Tolerated iHD well. Denies CP or SOB. No further VT.  Co-ox 76%   General:  Sitting up in bed No resp difficulty HEENT: normal x for inverted R eyelid Neck: supple. no JVD. + TDC Cor: PMI nondisplaced. Regular rate & rhythm. No rubs, gallops or murmurs. Lungs: clear Abdomen: soft, nontender, nondistended. No hepatosplenomegaly. No bruits or masses. Good bowel sounds. Extremities: no cyanosis, clubbing, rash, edema + osteogenesis imperfecta Neuro: alert & orientedx3, cranial nerves grossly intact. moves all 4 extremities w/o difficulty. Affect pleasant  Tolerated iHD. He is stable for d/c from our standpoint. Can go to floor with TRH care.   Hold HF meds for now.   Arvilla Meres, MD  5:16 PM

## 2023-06-09 NOTE — Progress Notes (Signed)
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 05/06/23   Fayrene Helper, PA-C  benzonatate (TESSALON) 100 MG capsule Take 1 capsule (100 mg total) by mouth every 8 (eight) hours. Patient not taking: Reported on 06/06/2023 05/06/23   Fayrene Helper, PA-C  erythromycin ophthalmic ointment Place 1 application into the right eye at bedtime. Patient not taking: Reported on 06/06/2023 05/01/21   [provider]    Discontinued Meds:   Medications Discontinued During This Encounter  Medication Reason   sodium bicarbonate 150 mEq in sterile water 1,150 mL infusion    PrismaSol BGK 2/3.5 infusion    PrismaSol BGK 2/3.5 infusion    PrismaSol BGK 2/3.5 infusion    insulin aspart (novoLOG)  injection 0-15 Units    insulin aspart (novoLOG) injection 0-5 Units    sodium zirconium cyclosilicate (LOKELMA) packet 10 g    multivitamin (RENA-VIT) tablet 1 tablet     Social History:  reports that he has quit smoking. He has never used smokeless tobacco. He reports current alcohol use. He reports current drug use. Drug: Marijuana.  Family History:   Family History  Problem Relation Age of Onset   Hypertension Mother    Lung cancer Father    Cancer Father    Stroke Brother    Stroke Maternal Grandmother    Diabetes Maternal Grandmother    Diabetes Brother    Diabetes Maternal Aunt    Heart attack Neg Hx     Blood pressure 123/76, pulse 70, temperature 98.1 F (36.7 C), temperature source Oral, resp. rate 19, height 4\' 11"  (1.499 m), weight 100.6 kg, SpO2 97%. Physical Exam: General: body habitus c/w osteogenesis imperfecta, alert, knowledgeable about situation HEENT: inverted eyelid on the right-  not new Eyes: EOMI Neck: positive for JVD Heart: RRR Lungs: CBS bilat  Abdomen: distended-  abdominal wall edema Extremities: pitting edema througout Skin: warm and dry Neuro: alert, non focal  Access: LIJ temp, RIJ TC     Taray Normoyle, Len Blalock, MD 06/09/2023, 9:52 AM

## 2023-06-10 ENCOUNTER — Inpatient Hospital Stay (HOSPITAL_COMMUNITY)

## 2023-06-10 DIAGNOSIS — N189 Chronic kidney disease, unspecified: Secondary | ICD-10-CM

## 2023-06-10 DIAGNOSIS — I5023 Acute on chronic systolic (congestive) heart failure: Secondary | ICD-10-CM

## 2023-06-10 DIAGNOSIS — I10 Essential (primary) hypertension: Secondary | ICD-10-CM

## 2023-06-10 DIAGNOSIS — I5021 Acute systolic (congestive) heart failure: Secondary | ICD-10-CM | POA: Diagnosis not present

## 2023-06-10 DIAGNOSIS — E66813 Obesity, class 3: Secondary | ICD-10-CM

## 2023-06-10 DIAGNOSIS — Q780 Osteogenesis imperfecta: Secondary | ICD-10-CM

## 2023-06-10 DIAGNOSIS — N179 Acute kidney failure, unspecified: Secondary | ICD-10-CM | POA: Diagnosis not present

## 2023-06-10 LAB — ECHOCARDIOGRAM LIMITED
Calc EF: 36.6 %
Height: 59 in
MV M vel: 4.66 m/s
MV Peak grad: 86.9 mmHg
Radius: 0.7 cm
Single Plane A2C EF: 39 %
Single Plane A4C EF: 34.6 %
Weight: 3477.98 [oz_av]

## 2023-06-10 LAB — CBC
HCT: 28.4 % — ABNORMAL LOW (ref 39.0–52.0)
Hemoglobin: 8.8 g/dL — ABNORMAL LOW (ref 13.0–17.0)
MCH: 28.9 pg (ref 26.0–34.0)
MCHC: 31 g/dL (ref 30.0–36.0)
MCV: 93.1 fL (ref 80.0–100.0)
Platelets: 131 10*3/uL — ABNORMAL LOW (ref 150–400)
RBC: 3.05 MIL/uL — ABNORMAL LOW (ref 4.22–5.81)
RDW: 12.9 % (ref 11.5–15.5)
WBC: 7.4 10*3/uL (ref 4.0–10.5)
nRBC: 1.2 % — ABNORMAL HIGH (ref 0.0–0.2)

## 2023-06-10 LAB — RENAL FUNCTION PANEL
Albumin: 1.8 g/dL — ABNORMAL LOW (ref 3.5–5.0)
Anion gap: 6 (ref 5–15)
BUN: 27 mg/dL — ABNORMAL HIGH (ref 6–20)
CO2: 26 mmol/L (ref 22–32)
Calcium: 7.4 mg/dL — ABNORMAL LOW (ref 8.9–10.3)
Chloride: 101 mmol/L (ref 98–111)
Creatinine, Ser: 4.71 mg/dL — ABNORMAL HIGH (ref 0.61–1.24)
GFR, Estimated: 14 mL/min — ABNORMAL LOW (ref >60)
Glucose, Bld: 75 mg/dL (ref 70–99)
Phosphorus: 4.3 mg/dL (ref 2.5–4.6)
Potassium: 4.2 mmol/L (ref 3.5–5.1)
Sodium: 133 mmol/L — ABNORMAL LOW (ref 135–145)

## 2023-06-10 LAB — GLUCOSE, CAPILLARY
Glucose-Capillary: 65 mg/dL — ABNORMAL LOW (ref 70–99)
Glucose-Capillary: 72 mg/dL (ref 70–99)
Glucose-Capillary: 75 mg/dL (ref 70–99)
Glucose-Capillary: 92 mg/dL (ref 70–99)
Glucose-Capillary: 112 mg/dL — ABNORMAL HIGH (ref 70–99)

## 2023-06-10 LAB — MAGNESIUM: Magnesium: 1.9 mg/dL (ref 1.7–2.4)

## 2023-06-10 MED ORDER — TRAMADOL HCL 50 MG PO TABS: 50.0000 mg | ORAL_TABLET | Freq: Four times a day (QID) | ORAL | Status: DC | PRN

## 2023-06-10 MED ORDER — TRAMADOL HCL 50 MG PO TABS
50.0000 mg | ORAL_TABLET | Freq: Three times a day (TID) | ORAL | Status: DC | PRN
  Administered 2023-06-10 – 2023-06-18 (×4): 50 mg via ORAL
  Filled 2023-06-10 (×5): qty 1

## 2023-06-10 NOTE — Progress Notes (Signed)
 Requested to see pt for out-pt HD needs at d/c. Met with pt and pt's mother at bedside. Introduced self and explained role. Pt prefers clinic placement at [facility] [place] if possible. Referral submitted to [facility] admissions for review. Pt and pt's mother requesting assistance with transportation resources/options for transport to/from HD. Contacted CSW with pt and mother's request for assistance. Will assist as needed.  Olivia Canter Renal Navigator [phone]

## 2023-06-10 NOTE — Assessment & Plan Note (Addendum)
 Glucose has been stable will stop sliding scale and continue capillary glucose monitoring as needed.  Patient is tolerating po well.

## 2023-06-10 NOTE — Assessment & Plan Note (Addendum)
 Patient has progressed to ESRD. Hyponatremia.   Clinically euvolemic.  Pre HD BUN 64, K 5,0 and serum bicarbonate at 27  Na 137 P 5.7   Anemia of chronic renal disease. Continue with EPO.   Metabolic bone disease continue with sevelamer.

## 2023-06-10 NOTE — Progress Notes (Signed)
 Echocardiogram 2D Echocardiogram has been performed.  Warren Lacy Lopez Dentinger RDCS 06/10/2023, 10:25 AM

## 2023-06-10 NOTE — Progress Notes (Signed)
 Progress Note   Patient: Jerry Edwards ZOX:[id number] DOB: [birth year] DOA: 06/06/2023     4 DOS: the patient was seen and examined on 06/10/2023   Brief hospital course: Jerry Edwards was admitted to the hospital with the working diagnosis of worsening renal failure.   53 yo male with the past medical history of heart failure, osteogenesis imperfecta, CKD stage IV and OSA who presented with dyspnea and edema. Patient with progressive worsening renal function as outpatient. In the ED he had labored breathing, his blood pressure was 120/70, HR 86, RR 14 and 02 saturation 96% on supplemental 02 per [place].  Lungs with decreased breath sounds bilaterally with no wheezing or rhonchi, heart with S1 and S2 present and regular with no gallops or rubs, abdomen with no distention, and positive lower extremity edema.   Na 142, K >7,5 Cl 121, bicarbonate 12, glucose 85 bun 101 cr 13.0  High sensitive troponin 57 and 57  Wbc 6,0 hgb 8,5 plt 166  Sars covid 19 negative  Influenza negative   Chest radiograph with hypoinflation with cardiomegaly, bilateral hilar vascular congestion with bilateral interstitial infiltrates more predominantly at lower lobes.   EKG 77 bpm, left axis deviation, left bundle branch block, left anterior fascicular block, qtc 471, sinus rhythm with no significant ST segment or  T wave changes.   Patient was placed on CRRT for severe acidosis and hyperkalemia.  03/18 tunneled HD catheter.  03/19 transitioned to iHD.  03/20 HD scheduled MWF, pending outpatient CLIP.   Assessment and Plan: * Acute kidney injury superimposed on chronic kidney disease (HCC) Patient has progressed to ESRD. Hyponatremia.   Volume status has improved.  Na 133. BUN is at 27 and K at 4.2   Plan to continue with intermittent hemodialysis, schedule MWF.   Anemia of chronic renal disease. Continue with EPO.   Acute on chronic systolic heart failure (HCC) Echocardiogram with reduced LV systolic function with  EF 35 to 40%, RV systolic function preserved. Moderate mitral valve regurgitation.  LV with akinetic entire apex, hypokinetic anterior wall, antero lateral wall, anterior septum, inferior wall, posterior wall, mid infero septal segment and basal inferoseptal segment.   Volume status has improved with ultrafiltration.   Plan to continue renal replacement therapy.   Essential hypertension Continue blood pressure monitoring.  Ultrafiltration on HD   Type 2 diabetes mellitus (HCC) Continue glucose cover and monitoring with insulin sliding scale.  Patient is tolerating po well.   Osteogenesis imperfecta Continue PT and OT Noted old compression fractures.  Pain control with tramadol.   Class 3 obesity Calculated BMI is 43.3         Subjective: Patient with no chest pain or dyspnea, tolerating HD well.   Physical Exam: Vitals:   06/10/23 1100 06/10/23 1200 06/10/23 1300 06/10/23 1400  BP: 135/88 115/69 (!) 149/82 (!) 147/83  Pulse: 86 84 99 87  Resp: (!) 21 (!) 24 20 (!) 23  Temp: 98.2 F (36.8 C)     TempSrc: Oral     SpO2: 97% 99% 96% 97%  Weight:      Height:       Neurology awake and alert ENT with mild pallor with no icterus Cardiovascular with S1 and S2 present and regular with no gallops or rubs, positive systolic murmur at the apex No JVD Mild lower extremity edema Respiratory with no rales or wheezing, no rhonchi Abdomen with no distention  Data Reviewed:    Family Communication: I spoke with patient's mother  at the bedside, we talked in detail about patient's condition, plan of care and prognosis and all questions were addressed.   Disposition: Status is: Inpatient Remains inpatient appropriate because: renal replacement therapy, pending outpatient CLIP   Planned Discharge Destination: Home  Author: Coralie Keens, MD 06/10/2023 2:59 PM  For on call review [email or web address].

## 2023-06-10 NOTE — Assessment & Plan Note (Addendum)
 Echocardiogram with reduced LV systolic function with EF 35 to 40%, RV systolic function preserved. Moderate mitral valve regurgitation.  LV with akinetic entire apex, hypokinetic anterior wall, antero lateral wall, anterior septum, inferior wall, posterior wall, mid infero septal segment and basal inferoseptal segment.   Volume status has improved with ultrafiltration.  Continue with renal replacement therapy per nephrology recommendations.

## 2023-06-10 NOTE — Assessment & Plan Note (Signed)
 Calculated BMI is 43.3

## 2023-06-10 NOTE — Assessment & Plan Note (Signed)
 Continue PT and OT Noted old compression fractures.  Pain control with tramadol.

## 2023-06-10 NOTE — Hospital Course (Addendum)
 Jerry Edwards was admitted to the hospital with the working diagnosis of worsening renal failure.   53 yo male with the past medical history of heart failure, osteogenesis imperfecta, CKD stage IV and OSA who presented with dyspnea and edema. Patient with progressive worsening renal function as outpatient. In the ED he had labored breathing, his blood pressure was 120/70, HR 86, RR 14 and 02 saturation 96% on supplemental 02 per [place].  Lungs with decreased breath sounds bilaterally with no wheezing or rhonchi, heart with S1 and S2 present and regular with no gallops or rubs, abdomen with no distention, and positive lower extremity edema.   Na 142, K >7,5 Cl 121, bicarbonate 12, glucose 85 bun 101 cr 13.0  High sensitive troponin 57 and 57  Wbc 6,0 hgb 8,5 plt 166  Sars covid 19 negative  Influenza negative   Chest radiograph with hypoinflation with cardiomegaly, bilateral hilar vascular congestion with bilateral interstitial infiltrates more predominantly at lower lobes.   EKG 77 bpm, left axis deviation, left bundle branch block, left anterior fascicular block, qtc 471, sinus rhythm with no significant ST segment or  T wave changes.   Patient was placed on CRRT for severe acidosis and hyperkalemia.  03/18 tunneled HD catheter.  03/19 transitioned to iHD.  03/20 HD scheduled MWF, pending outpatient CLIP.  03/21 HD today.  03/23 pending outpatient HD arrangements.  03/24 HD today, pending CLIP.

## 2023-06-10 NOTE — Progress Notes (Signed)
 CSW met with pt regarding medicaid transportation.  Discussed signing up through [facility], contact information provided. Pt verbalized understanding. Daleen Squibb, MSW, LCSW 3/20/20253:26 PM

## 2023-06-10 NOTE — Assessment & Plan Note (Signed)
 Continue blood pressure monitoring.  Ultrafiltration on HD

## 2023-06-10 NOTE — Progress Notes (Signed)
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 attack Neg Hx     Blood pressure 120/84, pulse 89, temperature 97.9 F (36.6 C), temperature source Axillary, resp. rate (!) 23, height 4\' 11"  (1.499 m), weight 98.6 kg, SpO2 98%. Physical Exam: General: body habitus c/w osteogenesis imperfecta, alert, knowledgeable about situation HEENT: inverted eyelid on the right-  not new Eyes: EOMI Neck: positive for JVD Heart: RRR Lungs: CBS bilat  Abdomen: distended-  abdominal wall edema Extremities: pitting  edema througout Skin: warm and dry Neuro: alert, non focal  Access: RIJ TC     Ethelene Hal, MD 06/10/2023, 10:24 AM

## 2023-06-10 NOTE — Progress Notes (Addendum)
 Advanced Heart Failure Rounding Note  Cardiologist: None  Chief Complaint:  Subjective:   -Started on CRRT 3/16 with K>7.5 and SCr 13 on admission. CRRT stopped 3/18  Got first iHD yesterday.   Feels good today. Denies CP/SOB.  Objective:   Weight Range: 98.6 kg Body mass index is 43.9 kg/m.   Vital Signs:   Temp:  [97.9 F (36.6 C)-98.3 F (36.8 C)] 97.9 F (36.6 C) (03/20 0700) Pulse Rate:  [69-97] 97 (03/20 0800) Resp:  [17-32] 21 (03/20 0800) BP: (111-150)/(58-100) 126/77 (03/20 0800) SpO2:  [92 %-100 %] 93 % (03/20 0800) Weight:  [98.6 kg] 98.6 kg (03/20 0500) Last BM Date : 06/09/23  Weight change: Filed Weights   06/09/23 0802 06/09/23 1202 06/10/23 0500  Weight: 100.6 kg 98.6 kg 98.6 kg   Intake/Output:   Intake/Output Summary (Last 24 hours) at 06/10/2023 0919 Last data filed at 06/10/2023 0700 Gross per 24 hour  Intake 150.71 ml  Output 2050 ml  Net -1899.29 ml   Physical Exam  General: Well appearing.  No respiratory difficulty (short stature 2/2 osteogenesis imperfecta) HEENT: normal +glasses Neck: supple. JVD difficult to see. RIJ TDC Cor: PMI nondisplaced. Regular rate & rhythm. No rubs, gallops or murmurs. Lungs: clear Abdomen: obese. Good bowel sounds. Extremities: no cyanosis, clubbing, rash, edema  Neuro: alert & oriented x 3. Moves all 4 extremities w/o difficulty. Affect pleasant.  Telemetry   NSR 60s 2-4 PVCs/min (Personally reviewed)    EKG    No new EKG to review  Labs  CBC Recent Labs    06/09/23 0436 06/10/23 0353  WBC 6.4 7.4  HGB 8.0* 8.8*  HCT 26.7* 28.4*  MCV 95.0 93.1  PLT 126* 131*   Basic Metabolic Panel Recent Labs    86/57/84 0436 06/09/23 1611 06/10/23 0353  NA 134* 134* 133*  K 4.4 3.9 4.2  CL 100 100 101  CO2 25 25 26   GLUCOSE 225* 116* 75  BUN 29* 17 27*  CREATININE 5.01* 3.23* 4.71*  CALCIUM 7.2* 7.3* 7.4*  MG 2.2  --  1.9  PHOS 4.7* 2.7 4.3   Liver Function Tests Recent Labs     06/09/23 1611 06/10/23 0353  ALBUMIN 1.7* 1.8*   No results for input(s): "LIPASE", "AMYLASE" in the last 72 hours. Cardiac Enzymes No results for input(s): "CKTOTAL", "CKMB", "CKMBINDEX", "TROPONINI" in the last 72 hours.  BNP: BNP (last 3 results) Recent Labs    01/19/23 1111 06/06/23 0311  BNP 769.3* 638.4*   ProBNP (last 3 results) No results for input(s): "PROBNP" in the last 8760 hours.  D-Dimer No results for input(s): "DDIMER" in the last 72 hours. Hemoglobin A1C No results for input(s): "HGBA1C" in the last 72 hours.  Fasting Lipid Panel No results for input(s): "CHOL", "HDL", "LDLCALC", "TRIG", "CHOLHDL", "LDLDIRECT" in the last 72 hours. Thyroid Function Tests No results for input(s): "TSH", "T4TOTAL", "T3FREE", "THYROIDAB" in the last 72 hours.  Invalid input(s): "FREET3"  Other results:  Imaging  No results found.   Medications:   Scheduled Medications:  (feeding supplement) PROSource Plus  30 mL Oral BID BM   Chlorhexidine Gluconate Cloth  6 each Topical Daily   Chlorhexidine Gluconate Cloth  6 each Topical Q0600   darbepoetin (ARANESP) injection - DIALYSIS  150 mcg Subcutaneous Q Sun-1800   heparin  5,000 Units Subcutaneous Q8H   insulin aspart  0-6 Units Subcutaneous TID WC   multivitamin  1 tablet Oral QHS   sodium chloride  flush  10-40 mL Intracatheter Q12H    Infusions:  anticoagulant sodium citrate      PRN Medications: acetaminophen, alteplase, anticoagulant sodium citrate, docusate sodium, heparin, heparin, lidocaine (PF), lidocaine-prilocaine, pentafluoroprop-tetrafluoroeth, polyethylene glycol, sodium chloride, sodium chloride flush Patient Profile   Jerry Edwards is a 53 y.o. with history of NICM, chronic systolic heart failure, HTN, OSA, CKD stage IV, and osteogenesis imperfecta. AHF team to see with AKI / CHF/  Assessment/Plan  1. AKI on CKD stage 4: Last creatinine 4.85 11/24, up to 12.82 on admission with marked hyperkalemia,  metabolic acidosis, and suspect volume overload as well. BP stable.  - CRRT stopped 3/18. Tunneled HD cath placed 3/18. Started iHD 3/19.  - Needs seat in iHD arranged.  - Failed to follow up with OP nephrology referrals.   2. Acute on chronic Systolic Heart Failure: Nonischemic cardiomyopathy.  Echo 1/15 EF 25%. cMRI 12/15 EF 38% possible infiltrative disease with discrete areas of non-coronary pattern delayed enhancement. Coronaries were normal on LHC. Echo 9/23 with EF 30-35%, mild LVH, RV normal.   - Echo this admission EF 35-40%, LV with RWMA, RV normal. Mod MR.  - NYHA class III symptoms on admission - Exam is difficult for volume.  Co-ox 76%. - Nephrology following: Now on iHD.  - Holding meds while on CRRT - Update echo   3.  Mitral Regurgitation: TEE 3/23 showed Mild P2 restriction with 2-3+ (mod-severe central and posterior MR). Findings were d/w structural heart team. Valve is amenable to clipping but not severe enough at this point.  Echo 9/23 showed only mild MR.  He does not have a prominent murmur. Echo 3/25 with Mod MR.  - Echo today  4. OHS/OSA On home O2,  2L [place].  Has known OSA on CPAP.   5.  Osteogenesis imperfecta: Wheelchair-bound.   6. OSA: Continue CPAP.   7. 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   8. NSVT - On admission 2/2 electrolyte abnormalities - Stable off amiodarone - No NSVT reoccurrence. Some PVCs on tele review  9. Anemia - Transfused x1 uPRBC 3/18. Hgb 8.8 today - Denies abnormal bleeding  Transfer to floor. Needs chair arranged for iHD.    Length of Stay: 4  Alen Bleacher, NP  06/10/2023, 9:19 AM  Advanced Heart Failure Team Pager [phone] (M-F; 7a - 5p)  Please contact [facility] for night-coverage after hours (5p -7a ) and weekends on [email or web address]  Patient seen and examined with the above-signed Advanced Practice Provider and/or Housestaff. I personally reviewed laboratory  data, imaging studies and relevant notes. I independently examined the patient and formulated the important aspects of the plan. I have edited the note to reflect any of my changes or salient points. I have personally discussed the plan with the patient and/or family.  Tolerated iHD. Denies CP or SOB.   General:  Sitting up in bed  No resp difficulty HEENT: normal Neck: supple. no JVD. Carotids 2+ bilat; no bruits. No lymphadenopathy or thryomegaly appreciated. Cor: PMI nondisplaced. Regular rate & rhythm. No rubs, gallops or murmurs. Lungs: clear Abdomen: obese soft, nontender, nondistended. No hepatosplenomegaly. No bruits or masses. Good bowel sounds. Extremities: no cyanosis, clubbing, rash, edema + osteogenesis imperfect  Neuro: alert & orientedx3, cranial nerves grossly intact. moves all 4 extremities w/o difficulty. Affect pleasant   Echo today EF 25% Personally reviewed  Tolerating HD well.   He is stable for d/c from HF  perspective. Awaiting outpatient HD seat.   Given EF 35% may eventually benefit from switch to PD.  HF team will s/o. Call with questions/  Arvilla Meres, MD  2:57 PM

## 2023-06-10 NOTE — Progress Notes (Signed)
   06/10/23 0700  Provider Notification  Provider Name/Title Arrien York Ram  Date Provider Notified 06/10/23  Time Provider Notified 0701  Method of Notification Page  Notification Reason Critical Result  Test performed and critical result CBG  Date Critical Result Received 06/10/23  Time Critical Result Received 0630   CBG was 65 at 0630. Gave 4 orange juice and recheck was 72.

## 2023-06-10 NOTE — Progress Notes (Signed)
 Physical Therapy Evaluation Patient Details Name: Jerry Edwards MRN: [id number] DOB: 07/04/1970 Today's Date: 06/10/2023  History of Present Illness  53 y.o. male presents to [facility] 06/06/23 with SOB, swelling of hands and feet, and decreased urine output.Pt with acute renal failure, small B effusions, multiple compression deformities of lower thoracic and lumbar spine (no acute fx), multiple remote B rib fxs, and cardiomegaly with pulm vascular congestion. 3/16-3/18 CRRT. PMHx: osteogenesis imperfecta, HFrEF, CKD stage 4, pulmonary HTN, obesity   Clinical Impression  Pt in bed upon arrival and agreeable to PT eval. PTA, pt was independent for mobility/ADLs and would perform stand pivot to a WC with no AD. In today's session, pt was able to move from supine/sit with CGA for safety. Once in sitting, pt required a small step stool to rest feet on as pt is 4' 11''. Pt declined transfer in today's session with agreement to transfer in future therapy sessions. Pt was able to laterally scoot towards the [facility] by pushing with B LE's and UE. Pt had increased pain in L wrist while scooting. To return to supine, pt required slight MinA to complete moving LE's. Pt currently with functional limitations due to the deficits listed below (see PT Problem List). Pt would benefit from acute skilled PT to address functional impairments. Recommending post-acute HHPT pending ability to transfer to a recliner/WC with little assistance. Pt can have 24/7 physical assist available if needed upon d/c from the hospital. Acute PT to follow.  BP 115/69; 86 BPM; SpO2 98% on 1L      If plan is discharge home, recommend the following: A little help with walking and/or transfers;A little help with bathing/dressing/bathroom;Assist for transportation;Help with stairs or ramp for entrance;Assistance with cooking/housework     Equipment Recommendations None recommended by PT     Functional Status Assessment Patient has had a recent decline  in their functional status and demonstrates the ability to make significant improvements in function in a reasonable and predictable amount of time.     Precautions / Restrictions Precautions Precautions: Fall Precaution/Restrictions Comments: multiple chronic compression deformities in thoracic/lumbar spine, B rib fxs Restrictions Weight Bearing Restrictions Per Provider Order: No      Mobility  Bed Mobility Overal bed mobility: Needs Assistance Bed Mobility: Supine to Sit, Sit to Supine, Rolling Rolling: Supervision, Used rails   Supine to sit: Contact guard Sit to supine: Min assist   General bed mobility comments: pt able to roll bilaterallly using rails and supervision for safety, CGA for safety with sup/sit transfer. Pt prefers to sit up on the left side. Used small step stool underneath feet in sitting as pt is 4'11. Pt was able to laterally scoot using B LE's with CGA for safety (pain in L wrist with pushing). MinA to return to supine for slight LE management.    Transfers    General transfer comment: pt declined in today's session       Balance Overall balance assessment: Needs assistance, Mild deficits observed, not formally tested Sitting-balance support: Bilateral upper extremity supported, Feet supported Sitting balance-Leahy Scale: Fair        Pertinent Vitals/Pain Pain Assessment Pain Assessment: Faces Faces Pain Scale: Hurts little more Pain Location: L wrist and catheter insertion Pain Descriptors / Indicators: Aching, Discomfort Pain Intervention(s): Limited activity within patient's tolerance, Monitored during session, Repositioned    Home Living Family/patient expects to be discharged to:: Private residence Living Arrangements: Parent Available Help at Discharge: Family;Available 24 hours/day (parents work, however, could find  support) Type of Home: House Home Access: Ramped entrance      Home Layout: One level Home Equipment: Tub bench;Grab  bars - tub/shower;Grab bars - toilet;Wheelchair - manual      Prior Function Prior Level of Function : Independent/Modified Independent    Mobility Comments: performs stand pivots at baseline with no AD to manual WC ADLs Comments: independent     Extremity/Trunk Assessment   Upper Extremity Assessment Upper Extremity Assessment: Defer to OT evaluation    Lower Extremity Assessment Lower Extremity Assessment: Generalized weakness (grossly 4+/5 bilaterally, alert to light touch)    Cervical / Trunk Assessment Cervical / Trunk Assessment: Other exceptions;Kyphotic (hx of osteogenesis imperfecta)  Communication   Communication Communication: No apparent difficulties    Cognition Arousal: Alert Behavior During Therapy: WFL for tasks assessed/performed   PT - Cognitive impairments: No apparent impairments          Following commands: Intact       Cueing Cueing Techniques: Verbal cues     General Comments General comments (skin integrity, edema, etc.): Aunt and nephew present during session and supportive. Cleared by MD prior to session     PT Assessment Patient needs continued PT services  PT Problem List Decreased strength;Decreased activity tolerance;Decreased balance;Decreased mobility;Obesity       PT Treatment Interventions DME instruction;Functional mobility training;Therapeutic activities;Balance training;Therapeutic exercise;Neuromuscular re-education;Patient/family education    PT Goals (Current goals can be found in the Care Plan section)  Acute Rehab PT Goals Patient Stated Goal: to maintain strength while in the hospital PT Goal Formulation: With patient Time For Goal Achievement: 06/24/23 Potential to Achieve Goals: Good    Frequency Min 2X/week        AM-PAC PT "6 Clicks" Mobility  Outcome Measure Help needed turning from your back to your side while in a flat bed without using bedrails?: A Little Help needed moving from lying on your back to  sitting on the side of a flat bed without using bedrails?: A Little Help needed moving to and from a bed to a chair (including a wheelchair)?: A Lot Help needed standing up from a chair using your arms (e.g., wheelchair or bedside chair)?: A Lot Help needed to walk in hospital room?: Total Help needed climbing 3-5 steps with a railing? : Total 6 Click Score: 12    End of Session Equipment Utilized During Treatment: Oxygen Activity Tolerance: Patient tolerated treatment well Patient left: in bed;with call bell/phone within reach;with family/visitor present Nurse Communication: Mobility status PT Visit Diagnosis: Other abnormalities of gait and mobility (R26.89);Muscle weakness (generalized) (M62.81);Difficulty in walking, not elsewhere classified (R26.2)    Time: 4696-2952 PT Time Calculation (min) (ACUTE ONLY): 21 min   Charges:   PT Evaluation $PT Eval Low Complexity: 1 Low   PT General Charges $$ ACUTE PT VISIT: 1 Visit        Hilton Cork, PT, DPT Secure Chat Preferred  Rehab Office [phone]   Arturo Morton Brion Aliment 06/10/2023, 1:19 PM

## 2023-06-11 DIAGNOSIS — I5023 Acute on chronic systolic (congestive) heart failure: Secondary | ICD-10-CM | POA: Diagnosis not present

## 2023-06-11 DIAGNOSIS — N179 Acute kidney failure, unspecified: Secondary | ICD-10-CM | POA: Diagnosis not present

## 2023-06-11 DIAGNOSIS — E66813 Obesity, class 3: Secondary | ICD-10-CM | POA: Diagnosis not present

## 2023-06-11 DIAGNOSIS — I10 Essential (primary) hypertension: Secondary | ICD-10-CM | POA: Diagnosis not present

## 2023-06-11 LAB — RENAL FUNCTION PANEL
Albumin: 1.7 g/dL — ABNORMAL LOW (ref 3.5–5.0)
Anion gap: 9 (ref 5–15)
BUN: 40 mg/dL — ABNORMAL HIGH (ref 6–20)
CO2: 26 mmol/L (ref 22–32)
Calcium: 7.9 mg/dL — ABNORMAL LOW (ref 8.9–10.3)
Chloride: 101 mmol/L (ref 98–111)
Creatinine, Ser: 6.39 mg/dL — ABNORMAL HIGH (ref 0.61–1.24)
GFR, Estimated: 10 mL/min — ABNORMAL LOW (ref >60)
Glucose, Bld: 74 mg/dL (ref 70–99)
Phosphorus: 5.3 mg/dL — ABNORMAL HIGH (ref 2.5–4.6)
Potassium: 4.5 mmol/L (ref 3.5–5.1)
Sodium: 136 mmol/L (ref 135–145)

## 2023-06-11 LAB — GLUCOSE, CAPILLARY
Glucose-Capillary: 63 mg/dL — ABNORMAL LOW (ref 70–99)
Glucose-Capillary: 70 mg/dL (ref 70–99)
Glucose-Capillary: 100 mg/dL — ABNORMAL HIGH (ref 70–99)
Glucose-Capillary: 113 mg/dL — ABNORMAL HIGH (ref 70–99)
Glucose-Capillary: 88 mg/dL (ref 70–99)

## 2023-06-11 LAB — CBC
HCT: 26.7 % — ABNORMAL LOW (ref 39.0–52.0)
Hemoglobin: 8.2 g/dL — ABNORMAL LOW (ref 13.0–17.0)
MCH: 28.9 pg (ref 26.0–34.0)
MCHC: 30.7 g/dL (ref 30.0–36.0)
MCV: 94 fL (ref 80.0–100.0)
Platelets: 147 10*3/uL — ABNORMAL LOW (ref 150–400)
RBC: 2.84 MIL/uL — ABNORMAL LOW (ref 4.22–5.81)
RDW: 12.9 % (ref 11.5–15.5)
WBC: 7.4 10*3/uL (ref 4.0–10.5)
nRBC: 0.9 % — ABNORMAL HIGH (ref 0.0–0.2)

## 2023-06-11 LAB — MAGNESIUM: Magnesium: 1.9 mg/dL (ref 1.7–2.4)

## 2023-06-11 MED ORDER — SEVELAMER CARBONATE 800 MG PO TABS
800.0000 mg | ORAL_TABLET | Freq: Three times a day (TID) | ORAL | Status: DC
  Administered 2023-06-11 – 2023-06-15 (×14): 800 mg via ORAL
  Filled 2023-06-11 (×14): qty 1

## 2023-06-11 MED ORDER — HEPARIN SODIUM (PORCINE) 1000 UNIT/ML DIALYSIS: 3000.0000 [IU] | Freq: Once | INTRAMUSCULAR | Status: DC

## 2023-06-11 NOTE — Progress Notes (Signed)
 Pt's referral to [facility] for out-pt HD pending MD/clinic approval. Will provide update to pt and team once final approval/schedule is received. Will assist as needed.   Olivia Canter Renal Navigator [phone]

## 2023-06-11 NOTE — Progress Notes (Signed)
   06/11/23 2147  BiPAP/CPAP/SIPAP  $ Non-Invasive Ventilator  Non-Invasive Vent Subsequent  BiPAP/CPAP/SIPAP Resmed  Mask Type Full face mask  Patient Home Equipment No  Auto Titrate Yes
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
## 2023-06-11 NOTE — Progress Notes (Addendum)
 Nutrition Follow Up  DOCUMENTATION CODES:  Not applicable  INTERVENTION:  Provided education handouts on foods high in phosphorus, the renal disease grocery list, eating with kidney disease, and resources for kidney disease.   Liberalize diet from heart healthy/carb modified to regular to increase options available and encourage intake that meets protein and calorie needs  Snacks BID between meals (2pm and 8pm) to encourage small frequent meals that help meet pt's protein needs  RenaVit daily to help repletion of water soluble vitamins lost in CRRT  Prosource BID daily to help meet increased protein needs  NUTRITION DIAGNOSIS:  Increased nutrient needs related to acute illness, chronic illness (acute- CRRT; chronic- CHF) as evidenced by estimated needs.  GOAL:  Patient will meet greater than or equal to 90% of their needs  MONITOR:  PO intake, Supplement acceptance, I & O's, Labs  REASON FOR ASSESSMENT:   Consult Assessment of nutrition requirement/status  ASSESSMENT:   Pt with hx of osteogenesis imperfecta, CHF, HTN and CKD IV who is wheelchair bound at baseline. Pt admitted after experiencing SOB, edema, and decreased UOP for 2 weeks, diagnosed AKI and acute on chronic heart failure.  3/16 Admitted, CRRT initiated  3/18 CRRT stopped 3/19 HD #1 3/21 HD #2  Pt diagnosed with renal osteodystrophy, prescribed phosphate binder. Transitioned from CRRT to HD MWF. Awaiting CLIP for outpatient HD.  Spoke with pt who was awake but pt visibly fatigued following HD treatment, but wanted education provided. Pt asked about what foods he should be eating while on dialysis. Provided handouts on foods high in phosphorus, the renal disease grocery list, eating with kidney disease, and resources for kidney disease. Discussed each handout with pt and answered questions.  Pt reports he has been eating well with diet liberalization, but appetite feels poorer after HD treatments. Encouraged pt  that it is normal and encouraged him to try to eat what he can. Discussed ways that pt can meet calorie and protein goals on dialysis days and encouraged him to be sure to eat well on non-dialysis days as well. Pt reports he has been tolerating HD treatments fine but is just tired after, reports no cramping or nausea with treatments. Pt reports he is tolerating Prosource and will continue to take it as offered.  Pt put on phosphorus binder, recommend continuing to monitor phosphate levels and if not continuously high, may be able to discontinue.  Told pt he can share information with his mother and if she has any questions she can reach out to the RD team.  Admit weight: 103.2kg  Current weight: 99.5kg  UOP:  HD Output: 2L Total Net since admit: -6L  Nutritionally Relevant Medications: RenaVit Renvela Prosource Aranesp   Labs Reviewed: BUN 40 Creatinine 6.39 Phosphorus 5.3 Potassium 4.5 CBG ranges from 65-112 mg/dL over the last 24 hours HgbA1c 4.2 Hgb 8.2    Diet Order:   Diet Order             Diet regular Room service appropriate? Yes; Fluid consistency: Thin  Diet effective now                  EDUCATION NEEDS:  Education needs have been addressed  Skin:  Skin Assessment: Reviewed RN Assessment  Last BM:  3/20 last bm  Height:  Ht Readings from Last 1 Encounters:  06/07/23 4\' 11"  (1.499 m)   Weight:  Wt Readings from Last 1 Encounters:  06/11/23 99.5 kg   BMI:  Body mass index  is 44.3 kg/m.  Estimated Nutritional Needs:   Kcal:  1800-2000  Protein:  100-125g  Fluid:  1L + UOP  Jerry Edwards Dietetic Intern

## 2023-06-11 NOTE — Progress Notes (Signed)
 OT Cancellation Note  Patient Details Name: Jerry Edwards MRN: [id number] DOB: 08/21/1970   Cancelled Treatment:    Reason Eval/Treat Not Completed: Patient at procedure or test/ unavailable (Pt currently off unit for HD. OT to reattempt to see pt for OT eval at a later time as appropriate/available.)  Jerry Edwards "[name]" M., OTR/L, MA Acute Rehab [phone]  [name] 06/11/2023, 9:08 AM

## 2023-06-11 NOTE — Progress Notes (Signed)
 Progress Note   Patient: Jerry Edwards EPP:[id number] DOB: 04-30-1970 DOA: 06/06/2023     5 DOS: the patient was seen and examined on 06/11/2023   Brief hospital course: [name] was admitted to the hospital with the working diagnosis of worsening renal failure.   53 yo male with the past medical history of heart failure, osteogenesis imperfecta, CKD stage IV and OSA who presented with dyspnea and edema. Patient with progressive worsening renal function as outpatient. In the ED he had labored breathing, his blood pressure was 120/70, HR 86, RR 14 and 02 saturation 96% on supplemental 02 per [place].  Lungs with decreased breath sounds bilaterally with no wheezing or rhonchi, heart with S1 and S2 present and regular with no gallops or rubs, abdomen with no distention, and positive lower extremity edema.   Na 142, K >7,5 Cl 121, bicarbonate 12, glucose 85 bun 101 cr 13.0  High sensitive troponin 57 and 57  Wbc 6,0 hgb 8,5 plt 166  Sars covid 19 negative  Influenza negative   Chest radiograph with hypoinflation with cardiomegaly, bilateral hilar vascular congestion with bilateral interstitial infiltrates more predominantly at lower lobes.   EKG 77 bpm, left axis deviation, left bundle branch block, left anterior fascicular block, qtc 471, sinus rhythm with no significant ST segment or  T wave changes.   Patient was placed on CRRT for severe acidosis and hyperkalemia.  03/18 tunneled HD catheter.  03/19 transitioned to iHD.  03/20 HD scheduled MWF, pending outpatient CLIP.  03/21 HD today.   Assessment and Plan: * Acute kidney injury superimposed on chronic kidney disease (HCC) Patient has progressed to ESRD. Hyponatremia.   Volume status has improved.  HD today, pre HD bun 40 with K at 4,5 and serum bicarbonate at 26 Na 136   Anemia of chronic renal disease. Continue with EPO.   Acute on chronic systolic heart failure (HCC) Echocardiogram with reduced LV systolic function with EF 35 to  40%, RV systolic function preserved. Moderate mitral valve regurgitation.  LV with akinetic entire apex, hypokinetic anterior wall, antero lateral wall, anterior septum, inferior wall, posterior wall, mid infero septal segment and basal inferoseptal segment.   Volume status has improved with ultrafiltration.   Plan to continue renal replacement therapy.   Essential hypertension Continue blood pressure monitoring.  Ultrafiltration on HD   Type 2 diabetes mellitus (HCC) Continue glucose cover and monitoring with insulin sliding scale.  Patient is tolerating po well.   Osteogenesis imperfecta Continue PT and OT Noted old compression fractures.  Pain control with tramadol.   Class 3 obesity Calculated BMI is 43.3         Subjective: Patient with improvement in dyspnea, today with generalized weakness after HD, no chest pain   Physical Exam: Vitals:   06/11/23 1745 06/11/23 1800 06/11/23 1815 06/11/23 1830  BP:  (!) 112/98    Pulse: 83 84 85 85  Resp: (!) 21 20 18  (!) 25  Temp:      TempSrc:      SpO2: 96% 97% 97% 98%  Weight:      Height:       Neurology awake and alert ENT with mild pallor Cardiovascular with S1 and S2 present and regular with no gallops or rubs Respiratory with no wheezing, rhonchi or rales on anterior auscultation  Abdomen with no distention  No lower extremity edema  Data Reviewed:    Family Communication: I spoke with patient's mother at the bedside, we talked in detail  about patient's condition, plan of care and prognosis and all questions were addressed.   Disposition: Status is: Inpatient Remains inpatient appropriate because: pending outpatient HD CLIP   Planned Discharge Destination: Home     Author: Coralie Keens, MD 06/11/2023 7:19 PM  For on call review [email or web address].

## 2023-06-12 DIAGNOSIS — I10 Essential (primary) hypertension: Secondary | ICD-10-CM | POA: Diagnosis not present

## 2023-06-12 DIAGNOSIS — N189 Chronic kidney disease, unspecified: Secondary | ICD-10-CM | POA: Diagnosis not present

## 2023-06-12 DIAGNOSIS — I5023 Acute on chronic systolic (congestive) heart failure: Secondary | ICD-10-CM | POA: Diagnosis not present

## 2023-06-12 DIAGNOSIS — N179 Acute kidney failure, unspecified: Secondary | ICD-10-CM | POA: Diagnosis not present

## 2023-06-12 LAB — GLUCOSE, CAPILLARY
Glucose-Capillary: 67 mg/dL — ABNORMAL LOW (ref 70–99)
Glucose-Capillary: 96 mg/dL (ref 70–99)
Glucose-Capillary: 96 mg/dL (ref 70–99)
Glucose-Capillary: 97 mg/dL (ref 70–99)
Glucose-Capillary: 107 mg/dL — ABNORMAL HIGH (ref 70–99)

## 2023-06-12 LAB — RENAL FUNCTION PANEL
Albumin: 1.9 g/dL — ABNORMAL LOW (ref 3.5–5.0)
Anion gap: 8 (ref 5–15)
BUN: 24 mg/dL — ABNORMAL HIGH (ref 6–20)
CO2: 27 mmol/L (ref 22–32)
Calcium: 8 mg/dL — ABNORMAL LOW (ref 8.9–10.3)
Chloride: 98 mmol/L (ref 98–111)
Creatinine, Ser: 5.02 mg/dL — ABNORMAL HIGH (ref 0.61–1.24)
GFR, Estimated: 13 mL/min — ABNORMAL LOW (ref >60)
Glucose, Bld: 77 mg/dL (ref 70–99)
Phosphorus: 3.8 mg/dL (ref 2.5–4.6)
Potassium: 4.2 mmol/L (ref 3.5–5.1)
Sodium: 133 mmol/L — ABNORMAL LOW (ref 135–145)

## 2023-06-12 MED ORDER — ORAL CARE MOUTH RINSE: 15.0000 mL | OROMUCOSAL | Status: DC | PRN
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
## 2023-06-12 NOTE — Evaluation (Signed)
 Occupational Therapy Evaluation Patient Details Name: Jerry Edwards MRN: [id number] DOB: 10-17-1970 Today's Date: 06/12/2023   History of Present Illness   53 y.o. male presents to [facility] 06/06/23 with SOB, swelling of hands and feet, and decreased urine output.Pt with acute renal failure, small B effusions, multiple compression deformities of lower thoracic and lumbar spine (no acute fx), multiple remote B rib fxs, and cardiomegaly with pulm vascular congestion. 3/16-3/18 CRRT. PMHx: osteogenesis imperfecta, HFrEF, CKD stage 4, pulmonary HTN, obesity     Clinical Impressions At baseline, pt is Independent to Mod I with ADLs, IADLs, stand-pivot transfers, and functional mobility with use of manual wheelchair. Pt now presents with decreased activity tolerance, decreased balance, impaired cardiopulmonary status, decreased B UE strength, edematous B forearms and upper arms, and decreased safety and independence with functional tasks. Pt currently demonstrates ability to complete UB ADLs with Set up to Min assist, LB ADLs with Contact guard to Min assist, and bed mobility with Supervision to Contact guard assist. Pt's HR in the 70s to 80 and O2 sat >/93% on 2L contnuous O2 through nasal cannula throughout session. Pt participated well in session and is motivated to return to baseline PLOF. Pt will benefit from acute skilled OT services to address deficits outlined below and to increase safety and independence with functional tasks. Post acute discharge, pt will benefit from [facility] OT to maximize rehab potential.      If plan is discharge home, recommend the following:   A lot of help with walking and/or transfers;A little help with bathing/dressing/bathroom;Assistance with cooking/housework;Assist for transportation;Help with stairs or ramp for entrance     Functional Status Assessment   Patient has had a recent decline in their functional status and demonstrates the ability to make significant  improvements in function in a reasonable and predictable amount of time.     Equipment Recommendations   None recommended by OT (Pt already has needed equipment)     Recommendations for Other Services         Precautions/Restrictions   Precautions Precautions: Fall Recall of Precautions/Restrictions: Intact Precaution/Restrictions Comments: multiple chronic compression deformities in thoracic/lumbar spine, B rib fxs Restrictions Weight Bearing Restrictions Per Provider Order: No     Mobility Bed Mobility Overal bed mobility: Needs Assistance Bed Mobility: Supine to Sit, Rolling Rolling: Supervision, Used rails   Supine to sit: Contact guard, HOB elevated, Used rails     General bed mobility comments: Pt prefers to sit up on the left side. Used small step stool underneath feet in sitting as pt is 4'11    Transfers                   General transfer comment: pt declined in today's session      Balance Overall balance assessment: Needs assistance, Mild deficits observed, not formally tested Sitting-balance support: Single extremity supported, Bilateral upper extremity supported, No upper extremity supported, Feet supported Sitting balance-Leahy Scale: Fair Sitting balance - Comments: Pt initally requiring CGA to maintain balance sitting EOB but fading to Supervision with no assist needed. With step stool in place to supprot B feet, pt able to sit at EOB >10 minutes with with no LOB noted with pt benefiting from unilateral UE support in dynamic sitting.                                   ADL either performed or assessed with clinical judgement  ADL Overall ADL's : Needs assistance/impaired Eating/Feeding: Set up;Sitting   Grooming: Set up;Sitting   Upper Body Bathing: Minimal assistance;Sitting   Lower Body Bathing: Minimal assistance;Sitting/lateral leans   Upper Body Dressing : Contact guard assist;Sitting   Lower Body Dressing:  Minimal assistance;Sitting/lateral leans     Toilet Transfer Details (indicate cue type and reason): pt declined addressing transfers this session due to fatigue Toileting- Clothing Manipulation and Hygiene: Contact guard assist;Sitting/lateral lean Toileting - Clothing Manipulation Details (indicate cue type and reason): simulated at EOB       General ADL Comments: Pt with decreased activity tolerance, fatiguing quickly during tasks     Vision Baseline Vision/History: 1 Wears glasses Ability to See in Adequate Light: 0 Adequate (with glasses) Patient Visual Report: No change from baseline       Perception         Praxis         Pertinent Vitals/Pain Pain Assessment Pain Assessment: Faces Faces Pain Scale: Hurts little more Pain Location: generalized Pain Descriptors / Indicators: Aching, Discomfort, Grimacing Pain Intervention(s): Limited activity within patient's tolerance, Monitored during session, Repositioned     Extremity/Trunk Assessment Upper Extremity Assessment Upper Extremity Assessment: Right hand dominant;Generalized weakness;RUE deficits/detail;LUE deficits/detail RUE Deficits / Details: gernealized weakness; edematous forearm and upper arm LUE Deficits / Details: gernealized weakness; edematous forearm and upper arm   Lower Extremity Assessment Lower Extremity Assessment: Defer to PT evaluation   Cervical / Trunk Assessment Cervical / Trunk Assessment: Other exceptions;Kyphotic (hx of osteogenesis imperfecta) Cervical / Trunk Exceptions: multiple chronic compression deformities in thoracic/lumbar spine, B rib fxs   Communication Communication Communication: No apparent difficulties   Cognition Arousal: Alert Behavior During Therapy: WFL for tasks assessed/performed Cognition: No apparent impairments             OT - Cognition Comments: Pt AAOx4 and pleasant throughout session.                 Following commands: Intact        Cueing  General Comments   Cueing Techniques: Verbal cues  HR in the 70s to 80 and O2 sat >/93% on 2L contnuous O2 through nasal cannula throughout session. Pt's mother present througout session. RN present during a portion of session.   Exercises     Shoulder Instructions      Home Living Family/patient expects to be discharged to:: Private residence Living Arrangements: Parent Available Help at Discharge: Family;Available 24 hours/day (parents work, but pt reprots others available to support if needed) Type of Home: House Home Access: Ramped entrance     Home Layout: One level     Bathroom Shower/Tub: Chief Strategy Officer: Standard Bathroom Accessibility: Yes How Accessible: Accessible via wheelchair Home Equipment: Tub bench;Grab bars - tub/shower;Grab bars - toilet;Wheelchair - manual; O2 equipment          Prior Functioning/Environment Prior Level of Function : Independent/Modified Independent             Mobility Comments: performs stand pivots at baseline with no AD to manual WC ADLs Comments: independent to Mod I with ADLs and IADLs. Pt is a musician and DJs occasionally.    OT Problem List: Decreased strength;Decreased activity tolerance   OT Treatment/Interventions: Self-care/ADL training;Therapeutic exercise;DME and/or AE instruction;Therapeutic activities;Patient/family education;Balance training;Energy conservation      OT Goals(Current goals can be found in the care plan section)   Acute Rehab OT Goals Patient Stated Goal: to feel better, have more energy,  and continue to be independent OT Goal Formulation: With patient Time For Goal Achievement: 06/26/23 Potential to Achieve Goals: Good ADL Goals Pt Will Perform Lower Body Bathing: with supervision;sitting/lateral leans Pt Will Perform Upper Body Dressing: sitting;Independently Pt Will Perform Lower Body Dressing: with supervision;sitting/lateral leans Pt Will Transfer to  Toilet: with contact guard assist;stand pivot transfer;regular height toilet;grab bars (with least restrictive AD) Pt Will Perform Toileting - Clothing Manipulation and hygiene: Independently;sitting/lateral leans Pt Will Perform Tub/Shower Transfer: with contact guard assist;tub bench;Stand pivot transfer (with least restrictive AD) Pt/caregiver will Perform Home Exercise Program: Increased strength;Both right and left upper extremity;Independently;With written HEP provided (Increased activity tolerance)   OT Frequency:  Min 2X/week    Co-evaluation              AM-PAC OT "6 Clicks" Daily Activity     Outcome Measure Help from another person eating meals?: A Little Help from another person taking care of personal grooming?: A Little Help from another person toileting, which includes using toliet, bedpan, or urinal?: A Little Help from another person bathing (including washing, rinsing, drying)?: A Little Help from another person to put on and taking off regular upper body clothing?: A Little Help from another person to put on and taking off regular lower body clothing?: A Little 6 Click Score: 18   End of Session Equipment Utilized During Treatment: Oxygen Nurse Communication: Mobility status;Other (comment) (Pt sitting EOB with mother present and call bell in reach)  Activity Tolerance: Patient tolerated treatment well;Patient limited by fatigue Patient left: in bed;with call bell/phone within reach;with family/visitor present (sitting EOB to eat meal)  OT Visit Diagnosis: Other abnormalities of gait and mobility (R26.89);Muscle weakness (generalized) (M62.81);Other (comment) (decreased activity tolerance)                Time: 1249-1310 OT Time Calculation (min): 21 min Charges:  OT General Charges $OT Visit: 1 Visit OT Evaluation $OT Eval Low Complexity: 1 Low  Cierrah Dace "Kyle" M., OTR/L, MA Acute Rehab [phone]  [name] 06/12/2023, 7:01 PM

## 2023-06-12 NOTE — Progress Notes (Addendum)
 Progress Note   Patient: Jerry Edwards NWG:[id number] DOB: [birth year] DOA: 06/06/2023     6 DOS: the patient was seen and examined on 06/12/2023   Brief hospital course: Mr. Bady was admitted to the hospital with the working diagnosis of worsening renal failure.   53 yo male with the past medical history of heart failure, osteogenesis imperfecta, CKD stage IV and OSA who presented with dyspnea and edema. Patient with progressive worsening renal function as outpatient. In the ED he had labored breathing, his blood pressure was 120/70, HR 86, RR 14 and 02 saturation 96% on supplemental 02 per [place].  Lungs with decreased breath sounds bilaterally with no wheezing or rhonchi, heart with S1 and S2 present and regular with no gallops or rubs, abdomen with no distention, and positive lower extremity edema.   Na 142, K >7,5 Cl 121, bicarbonate 12, glucose 85 bun 101 cr 13.0  High sensitive troponin 57 and 57  Wbc 6,0 hgb 8,5 plt 166  Sars covid 19 negative  Influenza negative   Chest radiograph with hypoinflation with cardiomegaly, bilateral hilar vascular congestion with bilateral interstitial infiltrates more predominantly at lower lobes.   EKG 77 bpm, left axis deviation, left bundle branch block, left anterior fascicular block, qtc 471, sinus rhythm with no significant ST segment or  T wave changes.   Patient was placed on CRRT for severe acidosis and hyperkalemia.  03/18 tunneled HD catheter.  03/19 transitioned to iHD.  03/20 HD scheduled MWF, pending outpatient CLIP.  03/21 HD today.   Assessment and Plan: * Acute kidney injury superimposed on chronic kidney disease (HCC) Patient has progressed to ESRD. Hyponatremia.   Clinically euvolemic.  Na 13, K 4.2 bicarbonate 28 and Na 133 BUN 24   Anemia of chronic renal disease. Continue with EPO.   Acute on chronic systolic heart failure (HCC) Echocardiogram with reduced LV systolic function with EF 35 to 40%, RV systolic function  preserved. Moderate mitral valve regurgitation.  LV with akinetic entire apex, hypokinetic anterior wall, antero lateral wall, anterior septum, inferior wall, posterior wall, mid infero septal segment and basal inferoseptal segment.   Volume status has improved with ultrafiltration.   Plan to continue renal replacement therapy.  Ok to discontinue telemetry   Essential hypertension Continue blood pressure monitoring.  Ultrafiltration on HD   Type 2 diabetes mellitus (HCC) Continue glucose cover and monitoring with insulin sliding scale.  Patient is tolerating po well.   Osteogenesis imperfecta Continue PT and OT Noted old compression fractures.  Pain control with tramadol.   Class 3 obesity Calculated BMI is 43.3    Subjective: Patient seating at the side of the bed with no dyspnea or chest pain, tolerating po well.   Physical Exam: Vitals:   06/12/23 1130 06/12/23 1200 06/12/23 1300 06/12/23 1400  BP:  (!) 132/90    Pulse:  78 88 89  Resp:  (!) 22 15 20   Temp: 98.4 F (36.9 C)     TempSrc: Oral     SpO2:  98% 93% 93%  Weight:      Height:       Neurology awake and alert ENT with mild pallor Cardiovascular with S1 and S2 present and regular Respiratory with no rales or wheezing Abdomen with no distention  No lower extremity edema  Data Reviewed:    Family Communication: no family at the bedside   Disposition: Status is: Inpatient Remains inpatient appropriate because: pending outpatgient CLIP  Planned Discharge Destination: Home  Author: Coralie Keens, MD 06/12/2023 2:45 PM  For on call review [email or web address].

## 2023-06-12 NOTE — Plan of Care (Signed)

## 2023-06-12 NOTE — Plan of Care (Signed)
  Problem: Coping: Goal: Ability to adjust to condition or change in health will improve Outcome: Progressing   Problem: Fluid Volume: Goal: Ability to maintain a balanced intake and output will improve Outcome: Progressing   Problem: Health Behavior/Discharge Planning: Goal: Ability to identify and utilize available resources and services will improve Outcome: Progressing Goal: Ability to manage health-related needs will improve Outcome: Progressing   Problem: Metabolic: Goal: Ability to maintain appropriate glucose levels will improve Outcome: Progressing   Problem: Nutritional: Goal: Maintenance of adequate nutrition will improve Outcome: Progressing

## 2023-06-13 DIAGNOSIS — I5023 Acute on chronic systolic (congestive) heart failure: Secondary | ICD-10-CM | POA: Diagnosis not present

## 2023-06-13 DIAGNOSIS — I10 Essential (primary) hypertension: Secondary | ICD-10-CM | POA: Diagnosis not present

## 2023-06-13 DIAGNOSIS — Q780 Osteogenesis imperfecta: Secondary | ICD-10-CM | POA: Diagnosis not present

## 2023-06-13 DIAGNOSIS — E1121 Type 2 diabetes mellitus with diabetic nephropathy: Secondary | ICD-10-CM

## 2023-06-13 DIAGNOSIS — N179 Acute kidney failure, unspecified: Secondary | ICD-10-CM | POA: Diagnosis not present

## 2023-06-13 LAB — GLUCOSE, CAPILLARY
Glucose-Capillary: 68 mg/dL — ABNORMAL LOW (ref 70–99)
Glucose-Capillary: 78 mg/dL (ref 70–99)
Glucose-Capillary: 96 mg/dL (ref 70–99)
Glucose-Capillary: 73 mg/dL (ref 70–99)
Glucose-Capillary: 95 mg/dL (ref 70–99)
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
## 2023-06-13 NOTE — Progress Notes (Signed)
 Progress Note   Patient: Jerry Edwards ZOX:[id number] DOB: 1970/08/29 DOA: 06/06/2023     7 DOS: the patient was seen and examined on 06/13/2023   Brief hospital course: Mr. Soja was admitted to the hospital with the working diagnosis of worsening renal failure.   53 yo male with the past medical history of heart failure, osteogenesis imperfecta, CKD stage IV and OSA who presented with dyspnea and edema. Patient with progressive worsening renal function as outpatient. In the ED he had labored breathing, his blood pressure was 120/70, HR 86, RR 14 and 02 saturation 96% on supplemental 02 per [place].  Lungs with decreased breath sounds bilaterally with no wheezing or rhonchi, heart with S1 and S2 present and regular with no gallops or rubs, abdomen with no distention, and positive lower extremity edema.   Na 142, K >7,5 Cl 121, bicarbonate 12, glucose 85 bun 101 cr 13.0  High sensitive troponin 57 and 57  Wbc 6,0 hgb 8,5 plt 166  Sars covid 19 negative  Influenza negative   Chest radiograph with hypoinflation with cardiomegaly, bilateral hilar vascular congestion with bilateral interstitial infiltrates more predominantly at lower lobes.   EKG 77 bpm, left axis deviation, left bundle branch block, left anterior fascicular block, qtc 471, sinus rhythm with no significant ST segment or  T wave changes.   Patient was placed on CRRT for severe acidosis and hyperkalemia.  03/18 tunneled HD catheter.  03/19 transitioned to iHD.  03/20 HD scheduled MWF, pending outpatient CLIP.  03/21 HD today.  03/23 pending outpatient HD arrangements.   Assessment and Plan: * Acute kidney injury superimposed on chronic kidney disease (HCC) Patient has progressed to ESRD. Hyponatremia.   Clinically euvolemic.   Anemia of chronic renal disease. Continue with EPO.   Metabolic bone disease continue with sevelamer.   Acute on chronic systolic heart failure (HCC) Echocardiogram with reduced LV systolic function  with EF 35 to 40%, RV systolic function preserved. Moderate mitral valve regurgitation.  LV with akinetic entire apex, hypokinetic anterior wall, antero lateral wall, anterior septum, inferior wall, posterior wall, mid infero septal segment and basal inferoseptal segment.   Volume status has improved with ultrafiltration.  Continue with renal replacement therapy per nephrology recommendations.   Essential hypertension Continue blood pressure monitoring.  Ultrafiltration on HD   Type 2 diabetes mellitus (HCC) Continue glucose cover and monitoring with insulin sliding scale.  Patient is tolerating po well.   Osteogenesis imperfecta Continue PT and OT Noted old compression fractures.  Pain control with tramadol.   Class 3 obesity Calculated BMI is 43.3         Subjective: Patient with no chest pain or dyspnea, no nausea or vomiting, today seating in the chair at the side of the bed.   Physical Exam: Vitals:   06/13/23 1012 06/13/23 1100 06/13/23 1115 06/13/23 1200  BP: (!) 149/100     Pulse: 78     Resp: (!) 21 (!) 23 (!) 26 (!) 27  Temp:   98.3 F (36.8 C)   TempSrc:   Oral   SpO2: 97%     Weight:      Height:       Neurology awake and alert ENT with mild pallor Cardiovascular with S1 and S2 present and regular with no gallops, rubs or murmurs Respiratory with no rales or wheezing, no rhonchi Abdomen with no distention  No lower extremity edema  Data Reviewed:    Family Communication: no family at the bedside  Disposition: Status is: Inpatient Remains inpatient appropriate because: pending outpatient HD   Planned Discharge Destination: Home     Author: Coralie Keens, MD 06/13/2023 2:45 PM  For on call review [email or web address].

## 2023-06-13 NOTE — Progress Notes (Signed)
 Hypoglycemic Event  CBG: 68  Treatment: meal tray arrived, patient eating  Symptoms: None  Possible Reasons for Event: Unknown  Comments/MD notified: pt trend just below 70 each morning the last few days; not symptomatic; eats well   Jerry Edwards, Augustina Mood

## 2023-06-14 DIAGNOSIS — N179 Acute kidney failure, unspecified: Secondary | ICD-10-CM | POA: Diagnosis not present

## 2023-06-14 DIAGNOSIS — N186 End stage renal disease: Secondary | ICD-10-CM

## 2023-06-14 DIAGNOSIS — E1121 Type 2 diabetes mellitus with diabetic nephropathy: Secondary | ICD-10-CM | POA: Diagnosis not present

## 2023-06-14 DIAGNOSIS — I5023 Acute on chronic systolic (congestive) heart failure: Secondary | ICD-10-CM | POA: Diagnosis not present

## 2023-06-14 DIAGNOSIS — Z992 Dependence on renal dialysis: Secondary | ICD-10-CM

## 2023-06-14 DIAGNOSIS — I10 Essential (primary) hypertension: Secondary | ICD-10-CM | POA: Diagnosis not present

## 2023-06-14 LAB — GLUCOSE, CAPILLARY
Glucose-Capillary: 65 mg/dL — ABNORMAL LOW (ref 70–99)
Glucose-Capillary: 110 mg/dL — ABNORMAL HIGH (ref 70–99)
Glucose-Capillary: 84 mg/dL (ref 70–99)
Glucose-Capillary: 116 mg/dL — ABNORMAL HIGH (ref 70–99)
Glucose-Capillary: 89 mg/dL (ref 70–99)

## 2023-06-14 LAB — BASIC METABOLIC PANEL
Anion gap: 10 (ref 5–15)
BUN: 64 mg/dL — ABNORMAL HIGH (ref 6–20)
CO2: 27 mmol/L (ref 22–32)
Calcium: 8.1 mg/dL — ABNORMAL LOW (ref 8.9–10.3)
Chloride: 100 mmol/L (ref 98–111)
Creatinine, Ser: 7.9 mg/dL — ABNORMAL HIGH (ref 0.61–1.24)
GFR, Estimated: 8 mL/min — ABNORMAL LOW (ref >60)
Glucose, Bld: 83 mg/dL (ref 70–99)
Potassium: 5 mmol/L (ref 3.5–5.1)
Sodium: 137 mmol/L (ref 135–145)

## 2023-06-14 LAB — PHOSPHORUS: Phosphorus: 5.7 mg/dL — ABNORMAL HIGH (ref 2.5–4.6)

## 2023-06-14 MED ORDER — ANTICOAGULANT SODIUM CITRATE 4% (200MG/5ML) IV SOLN
5.0000 mL | Status: DC | PRN
  Administered 2023-06-16: 3.2 mL
  Administered 2023-06-18: 5 mL
  Filled 2023-06-14 (×5): qty 5
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
## 2023-06-14 NOTE — Progress Notes (Signed)
 PT Cancellation Note  Patient Details Name: Jerry Edwards MRN: [id number] DOB: 06-Oct-1970   Cancelled Treatment:    Reason Eval/Treat Not Completed: Patient at procedure or test/unavailable (Transport present in room to take pt to HD. PT to reattempt to see pt for treatment at a later time as schedule permits.)  Cheri Guppy, PT, DPT Acute Rehabilitation Services Office: [phone] Secure Chat Preferred  [name] 06/14/2023, 8:38 AM

## 2023-06-14 NOTE — Progress Notes (Signed)
 At 0616, pt's CBG was 65. Pt was given a cup of orange juice with 2 packs of sugar added. Will re-check CBG.

## 2023-06-14 NOTE — Progress Notes (Signed)
 Physical Therapy Treatment Patient Details Name: Jerry Edwards MRN: [id number] DOB: [birth year] Today's Date: 06/14/2023   History of Present Illness 53 y.o. male presents to [facility] 06/06/23 with SOB, swelling of hands and feet, and decreased urine output.Pt with acute renal failure, small B effusions, multiple compression deformities of lower thoracic and lumbar spine (no acute fx), multiple remote B rib fxs, and cardiomegaly with pulm vascular congestion. 3/16-3/18 CRRT. PMHx: osteogenesis imperfecta, HFrEF, CKD stage 4, pulmonary HTN, obesity    PT Comments  Pt greeted supine in bed, agreeable to a short PT session following encouragement. He c/o fatigue secondary to dialysis treatment. Pt completed functional mobility with supervision this session. He performed a squat pivot transfer from bed<>chair with the recliner positioned diagonally to the bed and use of BUE support. Will continue to follow acutely and advance appropriately.      If plan is discharge home, recommend the following: A little help with walking and/or transfers;A little help with bathing/dressing/bathroom;Assist for transportation;Help with stairs or ramp for entrance;Assistance with cooking/housework   Can travel by Doctor, hospital (measurements PT);Wheelchair cushion (measurements PT)    Recommendations for Other Services       Precautions / Restrictions Precautions Precautions: Fall Recall of Precautions/Restrictions: Intact Precaution/Restrictions Comments: multiple chronic compression deformities in thoracic/lumbar spine, B rib fxs Restrictions Weight Bearing Restrictions Per Provider Order: No     Mobility  Bed Mobility Overal bed mobility: Needs Assistance Bed Mobility: Supine to Sit     Supine to sit: HOB elevated, Used rails, Supervision     General bed mobility comments: Pt sat up on R side of bed without physical assistance and was able to scoot fwd til  feet flat.    Transfers Overall transfer level: Needs assistance Equipment used: None Transfers: Bed to chair/wheelchair/BSC       Squat pivot transfers: Supervision     General transfer comment: Pt transferred to R from bed>recliner chair positioned at a diagonal. He used BUE support from bed to chair armrests to make the transitioned. Pt utilize the same technqiue to transfer to L from recliner chair>bed. He completed the task smoothly, slow and steady.    Ambulation/Gait                   Stairs             Wheelchair Mobility     Tilt Bed    Modified Rankin (Stroke Patients Only)       Balance Overall balance assessment: Mild deficits observed, not formally tested                                          Communication Communication Communication: No apparent difficulties  Cognition Arousal: Alert Behavior During Therapy: WFL for tasks assessed/performed   PT - Cognitive impairments: No apparent impairments                         Following commands: Intact      Cueing Cueing Techniques: Verbal cues  Exercises      General Comments General comments (skin integrity, edema, etc.): VSS on 2L.      Pertinent Vitals/Pain Pain Assessment Pain Assessment: No/denies pain    Home Living  Prior Function            PT Goals (current goals can now be found in the care plan section) Acute Rehab PT Goals Patient Stated Goal: Return Home Progress towards PT goals: Progressing toward goals    Frequency    Min 2X/week      PT Plan      Co-evaluation              AM-PAC PT "6 Clicks" Mobility   Outcome Measure  Help needed turning from your back to your side while in a flat bed without using bedrails?: A Little Help needed moving from lying on your back to sitting on the side of a flat bed without using bedrails?: A Little Help needed moving to and from a bed to a  chair (including a wheelchair)?: A Little Help needed standing up from a chair using your arms (e.g., wheelchair or bedside chair)?: A Lot Help needed to walk in hospital room?: Total Help needed climbing 3-5 steps with a railing? : Total 6 Click Score: 13    End of Session Equipment Utilized During Treatment: Oxygen Activity Tolerance: Patient tolerated treatment well;Patient limited by fatigue Patient left: in bed;with call bell/phone within reach (Pt seated EOB with meal tray set up in front of him.) Nurse Communication: Mobility status;Other (comment) (Pt positioning) PT Visit Diagnosis: Other abnormalities of gait and mobility (R26.89);Muscle weakness (generalized) (M62.81);Difficulty in walking, not elsewhere classified (R26.2)     Time: 8295-6213 PT Time Calculation (min) (ACUTE ONLY): 10 min  Charges:    $Therapeutic Activity: 8-22 mins PT General Charges $$ ACUTE PT VISIT: 1 Visit                     Cheri Guppy, PT, DPT Acute Rehabilitation Services Office: [phone] Secure Chat Preferred  [name] 06/14/2023, 2:48 PM

## 2023-06-14 NOTE — Progress Notes (Signed)
 [facility] Progress Note  Name: Yusif Gnau MRN: [id number] DOB: 11-20-1970   Subjective:  He had 525 mL uop over 3/23.  Seen and examined on dialysis.  Procedure supervised.  Blood pressure 143/99 and HR 80.  Tolerating goal.  Catheter in use.  He is ok with me calling [facility] about a fistula.  He has questions about what this will mean for his use of a wheelchair and in transfers.  He states he is planning to go back home.    Review of systems:  Denies shortness of breath or chest pain  Denies n/v  ------------------- Background on consult:  Benton Tooker is an 53 y.o. male with osteogenesis imperfecta- wheelchair bound, OSA, previous systolic dysfunction ( in 2023 EF 30-35%) as well as progressive ckd since 2021- most recently crt over 4. He sees Dr. Gala Romney but last seen in October of 2024- never seen a kidney MD. Now p/w SOB- had had his lasix dose decreased several months ago probably in response to elevated crt. He was noted to be on Jardiance as an OP as well as lasix and potassium. Labs returned a crt of 13 but also potassium of over 7.5. Pt felt poorly for weeks w/ later dec UOP      Intake/Output Summary (Last 24 hours) at 06/14/2023 4098 Last data filed at 06/14/2023 1191 Gross per 24 hour  Intake 600 ml  Output 525 ml  Net 75 ml    Vitals:  Vitals:   06/14/23 0400 06/14/23 0735 06/14/23 0850 06/14/23 0908  BP: (!) 141/79 (!) 140/81 (!) 145/90 (!) 143/89  Pulse: 78 86 83 86  Resp: 19 18 17  (!) 23  Temp: 98.7 F (37.1 C) 97.8 F (36.6 C) 98.2 F (36.8 C)   TempSrc: Oral Oral    SpO2: 99% 96% 100% 100%  Weight: 100.1 kg  98.3 kg   Height:         Physical Exam:  General adult male in bed in no acute distress; stature consistent with OI HEENT normocephalic atraumatic extraocular movements intact sclera anicteric Neck supple trachea midline Lungs clear to auscultation bilaterally normal work of breathing at rest  Heart S1S2 no rub Abdomen  distended soft nontender  Extremities trace edema  Psych normal mood and affect Neuro - alert and oriented x 3 provides hx and follows commands Access: Tunneled catheter in use  Medications reviewed   Labs:     Latest Ref Rng & Units 06/14/2023    2:20 AM 06/12/2023    4:45 AM 06/11/2023    3:06 AM  BMP  Glucose 70 - 99 mg/dL 83  77  74   BUN 6 - 20 mg/dL 64  24  40   Creatinine 0.61 - 1.24 mg/dL 4.78  2.95  6.21   Sodium 135 - 145 mmol/L 137  133  136   Potassium 3.5 - 5.1 mmol/L 5.0  4.2  4.5   Chloride 98 - 111 mmol/L 100  98  101   CO2 22 - 32 mmol/L 27  27  26    Calcium 8.9 - 10.3 mg/dL 8.1  8.0  7.9      Assessment/Plan:   1. New ESRD-  progressive CKD as outpatient not previously followed by nephrology per charting.  Most recent GFR as OP in November was 14.   Presented with volume overload, hyperkalemia, and acidosis.  CRRT started on 3/16 mostly to correct hyperkalemia.  Note VT evening of 3/16; CRRT clotted off 3/18 AM -  Appreciate RIJ TC by VIR on 3/18 - HD today per MWF schedule CLIP requested - Obtain vein mapping  - Consult [facility]VS for AVF - Spoke with HD SW this morning and she will touch base later today    2. Hypertension/volume  - fluid overload improved with HD   3. Hyperkalemia - improved with RRT   4. Anemia of CKD -  on aranesp 150 mcg every Sunday.  Iron sats fine.  CBC in AM   5.  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  Still needs an outpatient HD unit  Estanislado Emms, MD 06/14/2023 9:51 AM

## 2023-06-14 NOTE — Plan of Care (Signed)

## 2023-06-14 NOTE — Progress Notes (Signed)
 Pt has been accepted at [facility] on MWF 10:15 am chair time. Pt can start on Wednesday if stable for d/c by then. Pt will need to arrive at 9:30 am for paperwork prior to treatment. Met with pt and pt's mother at bedside this afternoon to discuss above arrangements. Both agreeable to plans and schedule letter provided. Update provided to attending, nephrologist, RN CM, and pt's RN. Will assist as needed.   Olivia Canter Renal Navigator [phone]

## 2023-06-14 NOTE — Progress Notes (Addendum)
 Progress Note   Patient: Jerry Edwards ZOX:[id number] DOB: Apr 25, 1970 DOA: 06/06/2023     8 DOS: the patient was seen and examined on 06/14/2023   Brief hospital course: Mr. Bink was admitted to the hospital with the working diagnosis of worsening renal failure.   53 yo male with the past medical history of heart failure, osteogenesis imperfecta, CKD stage IV and OSA who presented with dyspnea and edema. Patient with progressive worsening renal function as outpatient. In the ED he had labored breathing, his blood pressure was 120/70, HR 86, RR 14 and 02 saturation 96% on supplemental 02 per [place].  Lungs with decreased breath sounds bilaterally with no wheezing or rhonchi, heart with S1 and S2 present and regular with no gallops or rubs, abdomen with no distention, and positive lower extremity edema.   Na 142, K >7,5 Cl 121, bicarbonate 12, glucose 85 bun 101 cr 13.0  High sensitive troponin 57 and 57  Wbc 6,0 hgb 8,5 plt 166  Sars covid 19 negative  Influenza negative   Chest radiograph with hypoinflation with cardiomegaly, bilateral hilar vascular congestion with bilateral interstitial infiltrates more predominantly at lower lobes.   EKG 77 bpm, left axis deviation, left bundle branch block, left anterior fascicular block, qtc 471, sinus rhythm with no significant ST segment or  T wave changes.   Patient was placed on CRRT for severe acidosis and hyperkalemia.  03/18 tunneled HD catheter.  03/19 transitioned to iHD.  03/20 HD scheduled MWF, pending outpatient CLIP.  03/21 HD today.  03/23 pending outpatient HD arrangements.  03/24 HD today, pending CLIP.   Assessment and Plan: * Acute kidney injury superimposed on chronic kidney disease (HCC) Patient has progressed to ESRD. Hyponatremia.   Clinically euvolemic.  Pre HD BUN 64, K 5,0 and serum bicarbonate at 27  Na 137 P 5.7   Anemia of chronic renal disease. Continue with EPO.   Metabolic bone disease continue with sevelamer.    Acute on chronic systolic heart failure (HCC) Echocardiogram with reduced LV systolic function with EF 35 to 40%, RV systolic function preserved. Moderate mitral valve regurgitation.  LV with akinetic entire apex, hypokinetic anterior wall, antero lateral wall, anterior septum, inferior wall, posterior wall, mid infero septal segment and basal inferoseptal segment.   Volume status has improved with ultrafiltration.  Continue with renal replacement therapy per nephrology recommendations.   Essential hypertension Continue blood pressure monitoring.  Ultrafiltration on HD   Type 2 diabetes mellitus (HCC) Glucose has been stable will stop sliding scale and continue capillary glucose monitoring as needed.  Patient is tolerating po well.   Osteogenesis imperfecta Continue PT and OT Noted old compression fractures.  Pain control with tramadol.   Class 3 obesity Calculated BMI is 43.3     Subjective: Patient with no chest pain or dyspnea, he tolerated well HD.   Physical Exam: Vitals:   06/13/23 1525 06/13/23 1934 06/14/23 0056 06/14/23 0400  BP: 136/81 (!) 158/80 (!) 152/92 (!) 141/79  Pulse: 86 87 86 78  Resp: 19 19 19 19   Temp: 98.3 F (36.8 C) 98.9 F (37.2 C) 98 F (36.7 C) 98.7 F (37.1 C)  TempSrc: Oral Oral Oral Oral  SpO2: 97% 96% 95% 99%  Weight: 98 kg   100.1 kg  Height: 4\' 11"  (1.499 m)      Neurology awake and alert ENT with mild pallor Cardiovascular with S1 and S2 present and regular with no gallops, rubs, positive systolic  murmur at the apex.  Respiratory with no rales or wheezing Abdomen with no distention  No lower extremity edema  Data Reviewed:    Family Communication: no family at the bedside   Disposition: Status is: Inpatient Remains inpatient appropriate because: pending CLIP  Planned Discharge Destination: Home    Author: Coralie Keens, MD 06/14/2023 7:45 AM  For on call review [email or web address].

## 2023-06-14 NOTE — Progress Notes (Signed)
 Received patient in bed to unit.  Alert and oriented.  Informed consent signed and in chart.   TX duration:3 hours  Patient tolerated well.  Transported back to the room  Alert, without acute distress.  Hand-off given to patient's nurse.   Access used: R internal jugular HD Cath Access issues: none  Total UF removed: 2L Medication(s) given: Sodium citrate lumen dwells due to no heparin for patient   06/14/23 1218  Vitals  Temp 97.8 F (36.6 C)  BP (!) 130/93  Pulse Rate 83  Resp 20  Oxygen Therapy  SpO2 99 %  O2 Device Nasal Cannula;Room Air  O2 Flow Rate (L/min) 2 L/min  Patient Activity (if Appropriate) In bed  Pulse Oximetry Type Continuous  During Treatment Monitoring  Duration of HD Treatment -hour(s) 3 hour(s)  HD Safety Checks Performed Yes  Intra-Hemodialysis Comments Tx completed;Tolerated well  Dialysis Fluid Bolus Normal Saline  Bolus Amount (mL) 300 mL  Post Treatment  Dialyzer Clearance Lightly streaked  Liters Processed 63  Fluid Removed (mL) 2000 mL  Tolerated HD Treatment Yes  Hemodialysis Catheter Right Internal jugular Double lumen Permanent (Tunneled)  Placement Date/Time: 06/08/23 1537   Serial / Lot #: [id number]  Expiration Date: 12/21/27  Time Out: Correct patient;Correct site;Correct procedure  Maximum sterile barrier precautions: Hand hygiene;Cap;Mask;Sterile gown;Sterile gloves;Large sterile s...  Site Condition No complications  Blue Lumen Status Flushed;Heparin locked;Dead end cap in place  Red Lumen Status Flushed;Heparin locked;Dead end cap in place  Purple Lumen Status N/A  Catheter fill solution 4% Sodium Citrate  Catheter fill volume (Arterial) 1.6 cc  Catheter fill volume (Venous) 1.6  Dressing Type Transparent  Dressing Status Antimicrobial disc/dressing in place;Clean, Dry, Intact (deaccessed)  Drainage Description None  Dressing Change Due 06/21/23  Post treatment catheter status Capped and Clamped     Stacie Glaze  LPN Kid[facility] Dialysis [facility]

## 2023-06-14 NOTE — Progress Notes (Signed)
 At 0655, pt's CBG was re-checked and found to be 110 after consuming orange juice with sugar added. No sliding scale coverage needed. HD has called for report, and will be transporting pt for treatment shortly. Day shift nurse advised.

## 2023-06-14 NOTE — TOC Progression Note (Signed)
 Transition of Care [facility]) - Progression Note    Patient Details  Name: Jerry Edwards MRN: [id number] Date of Birth: 03-03-1971  Transition of Care [facility]) CM/SW Contact  Elliot Cousin, RN Phone Number: [phone] 06/14/2023, 5:30 PM  Clinical Narrative:     TOC CM ordered wheelchair with [employer]. Scheduled dc home tomorrow with parents.   Expected Discharge Plan: Home/Self Care Barriers to Discharge: Continued Medical Work up  Expected Discharge Plan and Services   Discharge Planning Services: CM Consult   Living arrangements for the past 2 months: Single Family Home                                       Social Determinants of Health (SDOH) Interventions SDOH Screenings   Food Insecurity: No Food Insecurity (06/06/2023)  Housing: Low Risk  (06/06/2023)  Transportation Needs: No Transportation Needs (06/06/2023)  Utilities: Not At Risk (06/06/2023)  Depression (PHQ2-9): Low Risk  (05/24/2019)  Physical Activity: Insufficiently Active (03/27/2022)  Social Connections: Patient Declined (06/06/2023)  Tobacco Use: Medium Risk (06/06/2023)    Readmission Risk Interventions     No data to display

## 2023-06-15 ENCOUNTER — Inpatient Hospital Stay (HOSPITAL_COMMUNITY)

## 2023-06-15 DIAGNOSIS — N179 Acute kidney failure, unspecified: Secondary | ICD-10-CM | POA: Diagnosis not present

## 2023-06-15 DIAGNOSIS — I10 Essential (primary) hypertension: Secondary | ICD-10-CM | POA: Diagnosis not present

## 2023-06-15 DIAGNOSIS — N186 End stage renal disease: Secondary | ICD-10-CM | POA: Diagnosis not present

## 2023-06-15 DIAGNOSIS — E1121 Type 2 diabetes mellitus with diabetic nephropathy: Secondary | ICD-10-CM | POA: Diagnosis not present

## 2023-06-15 DIAGNOSIS — I5023 Acute on chronic systolic (congestive) heart failure: Secondary | ICD-10-CM | POA: Diagnosis not present

## 2023-06-15 LAB — GLUCOSE, CAPILLARY
Glucose-Capillary: 60 mg/dL — ABNORMAL LOW (ref 70–99)
Glucose-Capillary: 90 mg/dL (ref 70–99)
Glucose-Capillary: 88 mg/dL (ref 70–99)

## 2023-06-15 LAB — RENAL FUNCTION PANEL
Albumin: 1.8 g/dL — ABNORMAL LOW (ref 3.5–5.0)
Anion gap: 7 (ref 5–15)
BUN: 41 mg/dL — ABNORMAL HIGH (ref 6–20)
CO2: 27 mmol/L (ref 22–32)
Calcium: 8 mg/dL — ABNORMAL LOW (ref 8.9–10.3)
Chloride: 100 mmol/L (ref 98–111)
Creatinine, Ser: 5.52 mg/dL — ABNORMAL HIGH (ref 0.61–1.24)
GFR, Estimated: 12 mL/min — ABNORMAL LOW (ref >60)
Glucose, Bld: 72 mg/dL (ref 70–99)
Phosphorus: 5.5 mg/dL — ABNORMAL HIGH (ref 2.5–4.6)
Potassium: 4.7 mmol/L (ref 3.5–5.1)
Sodium: 134 mmol/L — ABNORMAL LOW (ref 135–145)

## 2023-06-15 LAB — CBC
HCT: 27.7 % — ABNORMAL LOW (ref 39.0–52.0)
Hemoglobin: 8.3 g/dL — ABNORMAL LOW (ref 13.0–17.0)
MCH: 28.9 pg (ref 26.0–34.0)
MCHC: 30 g/dL (ref 30.0–36.0)
MCV: 96.5 fL (ref 80.0–100.0)
Platelets: 180 10*3/uL (ref 150–400)
RBC: 2.87 MIL/uL — ABNORMAL LOW (ref 4.22–5.81)
RDW: 14.3 % (ref 11.5–15.5)
WBC: 6.9 10*3/uL (ref 4.0–10.5)
nRBC: 0 % (ref 0.0–0.2)

## 2023-06-15 MED ORDER — FUROSEMIDE 10 MG/ML IJ SOLN
80.0000 mg | Freq: Once | INTRAMUSCULAR | Status: AC
  Administered 2023-06-15: 80 mg via INTRAVENOUS
  Filled 2023-06-15: qty 8

## 2023-06-15 MED ORDER — CHLORHEXIDINE GLUCONATE CLOTH 2 % EX PADS: 6.0000 | MEDICATED_PAD | Freq: Every day | CUTANEOUS | Status: DC

## 2023-06-15 NOTE — Progress Notes (Signed)
 Hypoglycemic Event  CBG: 60  Treatment: 4 oz juice/soda  Symptoms: None  Follow-up CBG: Time: 5:56 CBG Result: 90   Legrand Pitts

## 2023-06-15 NOTE — Progress Notes (Signed)
  Progress Note    06/15/2023 7:06 AM * No surgery found *  Subjective:  sitting up eating breakfast. No complaints   Vitals:   06/15/23 0040 06/15/23 0417  BP: (!) 136/90 123/86  Pulse: 79 72  Resp: 18 16  Temp: 97.8 F (36.6 C) 97.7 F (36.5 C)  SpO2: 90% 100%   Physical Exam: Cardiac:  regular Lungs:  non labored Extremities:  BUE well perfused and warm. 2+ radial pulses Neurologic: alert and oriented   CBC    Component Value Date/Time   WBC 6.9 06/15/2023 0312   RBC 2.87 (L) 06/15/2023 0312   HGB 8.3 (L) 06/15/2023 0312   HCT 27.7 (L) 06/15/2023 0312   PLT 180 06/15/2023 0312   MCV 96.5 06/15/2023 0312   MCH 28.9 06/15/2023 0312   MCHC 30.0 06/15/2023 0312   RDW 14.3 06/15/2023 0312   LYMPHSABS 1.4 02/02/2022 1857   MONOABS 1.3 (H) 02/02/2022 1857   EOSABS 0.1 02/02/2022 1857   BASOSABS 0.0 02/02/2022 1857    BMET    Component Value Date/Time   NA 134 (L) 06/15/2023 0312   K 4.7 06/15/2023 0312   CL 100 06/15/2023 0312   CO2 27 06/15/2023 0312   GLUCOSE 72 06/15/2023 0312   BUN 41 (H) 06/15/2023 0312   CREATININE 5.52 (H) 06/15/2023 0312   CALCIUM 8.0 (L) 06/15/2023 0312   GFRNONAA 12 (L) 06/15/2023 0312   GFRAA >60 06/28/2019 1523    INR    Component Value Date/Time   INR 1.0 02/08/2021 1754     Intake/Output Summary (Last 24 hours) at 06/15/2023 0706 Last data filed at 06/15/2023 0419 Gross per 24 hour  Intake 480 ml  Output 4450 ml  Net -3970 ml     Assessment/Plan:  53 y.o. male with ESRD needing permanent dialysis access  Vein mapping is still pending, scheduled for this afternoon  Plan is for upper extremity AV fistula vs AV graft Reviewed procedure with patient and answered his questions Pt is right hand dominant so likely a LUE access  He will need to be NPO after midnight. Order placed Consent order placed  [facility] [phone] 06/15/2023 7:06 AM

## 2023-06-15 NOTE — TOC Progression Note (Addendum)
 Transition of Care [facility]) - Progression Note    Patient Details  Name: Conor Lata MRN: [id number] Date of Birth: Jul 15, 1970  [facility]) CM/SW Contact  Leone Haven, RN Phone Number: 06/15/2023, 11:36 AM  Clinical Narrative:    NCM offered choice to patient, he says he does not have a preference, NCM made referral to [facility] with [facility], he is able to take referral.  Soc will begin 24 to 48 hrs post dc. Patient states the w/chair is in the room. He is HD patient on MWF.  He is set up with HHRN, HHPT, HHOT. Will need orders.   Expected Discharge Plan: Home/Self Care Barriers to Discharge: Continued Medical Work up  Expected Discharge Plan and Services   Discharge Planning Services: CM Consult   Living arrangements for the past 2 months: Single Family Home                                       Social Determinants of Health (SDOH) Interventions SDOH Screenings   Food Insecurity: No Food Insecurity (06/06/2023)  Housing: Low Risk  (06/06/2023)  Transportation Needs: No Transportation Needs (06/06/2023)  Utilities: Not At Risk (06/06/2023)  Depression (PHQ2-9): Low Risk  (05/24/2019)  Physical Activity: Insufficiently Active (03/27/2022)  Social Connections: Patient Declined (06/06/2023)  Tobacco Use: Medium Risk (06/06/2023)    Readmission Risk Interventions     No data to display

## 2023-06-15 NOTE — Progress Notes (Incomplete)
 PROGRESS NOTE    Jerry Edwards  YNW:[id number] DOB: [birth year] DOA: 06/06/2023 PCP: Dolores Patty, MD  53/M w osteogenesis imperfecta- wheelchair bound, OSA, previous systolic dysfunction ( in 2023 EF 30-35%), CKD 4, presented w SOB- lasix dose decreased several months ago probably in response to elevated crt. Labs returned a crt of 13 but also potassium of over 7.5, CXR w/ CHF Patient was placed on CRRT for severe acidosis and hyperkalemia.  3/18 tunneled HD catheter.  3/19 transitioned to iHD.  3/20 HD scheduled MWF, pending outpatient CLIP.   Subjective:  Assessment and Plan:  AKI on CKD4, now ESRD Hyponatremia.  -s/p tunneled HD cath and started HD Plan for fistula creation tomorrow.  HD scheduled MWF, He will start HD as outpatient on Friday.   Anemia of chronic renal disease. Continue with EPO.   Metabolic bone disease continue with sevelamer.   Acute on chronic systolic heart failure (HCC) -Echo with EF 35 to 40%, RV systolic function preserved. Moderate mitral valve regurgitation.  LV with akinetic entire apex, hypokinetic anterior wall, antero lateral wall, anterior septum, inferior wall, posterior wall, mid infero septal segment and basal inferoseptal segment.  -Volume status has improved with ultrafiltration.  -HD as above   Essential hypertension Continue blood pressure monitoring.  Ultrafiltration on HD   Type 2 diabetes mellitus (HCC) -off sliding scale   Patient is tolerating po well.   Osteogenesis imperfecta Continue PT and OT Noted old compression fractures.  Pain control with tramadol.   Class 3 obesity Calculated BMI is 43.3    DVT prophylaxis: Hep SQ Code Status: Full Code Family Communication: Disposition Plan:   Consultants:    Procedures:   Antimicrobials:    Objective: Vitals:   06/15/23 0417 06/15/23 0805 06/15/23 1120 06/15/23 1505  BP: 123/86 138/83 138/82 (!) 154/86  Pulse: 72 80 82 81  Resp: 16 18 18 20   Temp: 97.7  F (36.5 C) 97.8 F (36.6 C) 98 F (36.7 C) 98.2 F (36.8 C)  TempSrc: Axillary Oral Oral Oral  SpO2: 100% 96% 94% 99%  Weight: 100.8 kg     Height:        Intake/Output Summary (Last 24 hours) at 06/15/2023 1622 Last data filed at 06/15/2023 0419 Gross per 24 hour  Intake 360 ml  Output 150 ml  Net 210 ml   Filed Weights   06/14/23 0850 06/14/23 1229 06/15/23 0417  Weight: 98.3 kg 96.3 kg 100.8 kg    Examination:      Data Reviewed:   CBC: Recent Labs  Lab 06/09/23 0436 06/10/23 0353 06/11/23 0306 06/15/23 0312  WBC 6.4 7.4 7.4 6.9  HGB 8.0* 8.8* 8.2* 8.3*  HCT 26.7* 28.4* 26.7* 27.7*  MCV 95.0 93.1 94.0 96.5  PLT 126* 131* 147* 180   Basic Metabolic Panel: Recent Labs  Lab 06/09/23 0436 06/09/23 1611 06/10/23 0353 06/11/23 0306 06/12/23 0445 06/14/23 0220 06/15/23 0312  NA 134*   < > 133* 136 133* 137 134*  K 4.4   < > 4.2 4.5 4.2 5.0 4.7  CL 100   < > 101 101 98 100 100  CO2 25   < > 26 26 27 27 27   GLUCOSE 225*   < > 75 74 77 83 72  BUN 29*   < > 27* 40* 24* 64* 41*  CREATININE 5.01*   < > 4.71* 6.39* 5.02* 7.90* 5.52*  CALCIUM 7.2*   < > 7.4* 7.9* 8.0* 8.1* 8.0*  MG 2.2  --  1.9 1.9  --   --   --   PHOS 4.7*   < > 4.3 5.3* 3.8 5.7* 5.5*   < > = values in this interval not displayed.   GFR: Estimated Creatinine Clearance: 15.1 mL/min (A) (by C-G formula based on SCr of 5.52 mg/dL (H)). Liver Function Tests: Recent Labs  Lab 06/09/23 1611 06/10/23 0353 06/11/23 0306 06/12/23 0445 06/15/23 0312  ALBUMIN 1.7* 1.8* 1.7* 1.9* 1.8*   No results for input(s): "LIPASE", "AMYLASE" in the last 168 hours. No results for input(s): "AMMONIA" in the last 168 hours. Coagulation Profile: No results for input(s): "INR", "PROTIME" in the last 168 hours. Cardiac Enzymes: No results for input(s): "CKTOTAL", "CKMB", "CKMBINDEX", "TROPONINI" in the last 168 hours. BNP (last 3 results) No results for input(s): "PROBNP" in the last 8760  hours. HbA1C: No results for input(s): "HGBA1C" in the last 72 hours. CBG: Recent Labs  Lab 06/14/23 1514 06/14/23 2113 06/15/23 0523 06/15/23 0556 06/15/23 1119  GLUCAP 116* 89 60* 90 88   Lipid Profile: No results for input(s): "CHOL", "HDL", "LDLCALC", "TRIG", "CHOLHDL", "LDLDIRECT" in the last 72 hours. Thyroid Function Tests: No results for input(s): "TSH", "T4TOTAL", "FREET4", "T3FREE", "THYROIDAB" in the last 72 hours. Anemia Panel: No results for input(s): "VITAMINB12", "FOLATE", "FERRITIN", "TIBC", "IRON", "RETICCTPCT" in the last 72 hours. Urine analysis:    Component Value Date/Time   COLORURINE STRAW (A) 02/02/2022 1658   APPEARANCEUR CLEAR 02/02/2022 1658   LABSPEC 1.006 02/02/2022 1658   PHURINE 7.0 02/02/2022 1658   GLUCOSEU 150 (A) 02/02/2022 1658   HGBUR NEGATIVE 02/02/2022 1658   BILIRUBINUR NEGATIVE 02/02/2022 1658   KETONESUR NEGATIVE 02/02/2022 1658   PROTEINUR 100 (A) 02/02/2022 1658   UROBILINOGEN 1.0 05/26/2011 2259   NITRITE NEGATIVE 02/02/2022 1658   LEUKOCYTESUR NEGATIVE 02/02/2022 1658   Sepsis Labs: @LABRCNTIP (procalcitonin:4,lacticidven:4)  ) Recent Results (from the past 240 hours)  Resp panel by RT-PCR (RSV, Flu A&B, Covid) Anterior Nasal Swab     Status: None   Collection Time: 06/06/23  4:20 AM   Specimen: Anterior Nasal Swab  Result Value Ref Range Status   SARS Coronavirus 2 by RT PCR NEGATIVE NEGATIVE Final    Comment: (NOTE) SARS-CoV-2 target nucleic acids are NOT DETECTED.  The SARS-CoV-2 RNA is generally detectable in upper respiratory specimens during the acute phase of infection. The lowest concentration of SARS-CoV-2 viral copies this assay can detect is 138 copies/mL. A negative result does not preclude SARS-Cov-2 infection and should not be used as the sole basis for treatment or other patient management decisions. A negative result may occur with  improper specimen collection/handling, submission of specimen  other than nasopharyngeal swab, presence of viral mutation(s) within the areas targeted by this assay, and inadequate number of viral copies(<138 copies/mL). A negative result must be combined with clinical observations, patient history, and epidemiological information. The expected result is Negative.  Fact Sheet for Patients:  BloggerCourse.com  Fact Sheet for Healthcare Providers:  SeriousBroker.it  This test is no t yet approved or cleared by the [place] FDA and  has been authorized for detection and/or diagnosis of SARS-CoV-2 by FDA under an Emergency Use Authorization (EUA). This EUA will remain  in effect (meaning this test can be used) for the duration of the COVID-19 declaration under Section 564(b)(1) of the Act, 21 U.S.C.section 360bbb-3(b)(1), unless the authorization is terminated  or revoked sooner.       Influenza A by PCR NEGATIVE NEGATIVE Final   Influenza  B by PCR NEGATIVE NEGATIVE Final    Comment: (NOTE) The Xpert Xpress SARS-CoV-2/FLU/RSV plus assay is intended as an aid in the diagnosis of influenza from Nasopharyngeal swab specimens and should not be used as a sole basis for treatment. Nasal washings and aspirates are unacceptable for Xpert Xpress SARS-CoV-2/FLU/RSV testing.  Fact Sheet for Patients: BloggerCourse.com  Fact Sheet for Healthcare Providers: SeriousBroker.it  This test is not yet approved or cleared by the [place] FDA and has been authorized for detection and/or diagnosis of SARS-CoV-2 by FDA under an Emergency Use Authorization (EUA). This EUA will remain in effect (meaning this test can be used) for the duration of the COVID-19 declaration under Section 564(b)(1) of the Act, 21 U.S.C. section 360bbb-3(b)(1), unless the authorization is terminated or revoked.     Resp Syncytial Virus by PCR NEGATIVE NEGATIVE Final     Comment: (NOTE) Fact Sheet for Patients: BloggerCourse.com  Fact Sheet for Healthcare Providers: SeriousBroker.it  This test is not yet approved or cleared by the [place] FDA and has been authorized for detection and/or diagnosis of SARS-CoV-2 by FDA under an Emergency Use Authorization (EUA). This EUA will remain in effect (meaning this test can be used) for the duration of the COVID-19 declaration under Section 564(b)(1) of the Act, 21 U.S.C. section 360bbb-3(b)(1), unless the authorization is terminated or revoked.  Performed at [facility], [address], [place], [place] [address]   MRSA Next Gen by PCR, Nasal     Status: None   Collection Time: 06/06/23 10:58 AM   Specimen: Nasal Mucosa; Nasal Swab  Result Value Ref Range Status   MRSA by PCR Next Gen NOT DETECTED NOT DETECTED Final    Comment: (NOTE) The GeneXpert MRSA Assay (FDA approved for NASAL specimens only), is one component of a comprehensive MRSA colonization surveillance program. It is not intended to diagnose MRSA infection nor to guide or monitor treatment for MRSA infections. Test performance is not FDA approved in patients less than 73 years old. Performed at [facility], [address] [place], [place] [address]      Radiology Studies: VAS [place] UPPER EXT VEIN MAPPING (PRE-OP AVF) Result Date: 06/15/2023 UPPER EXTREMITY VEIN MAPPING Patient Name:  KELE BARTHELEMY  Date of Exam:   06/15/2023 Medical Rec #: [id number]      Accession #:    [id number] Date of Birth: 1970/03/27      Patient Gender: M Patient Age:   30 years Exam Location:  [facility] Procedure:      VAS [place] UPPER EXT VEIN MAPPING (PRE-OP AVF) Referring Phys: Vallery Sa --------------------------------------------------------------------------------  Indications: Pre-access. History: ESRD, CHF, swelling of hands and feet, HTN.  Comparison Study: No prior exam.  Performing Technologist: Fernande Bras  Examination Guidelines: A complete evaluation includes B-mode imaging, spectral Doppler, color Doppler, and power Doppler as needed of all accessible portions of each vessel. Bilateral testing is considered an integral part of a complete examination. Limited examinations for reoccurring indications may be performed as noted. +-----------------+-------------+----------+---------+ Right Cephalic   Diameter (cm)Depth (cm)Findings  +-----------------+-------------+----------+---------+ Shoulder             0.49        0.46             +-----------------+-------------+----------+---------+ Prox upper arm       0.35        0.40             +-----------------+-------------+----------+---------+ Mid upper  arm        0.45        0.41             +-----------------+-------------+----------+---------+ Dist upper arm       0.57        0.30   branching +-----------------+-------------+----------+---------+ Antecubital fossa    0.86        0.24   IV line.  +-----------------+-------------+----------+---------+ Prox forearm         0.54        0.70             +-----------------+-------------+----------+---------+ Mid forearm          0.47        0.50   branching +-----------------+-------------+----------+---------+ Dist forearm         0.38        0.20   branching +-----------------+-------------+----------+---------+ Wrist                0.36        0.22             +-----------------+-------------+----------+---------+ +--------------+-------------+----------+---------+ Right Basilic Diameter (cm)Depth (cm)Findings  +--------------+-------------+----------+---------+ Mid upper arm     0.37                         +--------------+-------------+----------+---------+ Dist upper arm    0.36               branching +--------------+-------------+----------+---------+ Prox forearm      0.38                          +--------------+-------------+----------+---------+ Mid forearm       0.35                         +--------------+-------------+----------+---------+ Distal forearm    0.24                         +--------------+-------------+----------+---------+ Elbow             0.28                         +--------------+-------------+----------+---------+ Wrist             0.24                         +--------------+-------------+----------+---------+ +-----------------+-------------+----------+---------+ Left Cephalic    Diameter (cm)Depth (cm)Findings  +-----------------+-------------+----------+---------+ Shoulder             0.42        0.49             +-----------------+-------------+----------+---------+ Prox upper arm       0.39        0.33   branching +-----------------+-------------+----------+---------+ Mid upper arm        0.39        0.40             +-----------------+-------------+----------+---------+ Dist upper arm       0.40        0.34             +-----------------+-------------+----------+---------+ Antecubital fossa    0.40        0.20   branching +-----------------+-------------+----------+---------+ Prox forearm         0.45        0.47             +-----------------+-------------+----------+---------+  Mid forearm          0.45        0.36             +-----------------+-------------+----------+---------+ Dist forearm         0.32        0.16   branching +-----------------+-------------+----------+---------+ Wrist                0.32        0.18             +-----------------+-------------+----------+---------+ +--------------+-------------+----------+---------+ Left Basilic  Diameter (cm)Depth (cm)Findings  +--------------+-------------+----------+---------+ Mid upper arm     0.33                         +--------------+-------------+----------+---------+ Dist upper arm    0.40                          +--------------+-------------+----------+---------+ Prox forearm      0.23                         +--------------+-------------+----------+---------+ Mid forearm       0.29                         +--------------+-------------+----------+---------+ Distal forearm    0.23               branching +--------------+-------------+----------+---------+ Elbow             0.33               branching +--------------+-------------+----------+---------+ Wrist             0.21                         +--------------+-------------+----------+---------+ *See table(s) above for measurements and observations.  Diagnosing physician: Carolynn Sayers Electronically signed by Carolynn Sayers on 06/15/2023 at 12:18:14 PM.    Final      Scheduled Meds:  (feeding supplement) PROSource Plus  30 mL Oral BID BM   Chlorhexidine Gluconate Cloth  6 each Topical Q0600   darbepoetin (ARANESP) injection - DIALYSIS  150 mcg Subcutaneous Q Sun-1800   heparin  5,000 Units Subcutaneous Q8H   multivitamin  1 tablet Oral QHS   sevelamer carbonate  800 mg Oral TID WC   sodium chloride flush  10-40 mL Intracatheter Q12H   Continuous Infusions:  anticoagulant sodium citrate       LOS: 9 days    Time spent:    Zannie [place], MD [facility]   06/15/2023, 4:22 PM

## 2023-06-15 NOTE — Progress Notes (Signed)
 New Dialysis Start    Patient identified as new dialysis start. Kidney Education packet assembled and given. Discussed the following items with patient:     Current medications and possible changes once started:  Discussed that patient's medications may change over time.  Ex; hypertension medications and diabetes medication.  Nephrologists will adjust as needed. Starting Renvela.   Fluid restrictions reviewed:  32 oz daily goal:  All liquids count; soups, ice, jello, fruits. Will also refer dietitian.   Phosphorus and potassium: Handout given showing high potassium and phosphorus foods.  Alternative food and drink options given. Will also refer dietitian.   Family support:  Mother is strong support system.   Outpatient Clinic Resources:  Discussed roles of Outpatient clinic staff and advised to make a list of needs, if any, to talk with outpatient staff if needed.   Care plan schedule: Informed patient of Care Plans in outpatient setting and to participate in the care plan.  An invitation would be given from outpatient clinic.    Dialysis Access Options:  Reviewed access options with patients. Discussed in detail about care at home with new AVG & AVF. Reviewed checking bruit and thrill. If dialysis catheter present, educated that patient could not take showers.  Catheter dressing changes were to be done by outpatient clinic staff only. AVF/G placement scheduled for 3/26.   Home therapy options:  Educated patient about home therapy options:  PD vs home hemo.  Patient interested but not at this moment, will start in outpatient first.   Patient verbalized understanding. Will continue to round on patient during admission.    Jean Rosenthal Dialysis Nurse Coordinator [phone]

## 2023-06-15 NOTE — Progress Notes (Signed)
 Physical Therapy Treatment Patient Details Name: Jerry Edwards MRN: [id number] DOB: 1971/02/23 Today's Date: 06/15/2023   History of Present Illness 53 y.o. male presents to [facility] 06/06/23 with SOB, swelling of hands and feet, and decreased urine output.Pt with acute renal failure, small B effusions, multiple compression deformities of lower thoracic and lumbar spine (no acute fx), multiple remote B rib fxs, and cardiomegaly with pulm vascular congestion. 3/16-3/18 CRRT. PMHx: osteogenesis imperfecta, HFrEF, CKD stage 4, pulmonary HTN, obesity    PT Comments  Pt greeted seated EOB, pleasant and agreeable to short PT session to obtain w/c measurements. He performed bed mobility with modI, increased time to achieve changes in position. Pt required minA to maintain BLE in a 90-90 position while supine in bed. He declined to transfer from EOB. Pt will need a 16" seat depth, 20" seat width, and 16" seat to floor height and would benefit from a cushion. Pt will continue to benefit from acute PT services to increase his independence and safety with mobility to allow d/c Home with HHPT.       If plan is discharge home, recommend the following: A little help with walking and/or transfers;A little help with bathing/dressing/bathroom;Assist for transportation;Help with stairs or ramp for entrance;Assistance with cooking/housework   Can travel by Doctor, hospital (measurements PT);Wheelchair cushion (measurements PT)    Recommendations for Other Services       Precautions / Restrictions Precautions Precautions: Fall Recall of Precautions/Restrictions: Intact Precaution/Restrictions Comments: multiple chronic compression deformities in thoracic/lumbar spine, B rib fxs Restrictions Weight Bearing Restrictions Per Provider Order: No     Mobility  Bed Mobility Overal bed mobility: Modified Independent Bed Mobility: Supine to Sit, Sit to Supine,  Rolling Rolling: Modified independent (Device/Increase time)   Supine to sit: Modified independent (Device/Increase time) Sit to supine: Modified independent (Device/Increase time)   General bed mobility comments: Pt greeted seated EOB with feet flat on floor. He completed supine<>sit twice and rolled R/L with increased time and intermittent use of bedrails.    Transfers                   General transfer comment: Pt declined to transfer OOB. He stated he preferred to remain seated EOB.    Ambulation/Gait                   Stairs             Wheelchair Mobility     Tilt Bed    Modified Rankin (Stroke Patients Only)       Balance Overall balance assessment: Mild deficits observed, not formally tested                                          Communication Communication Communication: No apparent difficulties  Cognition Arousal: Alert Behavior During Therapy: WFL for tasks assessed/performed   PT - Cognitive impairments: No apparent impairments                         Following commands: Intact      Cueing Cueing Techniques: Verbal cues  Exercises Other Exercises Other Exercises: Pt achieved 90-90 position in supine with B hip and B knee flex towards chest and minA to support BLE in the air.    General Comments General comments (  skin integrity, edema, etc.): VSS on 2L. Pt engaged in position changes for w/c measurement to be obtained including seat depth (16"), seat width (20"), and seat to floor height (16") which was passed along to case management.      Pertinent Vitals/Pain Pain Assessment Pain Assessment: No/denies pain    Home Living                          Prior Function            PT Goals (current goals can now be found in the care plan section) Acute Rehab PT Goals Patient Stated Goal: Return Home Progress towards PT goals: Progressing toward goals    Frequency    Min  2X/week      PT Plan      Co-evaluation              AM-PAC PT "6 Clicks" Mobility   Outcome Measure  Help needed turning from your back to your side while in a flat bed without using bedrails?: A Little Help needed moving from lying on your back to sitting on the side of a flat bed without using bedrails?: A Little Help needed moving to and from a bed to a chair (including a wheelchair)?: A Little Help needed standing up from a chair using your arms (e.g., wheelchair or bedside chair)?: A Lot Help needed to walk in hospital room?: Total Help needed climbing 3-5 steps with a railing? : Total 6 Click Score: 13    End of Session Equipment Utilized During Treatment: Oxygen Activity Tolerance: Patient tolerated treatment well Patient left: in bed;with call bell/phone within reach (seated EOB with tray table placed in front of him.) Nurse Communication: Mobility status;Other (comment) (Pt positioning) PT Visit Diagnosis: Other abnormalities of gait and mobility (R26.89);Muscle weakness (generalized) (M62.81);Difficulty in walking, not elsewhere classified (R26.2)     Time: 1610-9604 PT Time Calculation (min) (ACUTE ONLY): 9 min  Charges:    $Therapeutic Activity: 8-22 mins PT General Charges $$ ACUTE PT VISIT: 1 Visit                     Cheri Guppy, PT, DPT Acute Rehabilitation Services Office: [phone] Secure Chat Preferred  [name] 06/15/2023, 11:23 AM

## 2023-06-15 NOTE — Care Management Important Message (Signed)
 Important Message  Patient Details  Name: Jerry Edwards MRN: [id number] Date of Birth: 1971-03-11   Important Message Given:  Yes - Medicare IM     Renie Ora 06/15/2023, 10:21 AM

## 2023-06-15 NOTE — Plan of Care (Signed)

## 2023-06-15 NOTE — Progress Notes (Signed)
 [facility] Progress Note  Name: Jerry Edwards MRN: [id number] DOB: 10-06-1970   Subjective:  He had 450 mL uop over 3/24.  Last HD on 3/24 with 4 kg UF.  Vascular surgery saw him and plan is for OR on 3/26.  Spoke with patient and his mother at bedside.  Discussed not to shower as long as he has a tunneled catheter.     Review of systems:    Denies shortness of breath or chest pain  Denies n/v  ------------------- Background on consult:  Jerry Edwards is an 53 y.o. male with osteogenesis imperfecta- wheelchair bound, OSA, previous systolic dysfunction ( in 2023 EF 30-35%) as well as progressive ckd since 2021- most recently crt over 4. He sees Dr. Gala Romney but last seen in October of 2024- never seen a kidney MD. Now p/w SOB- had had his lasix dose decreased several months ago probably in response to elevated crt. He was noted to be on Jardiance as an OP as well as lasix and potassium. Labs returned a crt of 13 but also potassium of over 7.5. Pt felt poorly for weeks w/ later dec UOP      Intake/Output Summary (Last 24 hours) at 06/15/2023 1321 Last data filed at 06/15/2023 0419 Gross per 24 hour  Intake 360 ml  Output 350 ml  Net 10 ml    Vitals:  Vitals:   06/15/23 0040 06/15/23 0417 06/15/23 0805 06/15/23 1120  BP: (!) 136/90 123/86 138/83 138/82  Pulse: 79 72 80 82  Resp: 18 16 18 18   Temp: 97.8 F (36.6 C) 97.7 F (36.5 C) 97.8 F (36.6 C) 98 F (36.7 C)  TempSrc: Axillary Axillary Oral Oral  SpO2: 90% 100% 96% 94%  Weight:  100.8 kg    Height:         Physical Exam:   General adult male in bed in no acute distress; stature consistent with OI HEENT normocephalic atraumatic extraocular movements intact sclera anicteric Neck supple trachea midline Lungs clear to auscultation bilaterally normal work of breathing at rest on 2 liters  Heart S1S2 no rub Abdomen distended soft nontender  Extremities trace edema  Psych normal mood and affect Neuro -  alert and oriented x 3 provides hx and follows commands Access: Tunneled catheter in place  Medications reviewed   Labs:     Latest Ref Rng & Units 06/15/2023    3:12 AM 06/14/2023    2:20 AM 06/12/2023    4:45 AM  BMP  Glucose 70 - 99 mg/dL 72  83  77   BUN 6 - 20 mg/dL 41  64  24   Creatinine 0.61 - 1.24 mg/dL 4.09  8.11  9.14   Sodium 135 - 145 mmol/L 134  137  133   Potassium 3.5 - 5.1 mmol/L 4.7  5.0  4.2   Chloride 98 - 111 mmol/L 100  100  98   CO2 22 - 32 mmol/L 27  27  27    Calcium 8.9 - 10.3 mg/dL 8.0  8.1  8.0      Assessment/Plan:   1. New ESRD-  progressive CKD as outpatient not previously followed by nephrology per charting.  Most recent GFR as OP in November was 14.   Presented with volume overload, hyperkalemia, and acidosis.  CRRT started on 3/16 mostly to correct hyperkalemia.  Note VT evening of 3/16; CRRT clotted off 3/18 AM.  S/p RIJ TC by VIR on 3/18 --------  - Continue HD per  MWF schedule - next tx here on Wed, 3/26.  He is CLIP'd to [facility] on MWF schedule - Appreciate vascular surgery placing permanent access.  This is scheduled for 3/26 per charting and I have confirmed NPO order is in   2. Hypertension/volume  - fluid overload improved with HD.  Lasix 80 mg IV once today   3. Hyperkalemia - improved with RRT   4. Anemia of CKD -  on aranesp 150 mcg every Sunday.  Iron sats fine.     5.  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  He is getting permanent access placed on 3/26 with VVS  Estanislado Emms, MD 06/15/2023 1:45 PM

## 2023-06-15 NOTE — Consult Note (Addendum)
 [facility] Liaison Consult Note   06/15/2023  Jerry Edwards April 24, 1970 [id number]  Insurance:  [employer] Complete  Primary Care Provider: Dolores Patty, MD This is his cardiologist. Discussed in unit progression meeting regarding needs for a PCP,referral for VBCI follow up.   [facility] Liaison met patient at bedside at [facility]. Spoke with patient at the bedside, mother at bedside and gives permission to speak in front of regarding post hospital follow up needs and finding a new primary care provider.  Patient states he needs a PCP and willing to find a provider.     The patient was screened for [facility] 9 day readmission hospitalization with noted high medium risk score for unplanned readmission risk 1 hospital admissions in 6 months. Patient with a history with [facility] in past.  The patient was assessed for potential [facility] Coordination service needs for post hospital transition for care coordination. Review of patient's electronic medical record reveals patient is admitted with HF and has needs for PCP, will have HD on MWF.  To have HH with [facility] noted.  Plan: [facility] Liaison will continue to follow progress and disposition to assess for post hospital community care coordination/management needs.  Referral request for community care coordination: Will refer to RN CM to assist in finding a new PCP.  Attempts made to find a new PCP but,  currently the provider office was too far away for mother to transport at this time.   VBCI Community Care, Population Health does not replace or interfere with any arrangements made by the Inpatient Transition of Care team.   For questions contact:   Charlesetta Shanks, RN, BSN, CCM [place]  [facility], [facility] Liaison Direct Dial: [phone] or secure chat Email: [email or web address]

## 2023-06-15 NOTE — Progress Notes (Signed)
 Progress Note   Patient: Jerry Edwards HQI:[id number] DOB: 09/22/1970 DOA: 06/06/2023     9 DOS: the patient was seen and examined on 06/15/2023   Brief hospital course: [name] was admitted to the hospital with the working diagnosis of worsening renal failure.   53 yo male with the past medical history of heart failure, osteogenesis imperfecta, CKD stage IV and OSA who presented with dyspnea and edema. Patient with progressive worsening renal function as outpatient. In the ED he had labored breathing, his blood pressure was 120/70, HR 86, RR 14 and 02 saturation 96% on supplemental 02 per [place].  Lungs with decreased breath sounds bilaterally with no wheezing or rhonchi, heart with S1 and S2 present and regular with no gallops or rubs, abdomen with no distention, and positive lower extremity edema.   Na 142, K >7,5 Cl 121, bicarbonate 12, glucose 85 bun 101 cr 13.0  High sensitive troponin 57 and 57  Wbc 6,0 hgb 8,5 plt 166  Sars covid 19 negative  Influenza negative   Chest radiograph with hypoinflation with cardiomegaly, bilateral hilar vascular congestion with bilateral interstitial infiltrates more predominantly at lower lobes.   EKG 77 bpm, left axis deviation, left bundle branch block, left anterior fascicular block, qtc 471, sinus rhythm with no significant ST segment or  T wave changes.   Patient was placed on CRRT for severe acidosis and hyperkalemia.  03/18 tunneled HD catheter.  03/19 transitioned to iHD.  03/20 HD scheduled MWF, pending outpatient CLIP.  03/21 HD today.  03/23 pending outpatient HD arrangements.  03/24 HD today, pending CLIP.   Assessment and Plan: * Acute kidney injury superimposed on chronic kidney disease (HCC) Patient has progressed to ESRD. Hyponatremia.   Clinically euvolemic.  BUN is 41, K 4.7 and P 5.5  Plan for fistula creation tomorrow.  HD scheduled MWF, possible discharge tomorrow (after HD and vascular procedure) or Thursday.  He will  start HD as outpatient on Friday.   Anemia of chronic renal disease. Continue with EPO.   Metabolic bone disease continue with sevelamer.   Acute on chronic systolic heart failure (HCC) Echocardiogram with reduced LV systolic function with EF 35 to 40%, RV systolic function preserved. Moderate mitral valve regurgitation.  LV with akinetic entire apex, hypokinetic anterior wall, antero lateral wall, anterior septum, inferior wall, posterior wall, mid infero septal segment and basal inferoseptal segment.   Volume status has improved with ultrafiltration.  Continue with renal replacement therapy per nephrology recommendations.   Essential hypertension Continue blood pressure monitoring.  Ultrafiltration on HD   Type 2 diabetes mellitus (HCC) Glucose has been stable will stop sliding scale and continue capillary glucose monitoring as needed.  Patient is tolerating po well.   Osteogenesis imperfecta Continue PT and OT Noted old compression fractures.  Pain control with tramadol.   Class 3 obesity Calculated BMI is 43.3         Subjective: Patient is feeling well, no chest pain or dyspnea, he will get AV fistula creation in am.   Physical Exam: Vitals:   06/14/23 2010 06/15/23 0040 06/15/23 0417 06/15/23 0805  BP: (!) 131/92 (!) 136/90 123/86 138/83  Pulse: 87 79 72 80  Resp: 18 18 16 18   Temp: 99.2 F (37.3 C) 97.8 F (36.6 C) 97.7 F (36.5 C) 97.8 F (36.6 C)  TempSrc: Oral Axillary Axillary Oral  SpO2: 98% 90% 100% 96%  Weight:   100.8 kg   Height:       Neurology  awake and alert ENT with mild pallor Cardiovascular with S1 and S2 present and regular with no gallops, rubs or murmurs Respiratory with no rales or wheezing, no rhonchi Abdomen with no distention  No lower extremity edema  Data Reviewed:    Family Communication: no family at the bedside   Disposition: Status is: Inpatient Remains inpatient appropriate because: pending AV fistula creation    Planned Discharge Destination: Home     Author: Coralie Keens, MD 06/15/2023 11:23 AM  For on call review [email or web address].

## 2023-06-16 ENCOUNTER — Encounter (HOSPITAL_COMMUNITY)
Admission: EM | Disposition: A | Discharge status: Home / Self Care | Payer: Self-pay | Source: Home / Self Care | Attending: Internal Medicine

## 2023-06-16 ENCOUNTER — Inpatient Hospital Stay (HOSPITAL_COMMUNITY): Admitting: Certified Registered Nurse Anesthetist

## 2023-06-16 ENCOUNTER — Other Ambulatory Visit: Payer: Self-pay

## 2023-06-16 ENCOUNTER — Encounter (HOSPITAL_COMMUNITY): Payer: Self-pay | Admitting: Internal Medicine

## 2023-06-16 DIAGNOSIS — Z992 Dependence on renal dialysis: Secondary | ICD-10-CM | POA: Diagnosis not present

## 2023-06-16 DIAGNOSIS — N179 Acute kidney failure, unspecified: Secondary | ICD-10-CM | POA: Diagnosis not present

## 2023-06-16 DIAGNOSIS — N186 End stage renal disease: Secondary | ICD-10-CM

## 2023-06-16 DIAGNOSIS — N189 Chronic kidney disease, unspecified: Secondary | ICD-10-CM | POA: Diagnosis not present

## 2023-06-16 DIAGNOSIS — I132 Hypertensive heart and chronic kidney disease with heart failure and with stage 5 chronic kidney disease, or end stage renal disease: Secondary | ICD-10-CM

## 2023-06-16 DIAGNOSIS — Z87891 Personal history of nicotine dependence: Secondary | ICD-10-CM

## 2023-06-16 DIAGNOSIS — I5023 Acute on chronic systolic (congestive) heart failure: Secondary | ICD-10-CM

## 2023-06-16 HISTORY — PX: AV FISTULA PLACEMENT: SHX1204

## 2023-06-16 LAB — CBC
HCT: 28.4 % — ABNORMAL LOW (ref 39.0–52.0)
Hemoglobin: 8.3 g/dL — ABNORMAL LOW (ref 13.0–17.0)
MCH: 28.8 pg (ref 26.0–34.0)
MCHC: 29.2 g/dL — ABNORMAL LOW (ref 30.0–36.0)
MCV: 98.6 fL (ref 80.0–100.0)
Platelets: 196 10*3/uL (ref 150–400)
RBC: 2.88 MIL/uL — ABNORMAL LOW (ref 4.22–5.81)
RDW: 14.5 % (ref 11.5–15.5)
WBC: 7 10*3/uL (ref 4.0–10.5)
nRBC: 0.3 % — ABNORMAL HIGH (ref 0.0–0.2)

## 2023-06-16 LAB — GLUCOSE, CAPILLARY
Glucose-Capillary: 75 mg/dL (ref 70–99)
Glucose-Capillary: 68 mg/dL — ABNORMAL LOW (ref 70–99)
Glucose-Capillary: 106 mg/dL — ABNORMAL HIGH (ref 70–99)
Glucose-Capillary: 65 mg/dL — ABNORMAL LOW (ref 70–99)
Glucose-Capillary: 94 mg/dL (ref 70–99)
Glucose-Capillary: 78 mg/dL (ref 70–99)
Glucose-Capillary: 184 mg/dL — ABNORMAL HIGH (ref 70–99)
Glucose-Capillary: 163 mg/dL — ABNORMAL HIGH (ref 70–99)

## 2023-06-16 LAB — TYPE AND SCREEN
ABO/RH(D): A POS
Antibody Screen: NEGATIVE

## 2023-06-16 LAB — RENAL FUNCTION PANEL
Albumin: 1.8 g/dL — ABNORMAL LOW (ref 3.5–5.0)
Anion gap: 13 (ref 5–15)
BUN: 61 mg/dL — ABNORMAL HIGH (ref 6–20)
CO2: 24 mmol/L (ref 22–32)
Calcium: 8.2 mg/dL — ABNORMAL LOW (ref 8.9–10.3)
Chloride: 99 mmol/L (ref 98–111)
Creatinine, Ser: 7.17 mg/dL — ABNORMAL HIGH (ref 0.61–1.24)
GFR, Estimated: 8 mL/min — ABNORMAL LOW (ref >60)
Glucose, Bld: 79 mg/dL (ref 70–99)
Phosphorus: 7.4 mg/dL — ABNORMAL HIGH (ref 2.5–4.6)
Potassium: 4.7 mmol/L (ref 3.5–5.1)
Sodium: 136 mmol/L (ref 135–145)

## 2023-06-16 SURGERY — ARTERIOVENOUS (AV) FISTULA CREATION
Anesthesia: Monitor Anesthesia Care | Site: Arm Upper | Laterality: Left

## 2023-06-16 MED ORDER — ARTIFICIAL TEARS OPHTHALMIC OINT
TOPICAL_OINTMENT | OPHTHALMIC | Status: DC | PRN
  Administered 2023-06-16: 1 via OPHTHALMIC

## 2023-06-16 MED ORDER — CEFAZOLIN SODIUM-DEXTROSE 2-4 GM/100ML-% IV SOLN
INTRAVENOUS | Status: AC
  Filled 2023-06-16: qty 100

## 2023-06-16 MED ORDER — LIDOCAINE-EPINEPHRINE (PF) 1 %-1:200000 IJ SOLN
INTRAMUSCULAR | Status: AC
  Filled 2023-06-16: qty 30

## 2023-06-16 MED ORDER — MIDAZOLAM HCL 2 MG/2ML IJ SOLN: 2.0000 mg | Freq: Once | INTRAMUSCULAR | Status: AC

## 2023-06-16 MED ORDER — FENTANYL CITRATE (PF) 100 MCG/2ML IJ SOLN: 50.0000 ug | Freq: Once | INTRAMUSCULAR | Status: AC

## 2023-06-16 MED ORDER — FENTANYL CITRATE (PF) 100 MCG/2ML IJ SOLN: 25.0000 ug | INTRAMUSCULAR | Status: DC | PRN

## 2023-06-16 MED ORDER — ONDANSETRON HCL 4 MG/2ML IJ SOLN: 4.0000 mg | Freq: Once | INTRAMUSCULAR | Status: DC | PRN

## 2023-06-16 MED ORDER — LIDOCAINE HCL (PF) 1 % IJ SOLN
INTRAMUSCULAR | Status: AC
  Filled 2023-06-16: qty 30

## 2023-06-16 MED ORDER — 0.9 % SODIUM CHLORIDE (POUR BTL) OPTIME
TOPICAL | Status: DC | PRN
  Administered 2023-06-16: 1000 mL

## 2023-06-16 MED ORDER — CHLORHEXIDINE GLUCONATE 0.12 % MT SOLN: 15.0000 mL | Freq: Once | OROMUCOSAL | Status: AC

## 2023-06-16 MED ORDER — HEPARIN SODIUM (PORCINE) 1000 UNIT/ML IJ SOLN
INTRAMUSCULAR | Status: DC | PRN
Start: 2023-06-16 — End: 2023-06-16
  Administered 2023-06-16: 5000 [IU] via INTRAVENOUS

## 2023-06-16 MED ORDER — DEXAMETHASONE SODIUM PHOSPHATE 10 MG/ML IJ SOLN
INTRAMUSCULAR | Status: DC | PRN
  Administered 2023-06-16: 5 mg via INTRAVENOUS

## 2023-06-16 MED ORDER — HEPARIN 6000 UNIT IRRIGATION SOLUTION
Status: DC | PRN
  Administered 2023-06-16: 1

## 2023-06-16 MED ORDER — HEPARIN 6000 UNIT IRRIGATION SOLUTION
Status: AC
  Filled 2023-06-16: qty 500

## 2023-06-16 MED ORDER — CHLORHEXIDINE GLUCONATE 0.12 % MT SOLN
OROMUCOSAL | Status: AC
  Administered 2023-06-16: 15 mL via OROMUCOSAL
  Filled 2023-06-16: qty 15

## 2023-06-16 MED ORDER — DEXMEDETOMIDINE HCL IN NACL 80 MCG/20ML IV SOLN
INTRAVENOUS | Status: AC
  Filled 2023-06-16: qty 20

## 2023-06-16 MED ORDER — OXYCODONE HCL 5 MG/5ML PO SOLN: 5.0000 mg | Freq: Once | ORAL | Status: DC | PRN

## 2023-06-16 MED ORDER — MEPIVACAINE HCL (PF) 2 % IJ SOLN
INTRAMUSCULAR | Status: DC | PRN
  Administered 2023-06-16: 20 mL

## 2023-06-16 MED ORDER — FENTANYL CITRATE (PF) 100 MCG/2ML IJ SOLN
INTRAMUSCULAR | Status: AC
Start: 2023-06-16 — End: 2023-06-16
  Administered 2023-06-16: 50 ug via INTRAVENOUS
  Filled 2023-06-16: qty 2

## 2023-06-16 MED ORDER — CEFAZOLIN SODIUM-DEXTROSE 2-3 GM-%(50ML) IV SOLR
INTRAVENOUS | Status: DC | PRN
  Administered 2023-06-16: 2 g via INTRAVENOUS

## 2023-06-16 MED ORDER — SODIUM CHLORIDE 0.9 % IV SOLN: INTRAVENOUS | Status: DC

## 2023-06-16 MED ORDER — ACETAMINOPHEN 10 MG/ML IV SOLN: 1000.0000 mg | Freq: Once | INTRAVENOUS | Status: DC | PRN

## 2023-06-16 MED ORDER — MIDAZOLAM HCL 2 MG/2ML IJ SOLN
INTRAMUSCULAR | Status: AC
  Administered 2023-06-16: 2 mg via INTRAVENOUS
  Filled 2023-06-16: qty 2

## 2023-06-16 MED ORDER — SEVELAMER CARBONATE 800 MG PO TABS
1600.0000 mg | ORAL_TABLET | Freq: Three times a day (TID) | ORAL | Status: DC
  Administered 2023-06-16 – 2023-06-18 (×5): 1600 mg via ORAL
  Filled 2023-06-16 (×5): qty 2

## 2023-06-16 MED ORDER — MEPIVACAINE HCL (PF) 2 % IJ SOLN: INTRAMUSCULAR | Status: DC | PRN

## 2023-06-16 MED ORDER — OXYCODONE HCL 5 MG PO TABS: 5.0000 mg | ORAL_TABLET | Freq: Once | ORAL | Status: DC | PRN

## 2023-06-16 MED ORDER — ORAL CARE MOUTH RINSE: 15.0000 mL | Freq: Once | OROMUCOSAL | Status: AC

## 2023-06-16 MED ORDER — PROPOFOL 1000 MG/100ML IV EMUL
INTRAVENOUS | Status: AC
  Filled 2023-06-16: qty 100

## 2023-06-16 MED ORDER — ONDANSETRON HCL 4 MG/2ML IJ SOLN
INTRAMUSCULAR | Status: DC | PRN
Start: 2023-06-16 — End: 2023-06-16
  Administered 2023-06-16: 4 mg via INTRAVENOUS

## 2023-06-16 MED ORDER — DEXMEDETOMIDINE HCL IN NACL 80 MCG/20ML IV SOLN
INTRAVENOUS | Status: DC | PRN
  Administered 2023-06-16 (×3): 4 ug via INTRAVENOUS
  Administered 2023-06-16 (×3): 8 ug via INTRAVENOUS
  Administered 2023-06-16: 4 ug via INTRAVENOUS

## 2023-06-16 MED ORDER — DEXTROSE 50 % IV SOLN
12.5000 g | INTRAVENOUS | Status: AC
  Administered 2023-06-16: 12.5 g via INTRAVENOUS
  Filled 2023-06-16: qty 50

## 2023-06-16 SURGICAL SUPPLY — 32 items
ARMBAND PINK RESTRICT EXTREMIT (MISCELLANEOUS) ×2 IMPLANT
BAG COUNTER SPONGE SURGICOUNT (BAG) ×1 IMPLANT
BLADE CLIPPER SURG (BLADE) ×1 IMPLANT
CANISTER SUCT 3000ML PPV (MISCELLANEOUS) ×1 IMPLANT
CLIP TI MEDIUM 6 (CLIP) ×1 IMPLANT
CLIP TI WIDE RED SMALL 6 (CLIP) ×1 IMPLANT
COVER PROBE W GEL 5X96 (DRAPES) ×1 IMPLANT
DERMABOND ADVANCED .7 DNX12 (GAUZE/BANDAGES/DRESSINGS) ×1 IMPLANT
ELECT REM PT RETURN 9FT ADLT (ELECTROSURGICAL) ×1 IMPLANT
ELECTRODE REM PT RTRN 9FT ADLT (ELECTROSURGICAL) ×1 IMPLANT
GLOVE BIO SURGEON STRL SZ7.5 (GLOVE) ×1 IMPLANT
GLOVE BIOGEL PI IND STRL 8 (GLOVE) ×1 IMPLANT
GOWN STRL REUS W/ TWL LRG LVL3 (GOWN DISPOSABLE) ×2 IMPLANT
GOWN STRL REUS W/ TWL XL LVL3 (GOWN DISPOSABLE) ×2 IMPLANT
HEMOSTAT SPONGE AVITENE ULTRA (HEMOSTASIS) IMPLANT
KIT BASIN OR (CUSTOM PROCEDURE TRAY) ×1 IMPLANT
KIT TURNOVER KIT B (KITS) ×1 IMPLANT
NS IRRIG 1000ML POUR BTL (IV SOLUTION) ×1 IMPLANT
PACK CV ACCESS (CUSTOM PROCEDURE TRAY) ×1 IMPLANT
PAD ARMBOARD POSITIONER FOAM (MISCELLANEOUS) ×2 IMPLANT
SLING ARM FOAM STRAP LRG (SOFTGOODS) IMPLANT
SLING ARM FOAM STRAP MED (SOFTGOODS) IMPLANT
SPIKE FLUID TRANSFER (MISCELLANEOUS) ×1 IMPLANT
SUT MNCRL AB 4-0 PS2 18 (SUTURE) ×1 IMPLANT
SUT PROLENE 6 0 BV (SUTURE) ×1 IMPLANT
SUT PROLENE 6 0 CC (SUTURE) IMPLANT
SUT PROLENE 7 0 BV 1 (SUTURE) IMPLANT
SUT SILK 3-0 18XBRD TIE 12 (SUTURE) IMPLANT
SUT VIC AB 3-0 SH 27X BRD (SUTURE) ×1 IMPLANT
TOWEL GREEN STERILE (TOWEL DISPOSABLE) ×1 IMPLANT
UNDERPAD 30X36 HEAVY ABSORB (UNDERPADS AND DIAPERS) ×1 IMPLANT
WATER STERILE IRR 1000ML POUR (IV SOLUTION) ×1 IMPLANT

## 2023-06-16 NOTE — Discharge Instructions (Signed)
   [facility]  Discharge Instructions  AV Fistula or Graft Surgery for Dialysis Access  Please refer to the following instructions for your post-procedure care. Your surgeon or physician assistant will discuss any changes with you.  Activity  You may drive the day following your surgery, if you are comfortable and no longer taking prescription pain medication. Resume full activity as the soreness in your incision resolves.  Bathing/Showering  You may shower after you go home. Keep your incision dry for 48 hours. Do not soak in a bathtub, hot tub, or swim until the incision heals completely. You may not shower if you have a hemodialysis catheter.  Incision Care  Clean your incision with mild soap and water after 48 hours. Pat the area dry with a clean towel. You do not need a bandage unless otherwise instructed. Do not apply any ointments or creams to your incision. You may have skin glue on your incision. Do not peel it off. It will come off on its own in about one week. Your arm may swell a bit after surgery. To reduce swelling use pillows to elevate your arm so it is above your heart. Your doctor will tell you if you need to lightly wrap your arm with an ACE bandage.  Diet  Resume your normal diet. There are not special food restrictions following this procedure. In order to heal from your surgery, it is CRITICAL to get adequate nutrition. Your body requires vitamins, minerals, and protein. Vegetables are the best source of vitamins and minerals. Vegetables also provide the perfect balance of protein. Processed food has little nutritional value, so try to avoid this.  Medications  Resume taking all of your medications. If your incision is causing pain, you may take over-the counter pain relievers such as acetaminophen (Tylenol). If you were prescribed a stronger pain medication, please be aware these medications can cause nausea and constipation. Prevent  nausea by taking the medication with a snack or meal. Avoid constipation by drinking plenty of fluids and eating foods with high amount of fiber, such as fruits, vegetables, and grains.  Do not take Tylenol if you are taking prescription pain medications.  Follow up Your surgeon may want to see you in the office following your access surgery. If so, this will be arranged at the time of your surgery.  Please call us immediately for any of the following conditions:  Increased pain, redness, drainage (pus) from your incision site Fever of 101 degrees or higher Severe or worsening pain at your incision site Hand pain or numbness.  Reduce your risk of vascular disease:  Stop smoking. If you would like help, call [facility] at 1-800-QUIT-NOW (857 141 3158) or [place] at (701) 466-8405  Manage your cholesterol Maintain a desired weight Control your diabetes Keep your blood pressure down  Dialysis  It will take several weeks to several months for your new dialysis access to be ready for use. Your surgeon will determine when it is okay to use it. Your nephrologist will continue to direct your dialysis. You can continue to use your Permcath until your new access is ready for use.   06/16/2023 Jerry Edwards [id number] 1970/04/20  Surgeon(s): Cephus Shelling, MD  Procedure(s): Left arm ARTERIOVENOUS (AV) FISTULA CREATION  x Do not stick fistula for 12 weeks    If you have any questions, please call the office at [phone].

## 2023-06-16 NOTE — Progress Notes (Signed)
   06/16/23 1834  Vitals  Temp 97.6 F (36.4 C)  Pulse Rate 79  Resp (!) 27  BP 121/84  SpO2 91 %  O2 Device Nasal Cannula  Oxygen Therapy  O2 Flow Rate (L/min) 2 L/min  Patient Activity (if Appropriate) In bed  Pulse Oximetry Type Continuous  Oximetry Probe Site Changed No  Post Treatment  Dialyzer Clearance Lightly streaked  Hemodialysis Intake (mL) 100 mL  Liters Processed 72  Fluid Removed (mL) 3400 mL  Tolerated HD Treatment Yes   Received patient in bed to unit.  Alert and oriented.  Informed consent signed and in chart.   TX duration: Three hours  Patient tolerated well.  Transported back to the room  Alert, without acute distress.  Hand-off given to patient's nurse.   Access used: Right chest catheter Access issues: none  Medication(s) given: Tramadol 50mg

## 2023-06-16 NOTE — Progress Notes (Signed)
 [facility] [place]  Subjective  - no complaints.   Objective (!) 145/90 82 98 F (36.7 C) (Oral) 18 95%  Intake/Output Summary (Last 24 hours) at 06/16/2023 0936 Last data filed at 06/16/2023 0653 Gross per 24 hour  Intake --  Output 500 ml  Net -500 ml    Right IJ TDC Left radial brachial pulse palpable  Laboratory Lab Results: Recent Labs    06/15/23 0312 06/16/23 0247  WBC 6.9 7.0  HGB 8.3* 8.3*  HCT 27.7* 28.4*  PLT 180 196   BMET Recent Labs    06/15/23 0312 06/16/23 0247  NA 134* 136  K 4.7 4.7  CL 100 99  CO2 27 24  GLUCOSE 72 79  BUN 41* 61*  CREATININE 5.52* 7.17*  CALCIUM 8.0* 8.2*    COAG Lab Results  Component Value Date   INR 1.0 02/08/2021   INR 0.98 03/28/2015   INR 1.05 04/09/2013   No results found for: "PTT"  Assessment/Planning:  53 year old male with new ESRD needing permanent dialysis access.  He has a TDC already.  Plan left arm AV fistula today.  Has a good cephalic vein on vein mapping.  Risk benefits discussed.  Cephus Shelling 06/16/2023 9:36 AM --

## 2023-06-16 NOTE — Progress Notes (Signed)
 [facility] Progress Note  Name: Jerry Edwards MRN: [id number] DOB: [birth year]   Subjective:  He had 500 mL uop over 3/25.  Seen and examined on dialysis.  Blood pressure 143/105 and HR 102.  Tolerating goal.  Tunneled catheter in place.  he had a left brachiocephalic AVF placed with Dr. Chestine Spore today.  Still getting feeling back in his hand after the block   Review of systems:      Denies shortness of breath or chest pain  Denies n/v  ------------------- Background on consult:  Jerry Edwards is an 53 y.o. male with osteogenesis imperfecta- wheelchair bound, OSA, previous systolic dysfunction ( in 2023 EF 30-35%) as well as progressive ckd since 2021- most recently crt over 4. He sees Dr. Gala Romney but last seen in October of 2024- never seen a kidney MD. Now p/w SOB- had had his lasix dose decreased several months ago probably in response to elevated crt. He was noted to be on Jardiance as an OP as well as lasix and potassium. Labs returned a crt of 13 but also potassium of over 7.5. Pt felt poorly for weeks w/ later dec UOP      Intake/Output Summary (Last 24 hours) at 06/16/2023 1536 Last data filed at 06/16/2023 1126 Gross per 24 hour  Intake 200 ml  Output 550 ml  Net -350 ml    Vitals:  Vitals:   06/16/23 1145 06/16/23 1200 06/16/23 1229 06/16/23 1500  BP: 128/88 130/85 123/77 (!) 132/93  Pulse: 80 81 82 82  Resp: 17 18 20 19   Temp:  97.8 F (36.6 C) 97.9 F (36.6 C) 97.7 F (36.5 C)  TempSrc:   Oral   SpO2: 93% 94% 93% 96%  Weight:    99 kg  Height:         Physical Exam:     General adult male in bed in no acute distress; stature consistent with OI HEENT normocephalic atraumatic extraocular movements intact sclera anicteric Neck supple trachea midline Lungs clear to auscultation bilaterally normal work of breathing at rest on 2 liters  Heart S1S2 no rub Abdomen distended soft nontender  Extremities trace edema  Psych normal mood and affect Neuro  - alert and oriented x 3 provides hx and follows commands Access: RIJ Tunneled catheter in place; LUE AVF with bruit and thrill   Medications reviewed   Labs:     Latest Ref Rng & Units 06/16/2023    2:47 AM 06/15/2023    3:12 AM 06/14/2023    2:20 AM  BMP  Glucose 70 - 99 mg/dL 79  72  83   BUN 6 - 20 mg/dL 61  41  64   Creatinine 0.61 - 1.24 mg/dL 8.29  5.62  1.30   Sodium 135 - 145 mmol/L 136  134  137   Potassium 3.5 - 5.1 mmol/L 4.7  4.7  5.0   Chloride 98 - 111 mmol/L 99  100  100   CO2 22 - 32 mmol/L 24  27  27    Calcium 8.9 - 10.3 mg/dL 8.2  8.0  8.1      Assessment/Plan:   1. New ESRD-  progressive CKD as outpatient not previously followed by nephrology per charting.  Most recent GFR as OP in November was 14.   Presented with volume overload, hyperkalemia, and acidosis.  CRRT started on 3/16 mostly to correct hyperkalemia.  Note VT evening of 3/16; CRRT clotted off 3/18 AM.  S/p RIJ TC by VIR  on 3/18 --------  - Continue HD per MWF schedule.  He is CLIP'd to [facility] on MWF schedule - Appreciate VVS - he had a left brachiocephalic AVF placed with Dr. Chestine Spore today    2. Hypertension/volume  - fluid overload improved with HD.  Intermittent lasix and would give lasix 80 mg daily on non-HD days on discharge   3. Hyperkalemia - improved with RRT   4. Anemia of CKD -  on aranesp 150 mcg every Sunday.  Iron sats fine.     5.  Renal osteodystrophy- increase Renvela to 2 tablets with meals and on HD  Disposition - per primary team.  Now has HD unit and permanent access   Estanislado Emms, MD 06/16/2023 3:46 PM

## 2023-06-16 NOTE — Anesthesia Preprocedure Evaluation (Signed)
 Anesthesia Evaluation  Patient identified by MRN, date of birth, ID band Patient awake    Reviewed: Allergy & Precautions, NPO status , Patient's Chart, lab work & pertinent test results, reviewed documented beta blocker date and time   History of Anesthesia Complications Negative for: history of anesthetic complications  Airway Mallampati: IV  TM Distance: >3 FB Neck ROM: Limited    Dental no notable dental hx.    Pulmonary sleep apnea , former smoker    + decreased breath sounds      Cardiovascular hypertension, +CHF   Rhythm:Regular Rate:Normal + Systolic murmurs [id number]%, mod MR   Neuro/Psych    GI/Hepatic ,neg GERD  ,,(+) neg Cirrhosis        Endo/Other  diabetes, Type 2    Renal/GU ARF, ESRF and DialysisRenal disease     Musculoskeletal   Abdominal  (+) + obese  Peds  Hematology  (+) Blood dyscrasia, anemia   Anesthesia Other Findings Osteogenesis imperfecta  Reproductive/Obstetrics                              Anesthesia Physical Anesthesia Plan  ASA: 4  Anesthesia Plan: Regional and MAC   Post-op Pain Management: Regional block*   Induction: Intravenous  PONV Risk Score and Plan: 2 and Ondansetron and Dexamethasone  Airway Management Planned:   Additional Equipment:   Intra-op Plan:   Post-operative Plan:   Informed Consent: I have reviewed the patients History and Physical, chart, labs and discussed the procedure including the risks, benefits and alternatives for the proposed anesthesia with the patient or authorized representative who has indicated his/her understanding and acceptance.     Dental advisory given  Plan Discussed with: CRNA  Anesthesia Plan Comments:          Anesthesia Quick Evaluation

## 2023-06-16 NOTE — Progress Notes (Signed)
 Patient returned to room via bed by transporter.

## 2023-06-16 NOTE — Transfer of Care (Signed)
 Immediate Anesthesia Transfer of Care Note  Patient: Jerry Edwards  Procedure(s) Performed: LEFT BRACHIOCEPHALIC ARTERIOVENOUS (AV) FISTULA CREATION (Left: Arm Upper)  Patient Location: PACU  Anesthesia Type:MAC combined with regional for post-op pain  Level of Consciousness: awake, alert , oriented, and patient cooperative  Airway & Oxygen Therapy: Patient Spontanous Breathing and Patient connected to nasal cannula oxygen  Post-op Assessment: Report given to RN and Post -op Vital signs reviewed and stable  Post vital signs: Reviewed and stable  Last Vitals:  Vitals Value Taken Time  BP 127/81 1134  Temp    Pulse 72 06/16/23 1134  Resp 18 06/16/23 1134  SpO2 94 % 06/16/23 1134  Vitals shown include unfiled device data.  Last Pain:  Vitals:   06/16/23 0940  TempSrc:   PainSc: 0-No pain         Complications: No notable events documented.

## 2023-06-16 NOTE — Anesthesia Procedure Notes (Signed)
 Anesthesia Regional Block: Supraclavicular block   Pre-Anesthetic Checklist: , timeout performed,  Correct Patient, Correct Site, Correct Laterality,  Correct Procedure, Correct Position, site marked,  Risks and benefits discussed,  Surgical consent,  Pre-op evaluation,  At surgeon's request and post-op pain management  Laterality: Left  Prep: Maximum Sterile Barrier Precautions used, chloraprep       Needles:  Injection technique: Single-shot  Needle Type: Echogenic Needle     Needle Length: 5cm  Needle Gauge: 22     Additional Needles:   Procedures:,,,, ultrasound used (permanent image in chart),,    Narrative:  Start time: 06/16/2023 9:45 AM End time: 06/16/2023 9:50 AM Injection made incrementally with aspirations every 5 mL.  Performed by: Personally  Anesthesiologist: Mariann Barter, MD

## 2023-06-16 NOTE — Progress Notes (Signed)
Patient returned to room via bed from PACU.

## 2023-06-16 NOTE — Progress Notes (Signed)
 Patient transported to HD via bed by transporter at 1430.

## 2023-06-16 NOTE — Op Note (Signed)
 OPERATIVE NOTE  DATE: June 16, 2023  PROCEDURE: left brachiocephalic arteriovenous fistula placement  PRE-OPERATIVE DIAGNOSIS: end stage renal disease   POST-OPERATIVE DIAGNOSIS: same as above   SURGEON: Cephus Shelling, MD  ASSISTANT(S): OR staff  ANESTHESIA: general  ESTIMATED BLOOD LOSS: <50 mL  FINDING(S): 1.  Cephalic vein: 4 mm, acceptable 2.  Brachial artery: 5 mm, atherosclerotic disease evident 3.  Venous outflow: palpable thrill  4.  Radial flow: palpable radial pulse  SPECIMEN(S):  none  INDICATIONS:   Jerry Edwards is a 53 y.o. male who presents with ESRD and needs permanent hemodialysis access.  The patient is scheduled for left arm arteriovenous fistula placement.  The patient is aware the risks include but are not limited to: bleeding, infection, steal syndrome, nerve damage, ischemic monomelic neuropathy, failure to mature, and need for additional procedures.  The patient is aware of the risks of the procedure and elects to proceed forward.   DESCRIPTION: After full informed written consent was obtained from the patient, the patient was brought back to the operating room and placed supine upon the operating table.  Prior to induction, the patient received IV antibiotics.   After obtaining adequate anesthesia, the patient was then prepped and draped in the standard fashion for a left arm access procedure.  I turned my attention first to identifying the patient's cephalic vein and brachial artery.  Using SonoSite guidance, the location of these vessels were marked out on the skin.   These looked to be of good caliber at the elbow.  I made a transverse incision at the level of the antecubitum and dissected through the subcutaneous tissue and fascia to gain exposure of the brachial artery.  This was noted to be 5 mm in diameter externally.  This was dissected out proximally and distally and controlled with vessel loops .  I then dissected out the cephalic  vein.  This was noted to be 4 mm in diameter externally.  The distal segment of the vein was ligated with a  2-0 silk, and the vein was transected.  The proximal segment was interrogated with serial dilators.  The vein accepted up to a 4.5 mm dilator without any difficulty.  I then instilled the heparinized saline into the vein and clamped it.  At this point, I reset my exposure of the brachial artery.  The patient was given 5,000 units IV heparin.  I then placed the artery under tension proximally and distally.  I made an arteriotomy with a #11 blade, and then I extended the arteriotomy with a Potts scissor.  I injected heparinized saline proximal and distal to this arteriotomy.  The vein was then sewn to the artery in an end-to-side configuration with a running stitch of 6-0 Prolene.  Prior to completing this anastomosis, I allowed the vein and artery to backbleed.  There was no evidence of clot from any vessels.  I completed the anastomosis in the usual fashion and then released all vessel loops and clamps.  I did have to put a repair suture in the anastomosis with 6-0 Prolene.  There was a palpable thrill in the venous outflow, and there was a palpable radial pulse.  At this point, I irrigated out the surgical wound.  There was no further active bleeding.  The subcutaneous tissue was reapproximated with a running stitch of 3-0 Vicryl.  The skin was then reapproximated with a running subcuticular stitch of 4-0 Monocryl.  The skin was then cleaned, dried, and reinforced  with Dermabond.  The patient tolerated this procedure well.   COMPLICATIONS: None  CONDITION: Stable  Cephus Shelling, MD [facility] Office: [phone]  Cephus Shelling   06/16/2023, 11:27 AM

## 2023-06-16 NOTE — Anesthesia Postprocedure Evaluation (Signed)
 Anesthesia Post Note  Patient: Cyruss Arata  Procedure(s) Performed: LEFT BRACHIOCEPHALIC ARTERIOVENOUS (AV) FISTULA CREATION (Left: Arm Upper)     Patient location during evaluation: PACU Anesthesia Type: Regional and MAC Level of consciousness: awake and alert Pain management: pain level controlled Vital Signs Assessment: post-procedure vital signs reviewed and stable Respiratory status: spontaneous breathing, nonlabored ventilation, respiratory function stable and patient connected to nasal cannula oxygen Cardiovascular status: stable and blood pressure returned to baseline Postop Assessment: no apparent nausea or vomiting Anesthetic complications: no   No notable events documented.  Last Vitals:  Vitals:   06/16/23 1200 06/16/23 1229  BP: 130/85 123/77  Pulse: 81 82  Resp: 18 20  Temp: 36.6 C 36.6 C  SpO2: 94% 93%    Last Pain:  Vitals:   06/16/23 1229  TempSrc: Oral  PainSc:                  Mariann Barter

## 2023-06-16 NOTE — Plan of Care (Signed)
   Problem: Education: Goal: Ability to describe self-care measures that may prevent or decrease complications (Diabetes Survival Skills Education) will improve Outcome: Progressing Goal: Individualized Educational Video(s) Outcome: Progressing   Problem: Coping: Goal: Ability to adjust to condition or change in health will improve Outcome: Progressing

## 2023-06-16 NOTE — Progress Notes (Signed)
 OT Cancellation Note  Patient Details Name: Jerry Edwards MRN: [id number] DOB: [birth year]   Cancelled Treatment:    Reason Eval/Treat Not Completed: Patient at procedure or test/ unavailable (Pt currently off unit for scheduled AV fistula creation. OT to reattempt to see pt at a later time as appropriate/available.)  Raenette Sakata "[name]" M., OTR/L, MA Acute Rehab [phone]   [name] 06/16/2023, 9:12 AM

## 2023-06-16 NOTE — Progress Notes (Signed)
 Patient's blood sugar= 68 at 0756. Notified Tristan Schroeder, RN during verbal report and requested to have time to notify MD and treat prior to transfer. Transport arrived during this handoff. Tristan Schroeder states that transport to OR cannot wait and to send patient because they will recheck and treat in pre-op. Dr. Knox Saliva notified of all. No further orders received.

## 2023-06-17 ENCOUNTER — Encounter (HOSPITAL_COMMUNITY): Payer: Self-pay | Admitting: Vascular Surgery

## 2023-06-17 ENCOUNTER — Other Ambulatory Visit (HOSPITAL_COMMUNITY): Payer: Self-pay

## 2023-06-17 LAB — BASIC METABOLIC PANEL WITH GFR
Anion gap: 9 (ref 5–15)
BUN: 39 mg/dL — ABNORMAL HIGH (ref 6–20)
CO2: 29 mmol/L (ref 22–32)
Calcium: 8 mg/dL — ABNORMAL LOW (ref 8.9–10.3)
Chloride: 96 mmol/L — ABNORMAL LOW (ref 98–111)
Creatinine, Ser: 5.41 mg/dL — ABNORMAL HIGH (ref 0.61–1.24)
GFR, Estimated: 12 mL/min — ABNORMAL LOW (ref >60)
Glucose, Bld: 111 mg/dL — ABNORMAL HIGH (ref 70–99)
Potassium: 4.4 mmol/L (ref 3.5–5.1)
Sodium: 134 mmol/L — ABNORMAL LOW (ref 135–145)

## 2023-06-17 LAB — GLUCOSE, CAPILLARY
Glucose-Capillary: 110 mg/dL — ABNORMAL HIGH (ref 70–99)
Glucose-Capillary: 112 mg/dL — ABNORMAL HIGH (ref 70–99)

## 2023-06-17 MED ORDER — CHLORHEXIDINE GLUCONATE CLOTH 2 % EX PADS: 6.0000 | MEDICATED_PAD | Freq: Every day | CUTANEOUS | Status: DC

## 2023-06-17 MED ORDER — SEVELAMER CARBONATE 800 MG PO TABS
1600.0000 mg | ORAL_TABLET | Freq: Three times a day (TID) | ORAL | 0 refills | Status: DC
  Filled 2023-06-17: qty 90, 15d supply, fill #0

## 2023-06-17 MED ORDER — FUROSEMIDE 80 MG PO TABS
80.0000 mg | ORAL_TABLET | ORAL | Status: DC
Start: 2023-06-17 — End: 2023-06-18

## 2023-06-17 MED ORDER — TRAMADOL HCL 50 MG PO TABS
50.0000 mg | ORAL_TABLET | Freq: Three times a day (TID) | ORAL | 0 refills | Status: DC | PRN
  Filled 2023-06-17: qty 21, 7d supply, fill #0

## 2023-06-17 MED ORDER — POLYETHYLENE GLYCOL 3350 17 GM/SCOOP PO POWD
17.0000 g | Freq: Every day | ORAL | 0 refills | Status: AC | PRN
  Filled 2023-06-17: qty 238, 14d supply, fill #0

## 2023-06-17 NOTE — Plan of Care (Signed)
  Problem: Fluid Volume: Goal: Ability to maintain a balanced intake and output will improve Outcome: Progressing   Problem: Health Behavior/Discharge Planning: Goal: Ability to manage health-related needs will improve Outcome: Progressing   Problem: Clinical Measurements: Goal: Ability to maintain clinical measurements within normal limits will improve Outcome: Progressing   Problem: Nutrition: Goal: Adequate nutrition will be maintained Outcome: Progressing

## 2023-06-17 NOTE — Progress Notes (Addendum)
 Occupational Therapy Treatment Patient Details Name: Jerry Edwards MRN: [id number] DOB: 24-May-1970 Today's Date: 06/17/2023   History of present illness 53 y.o. male [facility] to [facility]/06[facility]23 with SOB, swelling of hands and feet, and decreased urine output.Pt with acute renal failure, small B effusions, multiple compression deformities of lower thoracic and lumbar spine (no acute fx), multiple remote B rib fxs, and cardiomegaly with pulm vascular congestion. 3/16-3/18 CRRT. Left brachiocephalic arteriovenous fistula placement on 3/26. PMHx: osteogenesis imperfecta, HFrEF, CKD stage 4, pulmonary HTN, obesity   OT comments  OT session focused on training in techniques for increased safety and independence with ADLs and functional transfers during/in preparation for functional tasks. Pt currently demonstrates ability to largely complete UB ADLs with Mod I in sitting and functional stand-pivot transfers with Supervision for safety. Pt's new wheelchair had been delivered to pt just prior to OT arrival. Upon transferring to new w/c during OT session, w/c arm rests and posterior/anterior positioning of rear wheels found to not be appropriate for pt as the positioning of each caused pressure at site of fistula at L elbow and also caused excessive forward lean for pt to self-propel w/c. OT contacted RN, MD, PT, and Case Manager to attempt to get replacement arm rests and to further problem solve issue with wheels. OT plans to return later in the day to follow-up on w/c issues once team has talked further about options. Pt's VSS on 2L continuous O2 through nasal cannula throughout session. Pt participated well in session and is making progress toward goals. Pt will benefit from continued acute skilled OT services to address deficits outlined below and increase safety and independence with functional tasks. Post acute discharge, pt will benefit from [facility] OT to maximize rehab potential.      If plan is discharge home,  recommend the following:  A little help with walking and/or transfers;A little help with bathing/dressing/bathroom;Assistance with cooking/housework;Assist for transportation;Help with stairs or ramp for entrance   Equipment Recommendations  Other (comment) (TBD- OT attempting to resolve positioning issues with new wheelchair)    Recommendations for Other Services      Precautions / Restrictions Precautions Precautions: Fall Recall of Precautions/Restrictions: Intact Precaution/Restrictions Comments: multiple chronic compression deformities in thoracic/lumbar spine, B rib fxs Restrictions Weight Bearing Restrictions Per Provider Order: No       Mobility Bed Mobility               General bed mobility comments: Pt sitting at EOB at beginning and end of session    Transfers Overall transfer level: Needs assistance Equipment used: None Transfers: Sit to/from Stand, Bed to chair/wheelchair/BSC Sit to Stand: Modified independent (Device/Increase time) Stand pivot transfers: Supervision         General transfer comment: Pt performed bed <-> w/c transfer x2     Balance Overall balance assessment: Mild deficits observed, not formally tested                                         ADL either performed or assessed with clinical judgement   ADL Overall ADL's : Needs assistance/impaired Eating/Feeding: Modified independent;Sitting   Grooming: Modified independent;Sitting           Upper Body Dressing : Modified independent;Contact guard assist Upper Body Dressing Details (indicate cue type and reason): largely Mod I with assist for managing telemetry cords     Toilet Transfer: Supervision/safety;Stand-pivot;BSC/3in1 (  without an AD)           Functional mobility during ADLs: Modified independent;Wheelchair General ADL Comments: Pt with noted improvements in activity tolerance this session    Extremity/Trunk Assessment Upper Extremity  Assessment Upper Extremity Assessment: Right hand dominant;RUE deficits/detail;LUE deficits/detail;Generalized weakness RUE Deficits / Details: gernealized weakness; edematous forearm and upper arm LUE Deficits / Details: gernealized weakness; edematous forearm and upper arm; new fistula at L elbow on 3/26   Lower Extremity Assessment Lower Extremity Assessment: Defer to PT evaluation        Vision       Perception     Praxis     Communication Communication Communication: No apparent difficulties   Cognition Arousal: Alert Behavior During Therapy: [facility] for tasks assessed/performed Cognition: No apparent impairments             OT - Cognition Comments: Pt AAOx4 and pleasant throughout session.                 Following commands: Intact        Cueing   Cueing Techniques: Verbal cues  Exercises      Shoulder Instructions       General Comments New wheelchair had been delivered to pt just prior to OT arrival. Upon transferring to new w/c, arm rests and posterior/anterior positioning of rear wheels found to not be appropriate for pt. OT contacted RN, MD, PT, and Case Manager to attempt to get replacement arm rests and to further problem solve issue with wheels. VSS on 2 Lcontinuous O2 through nasal cannula throughout session.    Pertinent Vitals/ Pain       Pain Assessment Pain Assessment: Faces Faces Pain Scale: Hurts little more Pain Location: generalized; L elbow at site of fistula  Pain Descriptors / Indicators: Aching, Discomfort, Grimacing Pain Intervention(s): Limited activity within patient's tolerance, Monitored during session, Repositioned, Patient requesting pain meds-RN notified  Home Living                                          Prior Functioning/Environment              Frequency  Min 2X/week        Progress Toward Goals  OT Goals(current goals can now be found in the care plan section)  Progress towards OT  goals: Progressing toward goals  Acute Rehab OT Goals Patient Stated Goal: to remain independent  Plan      Co-evaluation                 AM-PAC OT "6 Clicks" Daily Activity     Outcome Measure   Help from another person eating meals?: None Help from another person taking care of personal grooming?: None Help from another person toileting, which includes using toliet, bedpan, or urinal?: A Little Help from another person bathing (including washing, rinsing, drying)?: A Little Help from another person to put on and taking off regular upper body clothing?: A Little Help from another person to put on and taking off regular lower body clothing?: A Little 6 Click Score: 20    End of Session Equipment Utilized During Treatment: Oxygen;Other (comment);Gait belt (manual wheelchair)  OT Visit Diagnosis: Other abnormalities of gait and mobility (R26.89);Muscle weakness (generalized) (M62.81)   Activity Tolerance Patient tolerated treatment well   Patient Left in bed;with call bell/phone within reach (sitting EOB)  Nurse Communication Mobility status;Other (comment) (Pt sitting EOB; OT attempting to find solution for new wheelchair issues)        Time: 4010-2725 OT Time Calculation (min): 23 min  Charges: OT General Charges $OT Visit: 1 Visit OT Treatments $Self Care/Home Management : 8-22 mins $Wheelchair Management: 8-22 mins  Ladonna Vanorder "Orson Eva., OTR/L, MA Acute Rehab [phone]   [name] 06/17/2023, 6:48 PM

## 2023-06-17 NOTE — Discharge Planning (Signed)
 [facility]  Initial Hemodialysis Orders  Dialysis center: [facility]  Patient's name: Jerelle Virden DOB: 21-Mar-1971 AKI or ESRD: ESRD  Past Medical History: Osteogenesis imperfecta- wheelchair bound, OSA, previous systolic dysfunction ( in 2023 EF 30-35%) as well as progressive ckd since 2021. CHF; HTN, T2DM  Discharge diagnosis: New ESRD - HD initiated Respiratory failure - on 2L O2 as baseline Chronic systolic CHF - Echo with EF 35 to 40%, RV systolic function preserved. Moderate mitral valve regurgitation. With wall motion abnormalities X 3 years   Allergies:  Allergies  Allergen Reactions   Fish Allergy Anaphylaxis   Other Anaphylaxis and Other (See Comments)    NO NUTS!!!!!! Peanut are legumes!!   "No red meat- Does not eat"   Peanuts [Peanut Oil] Anaphylaxis   Shellfish Allergy Anaphylaxis   Beef-Derived Drug Products    Fish-Derived Products    Latex Itching   Pork-Derived Products Other (See Comments)    Does not eat- religious reasons    Date of First Dialysis: CRRT started on 3/16 to correct hyperkalemia. Appears transitioned to IHD on 3/24 Cause of renal disease: Likely CHF. Noted CKD has been progressing in outpatient.  Dialysis Prescription: Dialysis Frequency: MWF Tx duration: 3.5 then 4hrs  BFR: 400  DFR: 800 EDW: 96kg  Dialyzer: 180NRe UF profile/Sodium modeling?: None Dialysis Bath: 2 K 2 Ca  Dialysis access: RIJ TDC placed 3/18 by IR  L AVF placed 3/26 by Dr. Chestine Spore  In Center Medications: Heparin Dose: 3000 units bolus. Noted allergy to pork but he's been receiving this here.  VDRA: Calcitriol - per protocol Venofer: per protocol Aranesp given on 3/23. Change to Micera every 2 weeks. Next dose due: 06/21/23  Discharge labs: Hgb: 8.3  K+: 4.4  Ca: 8.0  Phos: 7.4  Alb: 1.8  Please draw routine labs. Additional labs needed: Per routine facility protocol  Salome Holmes, NP

## 2023-06-17 NOTE — Care Management Important Message (Signed)
 Important Message  Patient Details  Name: Jerry Edwards MRN: [id number] Date of Birth: 11/11/1970   Important Message Given:  Yes - Medicare IM     Renie Ora 06/17/2023, 8:24 AM

## 2023-06-17 NOTE — TOC Progression Note (Addendum)
 Transition of Care [facility]) - Progression Note    Patient Details  Name: Jerry Edwards MRN: [id number] Date of Birth: 1970-09-19  [facility] of [facility] CM/SW Contact  Michaela Corner, [place] Phone Number: 06/17/2023, 3:32 PM  Clinical Narrative:   CSW spoke with patient about medicaid transportation benefit and per patient he is aware of his transportation benefit. Patient stated that he has to call Medicaid 48 hrs in advance to set up transportation. RNCM notified treatment team on CSW's behalf stating "per Jesse Sans, he has transportation for Monday for HD, if he can get his HD her tomorrow then dc that would be great ... so he is good just need diaylsis here tomorrow, then dc after that." CSW provided patient with transportation resources and Medicaid transportation number.   TOC will continue to follow.    Expected Discharge Plan: Home/Self Care Barriers to Discharge: Continued Medical Work up  Expected Discharge Plan and Services   Discharge Planning Services: CM Consult   Living arrangements for the past 2 months: Single Family Home Expected Discharge Date: 06/17/23                                     Social Determinants of Health (SDOH) Interventions SDOH Screenings   Food Insecurity: No Food Insecurity (06/06/2023)  Housing: Low Risk  (06/06/2023)  Transportation Needs: No Transportation Needs (06/06/2023)  Utilities: Not At Risk (06/06/2023)  Depression (PHQ2-9): Low Risk  (05/24/2019)  Physical Activity: Insufficiently Active (03/27/2022)  Social Connections: Patient Declined (06/06/2023)  Tobacco Use: Medium Risk (06/06/2023)    Readmission Risk Interventions     No data to display

## 2023-06-17 NOTE — Progress Notes (Signed)
 [facility] Progress Note  Name: Jerry Edwards MRN: [id number] DOB: [birth year]   Subjective:  He had 150 mL uop over 3/26.  Last HD on 3/26 with 3.4 kg UF.  He was not able to set up transportation for HD tomorrow and asks if he can get HD here.  Updated his mother at bedside.    Review of systems:      Denies shortness of breath or chest pain  Denies n/v  ------------------- Background on consult:  Jerry Edwards is an 53 y.o. male with osteogenesis imperfecta- wheelchair bound, OSA, previous systolic dysfunction ( in 2023 EF 30-35%) as well as progressive ckd since 2021- most recently crt over 4. He sees Dr. Gala Romney but last seen in October of 2024- never seen a kidney MD. Now p/w SOB- had had his lasix dose decreased several months ago probably in response to elevated crt. He was noted to be on Jardiance as an OP as well as lasix and potassium. Labs returned a crt of 13 but also potassium of over 7.5. Pt felt poorly for weeks w/ later dec UOP      Intake/Output Summary (Last 24 hours) at 06/17/2023 1637 Last data filed at 06/17/2023 1100 Gross per 24 hour  Intake 110 ml  Output 3550 ml  Net -3440 ml    Vitals:  Vitals:   06/17/23 0500 06/17/23 0822 06/17/23 1155 06/17/23 1509  BP:  123/82 134/79 130/81  Pulse:  76 71 77  Resp: 15 19 (!) 21 18  Temp:  98.1 F (36.7 C) 98.1 F (36.7 C) 98.6 F (37 C)  TempSrc:  Oral Oral Oral  SpO2:    98%  Weight:      Height:         Physical Exam:     General adult male in bed in no acute distress; stature consistent with OI HEENT normocephalic atraumatic extraocular movements intact sclera anicteric Neck supple trachea midline Lungs clear to auscultation bilaterally normal work of breathing at rest on room air Heart S1S2 no rub Abdomen distended soft nontender  Extremities trace edema  Psych normal mood and affect Neuro - alert and oriented x 3 provides hx and follows commands Access: RIJ Tunneled catheter in  place; LUE AVF with bruit and thrill   Medications reviewed   Labs:     Latest Ref Rng & Units 06/17/2023    3:00 AM 06/16/2023    2:47 AM 06/15/2023    3:12 AM  BMP  Glucose 70 - 99 mg/dL 409  79  72   BUN 6 - 20 mg/dL 39  61  41   Creatinine 0.61 - 1.24 mg/dL 8.11  9.14  7.82   Sodium 135 - 145 mmol/L 134  136  134   Potassium 3.5 - 5.1 mmol/L 4.4  4.7  4.7   Chloride 98 - 111 mmol/L 96  99  100   CO2 22 - 32 mmol/L 29  24  27    Calcium 8.9 - 10.3 mg/dL 8.0  8.2  8.0      Assessment/Plan:   1. New ESRD-  progressive CKD as outpatient not previously followed by nephrology per charting.  Most recent GFR as OP in November was 14.   Presented with volume overload, hyperkalemia, and acidosis.  CRRT started on 3/16 mostly to correct hyperkalemia.  Note VT evening of 3/16; CRRT clotted off 3/18 AM.  S/p RIJ TC by VIR on 3/18.  Appreciate VVS - he had a left  brachiocephalic AVF placed with Dr. Chestine Spore on 3/26 --------  - Continue HD per MWF schedule.  He is CLIP'd to [facility] on MWF schedule - Asked staff to reach out about future transportation to ensure smooth transition to outpatient HD   2. Hypertension/volume  - fluid overload improved with HD.  Intermittent lasix and would give lasix 80 mg daily on non-HD days on discharge   3. Hyperkalemia - improved with RRT   4. Anemia of CKD -  on aranesp 150 mcg every Sunday.  Iron sats fine.     5.  Renal osteodystrophy- increased Renvela to 2 tablets with meals and on HD  Disposition - He has requested HD here tomorrow due to transportation issue tomorrow.    Estanislado Emms, MD 06/17/2023 4:56 PM

## 2023-06-17 NOTE — Progress Notes (Signed)
 Occupational Therapy Treatment Patient Details Name: Jerry Edwards MRN: [id number] DOB: 1970/09/05 Today's Date: 06/17/2023   History of present illness 53 y.o. male presents to [facility] 06/06/23 with SOB, swelling of hands and feet, and decreased urine output.Pt with acute renal failure, small B effusions, multiple compression deformities of lower thoracic and lumbar spine (no acute fx), multiple remote B rib fxs, and cardiomegaly with pulm vascular congestion. 3/16-3/18 CRRT. Left brachiocephalic arteriovenous fistula placement on 3/26. PMHx: osteogenesis imperfecta, HFrEF, CKD stage 4, pulmonary HTN, obesity   OT comments  OT returned to reassess wheelchair positioning following delivering of an alternate new manual wheelchair. This new w/c is equipped with half-arm rests and the rear wheels are positioned further toward the front of the wheelchair. Pt with noted improvements in positioning in this w/c with no pressure on L elbow fistula site from arm rest and with pt with improved upright posture during functional mobility with w/c. This w/c also equipped with removable rigid back support. OT removed this support at pt's request with pt positioning in w/c continuing to be appropriate and pt reporting improved comfort with support removed. Pt reports this w/c is more comfortable and easier to self-propel than the one trialed earlier this day and he believe it will work well for him. OT further educated pt on availability of shorter rubber brake handles and alternate styles of arm rests should pt need/desire them in the future with pt verbalizing understanding. OT also provided pt with a gait belt for home use to improve safety with functional transfers with assistance of family with pt verbalizing and demonstrating understanding of use through teach back. Pt currently demonstrates ability to complete functional stand-pivot transfers with Supervision for safety and self-propels w/c with Mod I. Pt participated  well in session and is making progress toward goals. Pt will benefit from continued acute skilled OT services to address deficits outlined below and increase safety and independence with functional tasks. Post acute discharge, pt will benefit from [facility] OT to maximize rehab potential.       If plan is discharge home, recommend the following:  A little help with walking and/or transfers;A little help with bathing/dressing/bathroom;Assistance with cooking/housework;Assist for transportation;Help with stairs or ramp for entrance   Equipment Recommendations  Other (comment) (Gait belt provided this session)    Recommendations for Other Services      Precautions / Restrictions Precautions Precautions: Fall Recall of Precautions/Restrictions: Intact Precaution/Restrictions Comments: multiple chronic compression deformities in thoracic/lumbar spine, B rib fxs Restrictions Weight Bearing Restrictions Per Provider Order: No       Mobility Bed Mobility               General bed mobility comments: Pt sitting at EOB at beginning and end of session    Transfers Overall transfer level: Needs assistance Equipment used: None Transfers: Sit to/from Stand, Bed to chair/wheelchair/BSC Sit to Stand: Modified independent (Device/Increase time) Stand pivot transfers: Supervision         General transfer comment: Pt performed bed <-> w/c transfer x2. OT provided pt with gait belt for home use and instructed pt in use to increase safety with transfers in the home with assist of family. Pt verbalized and demonstrated understanding of education through teach back.     Balance Overall balance assessment: Mild deficits observed, not formally tested  ADL either performed or assessed with clinical judgement   ADL Overall ADL's : Needs assistance/impaired Eating/Feeding: Modified independent;Sitting   Grooming: Modified  independent;Sitting           Upper Body Dressing : Modified independent;Contact guard assist Upper Body Dressing Details (indicate cue type and reason): largely Mod I with assist for managing telemetry cords     Toilet Transfer: Supervision/safety;Stand-pivot;BSC/3in1 (without an AD)           Functional mobility during ADLs: Modified independent;Wheelchair General ADL Comments: ADLs not addressed this session    Extremity/Trunk Assessment Upper Extremity Assessment Upper Extremity Assessment: Right hand dominant RUE Deficits / Details: gernealized weakness; edematous forearm and upper arm LUE Deficits / Details: gernealized weakness; edematous forearm and upper arm; new fistula placement at L elbow on 3/26   Lower Extremity Assessment Lower Extremity Assessment: Defer to PT evaluation        Vision       Perception     Praxis     Communication Communication Communication: No apparent difficulties   Cognition Arousal: Alert Behavior During Therapy: [facility] for tasks assessed/performed Cognition: No apparent impairments             OT - Cognition Comments: Pt AAOx4 and pleasant throughout session.                 Following commands: Intact        Cueing   Cueing Techniques: Verbal cues  Exercises      Shoulder Instructions       General Comments New wheelchair had been delivered to pt just prior to OT arrival. Upon transferring to new w/c, arm rests and posterior/anterior positioning of rear wheels found to not be appropriate for pt. OT contacted RN, MD, PT, and Case Manager to attempt to get replacement arm rests and to further problem solve issue with wheels. VSS on 2 Lcontinuous O2 through nasal cannula throughout session.    Pertinent Vitals/ Pain       Pain Assessment Pain Assessment: Faces Faces Pain Scale: Hurts little more Pain Location: generalized; L elbow at site of fistula Pain Descriptors / Indicators: Aching, Discomfort,  Grimacing Pain Intervention(s): Limited activity within patient's tolerance, Monitored during session, Repositioned, Premedicated before session  Home Living                                          Prior Functioning/Environment              Frequency  Min 2X/week        Progress Toward Goals  OT Goals(current goals can now be found in the care plan section)  Progress towards OT goals: Progressing toward goals  Acute Rehab OT Goals Patient Stated Goal: to remain independent  Plan      Co-evaluation                 AM-PAC OT "6 Clicks" Daily Activity     Outcome Measure   Help from another person eating meals?: None Help from another person taking care of personal grooming?: None Help from another person toileting, which includes using toliet, bedpan, or urinal?: A Little Help from another person bathing (including washing, rinsing, drying)?: A Little Help from another person to put on and taking off regular upper body clothing?: A Little Help from another person to put on and taking off regular  lower body clothing?: A Little 6 Click Score: 20    End of Session Equipment Utilized During Treatment: Gait belt;Oxygen;Other (comment) (manual w/c)  OT Visit Diagnosis: Other abnormalities of gait and mobility (R26.89);Muscle weakness (generalized) (M62.81)   Activity Tolerance Patient tolerated treatment well   Patient Left in bed;with call bell/phone within reach (sitting ROB)   Nurse Communication Mobility status;Other (comment) (pt with improved comfort, positioning, and function in newly delivered w/c)        Time: 5621-3086 OT Time Calculation (min): 26 min  Charges: OT General Charges $OT Visit: 1 Visit OT Treatments $Self Care/Home Management : 8-22 mins $Wheelchair Management: 8-22 mins  Jerry Edwards "Jerry Edwards., OTR/L, MA Acute Rehab [phone]   [name] 06/17/2023, 7:09 PM

## 2023-06-17 NOTE — Progress Notes (Addendum)
  Progress Note    06/17/2023 7:37 AM 1 Day Post-Op  Subjective:  some incisional soreness in left AC. Able to move left arm without issues. There was some post operative concern because of delayed numbness/ weakness after nerve block but now resolved. Denies any pain, weakness or numbness in left hand   Vitals:   06/17/23 0422 06/17/23 0500  BP:    Pulse: 71   Resp: 16 15  Temp:    SpO2: 98%    Physical Exam: Cardiac:  regular Lungs:  non labored Incisions:  left AC incision is clean, dry and intact without swelling or hematoma. Tender to palpation Extremities: 2+ left radial pulse. Hand warm and well perfused. 5/5 grip strength. Left AV fistula with good thrill Neurologic: alert and oriented  CBC    Component Value Date/Time   WBC 7.0 06/16/2023 0247   RBC 2.88 (L) 06/16/2023 0247   HGB 8.3 (L) 06/16/2023 0247   HCT 28.4 (L) 06/16/2023 0247   PLT 196 06/16/2023 0247   MCV 98.6 06/16/2023 0247   MCH 28.8 06/16/2023 0247   MCHC 29.2 (L) 06/16/2023 0247   RDW 14.5 06/16/2023 0247   LYMPHSABS 1.4 02/02/2022 1857   MONOABS 1.3 (H) 02/02/2022 1857   EOSABS 0.1 02/02/2022 1857   BASOSABS 0.0 02/02/2022 1857    BMET    Component Value Date/Time   NA 134 (L) 06/17/2023 0300   K 4.4 06/17/2023 0300   CL 96 (L) 06/17/2023 0300   CO2 29 06/17/2023 0300   GLUCOSE 111 (H) 06/17/2023 0300   BUN 39 (H) 06/17/2023 0300   CREATININE 5.41 (H) 06/17/2023 0300   CALCIUM 8.0 (L) 06/17/2023 0300   GFRNONAA 12 (L) 06/17/2023 0300   GFRAA >60 06/28/2019 1523    INR    Component Value Date/Time   INR 1.0 02/08/2021 1754     Intake/Output Summary (Last 24 hours) at 06/17/2023 0737 Last data filed at 06/16/2023 1834 Gross per 24 hour  Intake 320 ml  Output 3600 ml  Net -3280 ml     Assessment/Plan:  53 y.o. male is s/p left AV fistula creation 1 Day Post-Op   Left AC incision c/d/I without swelling or hematoma AV fistula with palpable thrill No signs or symptoms of  steal Okay for d/c from vascular standpoint Follow up arranged in 6 weeks with fistula duplex   Graceann Congress, PA-C [facility] [phone] 06/17/2023 7:37 AM  VASCULAR STAFF ADDENDUM: I have independently interviewed and examined the patient. I agree with the above.    Victorino Sparrow MD [facility] Phone Number: [phone] 06/17/2023 8:23 AM

## 2023-06-17 NOTE — Discharge Summary (Addendum)
 Physician Discharge Summary  Jerry Edwards ZOX:[id number] DOB: Jan 11, 1971 DOA: 06/06/2023  PCP: Dolores Patty, MD  Admit date: 06/06/2023 Discharge date: 06/17/2023  Time spent: 45 minutes  Recommendations for Outpatient Follow-up:  Vascular surgery in 4-6 weeks for fistula check Outpatient hemodialysis at [facility] Monday Wednesday Friday   Discharge Diagnoses:  Principal Problem:   Acute kidney injury superimposed on chronic kidney disease [facility]) Active Problems:   Acute on chronic systolic heart failure (HCC)   Essential hypertension   Type 2 diabetes mellitus (HCC)   Osteogenesis imperfecta   Class 3 obesity   Discharge Condition: Improved  Diet recommendation: Low-sodium, heart healthy  Filed Weights   06/16/23 0549 06/16/23 1500 06/17/23 0425  Weight: 99 kg 99 kg 97.4 kg    History of present illness:  53/M w osteogenesis imperfecta- wheelchair bound, OSA, previous systolic dysfunction ( in 2023 EF 30-35%), CKD 4, presented w SOB- lasix dose decreased several months ago probably in response to elevated crt. Labs returned a crt of 13 but also potassium of over 7.5, CXR w/ CHF Patient was placed on CRRT for severe acidosis and hyperkalemia.  3/18 tunneled HD catheter.  3/19 transitioned to iHD.  3/20 HD scheduled MWF,   Hospital Course:   AKI on CKD4, now ESRD Hyponatremia.  -Admitted with volume overload with a creatinine of 13 and potassium over 7.5 -s/p tunneled HD cath and started HD -3/26 underwent left arm AV fistula placement HD scheduled MWF, continue Lasix 80 Mg daily on nondialysis days -Clip for HD completed he will start HD as outpatient on Monday -Discharged home in a stable condition   Chronic hypoxic respiratory failure - on 2 L O2 at baseline   Anemia of chronic renal disease. Continue with EPO.    Metabolic bone disease continue  sevelamer.    Acute on chronic systolic heart failure (HCC) -Echo with EF 35 to 40%, RV systolic  function preserved. Moderate mitral valve regurgitation.  With wall motion abnormalities X 3 years -Volume status has improved with ultrafiltration.  -HD as above  -Follow-up with cardiology, now that he has started HD could consider ischemic eval   Essential hypertension -Now improved and stable Ultrafiltration on HD    Type 2 diabetes mellitus (HCC) -off sliding scale   Diet controlled  Osteogenesis imperfecta Continue PT and OT Noted old compression fractures.  Pain control with tramadol.  Wheelchair-bound at baseline   Class 3 obesity Calculated BMI is 43.3     Discharge Exam: Vitals:   06/17/23 0500 06/17/23 0822  BP:  123/82  Pulse:  76  Resp: 15 19  Temp:  98.1 F (36.7 C)  SpO2:     Gen: Awake, Alert, Oriented X 3,  HEENT: no JVD, right upper eyelid everted, tunneled HD cath Lungs: Good air movement bilaterally, CTAB CVS: S1S2/RRR Abd: soft, Non tender, non distended, BS present Extremities: deformities of both lower extremities, trace edema, left arm AV fistula Skin: no new rashes on exposed skin    Discharge Instructions    Allergies as of 06/17/2023       Reactions   Fish Allergy Anaphylaxis   Other Anaphylaxis, Other (See Comments)   NO NUTS!!!!!! Peanut are legumes!!  "No red meat- Does not eat"   Peanuts [peanut Oil] Anaphylaxis   Shellfish Allergy Anaphylaxis   Beef-derived Drug Products    Fish-derived Products    Latex Itching   Pork-derived Products Other (See Comments)   Does not eat- religious reasons  Medication List     STOP taking these medications    azithromycin 250 MG tablet Commonly known as: Zithromax Z-Pak   benzonatate 100 MG capsule Commonly known as: TESSALON   carvedilol 12.5 MG tablet Commonly known as: COREG   doxazosin 2 MG tablet Commonly known as: CARDURA   erythromycin ophthalmic ointment   hydrALAZINE 100 MG tablet Commonly known as: APRESOLINE   isosorbide mononitrate 30 MG 24 hr  tablet Commonly known as: IMDUR   Jardiance 10 MG Tabs tablet Generic drug: empagliflozin   potassium chloride SA 20 MEQ tablet Commonly known as: KLOR-CON M       TAKE these medications    aspirin EC 81 MG tablet Take 1 tablet (81 mg total) by mouth daily.   furosemide 80 MG tablet Commonly known as: LASIX Take 1 tablet (80 mg total) by mouth every Tuesday, Thursday, Saturday, and Sunday. Take 80 mg in and 40 mg in pm What changed:  how much to take how to take this when to take this   OXYGEN Inhale 2 L into the lungs as needed (shortness of breath).   PATADAY OP Place 1 drop into both eyes in the morning and at bedtime.   polyethylene glycol 17 g packet Commonly known as: MIRALAX / GLYCOLAX Take 17 g by mouth daily as needed for moderate constipation.   sevelamer carbonate 800 MG tablet Commonly known as: RENVELA Take 2 tablets (1,600 mg total) by mouth 3 (three) times daily with meals.   traMADol 50 MG tablet Commonly known as: ULTRAM Take 1 tablet (50 mg total) by mouth every 8 (eight) hours as needed for severe pain (pain score 7-10).               Durable Medical Equipment  (From admission, onward)           Start     Ordered   06/14/23 1719  For home use only DME lightweight manual wheelchair with seat cushion  Once       Comments: Patient suffers from osteogenesis imperfecta, brittle bone disease which impairs their ability to perform daily activities like bathing, dressing, feeding, grooming, and toileting in the home.  A cane, crutch, or walker will not resolve  issue with performing activities of daily living. A wheelchair will allow patient to safely perform daily activities. Patient is not able to propel themselves in the home using a standard weight wheelchair due to arm weakness, endurance, and general weakness. Patient can self propel in the lightweight wheelchair. Length of need Lifetime. Accessories: elevating leg rests (ELRs), wheel  locks, extensions and anti-tippers, back cushion  Height 4'11" Weight 96.3 kg   06/14/23 1724           Allergies  Allergen Reactions   Fish Allergy Anaphylaxis   Other Anaphylaxis and Other (See Comments)    NO NUTS!!!!!! Peanut are legumes!!   "No red meat- Does not eat"   Peanuts [Peanut Oil] Anaphylaxis   Shellfish Allergy Anaphylaxis   Beef-Derived Drug Products    Fish-Derived Products    Latex Itching   Pork-Derived Products Other (See Comments)    Does not eat- religious reasons    Follow-up Information     [facility] Follow up.   Specialty: Home Health Services Why: Agency will call you to set up apt times Contact information: [address] [place] [address] [phone]         [facility]  at [facility] Follow up in 6 week(s).   Specialty: Vascular Surgery Why: Office will call you to arrange your appt (sent). Contact information: [address] [place] [address] [phone]        [facility] Go on 06/18/2023.   Why: Schedule is Monday, Wednesday, Friday with 10:15 am chair time.  On Friday, please arrive at 9:30 am to complete paperwork prior to treatment. Contact information: [address] [place] [address] [phone]                  The results of significant diagnostics from this hospitalization (including imaging, microbiology, ancillary and laboratory) are listed below for reference.    Significant Diagnostic Studies: VAS [place] UPPER EXT VEIN MAPPING (PRE-OP AVF) Result Date: 06/15/2023 UPPER EXTREMITY VEIN MAPPING Patient Name:  Jerry Edwards  Date of Exam:   06/15/2023 Medical Rec #: [id number]      Accession #:    [id number] Date of Birth: 06-Nov-1970      Patient Gender: M Patient Age:   2 years Exam Location:  [facility] Procedure:      VAS [place] UPPER EXT VEIN MAPPING (PRE-OP AVF) Referring Phys: Vallery Sa  --------------------------------------------------------------------------------  Indications: Pre-access. History: ESRD, CHF, swelling of hands and feet, HTN.  Comparison Study: No prior exam. Performing Technologist: Fernande Bras  Examination Guidelines: A complete evaluation includes B-mode imaging, spectral Doppler, color Doppler, and power Doppler as needed of all accessible portions of each vessel. Bilateral testing is considered an integral part of a complete examination. Limited examinations for reoccurring indications may be performed as noted. +-----------------+-------------+----------+---------+ Right Cephalic   Diameter (cm)Depth (cm)Findings  +-----------------+-------------+----------+---------+ Shoulder             0.49        0.46             +-----------------+-------------+----------+---------+ Prox upper arm       0.35        0.40             +-----------------+-------------+----------+---------+ Mid upper arm        0.45        0.41             +-----------------+-------------+----------+---------+ Dist upper arm       0.57        0.30   branching +-----------------+-------------+----------+---------+ Antecubital fossa    0.86        0.24   IV line.  +-----------------+-------------+----------+---------+ Prox forearm         0.54        0.70             +-----------------+-------------+----------+---------+ Mid forearm          0.47        0.50   branching +-----------------+-------------+----------+---------+ Dist forearm         0.38        0.20   branching +-----------------+-------------+----------+---------+ Wrist                0.36        0.22             +-----------------+-------------+----------+---------+ +--------------+-------------+----------+---------+ Right Basilic Diameter (cm)Depth (cm)Findings  +--------------+-------------+----------+---------+ Mid upper arm     0.37                          +--------------+-------------+----------+---------+ Dist upper arm    0.36  branching +--------------+-------------+----------+---------+ Prox forearm      0.38                         +--------------+-------------+----------+---------+ Mid forearm       0.35                         +--------------+-------------+----------+---------+ Distal forearm    0.24                         +--------------+-------------+----------+---------+ Elbow             0.28                         +--------------+-------------+----------+---------+ Wrist             0.24                         +--------------+-------------+----------+---------+ +-----------------+-------------+----------+---------+ Left Cephalic    Diameter (cm)Depth (cm)Findings  +-----------------+-------------+----------+---------+ Shoulder             0.42        0.49             +-----------------+-------------+----------+---------+ Prox upper arm       0.39        0.33   branching +-----------------+-------------+----------+---------+ Mid upper arm        0.39        0.40             +-----------------+-------------+----------+---------+ Dist upper arm       0.40        0.34             +-----------------+-------------+----------+---------+ Antecubital fossa    0.40        0.20   branching +-----------------+-------------+----------+---------+ Prox forearm         0.45        0.47             +-----------------+-------------+----------+---------+ Mid forearm          0.45        0.36             +-----------------+-------------+----------+---------+ Dist forearm         0.32        0.16   branching +-----------------+-------------+----------+---------+ Wrist                0.32        0.18             +-----------------+-------------+----------+---------+ +--------------+-------------+----------+---------+ Left Basilic  Diameter (cm)Depth (cm)Findings   +--------------+-------------+----------+---------+ Mid upper arm     0.33                         +--------------+-------------+----------+---------+ Dist upper arm    0.40                         +--------------+-------------+----------+---------+ Prox forearm      0.23                         +--------------+-------------+----------+---------+ Mid forearm       0.29                         +--------------+-------------+----------+---------+ Distal forearm  0.23               branching +--------------+-------------+----------+---------+ Elbow             0.33               branching +--------------+-------------+----------+---------+ Wrist             0.21                         +--------------+-------------+----------+---------+ *See table(s) above for measurements and observations.  Diagnosing physician: Carolynn Sayers Electronically signed by Carolynn Sayers on 06/15/2023 at 12:18:14 PM.    Final    ECHOCARDIOGRAM LIMITED Result Date: 06/10/2023    ECHOCARDIOGRAM LIMITED REPORT   Patient Name:   Jerry Edwards Date of Exam: 06/10/2023 Medical Rec #:  [id number]     Height:       59.0 in Accession #:    [id number]    Weight:       217.4 lb Date of Birth:  Aug 25, 1970     BSA:          1.911 m Patient Age:    53 years      BP:           107/63 mmHg Patient Gender: M             HR:           89 bpm. Exam Location:  Inpatient Procedure: Limited Echo, Color Doppler and Cardiac Doppler (Both Spectral and            Color Flow Doppler were utilized during procedure). Indications:    I50.21 Acute systolic (congestive) heart failure  History:        Patient has prior history of Echocardiogram examinations, most                 recent 06/06/2023. Risk Factors:Hypertension, Sleep Apnea and                 Diabetes.  Sonographer:    Irving Burton Senior RDCS Referring Phys: [id number] ALMA L DIAZ  Sonographer Comments: Limited for EF eval IMPRESSIONS  1. Limited Echocardiogram - refer to full echo  dated 06/06/2023.  2. Left ventricular ejection fraction, by estimation, is 30 to 35%. The left ventricle has moderately decreased function.  3. The mitral valve is grossly normal. Moderate mitral valve regurgitation. No evidence of mitral stenosis. Comparison(s): No significant change in LVEF. FINDINGS  Left Ventricle: Left ventricular ejection fraction, by estimation, is 30 to 35%. The left ventricle has moderately decreased function.  LV Wall Scoring: The entire apex is akinetic. The anterior wall, antero-lateral wall, anterior septum, inferior wall, posterior wall, mid inferoseptal segment, and basal inferoseptal segment are hypokinetic. Mitral Valve: The mitral valve is grossly normal. Moderate mitral valve regurgitation. No evidence of mitral valve stenosis. Additional Comments: Spectral Doppler performed. Color Doppler performed.   LV Volumes (MOD) LV vol d, MOD A2C: 249.0 ml LV vol d, MOD A4C: 234.0 ml LV vol s, MOD A2C: 152.0 ml LV vol s, MOD A4C: 153.0 ml LV SV MOD A2C:     97.0 ml LV SV MOD A4C:     234.0 ml LV SV MOD BP:      89.3 ml AORTIC VALVE LVOT Vmax:   65.70 cm/s LVOT Vmean:  46.600 cm/s LVOT VTI:    0.114 m MR Peak grad:    86.9 mmHg MR Vmax:  466.00 cm/s  SHUNTS MR PISA:         3.08 cm     Systemic VTI: 0.11 m MR PISA Eff ROA: 26 mm MR PISA Radius:  0.70 cm Sunit Tolia Electronically signed by Tessa Lerner Signature Date/Time: 06/10/2023/11:57:50 AM    Final    IR Fluoro Guide CV Line Right Result Date: 06/08/2023 INDICATION: 201257 ESRD (end stage renal disease) (HCC) [id number] EXAM: TUNNELED CENTRAL VENOUS HEMODIALYSIS CATHETER PLACEMENT WITH ULTRASOUND AND FLUOROSCOPIC GUIDANCE MEDICATIONS: Ancef 2 gm IV. The antibiotic was given in an appropriate time interval prior to skin puncture. ANESTHESIA/SEDATION: Moderate (conscious) sedation was employed during this procedure. A total of Versed 1.5 mg and Fentanyl 50 mcg was administered intravenously. Moderate Sedation Time: 14 minutes. The  patient's level of consciousness and vital signs were monitored continuously by radiology nursing throughout the procedure under my direct supervision. FLUOROSCOPY TIME:  Fluoroscopic dose; 5 mGy COMPLICATIONS: None immediate. PROCEDURE: Informed written consent was obtained from the patient after a discussion of the risks, benefits, and alternatives to treatment. Questions regarding the procedure were encouraged and answered. The RIGHT neck and chest were prepped with chlorhexidine in a sterile fashion, and a sterile drape was applied covering the operative field. Maximum barrier sterile technique with sterile gowns and gloves were used for the procedure. A timeout was performed prior to the initiation of the procedure. After creating a small venotomy incision, a micropuncture kit was utilized to access the internal jugular vein. Real-time ultrasound guidance was utilized for vascular access including the acquisition of a permanent ultrasound image documenting patency of the accessed vessel. The microwire was utilized to measure appropriate catheter length. A stiff Glidewire was advanced to the level of the IVC and the micropuncture sheath was exchanged for a peel-away sheath. A palindrome tunneled hemodialysis catheter measuring 19 cm from tip to cuff was tunneled in a retrograde fashion from the anterior chest wall to the venotomy incision. The catheter was then placed through the peel-away sheath with tips ultimately positioned within the superior aspect of the right atrium. Final catheter positioning was confirmed and documented with a spot radiographic image. The catheter aspirates and flushes normally. The catheter was flushed with appropriate volume heparin dwells. The catheter exit site was secured with a 2-0 Ethilon retention suture. The venotomy incision was closed with Dermabond. Dressings were applied. The patient tolerated the procedure well without immediate post procedural complication. IMPRESSION:  Successful placement of 19 cm tip to cuff tunneled hemodialysis catheter via the RIGHT internal jugular vein. The tip of the catheter is positioned at the superior cavo-atrial junction. The catheter is ready for immediate use. Roanna Banning, MD [facility] Electronically Signed   By: Roanna Banning M.D.   On: 06/08/2023 16:13   IR US Guide Vasc Access Right Result Date: 06/08/2023 INDICATION: [id number] ESRD (end stage renal disease) (HCC) 201257 EXAM: TUNNELED CENTRAL VENOUS HEMODIALYSIS CATHETER PLACEMENT WITH ULTRASOUND AND FLUOROSCOPIC GUIDANCE MEDICATIONS: Ancef 2 gm IV. The antibiotic was given in an appropriate time interval prior to skin puncture. ANESTHESIA/SEDATION: Moderate (conscious) sedation was employed during this procedure. A total of Versed 1.5 mg and Fentanyl 50 mcg was administered intravenously. Moderate Sedation Time: 14 minutes. The patient's level of consciousness and vital signs were monitored continuously by radiology nursing throughout the procedure under my direct supervision. FLUOROSCOPY TIME:  Fluoroscopic dose; 5 mGy COMPLICATIONS: None immediate. PROCEDURE: Informed written consent was obtained from the patient after a discussion of the risks, benefits,  and alternatives to treatment. Questions regarding the procedure were encouraged and answered. The RIGHT neck and chest were prepped with chlorhexidine in a sterile fashion, and a sterile drape was applied covering the operative field. Maximum barrier sterile technique with sterile gowns and gloves were used for the procedure. A timeout was performed prior to the initiation of the procedure. After creating a small venotomy incision, a micropuncture kit was utilized to access the internal jugular vein. Real-time ultrasound guidance was utilized for vascular access including the acquisition of a permanent ultrasound image documenting patency of the accessed vessel. The microwire was  utilized to measure appropriate catheter length. A stiff Glidewire was advanced to the level of the IVC and the micropuncture sheath was exchanged for a peel-away sheath. A palindrome tunneled hemodialysis catheter measuring 19 cm from tip to cuff was tunneled in a retrograde fashion from the anterior chest wall to the venotomy incision. The catheter was then placed through the peel-away sheath with tips ultimately positioned within the superior aspect of the right atrium. Final catheter positioning was confirmed and documented with a spot radiographic image. The catheter aspirates and flushes normally. The catheter was flushed with appropriate volume heparin dwells. The catheter exit site was secured with a 2-0 Ethilon retention suture. The venotomy incision was closed with Dermabond. Dressings were applied. The patient tolerated the procedure well without immediate post procedural complication. IMPRESSION: Successful placement of 19 cm tip to cuff tunneled hemodialysis catheter via the RIGHT internal jugular vein. The tip of the catheter is positioned at the superior cavo-atrial junction. The catheter is ready for immediate use. Roanna Banning, MD [facility] Electronically Signed   By: Roanna Banning M.D.   On: 06/08/2023 16:13   ECHOCARDIOGRAM COMPLETE Result Date: 06/06/2023    ECHOCARDIOGRAM REPORT   Patient Name:   Jerry Edwards Date of Exam: 06/06/2023 Medical Rec #:  [id number]     Height:       59.0 in Accession #:    [id number]    Weight:       224.0 lb Date of Birth:  [birth year]     BSA:          1.935 m Patient Age:    53 years      BP:           124/76 mmHg Patient Gender: M             HR:           74 bpm. Exam Location:  Inpatient Procedure: 2D Echo, Color Doppler, Cardiac Doppler and Intracardiac            Opacification Agent (Both Spectral and Color Flow Doppler were            utilized during procedure). Indications:    I50.21 Acute systolic  (congestive) heart failure  History:        Patient has prior history of Echocardiogram examinations, most                 recent 12/16/2021. Pulmonary HTN; Risk Factors:Hypertension,                 Diabetes and Sleep Apnea.  Sonographer:    Irving Burton Senior RDCS Referring Phys: [phone] Eliot Ford [facility]  Sonographer Comments: No parasternal window due to body habitus and positioning IMPRESSIONS  1. The entire left ventricular apex is akinetic, there is no apparent left ventricular apical thrombus. Left ventricular ejection fraction, by estimation,  is 35 to 40%. The left ventricle has moderately decreased function. The left ventricle demonstrates regional wall motion abnormalities (see scoring diagram/findings for description). Left ventricular diastolic function could not be evaluated.  2. Right ventricular systolic function is normal. The right ventricular size is normal.  3. The mitral valve is normal in structure. Moderate mitral valve regurgitation. No evidence of mitral stenosis.  4. The aortic valve is normal in structure. Aortic valve regurgitation is not visualized. No aortic stenosis is present.  5. The inferior vena cava is normal in size with greater than 50% respiratory variability, suggesting right atrial pressure of 3 mmHg. FINDINGS  Left Ventricle: The entire left ventricular apex is akinetic, there is no apparent left ventricular apical thrombus. Left ventricular ejection fraction, by estimation, is 35 to 40%. The left ventricle has moderately decreased function. The left ventricle demonstrates regional wall motion abnormalities. Definity contrast agent was given IV to delineate the left ventricular endocardial borders. The left ventricular internal cavity size was normal in size. There is no left ventricular hypertrophy.  Left ventricular diastolic function could not be evaluated due to mitral regurgitation (moderate or greater). Left ventricular diastolic function could not be evaluated.  LV Wall Scoring:  The entire apex is akinetic. The anterior wall, antero-lateral wall, anterior septum, inferior wall, posterior wall, mid inferoseptal segment, and basal inferoseptal segment are hypokinetic. Right Ventricle: The right ventricular size is normal. No increase in right ventricular wall thickness. Right ventricular systolic function is normal. Left Atrium: Left atrial size was normal in size. Right Atrium: Right atrial size was normal in size. Pericardium: There is no evidence of pericardial effusion. Mitral Valve: The mitral valve is normal in structure. Moderate mitral valve regurgitation. No evidence of mitral valve stenosis. Tricuspid Valve: The tricuspid valve is normal in structure. Tricuspid valve regurgitation is not demonstrated. No evidence of tricuspid stenosis. Aortic Valve: The aortic valve is normal in structure. Aortic valve regurgitation is not visualized. No aortic stenosis is present. Pulmonic Valve: The pulmonic valve was normal in structure. Pulmonic valve regurgitation is not visualized. No evidence of pulmonic stenosis. Aorta: The aortic root is normal in size and structure. Venous: The inferior vena cava is normal in size with greater than 50% respiratory variability, suggesting right atrial pressure of 3 mmHg. IAS/Shunts: No atrial level shunt detected by color flow Doppler.  LEFT VENTRICLE PLAX 2D LVOT diam:     2.20 cm LV SV:         55 LV SV Index:   28 LVOT Area:     3.80 cm  LV Volumes (MOD) LV vol d, MOD A2C: 213.0 ml LV vol d, MOD A4C: 295.0 ml LV vol s, MOD A2C: 160.0 ml LV vol s, MOD A4C: 162.0 ml LV SV MOD A2C:     53.0 ml LV SV MOD A4C:     295.0 ml LV SV MOD BP:      91.3 ml RIGHT VENTRICLE RV S prime:     16.50 cm/s TAPSE (M-mode): 2.4 cm LEFT ATRIUM              Index        RIGHT ATRIUM           Index LA Vol (A2C):   103.0 ml 53.22 ml/m  RA Area:     13.90 cm LA Vol (A4C):   115.0 ml 59.42 ml/m  RA Volume:   27.20 ml  14.05 ml/m LA Biplane Vol: 110.0 ml 56.84 ml/m  AORTIC  VALVE LVOT Vmax:   72.70 cm/s LVOT Vmean:  53.600 cm/s LVOT VTI:    0.145 m MR Peak grad:    114.9 mmHg MR Mean grad:    79.0 mmHg    SHUNTS MR Vmax:         536.00 cm/s  Systemic VTI:  0.14 m MR Vmean:        430.0 cm/s   Systemic Diam: 2.20 cm MR PISA:         2.26 cm MR PISA Eff ROA: 14 mm MR PISA Radius:  0.60 cm Kardie Tobb DO Electronically signed by Thomasene Ripple DO Signature Date/Time: 06/06/2023/5:09:28 PM    Final    DG Chest Port 1 View Result Date: 06/06/2023 CLINICAL DATA:  [id number] Encounter for central line placement [id number] EXAM: PORTABLE CHEST 1 VIEW COMPARISON:  Same-day x-ray FINDINGS: Interval placement of left internal jugular approach central venous catheter with distal tip terminating near the superior cavoatrial junction. Stable cardiomegaly. Pulmonary vascular congestion. Similar bibasilar interstitial prominence. No pneumothorax. IMPRESSION: Interval placement of left internal jugular approach central venous catheter with distal tip terminating near the superior cavoatrial junction. No pneumothorax. Electronically Signed   By: Duanne Guess D.O.   On: 06/06/2023 12:33   CT Renal Stone Study Result Date: 06/06/2023 CLINICAL DATA:  Rule out obstruction. History of CHF and shortness of breath. EXAM: CT ABDOMEN AND PELVIS WITHOUT CONTRAST TECHNIQUE: Multidetector CT imaging of the abdomen and pelvis was performed following the standard protocol without IV contrast. RADIATION DOSE REDUCTION: This exam was performed according to the departmental dose-optimization program which includes automated exposure control, adjustment of the mA and/or kV according to patient size and/or use of iterative reconstruction technique. COMPARISON:  CT pelvis 02/03/2022 FINDINGS: Lower chest: Small bilateral pleural effusions with overlying atelectasis. Hepatobiliary: No focal liver abnormality is seen. Status post cholecystectomy. No biliary dilatation. Pancreas: Unremarkable. No pancreatic ductal dilatation  or surrounding inflammatory changes. Spleen: Normal in size without focal abnormality. Adrenals/Urinary Tract: Normal adrenal glands. Mild bilateral renal cortical thinning. No nephrolithiasis, hydronephrosis or mass. The bladder appears normal. Stomach/Bowel: Stomach appears within normal limits. No pathologic dilatation of the large or small bowel loops. The appendix is visualized and appears normal. No bowel wall thickening, inflammation, or distension. Vascular/Lymphatic: Mild aortic atherosclerosis without aneurysm. No signs of abdominopelvic adenopathy. Reproductive: Prostate is unremarkable. Other: No free fluid or fluid collections within the abdomen or pelvis. No signs of pneumoperitoneum. Musculoskeletal: There is soft tissue stranding with mild skin thickening involving the ventral abdominal wall and pannus. Small fat containing umbilical hernia.2 previous rod fixation of bilateral femurs. The bones appear diffusely osteopenic. Multiple remote bilateral rib fractures. There are chronic superior and inferior endplate compression deformities identified involving essentially the entire visualized lower thoracic and lumbar spine. No signs of acute fracture. IMPRESSION: 1. No acute findings within the abdomen or pelvis. No signs of bowel obstruction. 2. Small bilateral pleural effusions with overlying atelectasis. 3. Soft tissue stranding with mild skin thickening involving the ventral abdominal wall and pannus. Correlate for any clinical signs or symptoms of cellulitis. 4. Multiple chronic superior and inferior endplate compression deformities identified involving essentially the entire visualized lower thoracic and lumbar spine. No signs of acute fracture. 5.  Aortic Atherosclerosis (ICD10-I70.0). Electronically Signed   By: Signa Kell M.D.   On: 06/06/2023 09:28   DG Chest Portable 1 View Result Date: 06/06/2023 CLINICAL DATA:  CHF, pulmonary edema/fluid overload, shortness of breath EXAM: PORTABLE  CHEST 1 VIEW COMPARISON:  05/06/2023 FINDINGS: Marked cardiomegaly. Pulmonary vascular congestion. Evaluation is limited by body habitus. Probable airspace opacities in the left lower lobe. No definite pleural effusion or pneumothorax. IMPRESSION: 1. Marked cardiomegaly with pulmonary vascular congestion. 2. Probable left lower lobe airspace disease. Electronically Signed   By: Minerva Fester M.D.   On: 06/06/2023 03:30    Microbiology: No results found for this or any previous visit (from the past 240 hours).   Labs: Basic Metabolic Panel: Recent Labs  Lab 06/11/23 0306 06/12/23 0445 06/14/23 0220 06/15/23 0312 06/16/23 0247 06/17/23 0300  NA 136 133* 137 134* 136 134*  K 4.5 4.2 5.0 4.7 4.7 4.4  CL 101 98 100 100 99 96*  CO2 26 27 27 27 24 29   GLUCOSE 74 77 83 72 79 111*  BUN 40* 24* 64* 41* 61* 39*  CREATININE 6.39* 5.02* 7.90* 5.52* 7.17* 5.41*  CALCIUM 7.9* 8.0* 8.1* 8.0* 8.2* 8.0*  MG 1.9  --   --   --   --   --   PHOS 5.3* 3.8 5.7* 5.5* 7.4*  --    Liver Function Tests: Recent Labs  Lab 06/11/23 0306 06/12/23 0445 06/15/23 0312 06/16/23 0247  ALBUMIN 1.7* 1.9* 1.8* 1.8*   No results for input(s): "LIPASE", "AMYLASE" in the last 168 hours. No results for input(s): "AMMONIA" in the last 168 hours. CBC: Recent Labs  Lab 06/11/23 0306 06/15/23 0312 06/16/23 0247  WBC 7.4 6.9 7.0  HGB 8.2* 8.3* 8.3*  HCT 26.7* 27.7* 28.4*  MCV 94.0 96.5 98.6  PLT 147* 180 196   Cardiac Enzymes: No results for input(s): "CKTOTAL", "CKMB", "CKMBINDEX", "TROPONINI" in the last 168 hours. BNP: BNP (last 3 results) Recent Labs    01/19/23 1111 06/06/23 0311  BNP 769.3* 638.4*    ProBNP (last 3 results) No results for input(s): "PROBNP" in the last 8760 hours.  CBG: Recent Labs  Lab 06/16/23 0908 06/16/23 1136 06/16/23 1252 06/16/23 1927 06/16/23 2110  GLUCAP 106* 94 78 184* 163*       Signed:  Zannie Cove MD.  [facility] 06/17/2023, 10:28  AM

## 2023-06-17 NOTE — Progress Notes (Addendum)
 Contacted attending to inquire if pt will d/c today. Advised pt for likely d/c later today. Contacted [facility] to be advised of pt's d/c later today and that pt should start tomorrow as planned. Contacted renal NP regarding clinic's need for orders. HD arrangements added to AVS and met with pt/mother earlier this week regarding HD arrangements.   Olivia Canter Renal Navigator [phone]  Addendum at 3:45 pm: Advised by team that pt does not have transportation to HD tomorrow. Plan is for pt to d/c tomorrow after HD and start at clinic on Monday. Contacted [facility] to be advised that pt will start on Monday.   Addendum at 4:37 pm: Received a call from pt regarding concerns about transportation at d/c. Contacted CSW with pt's concerns and to inquire if pt can be seen tomorrow to assist with resources.

## 2023-06-17 NOTE — TOC Transition Note (Signed)
 Transition of Care [facility]) - Discharge Note   Patient Details  Name: Jerry Edwards MRN: [id number] Date of Birth: Mar 18, 1971  [facility] of [facility]) CM/SW Contact:  Leone Haven, RN Phone Number: 06/17/2023, 10:35 AM   Clinical Narrative:    For possible dc today, need HH orders,  he has transportation  NCM notified Clifton Custard with [facility].     Barriers to Discharge: Continued Medical Work up   Patient Goals and CMS Choice Patient states their goals for this hospitalization and ongoing recovery are:: wants to remain independent          Discharge Placement                       Discharge Plan and Services Additional resources added to the After Visit Summary for     Discharge Planning Services: CM Consult                                 Social Drivers of Health (SDOH) Interventions SDOH Screenings   Food Insecurity: No Food Insecurity (06/06/2023)  Housing: Low Risk  (06/06/2023)  Transportation Needs: No Transportation Needs (06/06/2023)  Utilities: Not At Risk (06/06/2023)  Depression (PHQ2-9): Low Risk  (05/24/2019)  Physical Activity: Insufficiently Active (03/27/2022)  Social Connections: Patient Declined (06/06/2023)  Tobacco Use: Medium Risk (06/06/2023)     Readmission Risk Interventions     No data to display

## 2023-06-18 ENCOUNTER — Other Ambulatory Visit (HOSPITAL_COMMUNITY): Payer: Self-pay

## 2023-06-18 ENCOUNTER — Telehealth: Payer: Self-pay | Admitting: Nurse Practitioner

## 2023-06-18 LAB — BASIC METABOLIC PANEL WITH GFR
Anion gap: 14 (ref 5–15)
BUN: 67 mg/dL — ABNORMAL HIGH (ref 6–20)
CO2: 24 mmol/L (ref 22–32)
Calcium: 7.9 mg/dL — ABNORMAL LOW (ref 8.9–10.3)
Chloride: 98 mmol/L (ref 98–111)
Creatinine, Ser: 7.48 mg/dL — ABNORMAL HIGH (ref 0.61–1.24)
GFR, Estimated: 8 mL/min — ABNORMAL LOW (ref >60)
Glucose, Bld: 106 mg/dL — ABNORMAL HIGH (ref 70–99)
Potassium: 4.3 mmol/L (ref 3.5–5.1)
Sodium: 136 mmol/L (ref 135–145)

## 2023-06-18 LAB — CBC
HCT: 28.7 % — ABNORMAL LOW (ref 39.0–52.0)
Hemoglobin: 8.4 g/dL — ABNORMAL LOW (ref 13.0–17.0)
MCH: 29.6 pg (ref 26.0–34.0)
MCHC: 29.3 g/dL — ABNORMAL LOW (ref 30.0–36.0)
MCV: 101.1 fL — ABNORMAL HIGH (ref 80.0–100.0)
Platelets: 212 10*3/uL (ref 150–400)
RBC: 2.84 MIL/uL — ABNORMAL LOW (ref 4.22–5.81)
RDW: 15.9 % — ABNORMAL HIGH (ref 11.5–15.5)
WBC: 8.1 10*3/uL (ref 4.0–10.5)
nRBC: 0.2 % (ref 0.0–0.2)

## 2023-06-18 MED ORDER — CYCLOBENZAPRINE HCL 5 MG PO TABS
5.0000 mg | ORAL_TABLET | Freq: Two times a day (BID) | ORAL | 0 refills | Status: AC | PRN
  Filled 2023-06-18: qty 20, 10d supply, fill #0

## 2023-06-18 MED ORDER — FUROSEMIDE 80 MG PO TABS
80.0000 mg | ORAL_TABLET | ORAL | Status: DC
Start: 2023-06-19 — End: 2023-07-06

## 2023-06-18 NOTE — Progress Notes (Signed)
 [facility] NEPHROLOGY PROGRESS NOTE  Assessment/ Plan: Pt is a 53 y.o. yo male    # New ESRD-  progressive CKD as outpatient not previously followed by nephrology per charting.  Most recent GFR as OP in November was 14.   Presented with volume overload, hyperkalemia, and acidosis.  CRRT started on 3/16 mostly to correct hyperkalemia.  S/p RIJ TC by VIR on 3/18. S/p left brachiocephalic AVF placed with Dr. Chestine Spore on 3/26 - Continue HD per MWF schedule.  He is CLIP'd to [facility] on MWF schedule.HD today.   # Hypertension/volume  - fluid overload improved with HD.  Intermittent lasix and would give lasix 80 mg daily on non-HD days on discharge    # Hyperkalemia - improved with RRT    # Anemia of CKD -  on aranesp 150 mcg every Sunday.  Iron sats fine.     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 Monitor lab.   Subjective: Seen and examined.  Tolerating dialysis well.  Denies nausea, vomiting, chest pain or shortness of breath. Objective Vital signs in last 24 hours: Vitals:   06/18/23 0810 06/18/23 0840 06/18/23 0910 06/18/23 0940  BP: 124/76 118/84 122/87 108/88  Pulse: 75 76 80 81  Resp: 19 20 18 17   Temp:      TempSrc:      SpO2: 96% 93% 93% 96%  Weight:      Height:       Weight change: -3.1 kg  Intake/Output Summary (Last 24 hours) at 06/18/2023 1011 Last data filed at 06/18/2023 0020 Gross per 24 hour  Intake 120 ml  Output 250 ml  Net -130 ml       Labs: RENAL PANEL Recent Labs  Lab 06/12/23 0445 06/14/23 0220 06/15/23 0312 06/16/23 0247 06/17/23 0300 06/18/23 0312  NA 133* 137 134* 136 134* 136  K 4.2 5.0 4.7 4.7 4.4 4.3  CL 98 100 100 99 96* 98  CO2 27 27 27 24 29 24   GLUCOSE 77 83 72 79 111* 106*  BUN 24* 64* 41* 61* 39* 67*  CREATININE 5.02* 7.90* 5.52* 7.17* 5.41* 7.48*  CALCIUM 8.0* 8.1* 8.0* 8.2* 8.0* 7.9*  PHOS 3.8 5.7* 5.5* 7.4*  --   --   ALBUMIN 1.9*  --  1.8* 1.8*  --   --     Liver Function  Tests: Recent Labs  Lab 06/12/23 0445 06/15/23 0312 06/16/23 0247  ALBUMIN 1.9* 1.8* 1.8*   No results for input(s): "LIPASE", "AMYLASE" in the last 168 hours. No results for input(s): "AMMONIA" in the last 168 hours. CBC: Recent Labs    06/06/23 0720 06/06/23 1330 06/07/23 0446 06/10/23 0353 06/11/23 0306 06/15/23 0312 06/16/23 0247 06/18/23 0800  HGB  --  7.6*   < > 8.8* 8.2* 8.3* 8.3* 8.4*  MCV  --  95.4   < > 93.1 94.0 96.5 98.6 101.1*  FERRITIN 526* 534*  --   --   --   --   --   --   TIBC 253 238*  --   --   --   --   --   --   IRON 96 99  --   --   --   --   --   --    < > = values in this interval not displayed.    Cardiac Enzymes: No results for input(s): "CKTOTAL", "CKMB", "CKMBINDEX", "TROPONINI" in the last 168 hours. CBG:  Recent Labs  Lab 06/16/23 1252 06/16/23 1927 06/16/23 2110 06/17/23 1154 06/17/23 1510  GLUCAP 78 184* 163* 110* 112*    Iron Studies: No results for input(s): "IRON", "TIBC", "TRANSFERRIN", "FERRITIN" in the last 72 hours. Studies/Results: No results found.  Medications: Infusions:  anticoagulant sodium citrate      Scheduled Medications:  (feeding supplement) PROSource Plus  30 mL Oral BID BM   Chlorhexidine Gluconate Cloth  6 each Topical Q0600   darbepoetin (ARANESP) injection - DIALYSIS  150 mcg Subcutaneous Q Sun-1800   heparin  5,000 Units Subcutaneous Q8H   multivitamin  1 tablet Oral QHS   sevelamer carbonate  1,600 mg Oral TID WC   sodium chloride flush  10-40 mL Intracatheter Q12H    have reviewed scheduled and prn medications.  Physical Exam: General:NAD, comfortable Heart:RRR, s1s2 nl Lungs:clear b/l, no crackle Abdomen:soft, Non-tender, non-distended Extremities:No edema Dialysis Access: Right IJ TDC in place, left upper extremity AV fistula has good thrill and bruit.  Jamicia Haaland Prasad Jonavan Vanhorn 06/18/2023,10:11 AM  LOS: 12 days

## 2023-06-18 NOTE — Progress Notes (Signed)
 PT Cancellation Note  Patient Details Name: Bartley Vuolo MRN: [id number] DOB: 03-22-1971   Cancelled Treatment:    Reason Eval/Treat Not Completed: Patient at procedure or test/unavailable (Pt off floor for HD. Will follow-up as schedule permits.)  Cheri Guppy, PT, DPT Acute Rehabilitation Services Office: [phone] Secure Chat Preferred  [name] 06/18/2023, 9:10 AM

## 2023-06-18 NOTE — Progress Notes (Signed)
 Pt has orders to be discharged. Discharge instructions given and pt has no additional questions at this time. Medication regimen reviewed and pt educated. Pt verbalized understanding and has no additional questions. Telemetry box removed. IV removed and site in good condition.

## 2023-06-18 NOTE — Plan of Care (Signed)
  Problem: Education: Goal: Knowledge of General Education information will improve Description: Including pain rating scale, medication(s)/side effects and non-pharmacologic comfort measures Outcome: Progressing   Problem: Clinical Measurements: Goal: Will remain free from infection Outcome: Progressing Goal: Cardiovascular complication will be avoided Outcome: Progressing   Problem: Coping: Goal: Level of anxiety will decrease Outcome: Progressing   Problem: Elimination: Goal: Will not experience complications related to bowel motility Outcome: Progressing   Problem: Pain Managment: Goal: General experience of comfort will improve and/or be controlled Outcome: Progressing   Problem: Health Behavior/Discharge Planning: Goal: Ability to manage health-related needs will improve Outcome: Not Progressing   Problem: Clinical Measurements: Goal: Respiratory complications will improve Outcome: Not Progressing

## 2023-06-18 NOTE — TOC Progression Note (Addendum)
 Transition of Care [facility]) - Progression Note    Patient Details  Name: Jerry Edwards MRN: [id number] Date of Birth: May 10, 1970  Transition of Care [facility]) CM/SW Contact  Michaela Corner, [place] Phone Number: 06/18/2023, 11:14 AM  Clinical Narrative:   CSW spoke with patients mother, Eulas Post, she states that her and patients father can assist with transportation to get patient to dialysis appointments. CSW will complete [employer] [place] form to assist patient in obtaining transportation. CSW informed Ms. Eulas Post that application may take up to 21 days to be approved. Ms. Eulas Post stated that is okay and that family will assist in getting patient to dialysis in the mean time.   12:00PM CSW spoke with patient about transportation needs. Patient stated that his mother will assist with transportation to HD and medical appointments. CSW informed patient that I will complete [employer] [place] form and submit it on his behalf.   TOC will continue to follow.      Expected Discharge Plan: Home/Self Care Barriers to Discharge: Continued Medical Work up  Expected Discharge Plan and Services   Discharge Planning Services: CM Consult   Living arrangements for the past 2 months: Single Family Home Expected Discharge Date: 06/17/23                                     Social Determinants of Health (SDOH) Interventions SDOH Screenings   Food Insecurity: No Food Insecurity (06/06/2023)  Housing: Low Risk  (06/06/2023)  Transportation Needs: No Transportation Needs (06/06/2023)  Utilities: Not At Risk (06/06/2023)  Depression (PHQ2-9): Low Risk  (05/24/2019)  Physical Activity: Insufficiently Active (03/27/2022)  Social Connections: Patient Declined (06/06/2023)  Tobacco Use: Medium Risk (06/06/2023)    Readmission Risk Interventions     No data to display

## 2023-06-18 NOTE — Progress Notes (Signed)
   06/18/23 1124  Vitals  Temp 98.2 F (36.8 C)  Pulse Rate 71  Resp 19  BP 122/80  SpO2 98 %  O2 Device Nasal Cannula  Oxygen Therapy  O2 Flow Rate (L/min) 2 L/min  Patient Activity (if Appropriate) In bed  Pulse Oximetry Type Continuous  Oximetry Probe Site Changed No  Post Treatment  Dialyzer Clearance Lightly streaked  Hemodialysis Intake (mL) 100 mL  Liters Processed 67.6  Fluid Removed (mL) 2500 mL  Tolerated HD Treatment Yes (Pt completed all but 11 minutes of treatment, rinsed back after fluid bolus and uf off did not resolve cramps.)   Received patient in bed to unit.  Alert and oriented.  Informed consent signed and in chart.   TX duration: 2 hours and 49 minutes  Patient tolerated well.  Transported back to the room  Alert, without acute distress.  Hand-off given to patient's nurse.   Access used: Right chest catheter Access issues: None

## 2023-06-18 NOTE — Progress Notes (Signed)
 D/C order noted. Spoke to pt via phone. Contacted [facility] to be advised of pt's d/c today and that pt should start on Monday. HD arrangements placed on pt's AVS. Renal NP has sent orders to clinic as well.   Olivia Canter Renal Navigator [phone]

## 2023-06-21 ENCOUNTER — Telehealth (HOSPITAL_BASED_OUTPATIENT_CLINIC_OR_DEPARTMENT_OTHER): Payer: Self-pay

## 2023-06-21 DIAGNOSIS — Z992 Dependence on renal dialysis: Secondary | ICD-10-CM | POA: Diagnosis not present

## 2023-06-21 DIAGNOSIS — N186 End stage renal disease: Secondary | ICD-10-CM | POA: Diagnosis not present

## 2023-06-21 DIAGNOSIS — D631 Anemia in chronic kidney disease: Secondary | ICD-10-CM | POA: Diagnosis not present

## 2023-06-21 DIAGNOSIS — D689 Coagulation defect, unspecified: Secondary | ICD-10-CM | POA: Diagnosis not present

## 2023-06-21 NOTE — Telephone Encounter (Signed)
 Completed.

## 2023-06-22 ENCOUNTER — Telehealth: Payer: Self-pay | Admitting: *Deleted

## 2023-06-22 NOTE — Progress Notes (Signed)
 Complex Care Management Note  Care Guide Note 06/22/2023 Name: [name] MRN: [id number] DOB: June 12, 1970  [name] is a 53 y.o. year old male who sees Bensimhon, Bevelyn Buckles, MD for primary care. I reached out to Velora Mediate by phone today to offer complex care management services.  Mr. Strey was given information about Complex Care Management services today including:   The Complex Care Management services include support from the care team which includes your Nurse Care Manager, Clinical Social Worker, or Pharmacist.  The Complex Care Management team is here to help remove barriers to the health concerns and goals most important to you. Complex Care Management services are voluntary, and the patient may decline or stop services at any time by request to their care team member.   Complex Care Management Consent Status: Patient agreed to services and verbal consent obtained.   Follow up plan:  Telephone appointment with complex care management team member scheduled for:  4/3  Encounter Outcome:  Patient Scheduled  Gwenevere Ghazi  [facility]  [facility], [facility] Guide  Direct Dial: [phone]  Fax [phone]

## 2023-06-23 DIAGNOSIS — D689 Coagulation defect, unspecified: Secondary | ICD-10-CM | POA: Diagnosis not present

## 2023-06-23 DIAGNOSIS — Z992 Dependence on renal dialysis: Secondary | ICD-10-CM | POA: Diagnosis not present

## 2023-06-23 DIAGNOSIS — N186 End stage renal disease: Secondary | ICD-10-CM | POA: Diagnosis not present

## 2023-06-23 DIAGNOSIS — N2581 Secondary hyperparathyroidism of renal origin: Secondary | ICD-10-CM | POA: Diagnosis not present

## 2023-06-23 DIAGNOSIS — D631 Anemia in chronic kidney disease: Secondary | ICD-10-CM | POA: Diagnosis not present

## 2023-06-24 ENCOUNTER — Other Ambulatory Visit: Payer: Self-pay

## 2023-06-24 NOTE — Patient Instructions (Signed)
 Visit Information  Thank you for taking time to visit with me today. Please don't hesitate to contact me if I can be of assistance to you before our next scheduled appointment.  Our next appointment is by telephone on Thursday, April 17 at 1:00pm Please call the care guide team at [phone] if you need to cancel or reschedule your appointment.   Here are a few contacts for new Primary Care Physician.  Please call and make a new patient appointment as soon as possible:   -[facility], ph: [phone], Georganna Skeans, MD is accepting new patients.  [facility] Sprang at [place], ph: [phone] -OR call [facility] [phone] for appointment since you are an established patient.     Following is a copy of your care plan:   Goals Addressed             This Visit's Progress    CCM Expected Outcome:  Monitor, Self-Manage and Reduce Symptoms of Kidney Failure   New Goal    Current Barriers:  Knowledge Deficits related to plan of care for management of ESRD   RNCM Clinical Goal(s):  Patient will verbalize understanding of plan for management of ESRD as evidenced by attending all dialysis appointments, monitoring new AV fistula site for infection and reporting, taking medications as prescribed  through collaboration with RN Care manager, provider, and care team.   Interventions: Evaluation of current treatment plan related to  self management and patient's adherence to plan as established by provider -Discussed importance of attending every Dialysis appointment -Discussed s/s of infection related to newly placed AV fistula, he is aware of activity restrictions at this time, aware to not have BP/needle sticks in left arm.      Chronic Kidney Disease Interventions:  (Status:  New goal.) Long Term Goal Evaluation of current treatment plan related to chronic kidney disease self management and patient's adherence to plan as established by provider       Reviewed prescribed diet , low potassium, low sodium, portion control Reviewed medications with patient and discussed importance of compliance    Reviewed scheduled/upcoming provider appointments including    Advised patient to discuss oxygen needs (his oxygen levels in the hospital on   with provider    Last practice recorded BP readings:  BP Readings from Last 3 Encounters:  06/18/23 122/77  05/06/23 (!) 141/96  01/19/23 (!) 160/94   Most recent eGFR/CrCl: No results found for: "EGFR"  No components found for: "CRCL"  Patient Goals/Self-Care Activities: Take all medications as prescribed Attend all scheduled provider appointments Call provider office for new concerns or questions   Follow Up Plan:  Telephone follow up appointment with care management team member scheduled for:  April 17, 1:00pm The patient will call Cardiology  as advised to inquire if he qualifies for home oxygen - he was recently hospitalized and placed on O2 while in the hospital. No orders were placed for home oxygen, but patient feels he is short of breath at times.  He has ordered his own [employer] Portable oxygen concentrator and paying out of pocket. He will also obtain pulse oximeter to monitor oxygen levels.           Please call the Suicide and Crisis Lifeline: 988 if you are experiencing a Mental Health or Behavioral Health Crisis or need someone to talk to.  Patient verbalizes understanding of instructions and care plan provided today and agrees to view in MyChart. Active MyChart status and patient  understanding of how to access instructions and care plan via MyChart confirmed with patient.     Jeani Hawking BSN, CCM [place]  VBCI Population Health RN Care Manager Direct Dial: [phone]  Fax: [phone]

## 2023-06-24 NOTE — Patient Outreach (Signed)
 .CCM Chronic Care Management   CCM RN Visit Note  06/24/2023 Name: Jerry Edwards MRN: [id number] DOB: Aug 03, 1970  Subjective: Jerry Edwards is a 53 y.o. year old male who is a primary care patient of Bensimhon, Bevelyn Buckles, MD. The patient was referred to the Chronic Care Management team for assistance with care management needs subsequent to provider initiation of CCM services and plan of care.    Today's Visit:  Engaged with patient by telephone for initial visit.     SDOH Interventions Today    Flowsheet Row Most Recent Value  SDOH Interventions   Food Insecurity Interventions Intervention Not Indicated  Housing Interventions Intervention Not Indicated  Transportation Interventions Intervention Not Indicated  Utilities Interventions Intervention Not Indicated         Goals Addressed             This Visit's Progress    CCM Expected Outcome:  Monitor, Self-Manage and Reduce Symptoms of Kidney Failure   New Goal    Current Barriers:  Knowledge Deficits related to plan of care for management of ESRD   RNCM Clinical Goal(s):  Patient will verbalize understanding of plan for management of ESRD as evidenced by attending all dialysis appointments, monitoring new AV fistula site for infection and reporting, taking medications as prescribed  through collaboration with RN Care manager, provider, and care team.   Interventions: Evaluation of current treatment plan related to  self management and patient's adherence to plan as established by provider -Discussed importance of attending every Dialysis appointment -Discussed s/s of infection related to newly placed AV fistula, he is aware of activity restrictions at this time, aware to not have BP/needle sticks in left arm.      Chronic Kidney Disease Interventions:  (Status:  New goal.) Long Term Goal Evaluation of current treatment plan related to chronic kidney disease self management and patient's adherence to plan as established by  provider      Reviewed prescribed diet , low potassium, low sodium, portion control Reviewed medications with patient and discussed importance of compliance    Reviewed scheduled/upcoming provider appointments including    Advised patient to discuss oxygen needs  with provider    Last practice recorded BP readings:  BP Readings from Last 3 Encounters:  06/18/23 122/77  05/06/23 (!) 141/96  01/19/23 (!) 160/94   Most recent eGFR/CrCl: No results found for: "EGFR"  No components found for: "CRCL"  Patient Goals/Self-Care Activities: Take all medications as prescribed Attend all scheduled provider appointments Call provider office for new concerns or questions   Follow Up Plan:  Telephone follow up appointment with care management team member scheduled for:  April 17, 1:00pm The patient will call Cardiology  as advised to inquire if he qualifies for home oxygen - he was recently hospitalized and placed on O2 while in the hospital. No orders were placed for home oxygen, but patient feels he is short of breath at times.  He has ordered his own [employer] Portable oxygen concentrator and paying out of pocket. He will also obtain pulse oximeter to monitor oxygen levels.           Plan:Telephone follow up appointment with care management team member scheduled for:  April 17 at 1:00pm  The patient has been provided with contact information for the care management team and has been advised to call with any health related questions or concerns.  The patient will call Cardiology as advised to inquire of home oxygen needs.  He will  also call office of PCP to make new patient appointment - list provided of area PCP's.   Jeani Hawking BSN, CCM [place]  VBCI Population Health RN Care Manager Direct Dial: [phone]  Fax: [phone]

## 2023-06-25 DIAGNOSIS — Z992 Dependence on renal dialysis: Secondary | ICD-10-CM | POA: Diagnosis not present

## 2023-06-25 DIAGNOSIS — N2581 Secondary hyperparathyroidism of renal origin: Secondary | ICD-10-CM | POA: Diagnosis not present

## 2023-06-25 DIAGNOSIS — D631 Anemia in chronic kidney disease: Secondary | ICD-10-CM | POA: Diagnosis not present

## 2023-06-25 DIAGNOSIS — D689 Coagulation defect, unspecified: Secondary | ICD-10-CM | POA: Diagnosis not present

## 2023-06-25 DIAGNOSIS — N186 End stage renal disease: Secondary | ICD-10-CM | POA: Diagnosis not present

## 2023-06-27 DIAGNOSIS — G4733 Obstructive sleep apnea (adult) (pediatric): Secondary | ICD-10-CM | POA: Diagnosis not present

## 2023-06-28 ENCOUNTER — Telehealth (HOSPITAL_COMMUNITY): Payer: Self-pay

## 2023-06-28 DIAGNOSIS — D689 Coagulation defect, unspecified: Secondary | ICD-10-CM | POA: Diagnosis not present

## 2023-06-28 DIAGNOSIS — Z992 Dependence on renal dialysis: Secondary | ICD-10-CM | POA: Diagnosis not present

## 2023-06-28 DIAGNOSIS — N186 End stage renal disease: Secondary | ICD-10-CM | POA: Diagnosis not present

## 2023-06-28 DIAGNOSIS — N2581 Secondary hyperparathyroidism of renal origin: Secondary | ICD-10-CM | POA: Diagnosis not present

## 2023-06-28 DIAGNOSIS — D631 Anemia in chronic kidney disease: Secondary | ICD-10-CM | POA: Diagnosis not present

## 2023-06-28 NOTE — Progress Notes (Signed)
 Patient ID: Jerry Edwards, male   DOB: 1970-07-10, 53 y.o.   MRN: [id number]   [facility] PROGRESS NOTE    Primary Physician: Dolores Patty, MD Primary Cardiologist: [name] Cardiologist: Dr. Brantley Persons [facility]  HPI: Jerry Edwards is a 53 y.o. male with history of NICM, HTN, OSA, chronic HFrEF, ESRD, and osteogenesis imperfecta.   He has been follow in the HF clinic for many years.   Admitted March 2025 with increase shortness of breath. Creatinine 13 and K 7.5. Placed on CRRT and later transitioned to [facility].   Today he returns for HF follow up.Overall feeling fine. Gets short of breath getting in and out of the car.  Denies PND/Orthopnea. He is getting HD M-W-F. Appetite ok. No fever or chills. Dry weight ~ 205.  Taking all medications. Live with his Mom and she is able to transport him to appointments.   Cardiac studies: - RHC (3/23)  RA = 9 RV = 58/14 PA = 61/30 (40) PCW = 21 (no significant v-waves) Fick cardiac output/index = 7.4/4.0 PVR = 2.6 WU FA sat = 92% PA sat = 69%, 72% SVC sat = 74%   - TEE (3/23):  LEFT VENTRICLE: EF = 30-55%. Anterior and anteroapical AK MITRAL VALVE:    Mild P2 restriction with 2-3+ (mod-severe central and posterior MR). No significant reversal of flow in PVs  Echo 3/22 EF 30%, severe MR, Normal RV Echo 11/22 EF 35-40% Echo 10/19 EF 30-35% Echo 09/20/17: EF 25-30%, grade 2 DD Echo 10/13/16 LVEF 35-40%, Grade 1 DD, Mild MR, Mild LAE Echo 1/17 30-35% RV ok  Echo 02/22/16 LVEF 40%, Grade 1 DD.    - Myoview 10/15/16 No change in EKG from baseline T wave inversions in the inferolateral leads There is a medium defect of mild severity present in the basal inferior, mid inferior and apical inferior location. The defect is non-reversible and consistent with prior infarct. There is a small defect of moderate severity present in the apex location. The defect is partially reversible and consistent with infarct with possible small  area of peri infarct ischemia. This is a high risk study. EF not calculated but visually appears to be moderately to severely reduced.   LHC/RHC 04/08/13  Normal coronaries RA pressure:  39/39 with a mean of 34. Prominent X. and Y. Descends.  RV pressure: 76/26  mean 4 PA pressure: 70/36 with a mean of 51  Pulmonary capillary wedge pressure: 31/30 and mean of 29  LV pressure: 140/26 with a left ventricular end-diastolic pressure of 35  PA sat 66%  AO sat 97%  CO/CI 4.94/2.63  Pulmonary vascular resistance: 4.45 Woods units   PMH: 1. HTN 2. Osteogenesis imperfecta: wheelchair-bound.  History of bone fractures.  3. Suspect OSA 4. ESRD  5. Nonischemic cardiomyopathy: Echo (1/15) with EF 20-25%, severe diffuse hypokinesis, mild MR, mildly enlarged RV with moderately decreased systolic function.  LHC/RHC (1/15) with normal coronaries, mean RA 34, PA 70/36 mean 51, mean PCWP 29, CI 2.63.  Cardiac MRI (1/15) with EF 38%, global hypokinesis, discrete areas of delayed enhancement in the subepicardial mid inferoseptal RV insertion site, the mid wall of the mid inferolateral wall, and the subendocardial mid anterolateral wall.  This pattern was suggestive of infiltrative disease.  UPEP/SPEP negative, HIV negative.  Chest CT to look for evidence of sarcoidosis did not show any sarcoid-type lesions.    SH:  Single, prior smoking now quit, occasional marijuana, works as Geologist, engineering.   FH:  HTN, no significant cardiac disease.   Current Outpatient Medications  Medication Sig Dispense Refill   aspirin 81 MG tablet Take 1 tablet (81 mg total) by mouth daily. (Patient taking differently: Take 81 mg by mouth 4 (four) times a week. Tuesday, Thursday, Saturday, Sunday)     cyclobenzaprine (FLEXERIL) 5 MG tablet Take 1 tablet (5 mg total) by mouth 2 (two) times daily as needed for muscle spasms. 20 tablet 0   furosemide (LASIX) 80 MG tablet Take 1 tablet (80 mg total) by mouth every Tuesday, Thursday, Saturday,  and Sunday.     Olopatadine HCl (PATADAY OP) Place 1 drop into both eyes in the morning and at bedtime.     OXYGEN Inhale 2 L into the lungs as needed (shortness of breath).     polyethylene glycol powder (GLYCOLAX/MIRALAX) 17 GM/SCOOP powder Take 1 capful (17 g) with water by mouth daily as needed for constipation. 238 g 0   sevelamer carbonate (RENVELA) 800 MG tablet Take 2 tablets (1,600 mg total) by mouth 3 (three) times daily with meals. 90 tablet 0   traMADol (ULTRAM) 50 MG tablet Take 1 tablet (50 mg total) by mouth every 8 (eight) hours as needed for severe pain (pain score 7-10). 30 tablet 0   No current facility-administered medications for this encounter.   BP (!) 140/90   Pulse 93   Ht 4\' 11"  (1.499 m)   Wt 94.9 kg (209 lb 3.2 oz)   SpO2 96%   BMI 42.25 kg/m   Wt Readings from Last 3 Encounters:  06/29/23 94.9 kg (209 lb 3.2 oz)  06/18/23 96.1 kg (211 lb 13.8 oz)  05/06/23 101.6 kg (224 lb)    PHYSICAL EXAM: General:   No resp difficulty. Arrived in a wheel chair.  Neck: supple. no JVD.  Cor: PMI nondisplaced. Regular rate & rhythm. No rubs, gallops or murmurs. R upper chest tunneled catheter.  Lungs: clear Abdomen: soft, nontender, nondistended.  Extremities: no cyanosis, clubbing, rash, edema. L AVF with intact incision/scab Neuro: alert & oriented x3  ASSESSMENT & PLAN:  1. Chronic Systolic Heart Failure/NICM  - unable to fit in cMRI machine due to body stature.  PYP scan 8/18 no amyloid - Echo EF has been 25-35% dating back to 2018. Most recent Echo 2025 EF 30-35%.  - Has LBBB but QRS not wide enough for CRT - Stable NYHA II-III GDMIT limited by CKD Stage V -Remains on lasix 80 mg daily on non HD days.  - Add coreg 3.125 mg twice a day. May need to stop if BP drops during HD.   2. Hypertension  -Off all meds. As above adding coreg.   3.  Mitral Regurgitation  - TEE 3/23 showed Mild P2 restriction with 2-3+ (mod-severe central and posterior MR). Findings  were d/w structural heart team. Valve is amenable to clipping but not severe enough at this point.  -Echo 2025 Moderate MR - Repeat ECHO 6 months   4. Chronic Respiratory Failure/suspected OHS - Continue nocturnal oxygen.   5. OSA  -  Had PSG on 11/07/17 with AHI 88/hr c/w very severe OSA.   6.  ESRD S/P AVF. Waiting for AVF to mature. Started on iHD and has been tolerating 3 days a week.   Follow up in 3-4 months with Dr Gala Romney.   Tonye Becket, NP  10:41 AM

## 2023-06-28 NOTE — Telephone Encounter (Signed)
 Called to confirm/remind patient of their appointment at the [facility] on 06/29/2023 10:30.   Appointment:   [x] Confirmed  [] Left mess   [] No answer/No voice mail  [] Phone not in service  Patient reminded to bring all medications and/or complete list.  Confirmed patient has transportation. Gave directions, instructed to utilize valet parking.

## 2023-06-29 ENCOUNTER — Ambulatory Visit (HOSPITAL_COMMUNITY)
Admission: RE | Admit: 2023-06-29 | Discharge: 2023-06-29 | Disposition: A | Discharge status: Home / Self Care | Source: Ambulatory Visit | Attending: Adult Health | Admitting: Adult Health

## 2023-06-29 ENCOUNTER — Encounter (HOSPITAL_COMMUNITY): Payer: Self-pay

## 2023-06-29 VITALS — BP 140/90 | HR 93 | Ht 59.0 in | Wt 209.2 lb

## 2023-06-29 DIAGNOSIS — G4733 Obstructive sleep apnea (adult) (pediatric): Secondary | ICD-10-CM | POA: Insufficient documentation

## 2023-06-29 DIAGNOSIS — Z79899 Other long term (current) drug therapy: Secondary | ICD-10-CM | POA: Insufficient documentation

## 2023-06-29 DIAGNOSIS — I10 Essential (primary) hypertension: Secondary | ICD-10-CM

## 2023-06-29 DIAGNOSIS — I428 Other cardiomyopathies: Secondary | ICD-10-CM | POA: Diagnosis not present

## 2023-06-29 DIAGNOSIS — N186 End stage renal disease: Secondary | ICD-10-CM | POA: Insufficient documentation

## 2023-06-29 DIAGNOSIS — Q780 Osteogenesis imperfecta: Secondary | ICD-10-CM | POA: Diagnosis not present

## 2023-06-29 DIAGNOSIS — I5022 Chronic systolic (congestive) heart failure: Secondary | ICD-10-CM | POA: Insufficient documentation

## 2023-06-29 DIAGNOSIS — I447 Left bundle-branch block, unspecified: Secondary | ICD-10-CM | POA: Diagnosis not present

## 2023-06-29 DIAGNOSIS — R0602 Shortness of breath: Secondary | ICD-10-CM | POA: Insufficient documentation

## 2023-06-29 DIAGNOSIS — I340 Nonrheumatic mitral (valve) insufficiency: Secondary | ICD-10-CM | POA: Insufficient documentation

## 2023-06-29 DIAGNOSIS — I132 Hypertensive heart and chronic kidney disease with heart failure and with stage 5 chronic kidney disease, or end stage renal disease: Secondary | ICD-10-CM | POA: Insufficient documentation

## 2023-06-29 DIAGNOSIS — I5023 Acute on chronic systolic (congestive) heart failure: Secondary | ICD-10-CM | POA: Diagnosis not present

## 2023-06-29 DIAGNOSIS — Z993 Dependence on wheelchair: Secondary | ICD-10-CM | POA: Insufficient documentation

## 2023-06-29 MED ORDER — CARVEDILOL 3.125 MG PO TABS: 3.1250 mg | ORAL_TABLET | Freq: Two times a day (BID) | ORAL | 11 refills | Status: AC

## 2023-06-29 NOTE — Patient Instructions (Signed)
 Start Coreg 3.125 mg twice daily - Rx sent. Return to see Dr. Gala Romney in 3 - 4 months. Please call us at [phone] in June to schedule this appointment.  Please call us at [phone] if any questions or concerns prior to your next appointment.

## 2023-06-30 DIAGNOSIS — Z992 Dependence on renal dialysis: Secondary | ICD-10-CM | POA: Diagnosis not present

## 2023-06-30 DIAGNOSIS — D631 Anemia in chronic kidney disease: Secondary | ICD-10-CM | POA: Diagnosis not present

## 2023-06-30 DIAGNOSIS — N2581 Secondary hyperparathyroidism of renal origin: Secondary | ICD-10-CM | POA: Diagnosis not present

## 2023-06-30 DIAGNOSIS — N186 End stage renal disease: Secondary | ICD-10-CM | POA: Diagnosis not present

## 2023-06-30 DIAGNOSIS — D689 Coagulation defect, unspecified: Secondary | ICD-10-CM | POA: Diagnosis not present

## 2023-07-02 ENCOUNTER — Other Ambulatory Visit (HOSPITAL_COMMUNITY): Payer: Self-pay | Admitting: Internal Medicine

## 2023-07-02 ENCOUNTER — Other Ambulatory Visit (HOSPITAL_COMMUNITY): Payer: Self-pay

## 2023-07-02 ENCOUNTER — Encounter: Payer: Self-pay | Admitting: Internal Medicine

## 2023-07-02 DIAGNOSIS — D631 Anemia in chronic kidney disease: Secondary | ICD-10-CM | POA: Diagnosis not present

## 2023-07-02 DIAGNOSIS — N184 Chronic kidney disease, stage 4 (severe): Secondary | ICD-10-CM

## 2023-07-02 DIAGNOSIS — N2581 Secondary hyperparathyroidism of renal origin: Secondary | ICD-10-CM | POA: Diagnosis not present

## 2023-07-02 DIAGNOSIS — Z992 Dependence on renal dialysis: Secondary | ICD-10-CM | POA: Diagnosis not present

## 2023-07-02 DIAGNOSIS — N186 End stage renal disease: Secondary | ICD-10-CM | POA: Diagnosis not present

## 2023-07-02 DIAGNOSIS — D689 Coagulation defect, unspecified: Secondary | ICD-10-CM | POA: Diagnosis not present

## 2023-07-02 DIAGNOSIS — I5022 Chronic systolic (congestive) heart failure: Secondary | ICD-10-CM

## 2023-07-02 MED ORDER — SEVELAMER CARBONATE 800 MG PO TABS
1600.0000 mg | ORAL_TABLET | Freq: Three times a day (TID) | ORAL | 0 refills | Status: AC
Start: 2023-07-02 — End: ?

## 2023-07-05 DIAGNOSIS — N2581 Secondary hyperparathyroidism of renal origin: Secondary | ICD-10-CM | POA: Diagnosis not present

## 2023-07-05 DIAGNOSIS — N186 End stage renal disease: Secondary | ICD-10-CM | POA: Diagnosis not present

## 2023-07-05 DIAGNOSIS — D631 Anemia in chronic kidney disease: Secondary | ICD-10-CM | POA: Diagnosis not present

## 2023-07-05 DIAGNOSIS — D689 Coagulation defect, unspecified: Secondary | ICD-10-CM | POA: Diagnosis not present

## 2023-07-05 DIAGNOSIS — Z992 Dependence on renal dialysis: Secondary | ICD-10-CM | POA: Diagnosis not present

## 2023-07-07 DIAGNOSIS — N186 End stage renal disease: Secondary | ICD-10-CM | POA: Diagnosis not present

## 2023-07-07 DIAGNOSIS — D689 Coagulation defect, unspecified: Secondary | ICD-10-CM | POA: Diagnosis not present

## 2023-07-07 DIAGNOSIS — D631 Anemia in chronic kidney disease: Secondary | ICD-10-CM | POA: Diagnosis not present

## 2023-07-07 DIAGNOSIS — N2581 Secondary hyperparathyroidism of renal origin: Secondary | ICD-10-CM | POA: Diagnosis not present

## 2023-07-07 DIAGNOSIS — Z992 Dependence on renal dialysis: Secondary | ICD-10-CM | POA: Diagnosis not present

## 2023-07-08 ENCOUNTER — Other Ambulatory Visit: Payer: Self-pay

## 2023-07-08 NOTE — Patient Instructions (Signed)
 Visit Information  Thank you for taking time to visit with me today. Please don't hesitate to contact me if I can be of assistance to you before our next scheduled appointment.  Your next care management appointment is by telephone on Thursday, May 15 at 1:00pm   Please call the care guide team at [phone] if you need to cancel, schedule, or reschedule an appointment.   Please  if you are experiencing a Mental Health or Behavioral Health Crisis or need someone to talk to.  Jurline Olmsted BSN, CCM [place]  VBCI Population Health RN Care Manager Direct Dial: [phone]  Fax: [phone]
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
## 2023-07-09 DIAGNOSIS — D689 Coagulation defect, unspecified: Secondary | ICD-10-CM | POA: Diagnosis not present

## 2023-07-09 DIAGNOSIS — N186 End stage renal disease: Secondary | ICD-10-CM | POA: Diagnosis not present

## 2023-07-09 DIAGNOSIS — D631 Anemia in chronic kidney disease: Secondary | ICD-10-CM | POA: Diagnosis not present

## 2023-07-09 DIAGNOSIS — Z992 Dependence on renal dialysis: Secondary | ICD-10-CM | POA: Diagnosis not present

## 2023-07-09 DIAGNOSIS — N2581 Secondary hyperparathyroidism of renal origin: Secondary | ICD-10-CM | POA: Diagnosis not present

## 2023-07-12 DIAGNOSIS — D689 Coagulation defect, unspecified: Secondary | ICD-10-CM | POA: Diagnosis not present

## 2023-07-12 DIAGNOSIS — N2581 Secondary hyperparathyroidism of renal origin: Secondary | ICD-10-CM | POA: Diagnosis not present

## 2023-07-12 DIAGNOSIS — D631 Anemia in chronic kidney disease: Secondary | ICD-10-CM | POA: Diagnosis not present

## 2023-07-12 DIAGNOSIS — N186 End stage renal disease: Secondary | ICD-10-CM | POA: Diagnosis not present

## 2023-07-12 DIAGNOSIS — Z992 Dependence on renal dialysis: Secondary | ICD-10-CM | POA: Diagnosis not present

## 2023-07-14 DIAGNOSIS — D631 Anemia in chronic kidney disease: Secondary | ICD-10-CM | POA: Diagnosis not present

## 2023-07-14 DIAGNOSIS — Z992 Dependence on renal dialysis: Secondary | ICD-10-CM | POA: Diagnosis not present

## 2023-07-14 DIAGNOSIS — D689 Coagulation defect, unspecified: Secondary | ICD-10-CM | POA: Diagnosis not present

## 2023-07-14 DIAGNOSIS — N2581 Secondary hyperparathyroidism of renal origin: Secondary | ICD-10-CM | POA: Diagnosis not present

## 2023-07-14 DIAGNOSIS — N186 End stage renal disease: Secondary | ICD-10-CM | POA: Diagnosis not present

## 2023-07-16 DIAGNOSIS — N186 End stage renal disease: Secondary | ICD-10-CM | POA: Diagnosis not present

## 2023-07-16 DIAGNOSIS — D631 Anemia in chronic kidney disease: Secondary | ICD-10-CM | POA: Diagnosis not present

## 2023-07-16 DIAGNOSIS — Z992 Dependence on renal dialysis: Secondary | ICD-10-CM | POA: Diagnosis not present

## 2023-07-16 DIAGNOSIS — D689 Coagulation defect, unspecified: Secondary | ICD-10-CM | POA: Diagnosis not present

## 2023-07-16 DIAGNOSIS — N2581 Secondary hyperparathyroidism of renal origin: Secondary | ICD-10-CM | POA: Diagnosis not present

## 2023-07-19 DIAGNOSIS — N2581 Secondary hyperparathyroidism of renal origin: Secondary | ICD-10-CM | POA: Diagnosis not present

## 2023-07-19 DIAGNOSIS — D631 Anemia in chronic kidney disease: Secondary | ICD-10-CM | POA: Diagnosis not present

## 2023-07-19 DIAGNOSIS — N186 End stage renal disease: Secondary | ICD-10-CM | POA: Diagnosis not present

## 2023-07-19 DIAGNOSIS — Z992 Dependence on renal dialysis: Secondary | ICD-10-CM | POA: Diagnosis not present

## 2023-07-19 DIAGNOSIS — D689 Coagulation defect, unspecified: Secondary | ICD-10-CM | POA: Diagnosis not present

## 2023-07-21 DIAGNOSIS — N186 End stage renal disease: Secondary | ICD-10-CM | POA: Diagnosis not present

## 2023-07-21 DIAGNOSIS — D689 Coagulation defect, unspecified: Secondary | ICD-10-CM | POA: Diagnosis not present

## 2023-07-21 DIAGNOSIS — N179 Acute kidney failure, unspecified: Secondary | ICD-10-CM | POA: Diagnosis not present

## 2023-07-21 DIAGNOSIS — E875 Hyperkalemia: Secondary | ICD-10-CM | POA: Diagnosis not present

## 2023-07-21 DIAGNOSIS — I120 Hypertensive chronic kidney disease with stage 5 chronic kidney disease or end stage renal disease: Secondary | ICD-10-CM | POA: Diagnosis not present

## 2023-07-21 DIAGNOSIS — N2581 Secondary hyperparathyroidism of renal origin: Secondary | ICD-10-CM | POA: Diagnosis not present

## 2023-07-21 DIAGNOSIS — I509 Heart failure, unspecified: Secondary | ICD-10-CM | POA: Diagnosis not present

## 2023-07-21 DIAGNOSIS — Z992 Dependence on renal dialysis: Secondary | ICD-10-CM | POA: Diagnosis not present

## 2023-07-21 DIAGNOSIS — D631 Anemia in chronic kidney disease: Secondary | ICD-10-CM | POA: Diagnosis not present

## 2023-07-22 DIAGNOSIS — G4733 Obstructive sleep apnea (adult) (pediatric): Secondary | ICD-10-CM | POA: Diagnosis not present

## 2023-07-22 DIAGNOSIS — R2689 Other abnormalities of gait and mobility: Secondary | ICD-10-CM | POA: Diagnosis not present

## 2023-07-22 DIAGNOSIS — I5022 Chronic systolic (congestive) heart failure: Secondary | ICD-10-CM | POA: Diagnosis not present

## 2023-07-23 DIAGNOSIS — Z992 Dependence on renal dialysis: Secondary | ICD-10-CM | POA: Diagnosis not present

## 2023-07-23 DIAGNOSIS — N2581 Secondary hyperparathyroidism of renal origin: Secondary | ICD-10-CM | POA: Diagnosis not present

## 2023-07-23 DIAGNOSIS — D689 Coagulation defect, unspecified: Secondary | ICD-10-CM | POA: Diagnosis not present

## 2023-07-23 DIAGNOSIS — N186 End stage renal disease: Secondary | ICD-10-CM | POA: Diagnosis not present

## 2023-07-23 DIAGNOSIS — Z118 Encounter for screening for other infectious and parasitic diseases: Secondary | ICD-10-CM | POA: Diagnosis not present

## 2023-07-26 ENCOUNTER — Other Ambulatory Visit: Payer: Self-pay

## 2023-07-26 DIAGNOSIS — N2581 Secondary hyperparathyroidism of renal origin: Secondary | ICD-10-CM | POA: Diagnosis not present

## 2023-07-26 DIAGNOSIS — N179 Acute kidney failure, unspecified: Secondary | ICD-10-CM

## 2023-07-26 DIAGNOSIS — N186 End stage renal disease: Secondary | ICD-10-CM | POA: Diagnosis not present

## 2023-07-26 DIAGNOSIS — Z118 Encounter for screening for other infectious and parasitic diseases: Secondary | ICD-10-CM | POA: Diagnosis not present

## 2023-07-26 DIAGNOSIS — D689 Coagulation defect, unspecified: Secondary | ICD-10-CM | POA: Diagnosis not present

## 2023-07-26 DIAGNOSIS — Z992 Dependence on renal dialysis: Secondary | ICD-10-CM | POA: Diagnosis not present

## 2023-07-28 DIAGNOSIS — N186 End stage renal disease: Secondary | ICD-10-CM | POA: Diagnosis not present

## 2023-07-28 DIAGNOSIS — Z992 Dependence on renal dialysis: Secondary | ICD-10-CM | POA: Diagnosis not present

## 2023-07-28 DIAGNOSIS — Z118 Encounter for screening for other infectious and parasitic diseases: Secondary | ICD-10-CM | POA: Diagnosis not present

## 2023-07-28 DIAGNOSIS — D689 Coagulation defect, unspecified: Secondary | ICD-10-CM | POA: Diagnosis not present

## 2023-07-28 DIAGNOSIS — N2581 Secondary hyperparathyroidism of renal origin: Secondary | ICD-10-CM | POA: Diagnosis not present

## 2023-07-30 DIAGNOSIS — Z118 Encounter for screening for other infectious and parasitic diseases: Secondary | ICD-10-CM | POA: Diagnosis not present

## 2023-07-30 DIAGNOSIS — N186 End stage renal disease: Secondary | ICD-10-CM | POA: Diagnosis not present

## 2023-07-30 DIAGNOSIS — N2581 Secondary hyperparathyroidism of renal origin: Secondary | ICD-10-CM | POA: Diagnosis not present

## 2023-07-30 DIAGNOSIS — D689 Coagulation defect, unspecified: Secondary | ICD-10-CM | POA: Diagnosis not present

## 2023-07-30 DIAGNOSIS — Z992 Dependence on renal dialysis: Secondary | ICD-10-CM | POA: Diagnosis not present

## 2023-08-02 DIAGNOSIS — N186 End stage renal disease: Secondary | ICD-10-CM | POA: Diagnosis not present

## 2023-08-02 DIAGNOSIS — D689 Coagulation defect, unspecified: Secondary | ICD-10-CM | POA: Diagnosis not present

## 2023-08-02 DIAGNOSIS — Z992 Dependence on renal dialysis: Secondary | ICD-10-CM | POA: Diagnosis not present

## 2023-08-02 DIAGNOSIS — Z118 Encounter for screening for other infectious and parasitic diseases: Secondary | ICD-10-CM | POA: Diagnosis not present

## 2023-08-02 DIAGNOSIS — N2581 Secondary hyperparathyroidism of renal origin: Secondary | ICD-10-CM | POA: Diagnosis not present

## 2023-08-03 ENCOUNTER — Ambulatory Visit (HOSPITAL_COMMUNITY)
Admission: RE | Admit: 2023-08-03 | Discharge: 2023-08-03 | Disposition: A | Discharge status: Home / Self Care | Source: Ambulatory Visit | Attending: Vascular Surgery | Admitting: Vascular Surgery

## 2023-08-03 DIAGNOSIS — N179 Acute kidney failure, unspecified: Secondary | ICD-10-CM | POA: Diagnosis not present

## 2023-08-04 DIAGNOSIS — Z992 Dependence on renal dialysis: Secondary | ICD-10-CM | POA: Diagnosis not present

## 2023-08-04 DIAGNOSIS — N186 End stage renal disease: Secondary | ICD-10-CM | POA: Diagnosis not present

## 2023-08-04 DIAGNOSIS — N2581 Secondary hyperparathyroidism of renal origin: Secondary | ICD-10-CM | POA: Diagnosis not present

## 2023-08-04 DIAGNOSIS — D689 Coagulation defect, unspecified: Secondary | ICD-10-CM | POA: Diagnosis not present

## 2023-08-04 DIAGNOSIS — Z118 Encounter for screening for other infectious and parasitic diseases: Secondary | ICD-10-CM | POA: Diagnosis not present

## 2023-08-05 ENCOUNTER — Other Ambulatory Visit: Payer: Self-pay

## 2023-08-06 DIAGNOSIS — D689 Coagulation defect, unspecified: Secondary | ICD-10-CM | POA: Diagnosis not present

## 2023-08-06 DIAGNOSIS — Z992 Dependence on renal dialysis: Secondary | ICD-10-CM | POA: Diagnosis not present

## 2023-08-06 DIAGNOSIS — N186 End stage renal disease: Secondary | ICD-10-CM | POA: Diagnosis not present

## 2023-08-06 DIAGNOSIS — N2581 Secondary hyperparathyroidism of renal origin: Secondary | ICD-10-CM | POA: Diagnosis not present

## 2023-08-06 DIAGNOSIS — Z118 Encounter for screening for other infectious and parasitic diseases: Secondary | ICD-10-CM | POA: Diagnosis not present

## 2023-08-09 DIAGNOSIS — Z118 Encounter for screening for other infectious and parasitic diseases: Secondary | ICD-10-CM | POA: Diagnosis not present

## 2023-08-09 DIAGNOSIS — D689 Coagulation defect, unspecified: Secondary | ICD-10-CM | POA: Diagnosis not present

## 2023-08-09 DIAGNOSIS — N186 End stage renal disease: Secondary | ICD-10-CM | POA: Diagnosis not present

## 2023-08-09 DIAGNOSIS — Z992 Dependence on renal dialysis: Secondary | ICD-10-CM | POA: Diagnosis not present

## 2023-08-09 DIAGNOSIS — N2581 Secondary hyperparathyroidism of renal origin: Secondary | ICD-10-CM | POA: Diagnosis not present

## 2023-08-10 ENCOUNTER — Ambulatory Visit: Attending: Vascular Surgery | Admitting: Physician Assistant

## 2023-08-10 VITALS — BP 121/82 | HR 85 | Temp 97.6°F | Resp 18 | Ht 59.0 in | Wt 203.9 lb

## 2023-08-10 DIAGNOSIS — N186 End stage renal disease: Secondary | ICD-10-CM

## 2023-08-10 DIAGNOSIS — Z992 Dependence on renal dialysis: Secondary | ICD-10-CM

## 2023-08-10 NOTE — Progress Notes (Signed)
 Postoperative Access Visit   History of Present Illness   Jerry Edwards is a 53 y.o. year old male who presents for postoperative follow-up for: left brachiocephalic AV fistula placement on 06/16/23 by Dr. Fulton Job. The patient's wounds are well healed.  The patient notes no steal symptoms.  He currently dialyzes via right internal jugular TDC on MWF at the [facility] location.   Physical Examination   Vitals:   08/10/23 1101  BP: 121/82  Pulse: 85  Resp: 18  Temp: 97.6 F (36.4 C)  SpO2: (!) 87%     left arm Incision is well healed, 2+ radial pulse, hand grip is 5/5, sensation in digits is intact, palpable thrill, bruit can  be auscultated     Non invasive vascular lab:   Findings:  +--------------------+----------+-----------------+--------+  AVF                PSV (cm/s)Flow Vol (mL/min)Comments  +--------------------+----------+-----------------+--------+  Native artery inflow   155           770                 +--------------------+----------+-----------------+--------+  AVF Anastomosis        562                               +--------------------+----------+-----------------+--------+     +------------+----------+-------------+----------+--------+  OUTFLOW VEINPSV (cm/s)Diameter (cm)Depth (cm)Describe  +------------+----------+-------------+----------+--------+  Shoulder       83        0.61        0.82             +------------+----------+-------------+----------+--------+  Prox UA         82        0.64        0.40             +------------+----------+-------------+----------+--------+  Mid UA          51        0.78        0.38             +------------+----------+-------------+----------+--------+  Dist UA         86        0.72        0.34             +------------+----------+-------------+----------+--------+  AC Fossa       166        0.95        0.22              +------------+----------+-------------+----------+--------+     Summary:  LEFT:  - Patent arteriovenous fistula with mildly elevated velocities at the anastmosis (Ratio: 3.6)  - No evidence of outflow stenosis or obstruction.  - Flow volume: 770 mL/min.  - Patent and antegrade flow at the distal radial and ulnar arteries.   Medical Decision Making   Jerry Edwards is a 53 y.o. year old male who presents s/p left brachiocephalic AV fistula placement on 06/16/23 by Dr. Fulton Job. The patient's wounds are well healed.  The patient notes no steal symptoms.   The patient's access will be ready for use after 09/16/23 The patient's tunneled dialysis catheter can be removed when Nephrology is comfortable with the performance of the left AV fistula The patient may follow up on a prn basis   Deneen Finical, PA-C [facility] [place] Office: [phone]  Clinic MD: Fulton Job

## 2023-08-11 DIAGNOSIS — Z992 Dependence on renal dialysis: Secondary | ICD-10-CM | POA: Diagnosis not present

## 2023-08-11 DIAGNOSIS — D689 Coagulation defect, unspecified: Secondary | ICD-10-CM | POA: Diagnosis not present

## 2023-08-11 DIAGNOSIS — Z118 Encounter for screening for other infectious and parasitic diseases: Secondary | ICD-10-CM | POA: Diagnosis not present

## 2023-08-11 DIAGNOSIS — N2581 Secondary hyperparathyroidism of renal origin: Secondary | ICD-10-CM | POA: Diagnosis not present

## 2023-08-11 DIAGNOSIS — N186 End stage renal disease: Secondary | ICD-10-CM | POA: Diagnosis not present

## 2023-08-13 DIAGNOSIS — N186 End stage renal disease: Secondary | ICD-10-CM | POA: Diagnosis not present

## 2023-08-13 DIAGNOSIS — D689 Coagulation defect, unspecified: Secondary | ICD-10-CM | POA: Diagnosis not present

## 2023-08-13 DIAGNOSIS — N2581 Secondary hyperparathyroidism of renal origin: Secondary | ICD-10-CM | POA: Diagnosis not present

## 2023-08-13 DIAGNOSIS — Z992 Dependence on renal dialysis: Secondary | ICD-10-CM | POA: Diagnosis not present

## 2023-08-13 DIAGNOSIS — Z118 Encounter for screening for other infectious and parasitic diseases: Secondary | ICD-10-CM | POA: Diagnosis not present

## 2023-08-16 DIAGNOSIS — N186 End stage renal disease: Secondary | ICD-10-CM | POA: Diagnosis not present

## 2023-08-16 DIAGNOSIS — N2581 Secondary hyperparathyroidism of renal origin: Secondary | ICD-10-CM | POA: Diagnosis not present

## 2023-08-16 DIAGNOSIS — Z992 Dependence on renal dialysis: Secondary | ICD-10-CM | POA: Diagnosis not present

## 2023-08-16 DIAGNOSIS — Z118 Encounter for screening for other infectious and parasitic diseases: Secondary | ICD-10-CM | POA: Diagnosis not present

## 2023-08-16 DIAGNOSIS — D689 Coagulation defect, unspecified: Secondary | ICD-10-CM | POA: Diagnosis not present

## 2023-08-18 DIAGNOSIS — D689 Coagulation defect, unspecified: Secondary | ICD-10-CM | POA: Diagnosis not present

## 2023-08-18 DIAGNOSIS — N2581 Secondary hyperparathyroidism of renal origin: Secondary | ICD-10-CM | POA: Diagnosis not present

## 2023-08-18 DIAGNOSIS — Z992 Dependence on renal dialysis: Secondary | ICD-10-CM | POA: Diagnosis not present

## 2023-08-18 DIAGNOSIS — N186 End stage renal disease: Secondary | ICD-10-CM | POA: Diagnosis not present

## 2023-08-18 DIAGNOSIS — Z118 Encounter for screening for other infectious and parasitic diseases: Secondary | ICD-10-CM | POA: Diagnosis not present

## 2023-08-19 ENCOUNTER — Other Ambulatory Visit: Payer: Self-pay

## 2023-08-20 DIAGNOSIS — N186 End stage renal disease: Secondary | ICD-10-CM | POA: Diagnosis not present

## 2023-08-20 DIAGNOSIS — N2581 Secondary hyperparathyroidism of renal origin: Secondary | ICD-10-CM | POA: Diagnosis not present

## 2023-08-20 DIAGNOSIS — Z992 Dependence on renal dialysis: Secondary | ICD-10-CM | POA: Diagnosis not present

## 2023-08-20 DIAGNOSIS — D689 Coagulation defect, unspecified: Secondary | ICD-10-CM | POA: Diagnosis not present

## 2023-08-20 DIAGNOSIS — Z118 Encounter for screening for other infectious and parasitic diseases: Secondary | ICD-10-CM | POA: Diagnosis not present

## 2023-08-21 DIAGNOSIS — I129 Hypertensive chronic kidney disease with stage 1 through stage 4 chronic kidney disease, or unspecified chronic kidney disease: Secondary | ICD-10-CM | POA: Diagnosis not present

## 2023-08-21 DIAGNOSIS — Z992 Dependence on renal dialysis: Secondary | ICD-10-CM | POA: Diagnosis not present

## 2023-08-21 DIAGNOSIS — N186 End stage renal disease: Secondary | ICD-10-CM | POA: Diagnosis not present

## 2023-08-22 DIAGNOSIS — G4733 Obstructive sleep apnea (adult) (pediatric): Secondary | ICD-10-CM | POA: Diagnosis not present

## 2023-08-23 DIAGNOSIS — N186 End stage renal disease: Secondary | ICD-10-CM | POA: Diagnosis not present

## 2023-08-23 DIAGNOSIS — Z992 Dependence on renal dialysis: Secondary | ICD-10-CM | POA: Diagnosis not present

## 2023-08-23 DIAGNOSIS — D689 Coagulation defect, unspecified: Secondary | ICD-10-CM | POA: Diagnosis not present

## 2023-08-23 DIAGNOSIS — D631 Anemia in chronic kidney disease: Secondary | ICD-10-CM | POA: Diagnosis not present

## 2023-08-23 DIAGNOSIS — N2581 Secondary hyperparathyroidism of renal origin: Secondary | ICD-10-CM | POA: Diagnosis not present

## 2023-08-25 DIAGNOSIS — Z992 Dependence on renal dialysis: Secondary | ICD-10-CM | POA: Diagnosis not present

## 2023-08-25 DIAGNOSIS — D631 Anemia in chronic kidney disease: Secondary | ICD-10-CM | POA: Diagnosis not present

## 2023-08-25 DIAGNOSIS — N2581 Secondary hyperparathyroidism of renal origin: Secondary | ICD-10-CM | POA: Diagnosis not present

## 2023-08-25 DIAGNOSIS — N186 End stage renal disease: Secondary | ICD-10-CM | POA: Diagnosis not present

## 2023-08-25 DIAGNOSIS — D689 Coagulation defect, unspecified: Secondary | ICD-10-CM | POA: Diagnosis not present

## 2023-08-27 DIAGNOSIS — N2581 Secondary hyperparathyroidism of renal origin: Secondary | ICD-10-CM | POA: Diagnosis not present

## 2023-08-27 DIAGNOSIS — D631 Anemia in chronic kidney disease: Secondary | ICD-10-CM | POA: Diagnosis not present

## 2023-08-27 DIAGNOSIS — Z992 Dependence on renal dialysis: Secondary | ICD-10-CM | POA: Diagnosis not present

## 2023-08-27 DIAGNOSIS — D689 Coagulation defect, unspecified: Secondary | ICD-10-CM | POA: Diagnosis not present

## 2023-08-27 DIAGNOSIS — N186 End stage renal disease: Secondary | ICD-10-CM | POA: Diagnosis not present

## 2023-08-30 DIAGNOSIS — D631 Anemia in chronic kidney disease: Secondary | ICD-10-CM | POA: Diagnosis not present

## 2023-08-30 DIAGNOSIS — Z992 Dependence on renal dialysis: Secondary | ICD-10-CM | POA: Diagnosis not present

## 2023-08-30 DIAGNOSIS — N186 End stage renal disease: Secondary | ICD-10-CM | POA: Diagnosis not present

## 2023-08-30 DIAGNOSIS — D689 Coagulation defect, unspecified: Secondary | ICD-10-CM | POA: Diagnosis not present

## 2023-08-30 DIAGNOSIS — N2581 Secondary hyperparathyroidism of renal origin: Secondary | ICD-10-CM | POA: Diagnosis not present

## 2023-09-01 DIAGNOSIS — N186 End stage renal disease: Secondary | ICD-10-CM | POA: Diagnosis not present

## 2023-09-01 DIAGNOSIS — N2581 Secondary hyperparathyroidism of renal origin: Secondary | ICD-10-CM | POA: Diagnosis not present

## 2023-09-01 DIAGNOSIS — Z992 Dependence on renal dialysis: Secondary | ICD-10-CM | POA: Diagnosis not present

## 2023-09-01 DIAGNOSIS — D631 Anemia in chronic kidney disease: Secondary | ICD-10-CM | POA: Diagnosis not present

## 2023-09-01 DIAGNOSIS — D689 Coagulation defect, unspecified: Secondary | ICD-10-CM | POA: Diagnosis not present

## 2023-09-03 DIAGNOSIS — N186 End stage renal disease: Secondary | ICD-10-CM | POA: Diagnosis not present

## 2023-09-03 DIAGNOSIS — Z992 Dependence on renal dialysis: Secondary | ICD-10-CM | POA: Diagnosis not present

## 2023-09-03 DIAGNOSIS — N2581 Secondary hyperparathyroidism of renal origin: Secondary | ICD-10-CM | POA: Diagnosis not present

## 2023-09-03 DIAGNOSIS — D631 Anemia in chronic kidney disease: Secondary | ICD-10-CM | POA: Diagnosis not present

## 2023-09-03 DIAGNOSIS — D689 Coagulation defect, unspecified: Secondary | ICD-10-CM | POA: Diagnosis not present

## 2023-09-06 DIAGNOSIS — N2581 Secondary hyperparathyroidism of renal origin: Secondary | ICD-10-CM | POA: Diagnosis not present

## 2023-09-06 DIAGNOSIS — D631 Anemia in chronic kidney disease: Secondary | ICD-10-CM | POA: Diagnosis not present

## 2023-09-06 DIAGNOSIS — Z992 Dependence on renal dialysis: Secondary | ICD-10-CM | POA: Diagnosis not present

## 2023-09-06 DIAGNOSIS — N186 End stage renal disease: Secondary | ICD-10-CM | POA: Diagnosis not present

## 2023-09-06 DIAGNOSIS — D689 Coagulation defect, unspecified: Secondary | ICD-10-CM | POA: Diagnosis not present

## 2023-09-08 DIAGNOSIS — N186 End stage renal disease: Secondary | ICD-10-CM | POA: Diagnosis not present

## 2023-09-08 DIAGNOSIS — D631 Anemia in chronic kidney disease: Secondary | ICD-10-CM | POA: Diagnosis not present

## 2023-09-08 DIAGNOSIS — D689 Coagulation defect, unspecified: Secondary | ICD-10-CM | POA: Diagnosis not present

## 2023-09-08 DIAGNOSIS — Z992 Dependence on renal dialysis: Secondary | ICD-10-CM | POA: Diagnosis not present

## 2023-09-08 DIAGNOSIS — N2581 Secondary hyperparathyroidism of renal origin: Secondary | ICD-10-CM | POA: Diagnosis not present

## 2023-09-09 ENCOUNTER — Other Ambulatory Visit: Payer: Self-pay

## 2023-09-10 DIAGNOSIS — D631 Anemia in chronic kidney disease: Secondary | ICD-10-CM | POA: Diagnosis not present

## 2023-09-10 DIAGNOSIS — N186 End stage renal disease: Secondary | ICD-10-CM | POA: Diagnosis not present

## 2023-09-10 DIAGNOSIS — D689 Coagulation defect, unspecified: Secondary | ICD-10-CM | POA: Diagnosis not present

## 2023-09-10 DIAGNOSIS — Z992 Dependence on renal dialysis: Secondary | ICD-10-CM | POA: Diagnosis not present

## 2023-09-10 DIAGNOSIS — N2581 Secondary hyperparathyroidism of renal origin: Secondary | ICD-10-CM | POA: Diagnosis not present

## 2023-09-13 ENCOUNTER — Encounter: Payer: Self-pay | Admitting: General Practice

## 2023-09-13 DIAGNOSIS — D631 Anemia in chronic kidney disease: Secondary | ICD-10-CM | POA: Diagnosis not present

## 2023-09-13 DIAGNOSIS — N2581 Secondary hyperparathyroidism of renal origin: Secondary | ICD-10-CM | POA: Diagnosis not present

## 2023-09-13 DIAGNOSIS — D689 Coagulation defect, unspecified: Secondary | ICD-10-CM | POA: Diagnosis not present

## 2023-09-13 DIAGNOSIS — N186 End stage renal disease: Secondary | ICD-10-CM | POA: Diagnosis not present

## 2023-09-13 DIAGNOSIS — Z992 Dependence on renal dialysis: Secondary | ICD-10-CM | POA: Diagnosis not present

## 2023-09-15 DIAGNOSIS — I5022 Chronic systolic (congestive) heart failure: Secondary | ICD-10-CM | POA: Diagnosis not present

## 2023-09-15 DIAGNOSIS — R2689 Other abnormalities of gait and mobility: Secondary | ICD-10-CM | POA: Diagnosis not present

## 2023-09-15 DIAGNOSIS — D631 Anemia in chronic kidney disease: Secondary | ICD-10-CM | POA: Diagnosis not present

## 2023-09-15 DIAGNOSIS — D689 Coagulation defect, unspecified: Secondary | ICD-10-CM | POA: Diagnosis not present

## 2023-09-15 DIAGNOSIS — Z992 Dependence on renal dialysis: Secondary | ICD-10-CM | POA: Diagnosis not present

## 2023-09-15 DIAGNOSIS — G4733 Obstructive sleep apnea (adult) (pediatric): Secondary | ICD-10-CM | POA: Diagnosis not present

## 2023-09-15 DIAGNOSIS — N186 End stage renal disease: Secondary | ICD-10-CM | POA: Diagnosis not present

## 2023-09-15 DIAGNOSIS — N2581 Secondary hyperparathyroidism of renal origin: Secondary | ICD-10-CM | POA: Diagnosis not present

## 2023-09-17 DIAGNOSIS — D689 Coagulation defect, unspecified: Secondary | ICD-10-CM | POA: Diagnosis not present

## 2023-09-17 DIAGNOSIS — D631 Anemia in chronic kidney disease: Secondary | ICD-10-CM | POA: Diagnosis not present

## 2023-09-17 DIAGNOSIS — Z992 Dependence on renal dialysis: Secondary | ICD-10-CM | POA: Diagnosis not present

## 2023-09-17 DIAGNOSIS — N186 End stage renal disease: Secondary | ICD-10-CM | POA: Diagnosis not present

## 2023-09-17 DIAGNOSIS — N2581 Secondary hyperparathyroidism of renal origin: Secondary | ICD-10-CM | POA: Diagnosis not present

## 2023-09-17 IMAGING — US US RENAL
1 series · 14 of 25 positions shown · non-contrast
Comparison: None.

CLINICAL DATA: Renal dysfunction

EXAM:
RENAL / URINARY TRACT ULTRASOUND COMPLETE

[Series 1: us renal · 14 of 51 slices shown]
[im 1/51]
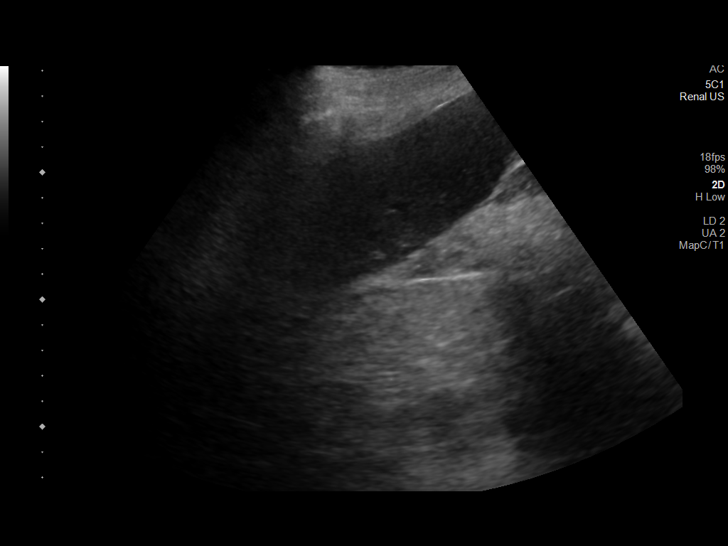
[im 5/51]
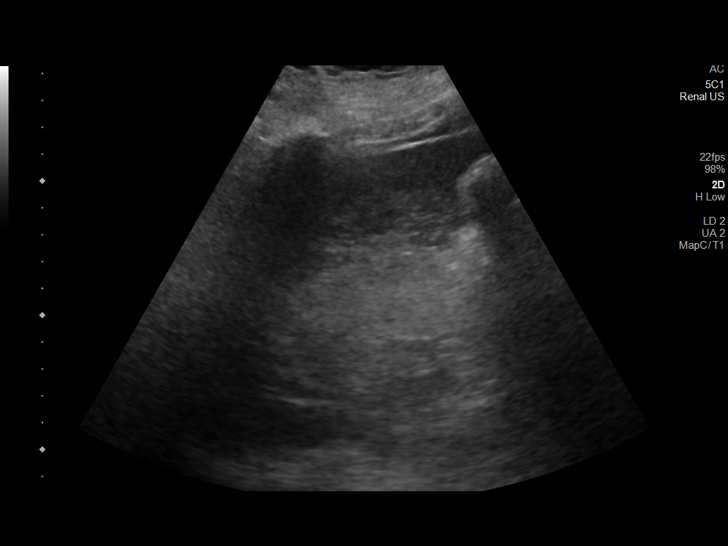
[im 9/51]
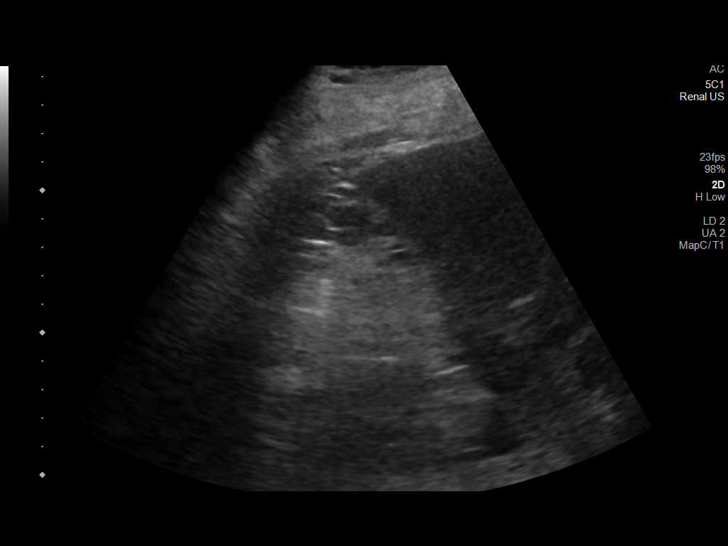
[im 13/51]
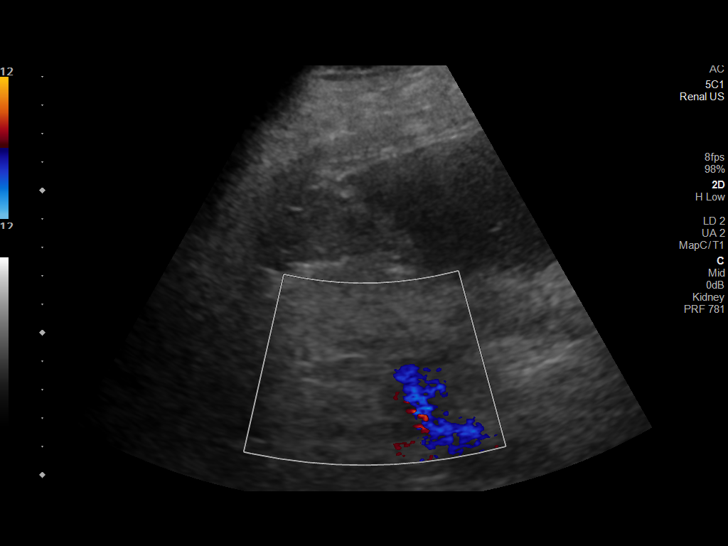
[im 17/51]
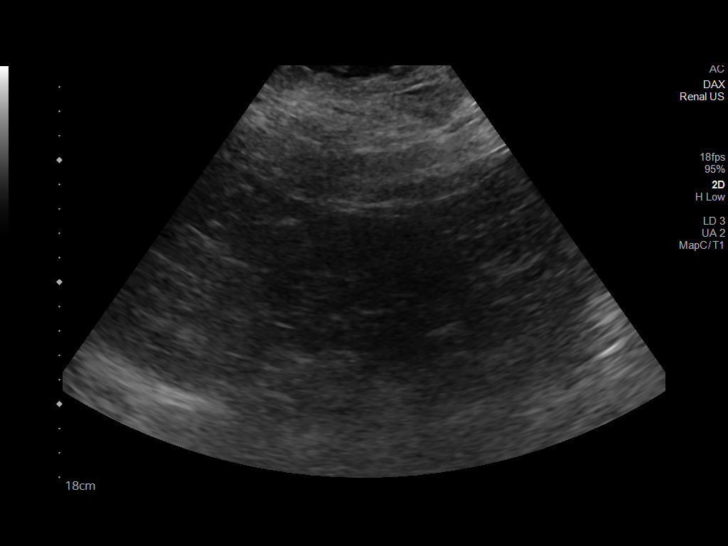
[im 19/51]
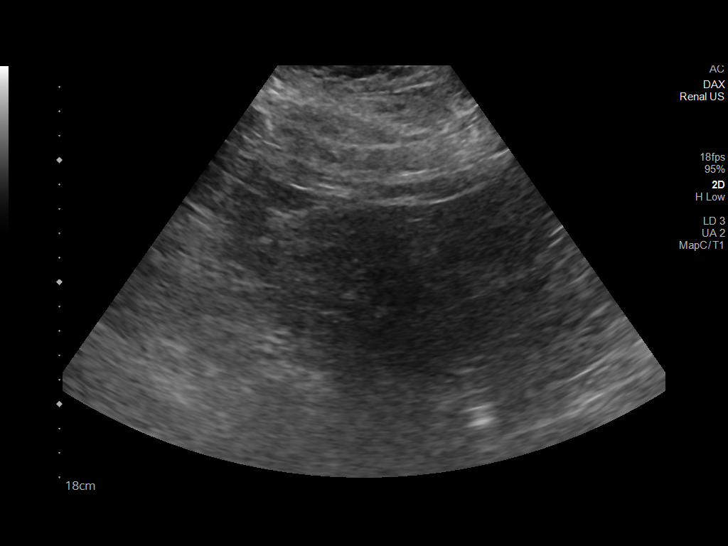
[im 23/51]
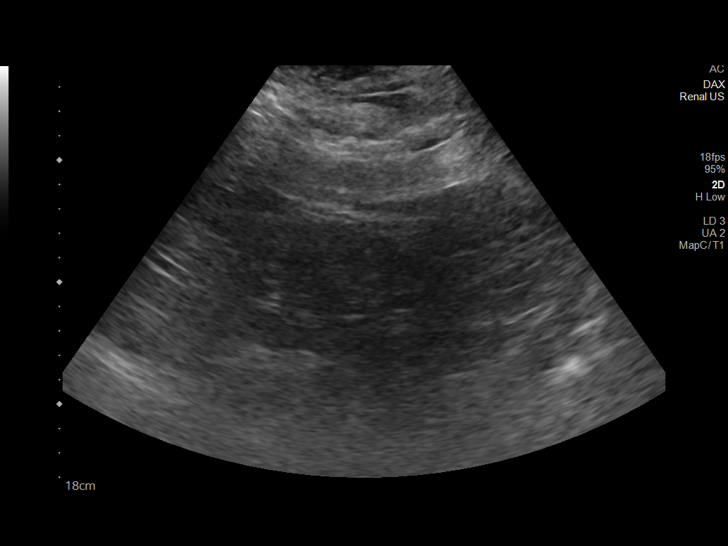
[im 28/51]
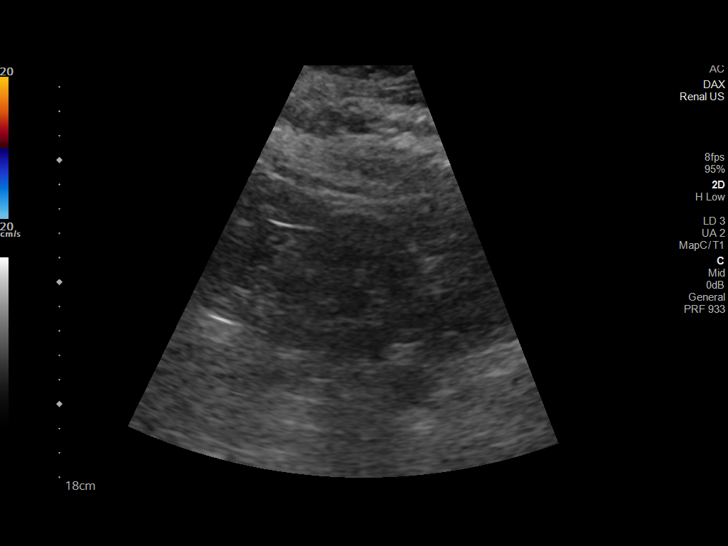
[im 32/51]
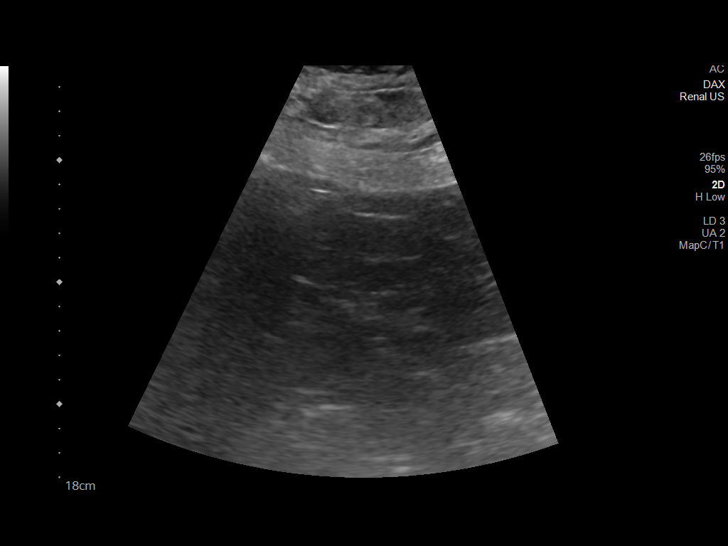
[im 34/51]
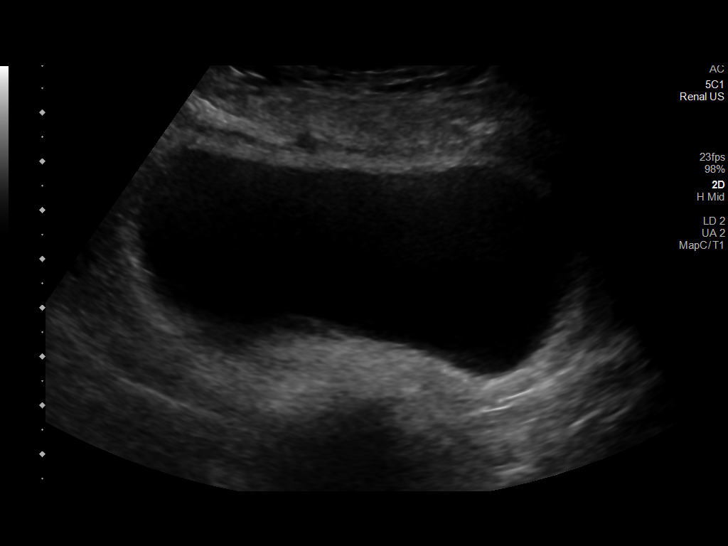
[im 38/51]
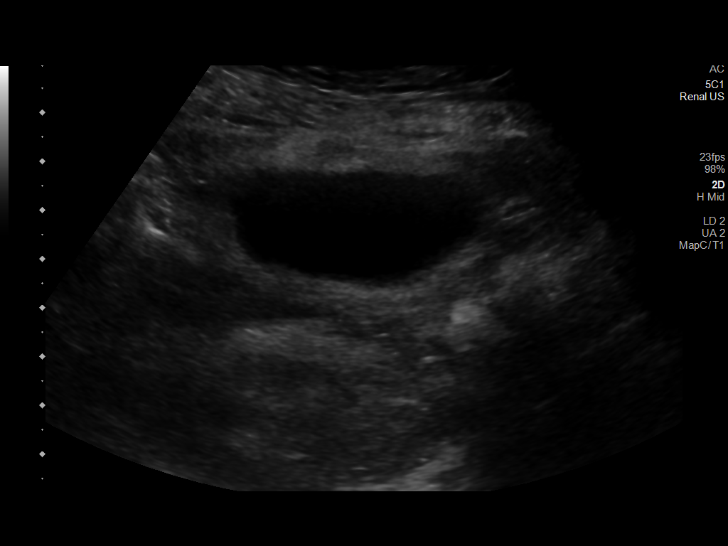
[im 42/51]
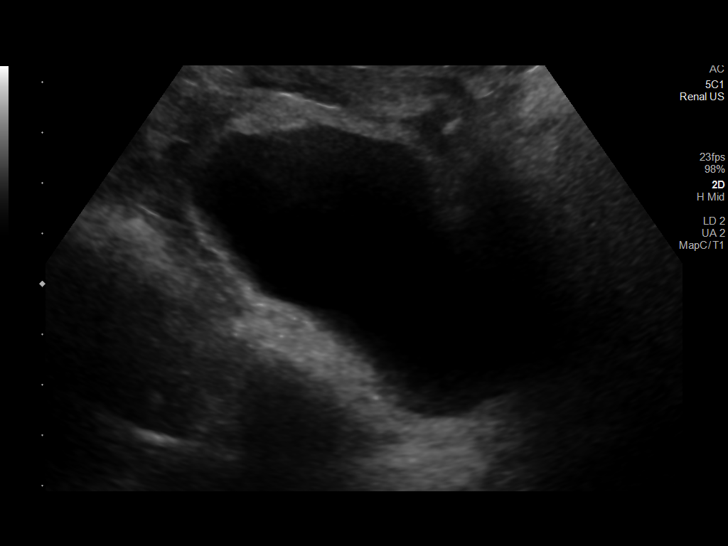
[im 46/51]
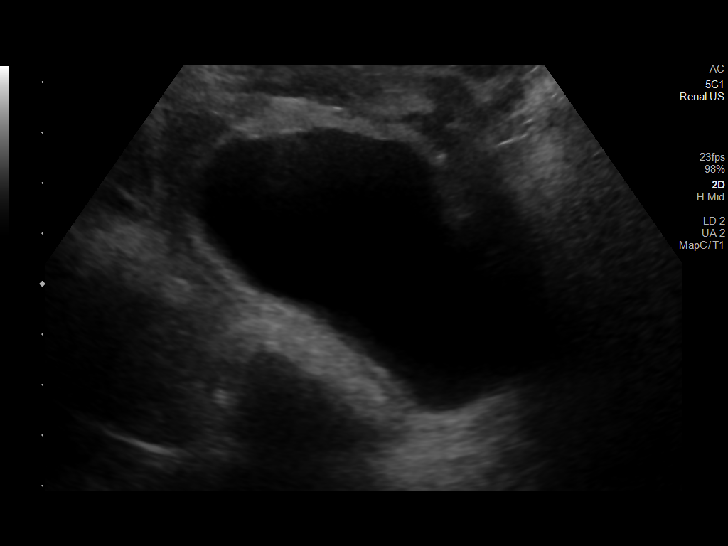
[im 51/51]
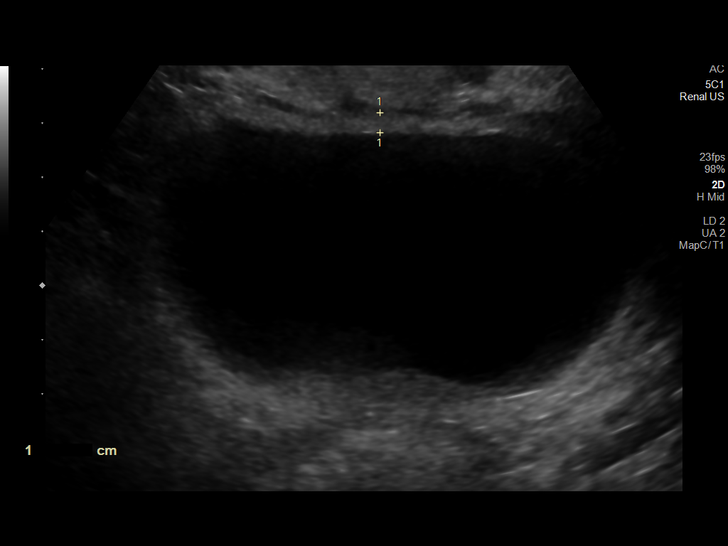

[14 of 25 positions shown; findings below may reference images not displayed]

FINDINGS: Right Kidney:

Renal measurements: 11.6 x 6.3 x 4.7 cm = volume: 179.7 mL. There is
no hydronephrosis. There is marked increase in cortical
echogenicity, possibly suggesting medical renal disease or scarring.

Left Kidney:

Renal measurements: 9.5 x 5.7 x 5.6 cm = volume: 158.2 mL. There is
no hydronephrosis. Margins are indistinct.

Bladder:

Appears normal for degree of bladder distention.

Other:

According to the note by the technologist, examination was
technically limited due to patient's body habitus and overlying
bowel gas.
IMPRESSION: There is no hydronephrosis. There is increased cortical echogenicity
in the right kidney.

## 2023-09-20 DIAGNOSIS — I129 Hypertensive chronic kidney disease with stage 1 through stage 4 chronic kidney disease, or unspecified chronic kidney disease: Secondary | ICD-10-CM | POA: Diagnosis not present

## 2023-09-20 DIAGNOSIS — N2581 Secondary hyperparathyroidism of renal origin: Secondary | ICD-10-CM | POA: Diagnosis not present

## 2023-09-20 DIAGNOSIS — D689 Coagulation defect, unspecified: Secondary | ICD-10-CM | POA: Diagnosis not present

## 2023-09-20 DIAGNOSIS — Z992 Dependence on renal dialysis: Secondary | ICD-10-CM | POA: Diagnosis not present

## 2023-09-20 DIAGNOSIS — N186 End stage renal disease: Secondary | ICD-10-CM | POA: Diagnosis not present

## 2023-09-20 DIAGNOSIS — D631 Anemia in chronic kidney disease: Secondary | ICD-10-CM | POA: Diagnosis not present

## 2023-09-21 ENCOUNTER — Other Ambulatory Visit: Payer: Self-pay
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
## 2023-09-21 NOTE — Patient Instructions (Signed)
 Visit Information  Thank you for taking time to visit with me today. Please don't hesitate to contact me if I can be of assistance to you before our next scheduled appointment.  Your next care management appointment is by telephone on Tuesday, July 29th at 1:00pm.   Please call the care guide team at [phone] if you need to cancel, schedule, or reschedule an appointment.  A reminder to ALL patients/family/friends, please call the [place]  National Suicide Prevention Lifeline: 9732072365 or TTY: (213) 844-2977 TTY 6063229075) to talk to a trained counselor if you are experiencing a Mental Health or Behavioral Health Crisis or need someone to talk to.  Santana Stamp BSN, CCM [place]  VBCI Population Health RN Care Manager Direct Dial: [phone]  Fax: [phone]

## 2023-09-22 DIAGNOSIS — N186 End stage renal disease: Secondary | ICD-10-CM | POA: Diagnosis not present

## 2023-09-22 DIAGNOSIS — N2581 Secondary hyperparathyroidism of renal origin: Secondary | ICD-10-CM | POA: Diagnosis not present

## 2023-09-22 DIAGNOSIS — D631 Anemia in chronic kidney disease: Secondary | ICD-10-CM | POA: Diagnosis not present

## 2023-09-22 DIAGNOSIS — D689 Coagulation defect, unspecified: Secondary | ICD-10-CM | POA: Diagnosis not present

## 2023-09-22 DIAGNOSIS — Z992 Dependence on renal dialysis: Secondary | ICD-10-CM | POA: Diagnosis not present

## 2023-09-23 ENCOUNTER — Encounter: Payer: Self-pay | Admitting: Family

## 2023-09-23 ENCOUNTER — Ambulatory Visit (INDEPENDENT_AMBULATORY_CARE_PROVIDER_SITE_OTHER): Admitting: Family

## 2023-09-23 VITALS — BP 123/74 | HR 83

## 2023-09-23 DIAGNOSIS — R059 Cough, unspecified: Secondary | ICD-10-CM

## 2023-09-23 DIAGNOSIS — Z7689 Persons encountering health services in other specified circumstances: Secondary | ICD-10-CM | POA: Diagnosis not present

## 2023-09-23 MED ORDER — PSEUDOEPH-BROMPHEN-DM 30-2-10 MG/5ML PO SYRP: 5.0000 mL | ORAL_SOLUTION | Freq: Four times a day (QID) | ORAL | 0 refills | Status: DC | PRN

## 2023-09-23 MED ORDER — BENZONATATE 100 MG PO CAPS: 100.0000 mg | ORAL_CAPSULE | Freq: Three times a day (TID) | ORAL | 0 refills | Status: DC | PRN

## 2023-09-23 NOTE — Progress Notes (Signed)
 Subjective:    Jerry Edwards - 53 y.o. male MRN [id number]  Date of birth: 05-31-1970  HPI  Jerry Edwards is to establish care.   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 States no recent broken bones. He declines referral to specialist.  - Reports he is on 2 liters of oxygen  only at home as needed. He denies red flag symptoms associated with breathing.  - Intermittent nonproductive cough. Denies red flag symptoms.  - No further issues/concerns for discussion today.  ROS per HPI    Health Maintenance:  Health Maintenance Due  Topic Date Due   Medicare Annual Wellness (AWV)  Never done   FOOT EXAM  Never done   OPHTHALMOLOGY EXAM  Never done   Hepatitis C Screening  Never done   DTaP/Tdap/Td (1 - Tdap) Never done   Pneumococcal Vaccine 31-88 Years old (1 of 2 - PCV) Never done   Hepatitis B Vaccines (1 of 3 - 19+ 3-dose series) Never done   Colonoscopy  Never done   Zoster Vaccines- Shingrix (1 of 2) Never done   COVID-19 Vaccine (3 - 2024-25 season) 11/22/2022     Past Medical History: Patient Active Problem List   Diagnosis Date Noted   Acute on chronic congestive heart failure (HCC) 06/06/2023   AKI (acute kidney injury) (HCC) 06/06/2023   Mitral regurgitation 12/11/2021   HTN (hypertension) 06/03/2021   Acute kidney injury superimposed on chronic kidney disease (HCC) 06/03/2021   Type 2 diabetes mellitus (HCC) 06/03/2021   Osteogenesis imperfecta 06/03/2021   Class 3 obesity 06/03/2021   Encounter for psychological evaluation 03/08/2021   Chest pain 02/08/2021   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   Nutritional counseling 04/27/2018   Essential hypertension 09/20/2017   EKG abnormalities    Atypical chest pain 10/12/2016   Morbid obesity (HCC) 06/08/2016   Acute on chronic respiratory failure with hypoxia (HCC) 03/05/2016   Obesity hypoventilation syndrome (HCC) 02/29/2016   Obstructive sleep apnea  02/29/2016   Respiratory acidosis    NICM (nonischemic cardiomyopathy) (HCC) 02/22/2016   Chronic systolic heart failure (HCC) 05/31/2015   Hypertensive heart disease 05/01/2015   Elevated troponin 03/28/2015   Acute on chronic systolic heart failure (HCC) 04/08/2013      Social History   reports that he has quit smoking. He has never used smokeless tobacco. He reports current alcohol use. He reports current drug use. Drug: Marijuana.   Family History  family history includes Cancer in his father; Diabetes in his brother, maternal aunt, and maternal grandmother; Hypertension in his mother; Lung cancer in his father; Stroke in his brother and maternal grandmother.   Medications: reviewed and updated   Objective:   Physical Exam BP 123/74 (BP Location: Right Arm, Patient Position: Sitting, Cuff Size: Normal)   Pulse 83   SpO2 (!) 87%   Physical Exam HENT:     Head: Normocephalic and atraumatic.     Nose: Nose normal.     Mouth/Throat:     Mouth: Mucous membranes are moist.     Pharynx: Oropharynx is clear.  Eyes:     Extraocular Movements: Extraocular movements intact.     Conjunctiva/sclera: Conjunctivae normal.     Pupils: Pupils are equal, round, and reactive to light.  Cardiovascular:     Rate and Rhythm: Normal rate and regular rhythm.     Pulses: Normal pulses.     Heart sounds: Normal heart sounds.  Pulmonary:     Effort:  Pulmonary effort is normal.     Breath sounds: Normal breath sounds.  Musculoskeletal:        General: Normal range of motion.     Cervical back: Normal range of motion and neck supple.  Neurological:     General: No focal deficit present.     Mental Status: He is alert and oriented to person, place, and time.  Psychiatric:        Mood and Affect: Mood normal.        Behavior: Behavior normal.       Assessment & Plan:  1. Encounter to establish care (Primary) - Patient presents today to establish care. During the interim follow-up with  primary provider as scheduled.  - Return for annual physical examination, labs, and health maintenance. Arrive fasting meaning having no food for at least 8 hours prior to appointment. You may have only water  or black coffee. Please take scheduled medications as normal.  2. Cough, unspecified type - Patient today in office with no cardiopulmonary/acute distress.  - Patient declined respiratory panel, imaging, and referral to specialist.  - Benzonatate  and Brompheniramine-Pseudoephedrine-DM as prescribed. Counseled on medication adherence/adverse effects.  - Follow-up with primary provider as scheduled. - benzonatate  (TESSALON  PERLES) 100 MG capsule; Take 1 capsule (100 mg total) by mouth 3 (three) times daily as needed.  Dispense: 90 capsule; Refill: 0 - brompheniramine-pseudoephedrine-DM 30-2-10 MG/5ML syrup; Take 5 mLs by mouth 4 (four) times daily as needed.  Dispense: 120 mL; Refill: 0    Patient was given clear instructions to go to Emergency Department or return to medical center if symptoms don't improve, worsen, or new problems develop.The patient verbalized understanding.  I discussed the assessment and treatment plan with the patient. The patient was provided an opportunity to ask questions and all were answered. The patient agreed with the plan and demonstrated an understanding of the instructions.   The patient was advised to call back or seek an in-person evaluation if the symptoms worsen or if the condition fails to improve as anticipated.    Greig Drones, NP 09/23/2023, 10:32 AM Primary Care at [facility]

## 2023-09-23 NOTE — Progress Notes (Signed)
 2L of oxygen  at home

## 2023-09-24 DIAGNOSIS — N2581 Secondary hyperparathyroidism of renal origin: Secondary | ICD-10-CM | POA: Diagnosis not present

## 2023-09-24 DIAGNOSIS — Z992 Dependence on renal dialysis: Secondary | ICD-10-CM | POA: Diagnosis not present

## 2023-09-24 DIAGNOSIS — N186 End stage renal disease: Secondary | ICD-10-CM | POA: Diagnosis not present

## 2023-09-24 DIAGNOSIS — D689 Coagulation defect, unspecified: Secondary | ICD-10-CM | POA: Diagnosis not present

## 2023-09-24 DIAGNOSIS — D631 Anemia in chronic kidney disease: Secondary | ICD-10-CM | POA: Diagnosis not present

## 2023-09-26 DIAGNOSIS — G4733 Obstructive sleep apnea (adult) (pediatric): Secondary | ICD-10-CM | POA: Diagnosis not present

## 2023-09-27 DIAGNOSIS — Z992 Dependence on renal dialysis: Secondary | ICD-10-CM | POA: Diagnosis not present

## 2023-09-27 DIAGNOSIS — N2581 Secondary hyperparathyroidism of renal origin: Secondary | ICD-10-CM | POA: Diagnosis not present

## 2023-09-27 DIAGNOSIS — N186 End stage renal disease: Secondary | ICD-10-CM | POA: Diagnosis not present

## 2023-09-27 DIAGNOSIS — D631 Anemia in chronic kidney disease: Secondary | ICD-10-CM | POA: Diagnosis not present

## 2023-09-27 DIAGNOSIS — G4733 Obstructive sleep apnea (adult) (pediatric): Secondary | ICD-10-CM | POA: Diagnosis not present

## 2023-09-27 DIAGNOSIS — D689 Coagulation defect, unspecified: Secondary | ICD-10-CM | POA: Diagnosis not present

## 2023-09-29 DIAGNOSIS — Z992 Dependence on renal dialysis: Secondary | ICD-10-CM | POA: Diagnosis not present

## 2023-09-29 DIAGNOSIS — D631 Anemia in chronic kidney disease: Secondary | ICD-10-CM | POA: Diagnosis not present

## 2023-09-29 DIAGNOSIS — D689 Coagulation defect, unspecified: Secondary | ICD-10-CM | POA: Diagnosis not present

## 2023-09-29 DIAGNOSIS — N186 End stage renal disease: Secondary | ICD-10-CM | POA: Diagnosis not present

## 2023-09-29 DIAGNOSIS — N2581 Secondary hyperparathyroidism of renal origin: Secondary | ICD-10-CM | POA: Diagnosis not present

## 2023-10-01 DIAGNOSIS — N2581 Secondary hyperparathyroidism of renal origin: Secondary | ICD-10-CM | POA: Diagnosis not present

## 2023-10-01 DIAGNOSIS — Z992 Dependence on renal dialysis: Secondary | ICD-10-CM | POA: Diagnosis not present

## 2023-10-01 DIAGNOSIS — D689 Coagulation defect, unspecified: Secondary | ICD-10-CM | POA: Diagnosis not present

## 2023-10-01 DIAGNOSIS — N186 End stage renal disease: Secondary | ICD-10-CM | POA: Diagnosis not present

## 2023-10-01 DIAGNOSIS — D631 Anemia in chronic kidney disease: Secondary | ICD-10-CM | POA: Diagnosis not present

## 2023-10-04 DIAGNOSIS — N2581 Secondary hyperparathyroidism of renal origin: Secondary | ICD-10-CM | POA: Diagnosis not present

## 2023-10-04 DIAGNOSIS — Z992 Dependence on renal dialysis: Secondary | ICD-10-CM | POA: Diagnosis not present

## 2023-10-04 DIAGNOSIS — N186 End stage renal disease: Secondary | ICD-10-CM | POA: Diagnosis not present

## 2023-10-04 DIAGNOSIS — D631 Anemia in chronic kidney disease: Secondary | ICD-10-CM | POA: Diagnosis not present

## 2023-10-04 DIAGNOSIS — D689 Coagulation defect, unspecified: Secondary | ICD-10-CM | POA: Diagnosis not present

## 2023-10-05 ENCOUNTER — Ambulatory Visit (INDEPENDENT_AMBULATORY_CARE_PROVIDER_SITE_OTHER)

## 2023-10-05 VITALS — BP 120/74 | HR 73 | Temp 99.1°F | Resp 16 | Ht 59.0 in | Wt 178.8 lb

## 2023-10-05 DIAGNOSIS — D689 Coagulation defect, unspecified: Secondary | ICD-10-CM | POA: Diagnosis not present

## 2023-10-05 DIAGNOSIS — Z992 Dependence on renal dialysis: Secondary | ICD-10-CM | POA: Diagnosis not present

## 2023-10-05 DIAGNOSIS — I5022 Chronic systolic (congestive) heart failure: Secondary | ICD-10-CM | POA: Diagnosis not present

## 2023-10-05 DIAGNOSIS — N186 End stage renal disease: Secondary | ICD-10-CM | POA: Diagnosis not present

## 2023-10-05 DIAGNOSIS — N189 Chronic kidney disease, unspecified: Secondary | ICD-10-CM | POA: Diagnosis not present

## 2023-10-05 DIAGNOSIS — Z1211 Encounter for screening for malignant neoplasm of colon: Secondary | ICD-10-CM

## 2023-10-05 DIAGNOSIS — Z1159 Encounter for screening for other viral diseases: Secondary | ICD-10-CM | POA: Diagnosis not present

## 2023-10-05 DIAGNOSIS — Z131 Encounter for screening for diabetes mellitus: Secondary | ICD-10-CM | POA: Diagnosis not present

## 2023-10-05 DIAGNOSIS — Z87898 Personal history of other specified conditions: Secondary | ICD-10-CM | POA: Diagnosis not present

## 2023-10-05 DIAGNOSIS — Z13228 Encounter for screening for other metabolic disorders: Secondary | ICD-10-CM | POA: Diagnosis not present

## 2023-10-05 DIAGNOSIS — N179 Acute kidney failure, unspecified: Secondary | ICD-10-CM | POA: Diagnosis not present

## 2023-10-05 DIAGNOSIS — Z23 Encounter for immunization: Secondary | ICD-10-CM | POA: Diagnosis not present

## 2023-10-05 DIAGNOSIS — I10 Essential (primary) hypertension: Secondary | ICD-10-CM | POA: Diagnosis not present

## 2023-10-05 DIAGNOSIS — D631 Anemia in chronic kidney disease: Secondary | ICD-10-CM | POA: Diagnosis not present

## 2023-10-05 DIAGNOSIS — Z130 Encounter for screening for diseases of the blood and blood-forming organs and certain disorders involving the immune mechanism: Secondary | ICD-10-CM | POA: Diagnosis not present

## 2023-10-05 DIAGNOSIS — Z0000 Encounter for general adult medical examination without abnormal findings: Secondary | ICD-10-CM | POA: Diagnosis not present

## 2023-10-05 DIAGNOSIS — Z13220 Encounter for screening for lipoid disorders: Secondary | ICD-10-CM | POA: Diagnosis not present

## 2023-10-05 DIAGNOSIS — N2581 Secondary hyperparathyroidism of renal origin: Secondary | ICD-10-CM | POA: Diagnosis not present

## 2023-10-05 NOTE — Progress Notes (Unsigned)
 error

## 2023-10-05 NOTE — Progress Notes (Unsigned)
 Patient ID: Jerry Edwards, male    DOB: 09/01/1970  MRN: [id number]  CC: Annual Exam (-Patient is here to have annually  complete physical examination /-Care gap address/-labs taken /)   Subjective: Jerry Edwards is a 53 y.o. male who presents for His concerns today include: *** Patient wears a CPAP at night for his sleep apnea. Reporting 1 month history of dry cough nonproductive denies fever, chest pain or shortness of breath.    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   Nutritional counseling 04/27/2018   Essential hypertension 09/20/2017   EKG abnormalities    Atypical chest pain 10/12/2016   Morbid obesity (HCC) 06/08/2016   Acute on chronic respiratory failure with hypoxia (HCC) 03/05/2016   Obesity hypoventilation syndrome (HCC) 02/29/2016   Obstructive sleep apnea 02/29/2016   Respiratory acidosis    NICM (nonischemic cardiomyopathy) (HCC) 02/22/2016   Chronic systolic heart failure (HCC) 05/31/2015   Hypertensive heart disease 05/01/2015   Elevated troponin 03/28/2015   Acute on chronic systolic heart failure (HCC) 04/08/2013     Current Outpatient Medications on File Prior to Visit  Medication Sig Dispense Refill   aspirin  81 MG tablet Take 1 tablet (81 mg total) by mouth daily.     benzonatate  (TESSALON  PERLES) 100 MG capsule Take 1 capsule (100 mg total) by mouth 3 (three) times daily as needed. 90 capsule 0   brompheniramine-pseudoephedrine-DM 30-2-10 MG/5ML syrup Take 5 mLs by mouth 4 (four) times daily as needed.  120 mL 0   carvedilol  (COREG ) 3.125 MG tablet Take 1 tablet (3.125 mg total) by mouth 2 (two) times daily with a meal. 60 tablet 11   cyclobenzaprine  (FLEXERIL ) 5 MG tablet Take 1 tablet (5 mg total) by mouth 2 (two) times daily as needed for muscle spasms. 20 tablet 0   furosemide  (LASIX ) 80 MG tablet Take 80 mg on non dialysis days 45 tablet 3   Olopatadine  HCl (PATADAY  OP) Place 1 drop into both eyes in the morning and at bedtime.     OXYGEN  Inhale 2 L into the lungs as needed (shortness of breath).     polyethylene glycol powder (GLYCOLAX /MIRALAX ) 17 GM/SCOOP powder Take 1 capful (17 g) with water  by mouth daily as needed for constipation. 238 g 0   sevelamer  carbonate (RENVELA ) 800 MG tablet Take 2 tablets (1,600 mg total) by mouth 3 (three) times daily with meals. 90 tablet 0   traMADol  (ULTRAM ) 50 MG tablet Take 1 tablet (50 mg total) by mouth every 8 (eight) hours as needed for severe pain (pain score 7-10). 30 tablet 0   No current facility-administered medications on file prior to visit.    Allergies  Allergen Reactions   Fish Allergy Anaphylaxis   Other Anaphylaxis and Other (See Comments)    NO NUTS!!!!!! Peanut are legumes!!   No red meat- Does not eat   Peanuts [Peanut Oil] Anaphylaxis   Shellfish Allergy Anaphylaxis   Beef-Derived Drug Products    Fish-Derived Products    Latex Itching   Pork-Derived Products Other (See Comments)  Does not eat- religious reasons    Social History   Socioeconomic History   Marital status: Single    Spouse name: Not on file   Number of children: Not on file   Years of education: Not on file   Highest education level: 12th grade  Occupational History   Not on file  Tobacco Use   Smoking status: Former   Smokeless tobacco: Never  Vaping Use   Vaping status: Never Used  Substance and Sexual Activity   Alcohol use: Yes    Comment: occasionally   Drug use: Yes    Types: Marijuana    Comment: occasional marijuana -  02/23/2012   Sexual activity: Never    Birth control/protection: None  Other Topics Concern   Not on file  Social History Narrative   Not on file   Social Drivers of Health   Financial Resource Strain: Low Risk  (09/19/2023)   Overall Financial Resource Strain (CARDIA)    Difficulty of Paying Living Expenses: Not hard at all  Food Insecurity: No Food Insecurity (09/19/2023)   Hunger Vital Sign    Worried About Running Out of Food in the Last Year: Never true    Ran Out of Food in the Last Year: Never true  Transportation Needs: No Transportation Needs (09/19/2023)   PRAPARE - Administrator, Civil Service (Medical): No    Lack of Transportation (Non-Medical): No  Physical Activity: Inactive (09/19/2023)   Exercise Vital Sign    Days of Exercise per Week: 0 days    Minutes of Exercise per Session: Not on file  Stress: No Stress Concern Present (09/19/2023)   [employer] of Occupational Health - Occupational Stress Questionnaire    Feeling of Stress: Not at all  Social Connections: Moderately Isolated (09/19/2023)   Social Connection and Isolation Panel    Frequency of Communication with Friends and Family: More than three times a week    Frequency of Social Gatherings with Friends and Family: Once a week    Attends Religious Services: 1 to 4 times per year    Active Member of [employer] or Organizations: No    Attends Engineer, structural: Not on file    Marital Status: Never married  Intimate Partner Violence: Not At Risk (06/06/2023)   Humiliation, Afraid, Rape, and Kick questionnaire    Fear of Current or Ex-Partner: No    Emotionally Abused: No    Physically Abused: No    Sexually Abused: No    Family History  Problem Relation Age of Onset   Hypertension Mother    Lung cancer Father    Cancer Father    Stroke Brother    Stroke Maternal Grandmother    Diabetes Maternal Grandmother    Diabetes Brother    Diabetes Maternal Aunt    Heart attack Neg Hx      Past Surgical History:  Procedure Laterality Date   AV FISTULA PLACEMENT Left 06/16/2023   Procedure: LEFT BRACHIOCEPHALIC ARTERIOVENOUS (AV) FISTULA CREATION;  Surgeon: Gretta Lonni PARAS, MD;  Location: [facility] OR;  Service: Vascular;  Laterality: Left;   CHOLECYSTECTOMY     IR FLUORO GUIDE CV LINE RIGHT  06/08/2023   IR US  GUIDE VASC ACCESS RIGHT  06/08/2023   LEFT AND RIGHT HEART CATHETERIZATION WITH CORONARY ANGIOGRAM N/A 04/12/2013   Procedure: LEFT AND RIGHT HEART CATHETERIZATION WITH CORONARY ANGIOGRAM;  Surgeon: Deatrice DELENA Cage, MD;  Location: [facility] CATH LAB;  Service: Cardiovascular;  Laterality: N/A;  LEG SURGERY     rods in both legs   RIGHT HEART CATH N/A 06/04/2021   Procedure: RIGHT HEART CATH;  Surgeon: Cherrie Toribio SAUNDERS, MD;  Location: [facility] INVASIVE CV LAB;  Service: Cardiovascular;  Laterality: N/A;   TEE WITHOUT CARDIOVERSION N/A 06/05/2021   Procedure: TRANSESOPHAGEAL ECHOCARDIOGRAM (TEE);  Surgeon: Cherrie Toribio SAUNDERS, MD;  Location: [facility] ENDOSCOPY;  Service: Cardiovascular;  Laterality: N/A;   VASCULAR SURGERY      ROS: Review of Systems Negative except as stated above  PHYSICAL EXAM: BP 120/74   Pulse 73   Temp 99.1 F (37.3 C) (Oral)   Resp 16   Ht 4' 11 (1.499 m)   Wt 178 lb 12.8 oz (81.1 kg)   SpO2 (!) 87% Comment: per pt he use O2 at times  BMI 36.11 kg/m   Physical Exam  {male adult master:310786} {male adult master:310785}     Latest Ref Rng & Units 06/18/2023    3:12 AM 06/17/2023    3:00 AM 06/16/2023    2:47 AM  CMP  Glucose 70 - 99 mg/dL 893  888  79   BUN 6 - 20 mg/dL 67  39  61   Creatinine 0.61 - 1.24 mg/dL 2.51  4.58  2.82   Sodium 135 - 145 mmol/L 136  134  136   Potassium 3.5 - 5.1 mmol/L 4.3  4.4  4.7   Chloride 98 - 111 mmol/L 98  96  99   CO2 22 - 32 mmol/L 24  29  24    Calcium  8.9 - 10.3 mg/dL 7.9  8.0  8.2    Lipid Panel     Component Value Date/Time   CHOL 145 02/08/2021 1754   TRIG 131 02/08/2021 1754   HDL 37  (L) 02/08/2021 1754   CHOLHDL 3.9 02/08/2021 1754   VLDL 26 02/08/2021 1754   LDLCALC 82 02/08/2021 1754    CBC    Component Value Date/Time   WBC 8.1 06/18/2023 0800   RBC 2.84 (L) 06/18/2023 0800   HGB 8.4 (L) 06/18/2023 0800   HCT 28.7 (L) 06/18/2023 0800   PLT 212 06/18/2023 0800   MCV 101.1 (H) 06/18/2023 0800   MCH 29.6 06/18/2023 0800   MCHC 29.3 (L) 06/18/2023 0800   RDW 15.9 (H) 06/18/2023 0800   LYMPHSABS 1.4 02/02/2022 1857   MONOABS 1.3 (H) 02/02/2022 1857   EOSABS 0.1 02/02/2022 1857   BASOSABS 0.0 02/02/2022 1857    ASSESSMENT AND PLAN:  There are no diagnoses linked to this encounter.   Patient was given the opportunity to ask questions.  Patient verbalized understanding of the plan and was able to repeat key elements of the plan.   Orders Placed This Encounter  Procedures   Pneumococcal conjugate vaccine 20-valent   Tdap vaccine greater than or equal to 7yo IM   PSA   Lipid panel   Comprehensive metabolic panel with GFR   CBC   Hemoglobin A1c     Requested Prescriptions    No prescriptions requested or ordered in this encounter    Return in about 6 months (around 04/06/2024) for Follow up.  Sula Leavy Rode, PA-C

## 2023-10-06 ENCOUNTER — Ambulatory Visit: Payer: Self-pay | Admitting: *Deleted

## 2023-10-06 DIAGNOSIS — Z992 Dependence on renal dialysis: Secondary | ICD-10-CM | POA: Diagnosis not present

## 2023-10-06 DIAGNOSIS — N186 End stage renal disease: Secondary | ICD-10-CM | POA: Diagnosis not present

## 2023-10-06 DIAGNOSIS — D631 Anemia in chronic kidney disease: Secondary | ICD-10-CM | POA: Diagnosis not present

## 2023-10-06 DIAGNOSIS — N2581 Secondary hyperparathyroidism of renal origin: Secondary | ICD-10-CM | POA: Diagnosis not present

## 2023-10-06 DIAGNOSIS — D689 Coagulation defect, unspecified: Secondary | ICD-10-CM | POA: Diagnosis not present

## 2023-10-06 LAB — LIPID PANEL
Chol/HDL Ratio: 2.7 ratio (ref 0.0–5.0)
Cholesterol, Total: 75 mg/dL — ABNORMAL LOW (ref 100–199)
HDL: 28 mg/dL — ABNORMAL LOW (ref >39)
LDL Chol Calc (NIH): 30 mg/dL (ref 0–99)
Triglycerides: 82 mg/dL (ref 0–149)
VLDL Cholesterol Cal: 17 mg/dL (ref 5–40)

## 2023-10-06 LAB — COMPREHENSIVE METABOLIC PANEL WITH GFR
ALT: 17 IU/L (ref 0–44)
AST: 22 IU/L (ref 0–40)
Albumin: 3.5 g/dL — ABNORMAL LOW (ref 3.8–4.9)
Alkaline Phosphatase: 90 IU/L (ref 44–121)
BUN/Creatinine Ratio: 8 — ABNORMAL LOW (ref 9–20)
BUN: 34 mg/dL — ABNORMAL HIGH (ref 6–24)
Bilirubin Total: 1 mg/dL (ref 0.0–1.2)
CO2: 24 mmol/L (ref 20–29)
Calcium: 9.1 mg/dL (ref 8.7–10.2)
Chloride: 99 mmol/L (ref 96–106)
Creatinine, Ser: 4.36 mg/dL — ABNORMAL HIGH (ref 0.76–1.27)
Globulin, Total: 3.3 g/dL (ref 1.5–4.5)
Glucose: 87 mg/dL (ref 70–99)
Potassium: 3.7 mmol/L (ref 3.5–5.2)
Sodium: 141 mmol/L (ref 134–144)
Total Protein: 6.8 g/dL (ref 6.0–8.5)
eGFR: 15 mL/min/1.73 — ABNORMAL LOW (ref >59)

## 2023-10-06 LAB — CBC
Hematocrit: 38.3 % (ref 37.5–51.0)
Hemoglobin: 11.8 g/dL — ABNORMAL LOW (ref 13.0–17.7)
MCH: 29.7 pg (ref 26.6–33.0)
MCHC: 30.8 g/dL — ABNORMAL LOW (ref 31.5–35.7)
MCV: 97 fL (ref 79–97)
NRBC: 1 % — ABNORMAL HIGH (ref 0–0)
Platelets: 166 x10E3/uL (ref 150–450)
RBC: 3.97 x10E6/uL — ABNORMAL LOW (ref 4.14–5.80)
RDW: 14.2 % (ref 11.6–15.4)
WBC: 4.4 x10E3/uL (ref 3.4–10.8)

## 2023-10-06 LAB — HEPATITIS C ANTIBODY: Hep C Virus Ab: REACTIVE — AB

## 2023-10-06 LAB — HEMOGLOBIN A1C
Est. average glucose Bld gHb Est-mCnc: 91 mg/dL
Hgb A1c MFr Bld: 4.8 % (ref 4.8–5.6)

## 2023-10-06 LAB — PSA: Prostate Specific Ag, Serum: 0.5 ng/mL (ref 0.0–4.0)

## 2023-10-07 ENCOUNTER — Telehealth: Payer: Self-pay

## 2023-10-07 NOTE — Telephone Encounter (Signed)
 recieved

## 2023-10-07 NOTE — Telephone Encounter (Signed)
 A document form from [facility] has been faxed: VERBAL ORDER, to be filled out by provider. Send document back via Fax within 7-days. Document is located in providers tray at front office.          Fax number: [phone]

## 2023-10-08 DIAGNOSIS — D689 Coagulation defect, unspecified: Secondary | ICD-10-CM | POA: Diagnosis not present

## 2023-10-08 DIAGNOSIS — Z992 Dependence on renal dialysis: Secondary | ICD-10-CM | POA: Diagnosis not present

## 2023-10-08 DIAGNOSIS — N2581 Secondary hyperparathyroidism of renal origin: Secondary | ICD-10-CM | POA: Diagnosis not present

## 2023-10-08 DIAGNOSIS — N186 End stage renal disease: Secondary | ICD-10-CM | POA: Diagnosis not present

## 2023-10-08 DIAGNOSIS — D631 Anemia in chronic kidney disease: Secondary | ICD-10-CM | POA: Diagnosis not present

## 2023-10-08 LAB — HCV RNA QUANT
HCV log10: 5.576 {Log_IU}/mL
Hepatitis C Quantitation: 377000 [IU]/mL

## 2023-10-08 LAB — SPECIMEN STATUS REPORT

## 2023-10-11 DIAGNOSIS — D689 Coagulation defect, unspecified: Secondary | ICD-10-CM | POA: Diagnosis not present

## 2023-10-11 DIAGNOSIS — Z992 Dependence on renal dialysis: Secondary | ICD-10-CM | POA: Diagnosis not present

## 2023-10-11 DIAGNOSIS — D631 Anemia in chronic kidney disease: Secondary | ICD-10-CM | POA: Diagnosis not present

## 2023-10-11 DIAGNOSIS — N186 End stage renal disease: Secondary | ICD-10-CM | POA: Diagnosis not present

## 2023-10-11 DIAGNOSIS — N2581 Secondary hyperparathyroidism of renal origin: Secondary | ICD-10-CM | POA: Diagnosis not present

## 2023-10-13 DIAGNOSIS — D689 Coagulation defect, unspecified: Secondary | ICD-10-CM | POA: Diagnosis not present

## 2023-10-13 DIAGNOSIS — N186 End stage renal disease: Secondary | ICD-10-CM | POA: Diagnosis not present

## 2023-10-13 DIAGNOSIS — N2581 Secondary hyperparathyroidism of renal origin: Secondary | ICD-10-CM | POA: Diagnosis not present

## 2023-10-13 DIAGNOSIS — D631 Anemia in chronic kidney disease: Secondary | ICD-10-CM | POA: Diagnosis not present

## 2023-10-13 DIAGNOSIS — Z992 Dependence on renal dialysis: Secondary | ICD-10-CM | POA: Diagnosis not present

## 2023-10-15 ENCOUNTER — Telehealth: Payer: Self-pay

## 2023-10-15 DIAGNOSIS — G4733 Obstructive sleep apnea (adult) (pediatric): Secondary | ICD-10-CM | POA: Diagnosis not present

## 2023-10-15 DIAGNOSIS — N2581 Secondary hyperparathyroidism of renal origin: Secondary | ICD-10-CM | POA: Diagnosis not present

## 2023-10-15 DIAGNOSIS — I5022 Chronic systolic (congestive) heart failure: Secondary | ICD-10-CM | POA: Diagnosis not present

## 2023-10-15 DIAGNOSIS — D631 Anemia in chronic kidney disease: Secondary | ICD-10-CM | POA: Diagnosis not present

## 2023-10-15 DIAGNOSIS — N186 End stage renal disease: Secondary | ICD-10-CM | POA: Diagnosis not present

## 2023-10-15 DIAGNOSIS — Z992 Dependence on renal dialysis: Secondary | ICD-10-CM | POA: Diagnosis not present

## 2023-10-15 DIAGNOSIS — D689 Coagulation defect, unspecified: Secondary | ICD-10-CM | POA: Diagnosis not present

## 2023-10-15 DIAGNOSIS — R2689 Other abnormalities of gait and mobility: Secondary | ICD-10-CM | POA: Diagnosis not present

## 2023-10-15 NOTE — Telephone Encounter (Signed)
 Copied from CRM [id number]. Topic: Clinical - Lab/Test Results >> Oct 15, 2023  4:18 PM Ivette P wrote: Reason for CRM: Pt received letter in the mail about his most reent lab results and wanted to speak to nurse Gustav, called [facility] 3 times no answer.    Pls follow up with pt [phone]

## 2023-10-18 ENCOUNTER — Other Ambulatory Visit: Payer: Self-pay

## 2023-10-18 ENCOUNTER — Telehealth: Payer: Self-pay

## 2023-10-18 DIAGNOSIS — Z992 Dependence on renal dialysis: Secondary | ICD-10-CM | POA: Diagnosis not present

## 2023-10-18 DIAGNOSIS — N186 End stage renal disease: Secondary | ICD-10-CM | POA: Diagnosis not present

## 2023-10-18 DIAGNOSIS — R768 Other specified abnormal immunological findings in serum: Secondary | ICD-10-CM

## 2023-10-18 DIAGNOSIS — D689 Coagulation defect, unspecified: Secondary | ICD-10-CM | POA: Diagnosis not present

## 2023-10-18 DIAGNOSIS — D631 Anemia in chronic kidney disease: Secondary | ICD-10-CM | POA: Diagnosis not present

## 2023-10-18 DIAGNOSIS — N2581 Secondary hyperparathyroidism of renal origin: Secondary | ICD-10-CM | POA: Diagnosis not present

## 2023-10-18 NOTE — Telephone Encounter (Signed)
 Spoke with patient and he is aware of results and referral. Nothing further needed at this time.

## 2023-10-18 NOTE — Telephone Encounter (Signed)
 Copied from CRM [id number]. Topic: General - Other >> Oct 18, 2023 12:22 PM Everette C wrote: Reason for CRM: The patient has called to request contact with ASABRA Bohr, RMA when possible. Please contact further if/when available

## 2023-10-18 NOTE — Progress Notes (Signed)
 I have placed a referral to Hepatology to discuss medication management for Hepatitis C given positive HepC screen + Low viral load on HepC Quant test result.

## 2023-10-19 ENCOUNTER — Other Ambulatory Visit: Payer: Self-pay

## 2023-10-19 NOTE — Telephone Encounter (Signed)
 Concern address  by  Lovenia Brisker, CMA

## 2023-10-19 NOTE — Patient Instructions (Signed)
 Visit Information  Thank you for taking time to visit with me today. Please don't hesitate to contact me if I can be of assistance to you before our next scheduled appointment.  Your next care management appointment is by telephone on Tuesday, September 23rd at 1:00pm.    Please call the care guide team at [phone] if you need to cancel, schedule, or reschedule an appointment.   A reminder to ALL patients/family/friends, please call the [place]  National Suicide Prevention Lifeline: (605)832-3508 or TTY: 310 876 5492 TTY (765)278-4874) to talk to a trained counselor if you are experiencing a Mental Health or Behavioral Health Crisis or need someone to talk to.  Santana Stamp BSN, CCM [place]  VBCI Population Health RN Care Manager Direct Dial: [phone]  Fax: [phone]
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
## 2023-10-20 DIAGNOSIS — Z992 Dependence on renal dialysis: Secondary | ICD-10-CM | POA: Diagnosis not present

## 2023-10-20 DIAGNOSIS — D631 Anemia in chronic kidney disease: Secondary | ICD-10-CM | POA: Diagnosis not present

## 2023-10-20 DIAGNOSIS — N186 End stage renal disease: Secondary | ICD-10-CM | POA: Diagnosis not present

## 2023-10-20 DIAGNOSIS — D689 Coagulation defect, unspecified: Secondary | ICD-10-CM | POA: Diagnosis not present

## 2023-10-20 DIAGNOSIS — N2581 Secondary hyperparathyroidism of renal origin: Secondary | ICD-10-CM | POA: Diagnosis not present

## 2023-10-21 DIAGNOSIS — I129 Hypertensive chronic kidney disease with stage 1 through stage 4 chronic kidney disease, or unspecified chronic kidney disease: Secondary | ICD-10-CM | POA: Diagnosis not present

## 2023-10-21 DIAGNOSIS — N186 End stage renal disease: Secondary | ICD-10-CM | POA: Diagnosis not present

## 2023-10-21 DIAGNOSIS — Z992 Dependence on renal dialysis: Secondary | ICD-10-CM | POA: Diagnosis not present

## 2023-10-22 DIAGNOSIS — D689 Coagulation defect, unspecified: Secondary | ICD-10-CM | POA: Diagnosis not present

## 2023-10-22 DIAGNOSIS — N186 End stage renal disease: Secondary | ICD-10-CM | POA: Diagnosis not present

## 2023-10-22 DIAGNOSIS — N2581 Secondary hyperparathyroidism of renal origin: Secondary | ICD-10-CM | POA: Diagnosis not present

## 2023-10-22 DIAGNOSIS — D631 Anemia in chronic kidney disease: Secondary | ICD-10-CM | POA: Diagnosis not present

## 2023-10-22 DIAGNOSIS — Z992 Dependence on renal dialysis: Secondary | ICD-10-CM | POA: Diagnosis not present

## 2023-10-26 ENCOUNTER — Other Ambulatory Visit (HOSPITAL_COMMUNITY): Payer: Self-pay

## 2023-10-26 ENCOUNTER — Telehealth: Payer: Self-pay

## 2023-10-26 NOTE — Telephone Encounter (Signed)
 Pharmacy Patient Advocate Encounter  Insurance verification completed.   The patient is insured through [employer] claim for [employer]. Currently a quantity of 84 is a 28 day supply and PA is required & [facility] is the Preferred .   This test claim was processed through [facility]- copay amounts may vary at other pharmacies due to pharmacy/plan contracts, or as the patient moves through the different stages of their insurance plan.

## 2023-10-28 DIAGNOSIS — G4733 Obstructive sleep apnea (adult) (pediatric): Secondary | ICD-10-CM | POA: Diagnosis not present

## 2023-10-28 DIAGNOSIS — Z1211 Encounter for screening for malignant neoplasm of colon: Secondary | ICD-10-CM | POA: Diagnosis not present

## 2023-10-28 DIAGNOSIS — Z1212 Encounter for screening for malignant neoplasm of rectum: Secondary | ICD-10-CM | POA: Diagnosis not present

## 2023-10-29 DIAGNOSIS — N186 End stage renal disease: Secondary | ICD-10-CM | POA: Diagnosis not present

## 2023-10-29 DIAGNOSIS — D631 Anemia in chronic kidney disease: Secondary | ICD-10-CM | POA: Diagnosis not present

## 2023-10-29 DIAGNOSIS — Z992 Dependence on renal dialysis: Secondary | ICD-10-CM | POA: Diagnosis not present

## 2023-10-29 DIAGNOSIS — D689 Coagulation defect, unspecified: Secondary | ICD-10-CM | POA: Diagnosis not present

## 2023-11-01 DIAGNOSIS — D689 Coagulation defect, unspecified: Secondary | ICD-10-CM | POA: Diagnosis not present

## 2023-11-01 DIAGNOSIS — Z992 Dependence on renal dialysis: Secondary | ICD-10-CM | POA: Diagnosis not present

## 2023-11-01 DIAGNOSIS — N186 End stage renal disease: Secondary | ICD-10-CM | POA: Diagnosis not present

## 2023-11-01 DIAGNOSIS — N2581 Secondary hyperparathyroidism of renal origin: Secondary | ICD-10-CM | POA: Diagnosis not present

## 2023-11-02 ENCOUNTER — Other Ambulatory Visit: Payer: Self-pay

## 2023-11-02 ENCOUNTER — Encounter: Payer: Self-pay | Admitting: Family

## 2023-11-02 ENCOUNTER — Ambulatory Visit (INDEPENDENT_AMBULATORY_CARE_PROVIDER_SITE_OTHER): Admitting: Family

## 2023-11-02 VITALS — BP 116/76 | HR 74 | Temp 97.5°F | Ht <= 58 in | Wt 178.0 lb

## 2023-11-02 DIAGNOSIS — B182 Chronic viral hepatitis C: Secondary | ICD-10-CM | POA: Insufficient documentation

## 2023-11-02 NOTE — Assessment & Plan Note (Addendum)
 Jerry Edwards is a 53 y/o AA male with chronic Hepatitis C with positive antibody and initial RNA level of 337,000 with risk factor of tattoos and dialysis although no clear source. Treatment naive and asymptomatic. Discussed the basics of Hepatitis C including transmission, risk if left untreated, lab work, treatment options including side effects, available financial assistance, and plan of care. Check remaining blood work. Plan for treatment with Mavyret or [facility] pending lab work results. No medication adjustment is needed for dialysis.  Follow up in 1 month after start of medication or sooner if needed.
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
## 2023-11-02 NOTE — Patient Instructions (Signed)
 Nice to see you.  We will check your lab work today.  Plan for follow up in 1 month after start of medication or sooner if needed.   Have a great day and stay safe!  Limit acetaminophen  (Tylenol ) usage to no more than 2 grams (2,000 mg) per day.  Avoid alcohol.  Do not share toothbrushes or razors.  Practice safe sex to protect against transmission as well as sexually transmitted disease.    Hepatitis C Hepatitis C is a viral infection of the liver. It can lead to scarring of the liver (cirrhosis), liver failure, or liver cancer. Hepatitis C may go undetected for months or years because people with the infection may not have symptoms, or they may have only mild symptoms. What are the causes? This condition is caused by the hepatitis C virus (HCV). The virus can spread from person to person (is contagious) through: Blood. Childbirth. A woman who has hepatitis C can pass it to her baby during birth. Bodily fluids, such as breast milk, tears, semen, vaginal fluids, and saliva. Blood transfusions or organ transplants done in the [place]  before 1992.  What increases the risk? The following factors may make you more likely to develop this condition: Having contact with unclean (contaminated) needles or syringes. This may result from: Acupuncture. Tattoing. Body piercing. Injecting drugs. Having unprotected sex with someone who is infected. Needing treatment to filter your blood (kidney dialysis). Having HIV (human immunodeficiency virus) or AIDS (acquired immunodeficiency syndrome). Working in a job that involves contact with blood or bodily fluids, such as health care.  What are the signs or symptoms? Symptoms of this condition include: Fatigue. Loss of appetite. Nausea. Vomiting. Abdominal pain. Dark yellow urine. Yellowish skin and eyes (jaundice). Itchy skin. Clay-colored bowel movements. Joint pain. Bleeding and bruising easily. Fluid building up in your  stomach (ascites).  In some cases, you may not have any symptoms. How is this diagnosed? This condition is diagnosed with: Blood tests. Other tests to check how well your liver is functioning. They may include: Magnetic resonance elastography (MRE). This imaging test uses MRIs and sound waves to measure liver stiffness. Transient elastography. This imaging test uses ultrasounds to measure liver stiffness. Liver biopsy. This test requires taking a small tissue sample from your liver to examine it under a microscope.  How is this treated? Your health care provider may perform noninvasive tests or a liver biopsy to help decide the best course of treatment. Treatment may include: Antiviral medicines and other medicines. Follow-up treatments every 6-12 months for infections or other liver conditions. Receiving a donated liver (liver transplant).  Follow these instructions at home: Medicines Take over-the-counter and prescription medicines only as told by your health care provider. Take your antiviral medicine as told by your health care provider. Do not stop taking the antiviral even if you start to feel better. Do not take any medicines unless approved by your health care provider, including over-the-counter medicines and birth control pills. Activity Rest as needed. Do not have sex unless approved by your health care provider. Ask your health care provider when you may return to school or work. Eating and drinking Eat a balanced diet with plenty of fruits and vegetables, whole grains, and lowfat (lean) meats or non-meat proteins (such as beans or tofu). Drink enough fluids to keep your urine clear or pale yellow. Do not drink alcohol. General instructions Do not share toothbrushes, nail clippers, or razors. Wash your hands frequently with soap and water . If  soap and water  are not available, use hand sanitizer. Cover any cuts or open sores on your skin to prevent spreading the  virus. Keep all follow-up visits as told by your health care provider. This is important. You may need follow-up visits every 6-12 months. How is this prevented? There is no vaccine for hepatitis C. The only way to prevent the disease is to reduce the risk of exposure to the virus. Make sure you: Wash your hands frequently with soap and water . If soap and water  are not available, use hand sanitizer. Do not share needles or syringes. Practice safe sex and use condoms. Avoid handling blood or bodily fluids without gloves or other protection. Avoid getting tattoos or piercings in shops or other locations that are not clean.  Contact a health care provider if: You have a fever. You develop abdominal pain. You pass dark urine. You pass clay-colored stools. You develop joint pain. Get help right away if: You have increasing fatigue or weakness. You lose your appetite. You cannot eat or drink without vomiting. You develop jaundice or your jaundice gets worse. You bruise or bleed easily. Summary Hepatitis C is a viral infection of the liver. It can lead to scarring of the liver (cirrhosis), liver failure, or liver cancer. The hepatitis C virus (HCV) causes this condition. The virus can pass from person to person (is contagious). You should not take any medicines unless approved by your health care provider. This includes over-the-counter medicines and birth control pills. This information is not intended to replace advice given to you by your health care provider. Make sure you discuss any questions you have with your health care provider. Document Released: 03/06/2000 Document Revised: 04/14/2016 Document Reviewed: 04/14/2016 Elsevier Interactive Patient Education  [employer].

## 2023-11-05 ENCOUNTER — Ambulatory Visit: Payer: Self-pay | Admitting: Family

## 2023-11-05 DIAGNOSIS — D631 Anemia in chronic kidney disease: Secondary | ICD-10-CM | POA: Diagnosis not present

## 2023-11-05 DIAGNOSIS — Z992 Dependence on renal dialysis: Secondary | ICD-10-CM | POA: Diagnosis not present

## 2023-11-05 DIAGNOSIS — B182 Chronic viral hepatitis C: Secondary | ICD-10-CM

## 2023-11-05 DIAGNOSIS — N2581 Secondary hyperparathyroidism of renal origin: Secondary | ICD-10-CM | POA: Diagnosis not present

## 2023-11-05 DIAGNOSIS — D689 Coagulation defect, unspecified: Secondary | ICD-10-CM | POA: Diagnosis not present

## 2023-11-05 DIAGNOSIS — N186 End stage renal disease: Secondary | ICD-10-CM | POA: Diagnosis not present

## 2023-11-05 LAB — COLOGUARD: COLOGUARD: NEGATIVE

## 2023-11-06 LAB — HEPATIC FUNCTION PANEL
AG Ratio: 0.9 (calc) — ABNORMAL LOW (ref 1.0–2.5)
ALT: 23 U/L (ref 9–46)
AST: 30 U/L (ref 10–35)
Albumin: 3.4 g/dL — ABNORMAL LOW (ref 3.6–5.1)
Alkaline phosphatase (APISO): 77 U/L (ref 35–144)
Bilirubin, Direct: 0.7 mg/dL — ABNORMAL HIGH (ref 0.0–0.2)
Globulin: 3.6 g/dL (ref 1.9–3.7)
Indirect Bilirubin: 0.6 mg/dL (ref 0.2–1.2)
Total Bilirubin: 1.3 mg/dL — ABNORMAL HIGH (ref 0.2–1.2)
Total Protein: 7 g/dL (ref 6.1–8.1)

## 2023-11-06 LAB — LIVER FIBROSIS, FIBROTEST-ACTITEST
ALT: 22 U/L (ref 9–46)
Alpha-2-Macroglobulin: 179 mg/dL (ref 106–279)
Apolipoprotein A1: 90 mg/dL — ABNORMAL LOW (ref 94–176)
Bilirubin: 1.3 mg/dL — ABNORMAL HIGH (ref 0.2–1.2)
Fibrosis Score: 0.62
GGT: 44 U/L (ref 3–95)
Haptoglobin: 75 mg/dL (ref 43–212)
Necroinflammat ACT Score: 0.14
Reference ID: 5639054

## 2023-11-06 LAB — HEPATITIS C GENOTYPE

## 2023-11-06 LAB — PROTIME-INR
INR: 1.1
Prothrombin Time: 11.9 s — ABNORMAL HIGH (ref 9.0–11.5)

## 2023-11-12 DIAGNOSIS — N186 End stage renal disease: Secondary | ICD-10-CM | POA: Diagnosis not present

## 2023-11-12 DIAGNOSIS — Z992 Dependence on renal dialysis: Secondary | ICD-10-CM | POA: Diagnosis not present

## 2023-11-12 DIAGNOSIS — D689 Coagulation defect, unspecified: Secondary | ICD-10-CM | POA: Diagnosis not present

## 2023-11-12 DIAGNOSIS — N2581 Secondary hyperparathyroidism of renal origin: Secondary | ICD-10-CM | POA: Diagnosis not present

## 2023-11-15 DIAGNOSIS — R2689 Other abnormalities of gait and mobility: Secondary | ICD-10-CM | POA: Diagnosis not present

## 2023-11-15 DIAGNOSIS — G4733 Obstructive sleep apnea (adult) (pediatric): Secondary | ICD-10-CM | POA: Diagnosis not present

## 2023-11-15 DIAGNOSIS — D689 Coagulation defect, unspecified: Secondary | ICD-10-CM | POA: Diagnosis not present

## 2023-11-15 DIAGNOSIS — D631 Anemia in chronic kidney disease: Secondary | ICD-10-CM | POA: Diagnosis not present

## 2023-11-15 DIAGNOSIS — I5022 Chronic systolic (congestive) heart failure: Secondary | ICD-10-CM | POA: Diagnosis not present

## 2023-11-15 DIAGNOSIS — Z992 Dependence on renal dialysis: Secondary | ICD-10-CM | POA: Diagnosis not present

## 2023-11-15 DIAGNOSIS — N2581 Secondary hyperparathyroidism of renal origin: Secondary | ICD-10-CM | POA: Diagnosis not present

## 2023-11-19 DIAGNOSIS — Z992 Dependence on renal dialysis: Secondary | ICD-10-CM | POA: Diagnosis not present

## 2023-11-19 DIAGNOSIS — N186 End stage renal disease: Secondary | ICD-10-CM | POA: Diagnosis not present

## 2023-11-19 DIAGNOSIS — N2581 Secondary hyperparathyroidism of renal origin: Secondary | ICD-10-CM | POA: Diagnosis not present

## 2023-11-19 DIAGNOSIS — D689 Coagulation defect, unspecified: Secondary | ICD-10-CM | POA: Diagnosis not present

## 2023-11-19 DIAGNOSIS — D631 Anemia in chronic kidney disease: Secondary | ICD-10-CM | POA: Diagnosis not present

## 2023-11-21 DIAGNOSIS — N186 End stage renal disease: Secondary | ICD-10-CM | POA: Diagnosis not present

## 2023-11-21 DIAGNOSIS — I129 Hypertensive chronic kidney disease with stage 1 through stage 4 chronic kidney disease, or unspecified chronic kidney disease: Secondary | ICD-10-CM | POA: Diagnosis not present

## 2023-11-21 DIAGNOSIS — Z992 Dependence on renal dialysis: Secondary | ICD-10-CM | POA: Diagnosis not present

## 2023-11-22 DIAGNOSIS — T8249XD Other complication of vascular dialysis catheter, subsequent encounter: Secondary | ICD-10-CM | POA: Diagnosis not present

## 2023-11-22 DIAGNOSIS — N2581 Secondary hyperparathyroidism of renal origin: Secondary | ICD-10-CM | POA: Diagnosis not present

## 2023-11-22 DIAGNOSIS — Z992 Dependence on renal dialysis: Secondary | ICD-10-CM | POA: Diagnosis not present

## 2023-11-22 DIAGNOSIS — N186 End stage renal disease: Secondary | ICD-10-CM | POA: Diagnosis not present

## 2023-11-22 DIAGNOSIS — D631 Anemia in chronic kidney disease: Secondary | ICD-10-CM | POA: Diagnosis not present

## 2023-11-22 DIAGNOSIS — D689 Coagulation defect, unspecified: Secondary | ICD-10-CM | POA: Diagnosis not present

## 2023-11-24 DIAGNOSIS — T8249XD Other complication of vascular dialysis catheter, subsequent encounter: Secondary | ICD-10-CM | POA: Diagnosis not present

## 2023-11-24 DIAGNOSIS — Z992 Dependence on renal dialysis: Secondary | ICD-10-CM | POA: Diagnosis not present

## 2023-11-24 DIAGNOSIS — D689 Coagulation defect, unspecified: Secondary | ICD-10-CM | POA: Diagnosis not present

## 2023-11-24 DIAGNOSIS — D631 Anemia in chronic kidney disease: Secondary | ICD-10-CM | POA: Diagnosis not present

## 2023-11-24 DIAGNOSIS — N186 End stage renal disease: Secondary | ICD-10-CM | POA: Diagnosis not present

## 2023-11-24 DIAGNOSIS — N2581 Secondary hyperparathyroidism of renal origin: Secondary | ICD-10-CM | POA: Diagnosis not present

## 2023-11-25 ENCOUNTER — Ambulatory Visit (HOSPITAL_COMMUNITY)
Admission: RE | Admit: 2023-11-25 | Discharge: 2023-11-25 | Disposition: A | Discharge status: Home / Self Care | Source: Ambulatory Visit | Attending: Family | Admitting: Family

## 2023-11-25 DIAGNOSIS — B182 Chronic viral hepatitis C: Secondary | ICD-10-CM | POA: Diagnosis present

## 2023-11-26 DIAGNOSIS — N186 End stage renal disease: Secondary | ICD-10-CM | POA: Diagnosis not present

## 2023-11-26 DIAGNOSIS — D631 Anemia in chronic kidney disease: Secondary | ICD-10-CM | POA: Diagnosis not present

## 2023-11-26 DIAGNOSIS — D689 Coagulation defect, unspecified: Secondary | ICD-10-CM | POA: Diagnosis not present

## 2023-11-26 DIAGNOSIS — N2581 Secondary hyperparathyroidism of renal origin: Secondary | ICD-10-CM | POA: Diagnosis not present

## 2023-11-26 DIAGNOSIS — T8249XD Other complication of vascular dialysis catheter, subsequent encounter: Secondary | ICD-10-CM | POA: Diagnosis not present

## 2023-11-29 DIAGNOSIS — N2581 Secondary hyperparathyroidism of renal origin: Secondary | ICD-10-CM | POA: Diagnosis not present

## 2023-11-30 ENCOUNTER — Other Ambulatory Visit: Payer: Self-pay

## 2023-11-30 ENCOUNTER — Other Ambulatory Visit (HOSPITAL_COMMUNITY): Payer: Self-pay

## 2023-11-30 ENCOUNTER — Ambulatory Visit: Payer: Self-pay | Admitting: Family

## 2023-11-30 ENCOUNTER — Telehealth: Payer: Self-pay

## 2023-11-30 DIAGNOSIS — B182 Chronic viral hepatitis C: Secondary | ICD-10-CM

## 2023-11-30 MED ORDER — MAVYRET 100-40 MG PO TABS
3.0000 | ORAL_TABLET | Freq: Every day | ORAL | 1 refills | Status: DC
  Filled 2023-11-30: qty 84, 28d supply, fill #0
  Filled 2023-12-24 – 2023-12-28 (×2): qty 84, 28d supply, fill #1

## 2023-11-30 NOTE — Progress Notes (Signed)
 Specialty Pharmacy Initial Fill Coordination Note  Jerry Edwards is a 53 y.o. male contacted today regarding initial fill of specialty medication(s) Glecaprevir -Pibrentasvir  (Mavyret )   Patient requested Courier to Provider Office   Delivery date: 12/02/23   Verified address: [facility] [address] [place] [address]   Medication will be filled on 12/01/23.   Patient is aware of $0 copayment.

## 2023-11-30 NOTE — Addendum Note (Signed)
 Addended by: Ferlando Lia D on: 11/30/2023 02:35 PM   Modules accepted: Orders

## 2023-11-30 NOTE — Telephone Encounter (Signed)
 Pharmacy Patient Advocate Encounter  Received notification from [employer] that Prior Authorization for MAVYRET  has been APPROVED from 11/30/23 to 01/25/24. Ran test claim, Copay is $0. This test claim was processed through [facility]- copay amounts may vary at other pharmacies due to pharmacy/plan contracts, or as the patient moves through the different stages of their insurance plan.   PA #/Case ID/Reference #: [id number]

## 2023-11-30 NOTE — Telephone Encounter (Signed)
 Pharmacy Patient Advocate Encounter   Received notification from [employer] that prior authorization for MAVYRET  is required/requested.   Insurance verification completed.   The patient is insured through [facility] .   Per test claim: PA required; PA submitted to above mentioned insurance via [employer] Key/confirmation #/EOC [id number] Status is pending

## 2023-12-01 ENCOUNTER — Other Ambulatory Visit (HOSPITAL_COMMUNITY): Payer: Self-pay

## 2023-12-01 ENCOUNTER — Other Ambulatory Visit: Payer: Self-pay

## 2023-12-01 DIAGNOSIS — Z992 Dependence on renal dialysis: Secondary | ICD-10-CM | POA: Diagnosis not present

## 2023-12-01 DIAGNOSIS — T8249XD Other complication of vascular dialysis catheter, subsequent encounter: Secondary | ICD-10-CM | POA: Diagnosis not present

## 2023-12-01 DIAGNOSIS — N2581 Secondary hyperparathyroidism of renal origin: Secondary | ICD-10-CM | POA: Diagnosis not present

## 2023-12-01 DIAGNOSIS — N186 End stage renal disease: Secondary | ICD-10-CM | POA: Diagnosis not present

## 2023-12-01 DIAGNOSIS — D631 Anemia in chronic kidney disease: Secondary | ICD-10-CM | POA: Diagnosis not present

## 2023-12-01 DIAGNOSIS — D689 Coagulation defect, unspecified: Secondary | ICD-10-CM | POA: Diagnosis not present

## 2023-12-02 ENCOUNTER — Telehealth: Payer: Self-pay

## 2023-12-02 NOTE — Telephone Encounter (Signed)
 Received voicemail from patient requesting flu vaccine. Called him back, advised him that pharmacy team needed to speak with him regarding HCV treatment and that flu vaccine can be given whenever he comes in for his next appointment.   Call transferred to pharmacy team, all RPHs currently seeing patients. Lason Eveland that they would call him back when available.   Dealie Koelzer, BSN, RN

## 2023-12-02 NOTE — Telephone Encounter (Signed)
 Attempted to call Trevontae Lindahl to discuss his upcoming Mayvret treatment. Left a HIPAA-complaint voicemail with direct call back number. Will send a MyChart message as well with call back number.   Woodie Jock, PharmD PGY1 Pharmacy Resident  12/02/2023

## 2023-12-02 NOTE — Telephone Encounter (Signed)
 Spoke with Jerry Edwards today in regards to his upcoming Mavyret  therapy for HepC.   Counseled to take three pills once daily with his largest meal of the day to avoid GI symptoms (nausea, GI upset). Discussed start-up syndrome that may occur when first starting HepC treatment, including symptoms of fatigue and headache. These symptoms often dissipate with continued therapy, but encouraged him to call [facility] if he continues to have very bothersome symptoms. Provided direct call back number.   Emphasized the importance of taking medication as prescribed and avoid missing doses. If missed doses occur, do not take two doses in one day and resume normal schedule.   Woodie Jock, PharmD GY1 Pharmacy Resident   12/02/2023

## 2023-12-02 NOTE — Telephone Encounter (Signed)
 Thank you! We will call him back today :)

## 2023-12-03 ENCOUNTER — Other Ambulatory Visit: Payer: Self-pay | Admitting: Pharmacist

## 2023-12-03 ENCOUNTER — Telehealth: Payer: Self-pay

## 2023-12-03 NOTE — Telephone Encounter (Signed)
 [facility] Patient Advocate Encounter  Patient's medications MAVYRET have been couriered to [facility] from [facility] and will be picked up at [facility].  Kae Heller, CPhT Specialty Pharmacy Patient [facility] for Infectious Disease Phone: [phone] Fax:  [phone]

## 2023-12-03 NOTE — Progress Notes (Signed)
 Specialty Pharmacy Initiation Note   Jerry Edwards is a 53 y.o. male who will be followed by the specialty pharmacy service for RxSp Hepatitis C    Review of administration, indication, effectiveness, safety, potential side effects, storage/disposable, and missed dose instructions occurred today for patient's specialty medication(s) Glecaprevir -Pibrentasvir  (Mavyret )     Patient/Caregiver did not have any additional questions or concerns.   Patient's therapy is appropriate to: Initiate    Goals Addressed             This Visit's Progress    Achieve virologic cure as evidenced by SVR       Patient is initiating therapy. Patient will be evaluated at upcoming provider appointment to assess progress      Comply with lab assessments       Patient is on track. Patient will adhere to provider and/or lab appointments      Maintain optimal adherence to therapy       Patient is initiating therapy. Patient will be monitored by provider to determine if a change in treatment plan is warranted         Alan JINNY Geralds Specialty Pharmacist

## 2023-12-06 DIAGNOSIS — N2581 Secondary hyperparathyroidism of renal origin: Secondary | ICD-10-CM | POA: Diagnosis not present

## 2023-12-06 DIAGNOSIS — D689 Coagulation defect, unspecified: Secondary | ICD-10-CM | POA: Diagnosis not present

## 2023-12-06 DIAGNOSIS — N186 End stage renal disease: Secondary | ICD-10-CM | POA: Diagnosis not present

## 2023-12-06 DIAGNOSIS — Z992 Dependence on renal dialysis: Secondary | ICD-10-CM | POA: Diagnosis not present

## 2023-12-06 DIAGNOSIS — D631 Anemia in chronic kidney disease: Secondary | ICD-10-CM | POA: Diagnosis not present

## 2023-12-07 ENCOUNTER — Other Ambulatory Visit: Payer: Self-pay

## 2023-12-07 ENCOUNTER — Other Ambulatory Visit: Payer: Self-pay | Admitting: Pharmacist

## 2023-12-07 DIAGNOSIS — R110 Nausea: Secondary | ICD-10-CM

## 2023-12-07 MED ORDER — ONDANSETRON HCL 4 MG PO TABS
4.0000 mg | ORAL_TABLET | Freq: Three times a day (TID) | ORAL | 0 refills | Status: AC | PRN
  Filled 2023-12-07: qty 20, 7d supply, fill #0

## 2023-12-07 NOTE — Progress Notes (Signed)
 Patient's mother picking up Mavyret  today and requesting nausea medication to use as needed given possible side effect of nausea. Sent rx to [place].  Alan Geralds, PharmD, CPP, BCIDP, AAHIVP Clinical Pharmacist Practitioner Infectious Diseases Clinical Pharmacist [facility] for Infectious Disease

## 2023-12-08 DIAGNOSIS — D631 Anemia in chronic kidney disease: Secondary | ICD-10-CM | POA: Diagnosis not present

## 2023-12-08 DIAGNOSIS — N2581 Secondary hyperparathyroidism of renal origin: Secondary | ICD-10-CM | POA: Diagnosis not present

## 2023-12-08 DIAGNOSIS — D689 Coagulation defect, unspecified: Secondary | ICD-10-CM | POA: Diagnosis not present

## 2023-12-08 DIAGNOSIS — N186 End stage renal disease: Secondary | ICD-10-CM | POA: Diagnosis not present

## 2023-12-08 DIAGNOSIS — Z992 Dependence on renal dialysis: Secondary | ICD-10-CM | POA: Diagnosis not present

## 2023-12-10 DIAGNOSIS — T8249XD Other complication of vascular dialysis catheter, subsequent encounter: Secondary | ICD-10-CM | POA: Diagnosis not present

## 2023-12-10 DIAGNOSIS — D689 Coagulation defect, unspecified: Secondary | ICD-10-CM | POA: Diagnosis not present

## 2023-12-10 DIAGNOSIS — Z992 Dependence on renal dialysis: Secondary | ICD-10-CM | POA: Diagnosis not present

## 2023-12-10 DIAGNOSIS — N2581 Secondary hyperparathyroidism of renal origin: Secondary | ICD-10-CM | POA: Diagnosis not present

## 2023-12-10 DIAGNOSIS — D631 Anemia in chronic kidney disease: Secondary | ICD-10-CM | POA: Diagnosis not present

## 2023-12-10 DIAGNOSIS — N186 End stage renal disease: Secondary | ICD-10-CM | POA: Diagnosis not present

## 2023-12-13 DIAGNOSIS — D631 Anemia in chronic kidney disease: Secondary | ICD-10-CM | POA: Diagnosis not present

## 2023-12-14 ENCOUNTER — Other Ambulatory Visit: Payer: Self-pay
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
## 2023-12-14 NOTE — Patient Instructions (Signed)
 Visit Information  Thank you for taking time to visit with me today. Please don't hesitate to contact me if I can be of assistance to you before our next scheduled appointment.  Your next care management appointment is by telephone on Tuesday, November 17th at 1:00pm.   Please call the care guide team at [phone] if you need to cancel, schedule, or reschedule an appointment.   A reminder to ALL patients/family/friends, please call the [place]  National Suicide Prevention Lifeline: 9017119348 or TTY: (947) 604-6799 TTY (575)628-0898) to talk to a trained counselor if you are experiencing a Mental Health or Behavioral Health Crisis or need someone to talk to.  Santana Stamp BSN, CCM [place]  [facility] Population Health RN Care Manager Direct Dial: [phone]  Fax: [phone]

## 2023-12-16 ENCOUNTER — Ambulatory Visit

## 2023-12-16 DIAGNOSIS — R2689 Other abnormalities of gait and mobility: Secondary | ICD-10-CM | POA: Diagnosis not present

## 2023-12-16 DIAGNOSIS — G4733 Obstructive sleep apnea (adult) (pediatric): Secondary | ICD-10-CM | POA: Diagnosis not present

## 2023-12-16 DIAGNOSIS — I5022 Chronic systolic (congestive) heart failure: Secondary | ICD-10-CM | POA: Diagnosis not present

## 2023-12-20 DIAGNOSIS — T8249XD Other complication of vascular dialysis catheter, subsequent encounter: Secondary | ICD-10-CM | POA: Diagnosis not present

## 2023-12-20 DIAGNOSIS — D689 Coagulation defect, unspecified: Secondary | ICD-10-CM | POA: Diagnosis not present

## 2023-12-20 DIAGNOSIS — D631 Anemia in chronic kidney disease: Secondary | ICD-10-CM | POA: Diagnosis not present

## 2023-12-20 DIAGNOSIS — N2581 Secondary hyperparathyroidism of renal origin: Secondary | ICD-10-CM | POA: Diagnosis not present

## 2023-12-20 DIAGNOSIS — Z992 Dependence on renal dialysis: Secondary | ICD-10-CM | POA: Diagnosis not present

## 2023-12-21 DIAGNOSIS — N186 End stage renal disease: Secondary | ICD-10-CM | POA: Diagnosis not present

## 2023-12-21 DIAGNOSIS — I129 Hypertensive chronic kidney disease with stage 1 through stage 4 chronic kidney disease, or unspecified chronic kidney disease: Secondary | ICD-10-CM | POA: Diagnosis not present

## 2023-12-21 DIAGNOSIS — Z992 Dependence on renal dialysis: Secondary | ICD-10-CM | POA: Diagnosis not present

## 2023-12-22 ENCOUNTER — Other Ambulatory Visit: Payer: Self-pay

## 2023-12-24 ENCOUNTER — Other Ambulatory Visit: Payer: Self-pay

## 2023-12-28 ENCOUNTER — Encounter (HOSPITAL_COMMUNITY): Payer: Self-pay

## 2023-12-28 ENCOUNTER — Other Ambulatory Visit: Payer: Self-pay | Admitting: Pharmacy Technician

## 2023-12-28 ENCOUNTER — Other Ambulatory Visit: Payer: Self-pay

## 2023-12-28 NOTE — Progress Notes (Signed)
 Specialty Pharmacy Refill Coordination Note  Jerry Edwards is a 53 y.o. male contacted today regarding refills of specialty medication(s) Glecaprevir -Pibrentasvir  (Mavyret )   Patient requested Delivery   Delivery date: 12/31/23   Verified address: [address]  [place]   Medication will be filled on 12/30/23.

## 2023-12-29 ENCOUNTER — Other Ambulatory Visit: Payer: Self-pay

## 2023-12-30 ENCOUNTER — Ambulatory Visit (HOSPITAL_COMMUNITY)
Admission: RE | Admit: 2023-12-30 | Discharge: 2023-12-30 | Disposition: A | Discharge status: Home / Self Care | Attending: Vascular Surgery | Admitting: Vascular Surgery

## 2023-12-30 ENCOUNTER — Encounter (HOSPITAL_COMMUNITY)
Admission: RE | Disposition: A | Discharge status: Home / Self Care | Payer: Self-pay | Source: Home / Self Care | Attending: Vascular Surgery

## 2023-12-30 ENCOUNTER — Other Ambulatory Visit: Payer: Self-pay

## 2023-12-30 DIAGNOSIS — Z992 Dependence on renal dialysis: Secondary | ICD-10-CM | POA: Insufficient documentation

## 2023-12-30 DIAGNOSIS — I428 Other cardiomyopathies: Secondary | ICD-10-CM | POA: Insufficient documentation

## 2023-12-30 DIAGNOSIS — Z87891 Personal history of nicotine dependence: Secondary | ICD-10-CM | POA: Insufficient documentation

## 2023-12-30 DIAGNOSIS — T82590A Other mechanical complication of surgically created arteriovenous fistula, initial encounter: Secondary | ICD-10-CM | POA: Diagnosis not present

## 2023-12-30 DIAGNOSIS — I132 Hypertensive heart and chronic kidney disease with heart failure and with stage 5 chronic kidney disease, or end stage renal disease: Secondary | ICD-10-CM | POA: Insufficient documentation

## 2023-12-30 DIAGNOSIS — I5022 Chronic systolic (congestive) heart failure: Secondary | ICD-10-CM | POA: Diagnosis not present

## 2023-12-30 DIAGNOSIS — Z993 Dependence on wheelchair: Secondary | ICD-10-CM | POA: Diagnosis not present

## 2023-12-30 DIAGNOSIS — N186 End stage renal disease: Secondary | ICD-10-CM | POA: Insufficient documentation

## 2023-12-30 HISTORY — PX: A/V FISTULAGRAM: CATH118298

## 2023-12-30 SURGERY — A/V FISTULAGRAM
Anesthesia: LOCAL | Site: Arm Upper | Laterality: Left

## 2023-12-30 MED ORDER — LIDOCAINE HCL (PF) 1 % IJ SOLN
INTRAMUSCULAR | Status: DC | PRN
  Administered 2023-12-30: 5 mL via INTRADERMAL

## 2023-12-30 MED ORDER — LIDOCAINE HCL (PF) 1 % IJ SOLN
INTRAMUSCULAR | Status: AC
  Filled 2023-12-30: qty 30

## 2023-12-30 MED ORDER — IODIXANOL 320 MG/ML IV SOLN
INTRAVENOUS | Status: DC | PRN
  Administered 2023-12-30: 40 mL

## 2023-12-30 MED ORDER — SODIUM CHLORIDE 0.9 % IV SOLN
INTRAVENOUS | Status: AC | PRN
  Administered 2023-12-30: 1000 mL via INTRAVENOUS

## 2023-12-30 SURGICAL SUPPLY — 7 items
GLIDEWIRE ADV .035X180CM (WIRE) IMPLANT
KIT MICROPUNCTURE NIT STIFF (SHEATH) ×1 IMPLANT
KIT PV (KITS) ×2 IMPLANT
MAT PREVALON FULL STRYKER (MISCELLANEOUS) ×1 IMPLANT
SHEATH PROBE COVER 6X72 (BAG) ×1 IMPLANT
TRAY PV CATH (CUSTOM PROCEDURE TRAY) ×2 IMPLANT
TUBING CIL FLEX 10 FLL-RA (TUBING) ×1 IMPLANT
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
## 2023-12-30 NOTE — Op Note (Signed)
    Patient name: Sayeed Weatherall MRN: [id number] DOB: 1970-09-06 Sex: male  12/30/2023 Pre-operative Diagnosis: ESRD on HD Post-operative diagnosis:  Same Surgeon:  Norman GORMAN Serve, MD Procedure Performed:  Ultrasound-guided access of dialysis circuit, fistulogram and central venogram.  [phone]  Indications: Mr. Sinning is a 53 year old male with ESRD on HD presenting to the HD access center today for fistulogram.  Has been having issues with cannulation and continues to dialyze through a [facility] despite having this placed in March 2025.  Risks and benefits of fistulogram were reviewed and he elected to proceed.  Findings:  Widely patent central venous system, widely patent cephalic arch.  Widely patent AV fistula with a large branch coming off near the anastomosis and traveling down the forearm.   Procedure:  The patient was identified in the holding area and taken to the cath lab  The patient was then placed supine on the table and prepped and draped in the usual sterile fashion.  A time out was called.  Ultrasound was used to evaluate the left arm AV access. This was accessed under u/s guidance. An 018 wire was advanced without resistance, a micropuncture sheath was placed and fistulagram obtained which demonstrated the above findings.  Contrast: 40 cc Sedation: None  Impression: Widely patent AV fistula with a large branch coming off near the anastomosis that travels down the forearm. Likely stealing flow from the main fistula. No flow-limiting stenosis identified.  Would benefit from branch ligation, I discussed this with them and will send the office a message for outpatient branch ligation on a Tuesday or Thursday   Norman GORMAN Serve MD [facility] [place] Office: [phone]

## 2023-12-31 ENCOUNTER — Telehealth: Payer: Self-pay
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
## 2023-12-31 NOTE — Telephone Encounter (Signed)
 Attempted to call for surgery scheduling. LVM

## 2024-01-03 ENCOUNTER — Encounter (HOSPITAL_COMMUNITY): Payer: Self-pay | Admitting: Vascular Surgery

## 2024-01-04 ENCOUNTER — Ambulatory Visit (INDEPENDENT_AMBULATORY_CARE_PROVIDER_SITE_OTHER): Admitting: Pharmacist

## 2024-01-04 ENCOUNTER — Other Ambulatory Visit: Payer: Self-pay

## 2024-01-04 DIAGNOSIS — Z23 Encounter for immunization: Secondary | ICD-10-CM | POA: Diagnosis not present

## 2024-01-04 DIAGNOSIS — B182 Chronic viral hepatitis C: Secondary | ICD-10-CM

## 2024-01-04 NOTE — Progress Notes (Signed)
 Bowman Higbie                                          MRN: [id number]   01/04/2024   The [facility] Quality Team Specialist reviewed this patient medical record for the purposes of chart review for care gap closure. The following were reviewed: chart review for care gap closure-diabetic eye exam.    VBCI Quality Team

## 2024-01-04 NOTE — Addendum Note (Signed)
 Addended by: WADDELL ALAN PARAS on: 01/04/2024 11:12 AM   Modules accepted: Orders

## 2024-01-05 ENCOUNTER — Ambulatory Visit: Payer: Self-pay | Admitting: Pharmacist

## 2024-01-05 NOTE — Progress Notes (Signed)
 FYI - bilirubin slightly increased after one month of Mavyret . Unsure of possible cause aside from HCV. Thankfully, LFTs are completely normal.

## 2024-01-06 LAB — COMPREHENSIVE METABOLIC PANEL WITH GFR
AG Ratio: 0.9 (calc) — ABNORMAL LOW (ref 1.0–2.5)
ALT: 8 U/L — ABNORMAL LOW (ref 9–46)
AST: 12 U/L (ref 10–35)
Albumin: 3.5 g/dL — ABNORMAL LOW (ref 3.6–5.1)
Alkaline phosphatase (APISO): 82 U/L (ref 35–144)
BUN/Creatinine Ratio: 8 (calc) (ref 6–22)
BUN: 35 mg/dL — ABNORMAL HIGH (ref 7–25)
CO2: 34 mmol/L — ABNORMAL HIGH (ref 20–32)
Calcium: 8.8 mg/dL (ref 8.6–10.3)
Chloride: 97 mmol/L — ABNORMAL LOW (ref 98–110)
Creat: 4.47 mg/dL — ABNORMAL HIGH (ref 0.70–1.30)
Globulin: 3.7 g/dL (ref 1.9–3.7)
Glucose, Bld: 98 mg/dL (ref 65–99)
Potassium: 3.8 mmol/L (ref 3.5–5.3)
Sodium: 142 mmol/L (ref 135–146)
Total Bilirubin: 1.7 mg/dL — ABNORMAL HIGH (ref 0.2–1.2)
Total Protein: 7.2 g/dL (ref 6.1–8.1)
eGFR: 15 mL/min/1.73m2 — ABNORMAL LOW (ref >=60)

## 2024-01-06 LAB — HEPATITIS C RNA QUANTITATIVE
HCV Quantitative Log: <1.18 {Log_IU}/mL
HCV RNA, PCR, QN: <15 [IU]/mL

## 2024-01-11 ENCOUNTER — Other Ambulatory Visit: Payer: Self-pay

## 2024-01-11 DIAGNOSIS — N186 End stage renal disease: Secondary | ICD-10-CM

## 2024-01-13 ENCOUNTER — Ambulatory Visit

## 2024-01-13 DIAGNOSIS — Z1211 Encounter for screening for malignant neoplasm of colon: Secondary | ICD-10-CM

## 2024-01-13 DIAGNOSIS — Z0000 Encounter for general adult medical examination without abnormal findings: Secondary | ICD-10-CM | POA: Diagnosis not present

## 2024-01-13 NOTE — Patient Instructions (Addendum)
 Jerry Edwards,  Thank you for taking the time for your Medicare Wellness Visit. I appreciate your continued commitment to your health goals. Please review the care plan we discussed, and feel free to reach out if I can assist you further.  Medicare recommends these wellness visits once per year to help you and your care team stay ahead of potential health issues. These visits are designed to focus on prevention, allowing your provider to concentrate on managing your acute and chronic conditions during your regular appointments.  Please note that Annual Wellness Visits do not include a physical exam. Some assessments may be limited, especially if the visit was conducted virtually. If needed, we may recommend a separate in-person follow-up with your provider.  Ongoing Care Seeing your primary care provider every 3 to 6 months helps us  monitor your health and provide consistent, personalized care.   Referrals If a referral was made during today's visit and you haven't received any updates within two weeks, please contact the referred provider directly to check on the status. REFERRAL SENT FOR GASTROENTEROLOGY  CONSULT   Recommended Screenings:  Health Maintenance  Topic Date Due   Complete foot exam   Never done   Hepatitis B Vaccine (1 of 3 - 19+ 3-dose series) Never done   Colon Cancer Screening  Never done   Zoster (Shingles) Vaccine (1 of 2) Never done   COVID-19 Vaccine (3 - 2025-26 season) 11/22/2023   Eye exam for diabetics  03/22/2024*   Hemoglobin A1C  04/06/2024   Medicare Annual Wellness Visit  01/12/2025   DTaP/Tdap/Td vaccine (2 - Td or Tdap) 10/04/2033   Pneumococcal Vaccine for age over 86  Completed   Flu Shot  Completed   Hepatitis C Screening  Completed   HIV Screening  Completed   HPV Vaccine  Aged Out   Meningitis B Vaccine  Aged Out  *Topic was postponed. The date shown is not the original due date.    Advance Care Planning is important because it: Ensures you  receive medical care that aligns with your values, goals, and preferences. Provides guidance to your family and loved ones, reducing the emotional burden of decision-making during critical moments.  Vision: Annual vision screenings are recommended for early detection of glaucoma, cataracts, and diabetic retinopathy. These exams can also reveal signs of chronic conditions such as diabetes and high blood pressure.  Dental: Annual dental screenings help detect early signs of oral cancer, gum disease, and other conditions linked to overall health, including heart disease and diabetes.  Please see the attached documents for additional preventive care recommendations.   NEXT AWV  01/25/25 @ 11:30 AM BY VIDEO
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
## 2024-01-15 DIAGNOSIS — R2689 Other abnormalities of gait and mobility: Secondary | ICD-10-CM | POA: Diagnosis not present

## 2024-01-15 DIAGNOSIS — I5022 Chronic systolic (congestive) heart failure: Secondary | ICD-10-CM | POA: Diagnosis not present

## 2024-01-15 DIAGNOSIS — G4733 Obstructive sleep apnea (adult) (pediatric): Secondary | ICD-10-CM | POA: Diagnosis not present

## 2024-01-17 ENCOUNTER — Encounter (HOSPITAL_COMMUNITY): Payer: Self-pay | Admitting: Vascular Surgery

## 2024-01-17 ENCOUNTER — Other Ambulatory Visit: Payer: Self-pay
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
## 2024-01-17 NOTE — Anesthesia Preprocedure Evaluation (Signed)
 Anesthesia Evaluation  Patient identified by MRN, date of birth, ID band Patient awake    Reviewed: Allergy & Precautions, NPO status , Patient's Chart, lab work & pertinent test results, reviewed documented beta blocker date and time   History of Anesthesia Complications Negative for: history of anesthetic complications  Airway Mallampati: IV  TM Distance: >3 FB Neck ROM: Limited    Dental no notable dental hx.    Pulmonary sleep apnea , former smoker    + decreased breath sounds      Cardiovascular hypertension, +CHF   Rhythm:Regular Rate:Normal  05/2023: IMPRESSIONS     1. Limited Echocardiogram - refer to full echo dated 06/06/2023.   2. Left ventricular ejection fraction, by estimation, is 30 to 35%. The  left ventricle has moderately decreased function.   3. The mitral valve is grossly normal. Moderate mitral valve  regurgitation. No evidence of mitral stenosis.     Neuro/Psych neg Seizures    GI/Hepatic ,neg GERD  ,,(+) neg Cirrhosis        Endo/Other  diabetes, Type 2    Renal/GU ARF, ESRF and DialysisRenal diseaseLast dialysis 10/27     Musculoskeletal Osteogenesis imperfecta   Abdominal  (+) + obese  Peds  Hematology  (+) Blood dyscrasia, anemia   Anesthesia Other Findings   Reproductive/Obstetrics                              Anesthesia Physical Anesthesia Plan  ASA: 4  Anesthesia Plan: Regional and MAC   Post-op Pain Management: Regional block*   Induction: Intravenous  PONV Risk Score and Plan: 2 and Ondansetron  and Dexamethasone   Airway Management Planned:   Additional Equipment:   Intra-op Plan:   Post-operative Plan:   Informed Consent: I have reviewed the patients History and Physical, chart, labs and discussed the procedure including the risks, benefits and alternatives for the proposed anesthesia with the patient or authorized representative who  has indicated his/her understanding and acceptance.     Dental advisory given  Plan Discussed with: CRNA  Anesthesia Plan Comments: (PAT note written 01/17/2024 by Roshanda Balazs, PA-C. History includes former smoker, osteogenesis imperfecta (wheelchair dependent), nonischemic cardiomyopathy (diagnosed ~ 02/2012, normal coronaries 2015), chronic systolic CHF, pulmonary hypertension (moderate mixed PAH 05/2023 RHC; as needed home O2), mitral regurgitation (moderate 05/2023), HTN, ESRD (HD initiated 05/2023, HD MWF), chronic hepatitis C (diagnosed by screening 10/05/2023, initiated therapy with Mavyret  12/03/2023), dyspnea, OSA (with CPAP), obesity. S/p AVF creation 05/2023, but has a competing branch. Has TDC for HD.    )         Anesthesia Quick Evaluation

## 2024-01-17 NOTE — Progress Notes (Signed)
 SDW call  Patient was given pre-op  instructions over the phone. Patient verbalized understanding of instructions provided.     PCP - Sula Leavy Rode, PA-C HF Cardiologist - Dr. Toribio Fuel Nephrology: Riccardo, dialysis M, W,  F Pulmonary:    PPM/ICD - denies Device Orders - na Rep Notified - na   Chest x-ray - 06/06/2023 EKG -  06/06/2023 Stress Test -10/16/2016 ECHO - 06/10/2023 Cardiac Cath - 06/04/2021  Sleep Study/sleep apnea/CPAP: OSA with CPAP  Non-diabetic   Blood Thinner Instructions: denies Aspirin  Instructions: continue   ERAS Protcol - NPO   Anesthesia review: Yes. OSA with CPAP, pulmonary HTN, CHF, HTN, ESRD with dialysis M, W, F home oxygen  when SOB   Patient denies shortness of breath, fever, cough and chest pain over the phone call  Your procedure is scheduled on Tuesday January 18, 2024  Report to [facility] Main Entrance A at  0720  A.M., then check in with the Admitting office.  Call this number if you have problems the morning of surgery:  [phone]   If you have any questions prior to your surgery date call [phone]: Open Monday-Friday 8am-4pm If you experience any cold or flu symptoms such as cough, fever, chills, shortness of breath, etc. between now and your scheduled surgery, please notify us  at the above number    Remember:  Do not eat or drink after midnight the night before your surgery  Take these medicines the morning of surgery with A SIP OF WATER :  ASA, carvedilol , pataday  eye drops  As needed: Flexeril , zofran , tramadol   As of today, STOP taking any Aspirin  (unless otherwise instructed by your surgeon) Aleve, Naproxen, Ibuprofen , Motrin , Advil , Goody's, BC's, all herbal medications, fish oil, and all vitamins.

## 2024-01-18 ENCOUNTER — Encounter (HOSPITAL_COMMUNITY)
Admission: RE | Disposition: A | Discharge status: Home / Self Care | Payer: Self-pay | Source: Home / Self Care | Attending: Vascular Surgery

## 2024-01-18 ENCOUNTER — Encounter (HOSPITAL_COMMUNITY): Payer: Self-pay | Admitting: Vascular Surgery

## 2024-01-18 ENCOUNTER — Ambulatory Visit (HOSPITAL_COMMUNITY): Admitting: Vascular Surgery

## 2024-01-18 ENCOUNTER — Other Ambulatory Visit: Payer: Self-pay

## 2024-01-18 ENCOUNTER — Other Ambulatory Visit (HOSPITAL_COMMUNITY): Payer: Self-pay

## 2024-01-18 ENCOUNTER — Ambulatory Visit (HOSPITAL_COMMUNITY)
Admission: RE | Admit: 2024-01-18 | Discharge: 2024-01-18 | Disposition: A | Discharge status: Home / Self Care | Attending: Vascular Surgery | Admitting: Vascular Surgery

## 2024-01-18 DIAGNOSIS — I132 Hypertensive heart and chronic kidney disease with heart failure and with stage 5 chronic kidney disease, or end stage renal disease: Secondary | ICD-10-CM

## 2024-01-18 DIAGNOSIS — Q780 Osteogenesis imperfecta: Secondary | ICD-10-CM | POA: Diagnosis not present

## 2024-01-18 DIAGNOSIS — I5023 Acute on chronic systolic (congestive) heart failure: Secondary | ICD-10-CM | POA: Diagnosis not present

## 2024-01-18 DIAGNOSIS — Z87891 Personal history of nicotine dependence: Secondary | ICD-10-CM | POA: Diagnosis not present

## 2024-01-18 DIAGNOSIS — I5022 Chronic systolic (congestive) heart failure: Secondary | ICD-10-CM | POA: Diagnosis not present

## 2024-01-18 DIAGNOSIS — I428 Other cardiomyopathies: Secondary | ICD-10-CM | POA: Insufficient documentation

## 2024-01-18 DIAGNOSIS — E1122 Type 2 diabetes mellitus with diabetic chronic kidney disease: Secondary | ICD-10-CM | POA: Diagnosis not present

## 2024-01-18 DIAGNOSIS — Z993 Dependence on wheelchair: Secondary | ICD-10-CM | POA: Diagnosis not present

## 2024-01-18 DIAGNOSIS — Z6841 Body Mass Index (BMI) 40.0 and over, adult: Secondary | ICD-10-CM | POA: Diagnosis not present

## 2024-01-18 DIAGNOSIS — N186 End stage renal disease: Secondary | ICD-10-CM | POA: Diagnosis not present

## 2024-01-18 DIAGNOSIS — G4730 Sleep apnea, unspecified: Secondary | ICD-10-CM | POA: Insufficient documentation

## 2024-01-18 DIAGNOSIS — T82590A Other mechanical complication of surgically created arteriovenous fistula, initial encounter: Secondary | ICD-10-CM | POA: Diagnosis not present

## 2024-01-18 DIAGNOSIS — Z992 Dependence on renal dialysis: Secondary | ICD-10-CM | POA: Insufficient documentation

## 2024-01-18 HISTORY — PX: LIGATION OF COMPETING BRANCHES OF ARTERIOVENOUS FISTULA: SHX5949

## 2024-01-18 HISTORY — DX: Dyspnea, unspecified: R06.00

## 2024-01-18 HISTORY — DX: Chronic kidney disease, unspecified: N18.9

## 2024-01-18 HISTORY — DX: Nonrheumatic mitral (valve) insufficiency: I34.0

## 2024-01-18 HISTORY — DX: Sleep apnea, unspecified: G47.30

## 2024-01-18 HISTORY — DX: Inflammatory liver disease, unspecified: K75.9

## 2024-01-18 LAB — POCT I-STAT, CHEM 8
BUN: 27 mg/dL — ABNORMAL HIGH (ref 6–20)
Calcium, Ion: 1.02 mmol/L — ABNORMAL LOW (ref 1.15–1.40)
Chloride: 101 mmol/L (ref 98–111)
Creatinine, Ser: 4.3 mg/dL — ABNORMAL HIGH (ref 0.61–1.24)
Glucose, Bld: 91 mg/dL (ref 70–99)
HCT: 38 % — ABNORMAL LOW (ref 39.0–52.0)
Hemoglobin: 12.9 g/dL — ABNORMAL LOW (ref 13.0–17.0)
Potassium: 3.6 mmol/L (ref 3.5–5.1)
Sodium: 141 mmol/L (ref 135–145)
TCO2: 29 mmol/L (ref 22–32)

## 2024-01-18 LAB — GLUCOSE, CAPILLARY: Glucose-Capillary: 159 mg/dL — ABNORMAL HIGH (ref 70–99)

## 2024-01-18 SURGERY — LIGATION OF COMPETING BRANCHES OF ARTERIOVENOUS FISTULA
Anesthesia: Monitor Anesthesia Care | Site: Arm Upper | Laterality: Left

## 2024-01-18 MED ORDER — FENTANYL CITRATE (PF) 100 MCG/2ML IJ SOLN
INTRAMUSCULAR | Status: AC
  Filled 2024-01-18: qty 2

## 2024-01-18 MED ORDER — CEFAZOLIN SODIUM-DEXTROSE 2-4 GM/100ML-% IV SOLN
2.0000 g | INTRAVENOUS | Status: AC
  Administered 2024-01-18: 2 g via INTRAVENOUS
  Filled 2024-01-18: qty 100

## 2024-01-18 MED ORDER — PROPOFOL 10 MG/ML IV BOLUS
INTRAVENOUS | Status: AC
Start: 2024-01-18 — End: 2024-01-18
  Filled 2024-01-18: qty 20

## 2024-01-18 MED ORDER — PHENYLEPHRINE 80 MCG/ML (10ML) SYRINGE FOR IV PUSH (FOR BLOOD PRESSURE SUPPORT)
PREFILLED_SYRINGE | INTRAVENOUS | Status: DC | PRN
  Administered 2024-01-18: 160 ug via INTRAVENOUS
  Administered 2024-01-18: 200 ug via INTRAVENOUS
  Administered 2024-01-18: 160 ug via INTRAVENOUS

## 2024-01-18 MED ORDER — ACETAMINOPHEN 10 MG/ML IV SOLN: 1000.0000 mg | Freq: Once | INTRAVENOUS | Status: DC | PRN

## 2024-01-18 MED ORDER — IPRATROPIUM-ALBUTEROL 0.5-2.5 (3) MG/3ML IN SOLN
3.0000 mL | Freq: Once | RESPIRATORY_TRACT | Status: AC
  Administered 2024-01-18: 3 mL via RESPIRATORY_TRACT

## 2024-01-18 MED ORDER — LIDOCAINE 2% (20 MG/ML) 5 ML SYRINGE
INTRAMUSCULAR | Status: AC
  Filled 2024-01-18: qty 5

## 2024-01-18 MED ORDER — CHLORHEXIDINE GLUCONATE 4 % EX SOLN: 60.0000 mL | Freq: Once | CUTANEOUS | Status: DC

## 2024-01-18 MED ORDER — EPHEDRINE SULFATE-NACL 50-0.9 MG/10ML-% IV SOSY
PREFILLED_SYRINGE | INTRAVENOUS | Status: DC | PRN
  Administered 2024-01-18: 15 mg via INTRAVENOUS

## 2024-01-18 MED ORDER — DEXMEDETOMIDINE HCL IN NACL 80 MCG/20ML IV SOLN
INTRAVENOUS | Status: DC | PRN
  Administered 2024-01-18: 4 ug via INTRAVENOUS

## 2024-01-18 MED ORDER — EPHEDRINE 5 MG/ML INJ
INTRAVENOUS | Status: AC
  Filled 2024-01-18: qty 5

## 2024-01-18 MED ORDER — 0.9 % SODIUM CHLORIDE (POUR BTL) OPTIME
TOPICAL | Status: DC | PRN
  Administered 2024-01-18: 1000 mL

## 2024-01-18 MED ORDER — CHLORHEXIDINE GLUCONATE 0.12 % MT SOLN
15.0000 mL | Freq: Once | OROMUCOSAL | Status: AC
Start: 2024-01-18 — End: 2024-01-18
  Administered 2024-01-18: 15 mL via OROMUCOSAL
  Filled 2024-01-18: qty 15

## 2024-01-18 MED ORDER — MIDAZOLAM HCL (PF) 2 MG/2ML IJ SOLN
INTRAMUSCULAR | Status: DC | PRN
  Administered 2024-01-18: 1 mg via INTRAVENOUS

## 2024-01-18 MED ORDER — FENTANYL CITRATE (PF) 100 MCG/2ML IJ SOLN: 50.0000 ug | Freq: Once | INTRAMUSCULAR | Status: AC

## 2024-01-18 MED ORDER — SODIUM CHLORIDE 0.9 % IV SOLN: INTRAVENOUS | Status: DC

## 2024-01-18 MED ORDER — ORAL CARE MOUTH RINSE: 15.0000 mL | Freq: Once | OROMUCOSAL | Status: AC

## 2024-01-18 MED ORDER — PHENYLEPHRINE 80 MCG/ML (10ML) SYRINGE FOR IV PUSH (FOR BLOOD PRESSURE SUPPORT)
PREFILLED_SYRINGE | INTRAVENOUS | Status: AC
  Filled 2024-01-18: qty 10

## 2024-01-18 MED ORDER — FENTANYL CITRATE (PF) 100 MCG/2ML IJ SOLN: 25.0000 ug | INTRAMUSCULAR | Status: DC | PRN

## 2024-01-18 MED ORDER — FENTANYL CITRATE (PF) 100 MCG/2ML IJ SOLN
INTRAMUSCULAR | Status: AC
  Administered 2024-01-18: 50 ug via INTRAVENOUS
  Filled 2024-01-18: qty 2

## 2024-01-18 MED ORDER — PHENYLEPHRINE HCL-NACL 20-0.9 MG/250ML-% IV SOLN
INTRAVENOUS | Status: DC | PRN
  Administered 2024-01-18: 15 ug/min via INTRAVENOUS

## 2024-01-18 MED ORDER — OXYCODONE HCL 5 MG PO TABS: 5.0000 mg | ORAL_TABLET | Freq: Once | ORAL | Status: DC | PRN

## 2024-01-18 MED ORDER — LIDOCAINE-EPINEPHRINE (PF) 1.5 %-1:200000 IJ SOLN
INTRAMUSCULAR | Status: DC | PRN
Start: 2024-01-18 — End: 2024-01-18
  Administered 2024-01-18: 25 mL via PERINEURAL

## 2024-01-18 MED ORDER — IPRATROPIUM-ALBUTEROL 0.5-2.5 (3) MG/3ML IN SOLN
RESPIRATORY_TRACT | Status: AC
  Filled 2024-01-18: qty 3

## 2024-01-18 MED ORDER — TRAMADOL HCL 50 MG PO TABS
50.0000 mg | ORAL_TABLET | Freq: Two times a day (BID) | ORAL | 0 refills | Status: DC | PRN
  Filled 2024-01-18: qty 6, 3d supply, fill #0

## 2024-01-18 MED ORDER — PROPOFOL 500 MG/50ML IV EMUL
INTRAVENOUS | Status: DC | PRN
  Administered 2024-01-18: 30 ug/kg/min via INTRAVENOUS

## 2024-01-18 MED ORDER — OXYCODONE HCL 5 MG/5ML PO SOLN: 5.0000 mg | Freq: Once | ORAL | Status: DC | PRN

## 2024-01-18 MED ORDER — MIDAZOLAM HCL 2 MG/2ML IJ SOLN
INTRAMUSCULAR | Status: AC
  Filled 2024-01-18: qty 2

## 2024-01-18 MED ORDER — DROPERIDOL 2.5 MG/ML IJ SOLN: 0.6250 mg | Freq: Once | INTRAMUSCULAR | Status: DC | PRN

## 2024-01-18 SURGICAL SUPPLY — 25 items
BAG COUNTER SPONGE SURGICOUNT (BAG) ×1 IMPLANT
CANISTER SUCTION 3000ML PPV (SUCTIONS) ×1 IMPLANT
CLIP TI MEDIUM 6 (CLIP) ×1 IMPLANT
CLIP TI WIDE RED SMALL 6 (CLIP) ×1 IMPLANT
COVER PROBE W GEL 5X96 (DRAPES) IMPLANT
DERMABOND ADVANCED .7 DNX12 (GAUZE/BANDAGES/DRESSINGS) ×1 IMPLANT
DERMABOND ADVANCED .7 DNX6 (GAUZE/BANDAGES/DRESSINGS) IMPLANT
ELECTRODE REM PT RTRN 9FT ADLT (ELECTROSURGICAL) ×1 IMPLANT
GLOVE BIOGEL PI IND STRL 7.0 (GLOVE) ×1 IMPLANT
GOWN STRL REUS W/ TWL LRG LVL3 (GOWN DISPOSABLE) ×2 IMPLANT
GOWN STRL REUS W/ TWL XL LVL3 (GOWN DISPOSABLE) ×1 IMPLANT
KIT BASIN OR (CUSTOM PROCEDURE TRAY) ×1 IMPLANT
KIT TURNOVER KIT B (KITS) IMPLANT
PACK CV ACCESS (CUSTOM PROCEDURE TRAY) ×1 IMPLANT
PAD ARMBOARD POSITIONER FOAM (MISCELLANEOUS) ×2 IMPLANT
POWDER SURGICEL 3.0 GRAM (HEMOSTASIS) IMPLANT
SLING ARM FOAM STRAP MED (SOFTGOODS) IMPLANT
SOLN 0.9% NACL POUR BTL 1000ML (IV SOLUTION) ×1 IMPLANT
SOLN STERILE WATER BTL 1000 ML (IV SOLUTION) ×1 IMPLANT
SUT MNCRL AB 4-0 PS2 18 (SUTURE) ×1 IMPLANT
SUT PROLENE 6 0 BV (SUTURE) IMPLANT
SUT SILK 0 TIES 10X30 (SUTURE) ×1 IMPLANT
SUT VIC AB 3-0 SH 27X BRD (SUTURE) ×1 IMPLANT
TOWEL GREEN STERILE (TOWEL DISPOSABLE) ×1 IMPLANT
UNDERPAD 30X36 HEAVY ABSORB (UNDERPADS AND DIAPERS) ×1 IMPLANT
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
## 2024-01-18 NOTE — Transfer of Care (Signed)
 Immediate Anesthesia Transfer of Care Note  Patient: Jerry Edwards  Procedure(s) Performed: LIGATION OF COMPETING BRANCHES OF LEFT ARTERIOVENOUS FISTULA (Left: Arm Upper)  Patient Location: PACU  Anesthesia Type:MAC combined with regional for post-op pain  Level of Consciousness: drowsy  Airway & Oxygen  Therapy: Patient Spontanous Breathing and Patient connected to face mask oxygen   Post-op Assessment: Report given to RN and Post -op Vital signs reviewed and stable  Post vital signs: Reviewed and stable  Last Vitals:  Vitals Value Taken Time  BP 112/74 01/18/24 10:54  Temp    Pulse 66 01/18/24 10:58  Resp 21 01/18/24 10:58  SpO2 98 % 01/18/24 10:58  Vitals shown include unfiled device data.  Last Pain:  Vitals:   01/18/24 0819  TempSrc:   PainSc: 0-No pain         Complications: No notable events documented.

## 2024-01-18 NOTE — Anesthesia Postprocedure Evaluation (Signed)
 Anesthesia Post Note  Patient: Jerry Edwards  Procedure(s) Performed: LIGATION OF COMPETING BRANCHES OF LEFT ARTERIOVENOUS FISTULA (Left: Arm Upper)     Patient location during evaluation: PACU Anesthesia Type: Regional and MAC Level of consciousness: awake and alert Pain management: pain level controlled Vital Signs Assessment: post-procedure vital signs reviewed and stable Respiratory status: spontaneous breathing, nonlabored ventilation, respiratory function stable and patient connected to nasal cannula oxygen  Cardiovascular status: stable and blood pressure returned to baseline Postop Assessment: no apparent nausea or vomiting Anesthetic complications: no   No notable events documented.  Last Vitals:  Vitals:   01/18/24 1230 01/18/24 1245  BP: 106/78 115/79  Pulse: 98 64  Resp: 18 20  Temp:  36.6 C  SpO2: 90% 100%    Last Pain:  Vitals:   01/18/24 1245  TempSrc:   PainSc: 0-No pain                 Thom JONELLE Peoples

## 2024-01-18 NOTE — Op Note (Signed)
    OPERATIVE NOTE  PROCEDURE:   Ligation of branches, AV fistula  PRE-OPERATIVE DIAGNOSIS: ESRD on HD  POST-OPERATIVE DIAGNOSIS: same as above   SURGEON: Norman GORMAN Serve MD  ASSISTANT(S): None  ANESTHESIA: regional  ESTIMATED BLOOD LOSS: 10 cc  FINDING(S): Large branch coming off the proximal portion of the fistula near the anastomosis.  No branches visualized with ultrasound throughout the rest of the upper arm.  SPECIMEN(S):  none  INDICATIONS:   Jerry Edwards is a 53 y.o. male with ESRD on HD.  He presented to the HD access center a few weeks ago for fistulogram due to difficulty with cannulation.  This demonstrated a widely patent AV fistula throughout the upper arm, cephalic arch and central veins.  There was a large branch stemming from the peripheral portion a few centimeters from the anastomosis.  This seemed fairly sizable and was tracking down the entire forearm.  Therefore some benefits of branch ligation were reviewed and he elected to proceed.  DESCRIPTION: In preop the anesthesia team performed a peripheral nerve block.  The patient was then brought to the operating room and positioned supine on the operating table.  Sedation was administered and the arm was prepped and draped in usual sterile fashion.  Preoperative antibiotics were administered and a timeout was performed. I started by mapping the entire fistula with ultrasound.  A large branch was noted that tracked peripherally to the forearm.  This was a fairly sizable branch and about 2 cm from the anastomosis.  A transverse incision was made overlying this branch.  Dissection was carried down deeper until the anterior surface of the branch was noted.  I dissected slightly further medially to visualize the true portion of the fistula coming off the anastomosis.  This branch was then isolated for approximately 1-1/2 to 3 cm and ligated between silk ties which were buttressed with vascular clips.  Prior to transecting  we ensured that there was still a thrill in the fistula which there was.  The incision was then copiously irrigated and hemostasis was achieved.  The incision was then closed with 3-0 Vicryl and 4-0 Monocryl for the skin.  He tolerated the procedure well, all counts were correct and he was brought to PACU in stable condition.    COMPLICATIONS: None  CONDITION: Stable  Norman GORMAN Serve MD [facility] Phone Number: [phone] 01/18/2024 11:06 AM

## 2024-01-18 NOTE — Anesthesia Procedure Notes (Signed)
 Anesthesia Regional Block: Supraclavicular block   Pre-Anesthetic Checklist: , timeout performed,  Correct Patient, Correct Site, Correct Laterality,  Correct Procedure, Correct Position, site marked,  Risks and benefits discussed,  Surgical consent,  Pre-op  evaluation,  At surgeon's request and post-op pain management  Laterality: Left  Prep: chloraprep       Needles:  Injection technique: Single-shot  Needle Type: Echogenic Stimulator Needle     Needle Length: 9cm  Needle Gauge: 21     Additional Needles:   Procedures:,,,, ultrasound used (permanent image in chart),,    Narrative:  Start time: 01/18/2024 9:30 AM End time: 01/18/2024 9:40 AM Injection made incrementally with aspirations every 5 mL.  Performed by: Personally  Anesthesiologist: Erma Thom SAUNDERS, MD  Additional Notes: Discussed risks and benefits of the nerve block in detail, including but not limited vascular injury, permanent nerve damage and infection.   Patient tolerated the procedure well. Local anesthetic introduced in an incremental fashion under minimal resistance after negative aspirations. No paresthesias were elicited. After completion of the procedure, no acute issues were identified and patient continued to be monitored by RN.

## 2024-01-20 ENCOUNTER — Other Ambulatory Visit: Payer: Self-pay

## 2024-01-20 ENCOUNTER — Other Ambulatory Visit: Payer: Self-pay | Admitting: Pharmacy Technician

## 2024-01-24 ENCOUNTER — Encounter (HOSPITAL_COMMUNITY): Payer: Self-pay | Admitting: Vascular Surgery

## 2024-01-25 ENCOUNTER — Other Ambulatory Visit: Payer: Self-pay

## 2024-01-25 DIAGNOSIS — N186 End stage renal disease: Secondary | ICD-10-CM

## 2024-01-31 NOTE — Progress Notes (Unsigned)
 HPI: Jerry Edwards is a 53 y.o. male who presents to the [facility] pharmacy clinic for Hepatitis C follow-up.  Medication: Mavyret  x8 weeks  Start Date: 12/01/2023  Hepatitis C Genotype: 1b  Fibrosis Score: F3  Hepatitis C RNA: 377,000 (10/05/2023), <15 undetectable (01/04/2024)  Referring ID Provider: Dr. 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   Nutritional counseling 04/27/2018   Essential hypertension 09/20/2017   EKG abnormalities    Atypical chest pain 10/12/2016   Morbid obesity (HCC) 06/08/2016   Acute on chronic respiratory failure with hypoxia (HCC) 03/05/2016   Obesity hypoventilation syndrome (HCC) 02/29/2016   Obstructive sleep apnea 02/29/2016   Respiratory acidosis    NICM (nonischemic cardiomyopathy) (HCC) 02/22/2016   Chronic systolic heart failure (HCC) 05/31/2015   Hypertensive heart disease 05/01/2015   Elevated troponin 03/28/2015   Acute on chronic systolic heart failure (HCC) 04/08/2013    Patient's Medications  New Prescriptions   No medications on file  Previous Medications   ASPIRIN  81 MG TABLET    Take 1 tablet (81 mg total) by mouth daily.   CARVEDILOL  (COREG ) 3.125 MG TABLET    Take 1 tablet (3.125 mg total) by mouth 2 (two) times daily with a meal.   CYCLOBENZAPRINE  (FLEXERIL ) 5 MG TABLET    Take 1 tablet (5 mg total) by mouth 2 (two) times daily as needed for muscle spasms.   FUROSEMIDE  (LASIX ) 80 MG  TABLET    Take 80 mg on non dialysis days   GLECAPREVIR -PIBRENTASVIR  (MAVYRET ) 100-40 MG TABS    Take 3 tablets by mouth daily. Take 3 tablets by mouth daily with a meal.   OLOPATADINE  HCL (PATADAY  OP)    Place 1 drop into both eyes in the morning and at bedtime.   ONDANSETRON  (ZOFRAN ) 4 MG TABLET    Take 1 tablet (4 mg total) by mouth every 8 (eight) hours as needed for nausea or vomiting.   OXYGEN     Inhale 2 L into the lungs as needed (shortness of breath).   POLYETHYLENE GLYCOL POWDER (GLYCOLAX /MIRALAX ) 17 GM/SCOOP POWDER    Take 1 capful (17 g) with water  by mouth daily as needed for constipation.   SEVELAMER  CARBONATE (RENVELA ) 800 MG TABLET    Take 2 tablets (1,600 mg total) by mouth 3 (three) times daily with meals.   TRAMADOL  (ULTRAM ) 50 MG TABLET    Take 1 tablet (50 mg total) by mouth every 12 (twelve) hours as needed for severe pain (pain score 7-10).  Modified Medications   No medications on file  Discontinued Medications   No medications on file    Labs: Hepatitis C Lab Results  Component Value Date   HCVGENOTYPE 1b 11/02/2023   HCVRNAPCRQN <15 NOT DETECTED 01/04/2024   FIBROSTAGE F3 11/02/2023   Hepatitis B Lab Results  Component Value Date   HEPBSAG NON REACTIVE 06/08/2023   Hepatitis A No results found for: HAV HIV Lab Results  Component Value Date  HIV Non Reactive 06/06/2023   HIV Non Reactive 02/09/2021   HIV Non Reactive 10/12/2016   HIV NON REACTIVE 05/06/2012   Lab Results  Component Value Date   CREATININE 4.30 (H) 01/18/2024   CREATININE 4.47 (H) 01/04/2024   CREATININE 4.36 (H) 10/05/2023   CREATININE 7.48 (H) 06/18/2023   CREATININE 5.41 (H) 06/17/2023   Lab Results  Component Value Date   AST 12 01/04/2024   AST 30 11/02/2023   AST 22 10/05/2023   ALT 8 (L) 01/04/2024   ALT 23 11/02/2023   ALT 22 11/02/2023   INR 1.1 11/02/2023   INR 1.0 02/08/2021   INR 0.98 03/28/2015    Assessment: Jerry Edwards presents today with his Mother for  end of therapy follow up for his chronic Hepatitis C infection. Jerry Edwards started Mavyret  on 12/07/2023. He reports no missed doses and takes all three tablets together with lunch. He reports taking his last dose of Mavyret  yesterday (01/31/2024). He reports feeling better overall and has tolerated Mavyret  with no side effects. Noted that his bilirubin was slightly elevated during 1 month follow up (01/04/2024), therefore, will repeat CMP today to reassess bilirubin. Will also order hepatitis A antibody to assess immunity.   Labs: HCV RNA, CMP, hepatitis A antibody   Eligible vaccinations: Covid, Shingrix 1/2, Heplisav 1/2 - Patient agrees to Heplisav 1/2 vaccine today   Plan: - Check HCV RNA, CMP, and hepatitis A antibody today - Heplisav 1/2 vaccine administered in left deltoid  - Follow up with Cathlyn on 05/16/2024 to assess SVR  Feliciano Close, PharmD PGY2 Infectious Diseases Pharmacy Resident

## 2024-02-01 ENCOUNTER — Other Ambulatory Visit: Payer: Self-pay

## 2024-02-01 ENCOUNTER — Ambulatory Visit: Admitting: Pharmacist

## 2024-02-01 DIAGNOSIS — Z23 Encounter for immunization: Secondary | ICD-10-CM | POA: Diagnosis not present

## 2024-02-01 DIAGNOSIS — Z0000 Encounter for general adult medical examination without abnormal findings: Secondary | ICD-10-CM

## 2024-02-01 DIAGNOSIS — B182 Chronic viral hepatitis C: Secondary | ICD-10-CM

## 2024-02-03 ENCOUNTER — Ambulatory Visit: Payer: Self-pay

## 2024-02-03 LAB — COMPREHENSIVE METABOLIC PANEL WITH GFR
AG Ratio: 0.9 (calc) — ABNORMAL LOW (ref 1.0–2.5)
ALT: 5 U/L — ABNORMAL LOW (ref 9–46)
AST: 14 U/L (ref 10–35)
Albumin: 3.3 g/dL — ABNORMAL LOW (ref 3.6–5.1)
Alkaline phosphatase (APISO): 66 U/L (ref 35–144)
BUN/Creatinine Ratio: 9 (calc) (ref 6–22)
BUN: 41 mg/dL — ABNORMAL HIGH (ref 7–25)
CO2: 31 mmol/L (ref 20–32)
Calcium: 9.3 mg/dL (ref 8.6–10.3)
Chloride: 96 mmol/L — ABNORMAL LOW (ref 98–110)
Creat: 4.72 mg/dL — ABNORMAL HIGH (ref 0.70–1.30)
Globulin: 3.8 g/dL — ABNORMAL HIGH (ref 1.9–3.7)
Glucose, Bld: 85 mg/dL (ref 65–99)
Potassium: 3.7 mmol/L (ref 3.5–5.3)
Sodium: 140 mmol/L (ref 135–146)
Total Bilirubin: 1.9 mg/dL — ABNORMAL HIGH (ref 0.2–1.2)
Total Protein: 7.1 g/dL (ref 6.1–8.1)
eGFR: 14 mL/min/1.73m2 — ABNORMAL LOW (ref >=60)

## 2024-02-03 LAB — HEPATITIS A ANTIBODY, TOTAL: Hepatitis A AB,Total: REACTIVE — AB

## 2024-02-03 LAB — HEPATITIS C RNA QUANTITATIVE
HCV Quantitative Log: <1.18 {Log_IU}/mL
HCV RNA, PCR, QN: <15 [IU]/mL

## 2024-02-03 NOTE — Progress Notes (Signed)
 Hello Swain, Acree this week in clinic for Mavyret  end of therapy follow up visit. HCV RNA level still in process and ALT/AST appear stable. Wanted to bring up the total bilirubin continued to numerically increase but may decrease now that Mavyret  has been completed. Do you want a  repeat CMP prior to their visit with you in February.   Thank you! Feliciano

## 2024-02-07 ENCOUNTER — Telehealth: Payer: Self-pay

## 2024-02-10 ENCOUNTER — Telehealth: Payer: Self-pay

## 2024-02-10 NOTE — Telephone Encounter (Signed)
 Attempted to reach out to patient to try and schedule a lab visit in December to assess CMP and trend total bilirubin. Left voicemail for patient to call back. Will try to message patient via Mychart.   Feliciano Close, PharmD PGY2 Infectious Diseases Pharmacy Resident

## 2024-03-02 ENCOUNTER — Ambulatory Visit (HOSPITAL_COMMUNITY)
Admission: RE | Admit: 2024-03-02 | Discharge: 2024-03-02 | Disposition: A | Source: Ambulatory Visit | Attending: Vascular Surgery

## 2024-03-02 ENCOUNTER — Ambulatory Visit

## 2024-03-02 ENCOUNTER — Encounter: Payer: Self-pay | Admitting: Physician Assistant

## 2024-03-02 VITALS — BP 119/72 | HR 75 | Temp 97.7°F

## 2024-03-02 DIAGNOSIS — Z992 Dependence on renal dialysis: Secondary | ICD-10-CM | POA: Insufficient documentation

## 2024-03-02 DIAGNOSIS — N186 End stage renal disease: Secondary | ICD-10-CM | POA: Insufficient documentation

## 2024-03-02 NOTE — Progress Notes (Signed)
 POST OPERATIVE OFFICE NOTE    CC:  F/u for surgery  HPI:  This is a 53 y.o. male who is s/p left BC AVF on 06/16/2023 by Dr. Gretta.   He subsequently underwent fistulogram (12/30/2023) and ligation of branches on 01/18/2024 by Dr. Pearline.   He has TDC that was placed 06/08/2023 by Dr. Melia.    He previously underwent fistulogram due to multiple infiltrations and central venous system and fistula widely patent.  Underwent branch ligation.    He returns today for follow up.    Pt states he does not have pain/numbness in the left hand.    The pt is on dialysis M/W/F at [facility] location.   Allergies[1]  Current Outpatient Medications  Medication Sig Dispense Refill   aspirin  81 MG tablet Take 1 tablet (81 mg total) by mouth daily. (Patient taking differently: Take 81 mg by mouth every Tuesday, Thursday, Saturday, and "Sunday at 6 PM. Non-dialysis days)     carvedilol (COREG) 3.125 MG tablet Take 1 tablet (3.125 mg total) by mouth 2 (two) times daily with a meal. 60 tablet 11   cyclobenzaprine (FLEXERIL) 5 MG tablet Take 1 tablet (5 mg total) by mouth 2 (two) times daily as needed for muscle spasms. 20 tablet 0   furosemide (LASIX) 80 MG tablet Take 80 mg on non dialysis days (Patient taking differently: Take 80 mg by mouth every Tuesday, Thursday, Saturday, and Sunday at 6 PM.) 45 tablet 3   Olopatadine HCl (PATADAY OP) Place 1 drop into both eyes in the morning and at bedtime.     ondansetron (ZOFRAN) 4 MG tablet Take 1 tablet (4 mg total) by mouth every 8 (eight) hours as needed for nausea or vomiting. 20 tablet 0   OXYGEN Inhale 2 L into the lungs as needed (shortness of breath).     polyethylene glycol powder (GLYCOLAX/MIRALAX) 17 GM/SCOOP powder Take 1 capful (17 g) with water by mouth daily as needed for constipation. 238 g 0   sevelamer carbonate (RENVELA) 800 MG tablet Take 2 tablets (1,600 mg total) by mouth 3 (three) times daily with meals. 90 tablet 0   traMADol (ULTRAM) 50  MG tablet Take 1 tablet (50 mg total) by mouth every 12 (twelve) hours as needed for severe pain (pain score 7-10). 6 tablet 0   No current facility-administered medications for this visit.     ROS:  See HPI  Physical Exam:  Today's Vitals   03/02/24 1410  BP: 119/72  Pulse: 75  Temp: 97.7 F (36.5 C)  TempSrc: Temporal  PainSc: 0-No pain   There is no height or weight on file to calculate BMI.   Incision:  well healed Extremities:   There is a palpable left radial pulse.   Motor and sensory are in tact.   There  a thrill/bruit present.  Access is  easily palpable   Dialysis Duplex on 01/18/2024: Findings:  +--------------------+----------+-----------------+--------+  AVF                PSV (cm/s)Flow Vol (mL/min)Comments  +--------------------+----------+-----------------+--------+  Native artery inflow   144           810                 +--------------------+----------+-----------------+--------+  AVF Anastomosis        50" 3                               +--------------------+----------+-----------------+--------+     +------------+----------+-------------+----------+--------+  OUTFLOW VEINPSV (cm/s)Diameter (cm)Depth (cm)Describe  +------------+----------+-------------+----------+--------+  Prox UA         78        0.76        0.35             +------------+----------+-------------+----------+--------+  Mid UA          45        0.99        0.35             +------------+----------+-------------+----------+--------+  Dist UA         61        1.13        0.30             +------------+----------+-------------+----------+--------+  AC Fossa       788        0.37        0.52             +------------+----------+-------------+----------+--------+    Assessment/Plan:  This is a 53 y.o. male who is s/p: left BC AVF on 06/16/2023 by Dr. Gretta.   He subsequently underwent fistulogram (12/30/2023) and ligation of branches on  01/18/2024 by Dr. Pearline.    -the pt does not have evidence of steal. -pt's access can be used now. -pt seen with Dr. Lanis.  Ok to access fistula now as it has matured nicely.  If trouble cannulating, can schedule for superficialization of fistula as it is also tortuous.  If they cannot access the fistula, instructed pt to call to get scheduled and would not need to come in for office visit.  -if pt has tunneled catheter, this can be removed at the discretion of the dialysis center once the pt's access has been successfully cannulated to their satisfaction.  -discussed with pt that access does not last forever and will need intervention or even new access at some point.  -the pt will follow up as needed.    Lucie Apt, [facility] [phone]  Clinic MD:  Lanis     [1]  Allergies Allergen Reactions   Fish Allergy Anaphylaxis   Other Anaphylaxis and Other (See Comments)    NO NUTS!!!!!! Peanut are legumes!!   No red meat- Does not eat   Peanuts [Peanut Oil] Anaphylaxis   Shellfish Allergy Anaphylaxis    Other Reaction(s): Unknown   Bovine (Beef) Protein-Containing Drug Products    Fish Protein-Containing Drug Products    Latex Itching   Porcine (Pork) Protein-Containing Drug Products Other (See Comments)    Does not eat- religious reasons

## 2024-03-07 ENCOUNTER — Telehealth

## 2024-03-07 NOTE — Patient Instructions (Signed)
 [name] - I am sorry I was unable to reach you today for our scheduled appointment. I work with Leavy Rode, [place], PA-C and am calling to support your healthcare needs. Please contact me at [phone] at your earliest convenience. I look forward to speaking with you soon.   Thank you,  Santana Stamp BSN, CCM [place]  [facility] RN Care Manager Direct Dial : [phone]  Fax: [phone]

## 2024-04-06 ENCOUNTER — Ambulatory Visit

## 2024-04-06 VITALS — BP 119/74 | HR 74 | Temp 98.4°F | Resp 16

## 2024-04-06 DIAGNOSIS — Z79899 Other long term (current) drug therapy: Secondary | ICD-10-CM

## 2024-04-06 DIAGNOSIS — H02843 Edema of right eye, unspecified eyelid: Secondary | ICD-10-CM

## 2024-04-06 MED ORDER — TRAMADOL HCL 50 MG PO TABS: 50.0000 mg | ORAL_TABLET | Freq: Two times a day (BID) | ORAL | 0 refills | Status: AC | PRN

## 2024-04-06 NOTE — Progress Notes (Signed)
" ° ° ° °  Patient ID: Selden Noteboom, male    DOB: 1970/04/05  MRN: [id number]  CC: Medical Management of Chronic Issues   Subjective: Rock Sobol is a 54 y.o. male with past medical history of CHF, ESRD on dialysis who presents to clinic for follow up on chronic health issues.  Patient's main concern is right swollen eyelid that has been present for the past 5 years, reports that it has been more bothersome recently and its affecting his vision, and is painful and sore.   Allergies[1]  ROS: Review of Systems Negative except as stated above  PHYSICAL EXAM: BP 119/74   Pulse 74   Temp 98.4 F (36.9 C) (Oral)   Resp 16   SpO2 (!) 82% Comment: with deep breaths  Physical Exam  General: well-appearing, no acute distress Skin: no jaundice, rashes, or lesions Eyes: Right eye edema and erythema of upper and lower eyelid. Cardiovascular: regular heart rate and rhythm, normal S1/S2, no murmurs, gallops, or rubs, peripheral pulses 2+ bilaterally Chest: no skeletal deformity, lungs clear to auscultation bilaterally, equal breath sounds bilaterally Extremities: no peripheral edema  ASSESSMENT AND PLAN:  1. Swollen eyelid, right (Primary) - Patient previously advised that he was not a good surgical candidate to repair right eyelids due to co morbidities. Symptoms worsening, warranting referral for evaluation. - Ambulatory referral to Ophthalmology  2. Medication management - traMADol  (ULTRAM ) 50 MG tablet; Take 1 tablet (50 mg total) by mouth every 12 (twelve) hours as needed for severe pain (pain score 7-10).  Dispense: 10 tablet; Refill: 0   Patient was given the opportunity to ask questions.  Patient verbalized understanding of the plan and was able to repeat key elements of the plan.    Orders Placed This Encounter  Procedures   Ambulatory referral to Ophthalmology     Requested Prescriptions   Signed Prescriptions Disp Refills   traMADol  (ULTRAM ) 50 MG tablet 10 tablet 0     Sig: Take 1 tablet (50 mg total) by mouth every 12 (twelve) hours as needed for severe pain (pain score 7-10).    Return in about 6 months (around 10/04/2024) for follow-up.  Sula Cower Nayeliz Hipp, PA-C      [1]  Allergies Allergen Reactions   Fish Allergy Anaphylaxis   Other Anaphylaxis and Other (See Comments)    NO NUTS!!!!!! Peanut are legumes!!   No red meat- Does not eat   Peanuts [Peanut Oil] Anaphylaxis   Shellfish Allergy Anaphylaxis    Other Reaction(s): Unknown   Bovine (Beef) Protein-Containing Drug Products    Fish Protein-Containing Drug Products    Latex Itching   Porcine (Pork) Protein-Containing Drug Products Other (See Comments)    Does not eat- religious reasons   "

## 2024-04-12 ENCOUNTER — Other Ambulatory Visit (HOSPITAL_COMMUNITY): Payer: Self-pay | Admitting: Nephrology

## 2024-04-12 DIAGNOSIS — N186 End stage renal disease: Secondary | ICD-10-CM

## 2024-04-20 ENCOUNTER — Ambulatory Visit (HOSPITAL_COMMUNITY)
Admission: RE | Admit: 2024-04-20 | Discharge: 2024-04-20 | Disposition: A | Discharge status: Home / Self Care | Source: Ambulatory Visit | Attending: Nephrology | Admitting: Nephrology

## 2024-04-20 DIAGNOSIS — N186 End stage renal disease: Secondary | ICD-10-CM | POA: Diagnosis not present

## 2024-04-20 DIAGNOSIS — Z992 Dependence on renal dialysis: Secondary | ICD-10-CM

## 2024-04-20 DIAGNOSIS — Z4901 Encounter for fitting and adjustment of extracorporeal dialysis catheter: Secondary | ICD-10-CM | POA: Insufficient documentation

## 2024-04-20 MED ORDER — LIDOCAINE-EPINEPHRINE 1 %-1:100000 IJ SOLN
20.0000 mL | Freq: Once | INTRAMUSCULAR | Status: AC
  Administered 2024-04-20: 10 mL

## 2024-04-20 MED ORDER — LIDOCAINE-EPINEPHRINE 1 %-1:100000 IJ SOLN
INTRAMUSCULAR | Status: AC
  Filled 2024-04-20: qty 20

## 2024-04-28 ENCOUNTER — Encounter (HOSPITAL_COMMUNITY): Payer: Self-pay | Admitting: Surgery

## 2024-05-16 ENCOUNTER — Ambulatory Visit: Admitting: Family

## 2024-10-05 ENCOUNTER — Ambulatory Visit: Payer: Self-pay

## 2025-01-25 ENCOUNTER — Ambulatory Visit
